# Patient Record
Sex: Male | Born: 1950 | Race: White | Hispanic: No | Marital: Married | Smoking: Never smoker
Health system: Southern US, Community
[De-identification: ages and names within clinical notes are randomized; demographics above are authoritative.]

## PROBLEM LIST (undated history)

## (undated) ENCOUNTER — Encounter

## (undated) ENCOUNTER — Telehealth

## (undated) ENCOUNTER — Encounter: Attending: Foot and Ankle Surgery | Primary: Foot and Ankle Surgery

## (undated) ENCOUNTER — Ambulatory Visit: Attending: Clinical | Primary: Clinical

## (undated) ENCOUNTER — Ambulatory Visit: Payer: Medicare (Managed Care) | Attending: Orthopaedic Surgery | Primary: Orthopaedic Surgery

## (undated) ENCOUNTER — Ambulatory Visit: Payer: MEDICARE | Attending: Clinical | Primary: Clinical

## (undated) ENCOUNTER — Ambulatory Visit: Payer: MEDICARE

## (undated) ENCOUNTER — Ambulatory Visit

## (undated) ENCOUNTER — Telehealth: Attending: Clinical | Primary: Clinical

## (undated) ENCOUNTER — Telehealth: Attending: Adult Health | Primary: Adult Health

## (undated) ENCOUNTER — Encounter: Attending: Family Medicine | Primary: Family Medicine

## (undated) ENCOUNTER — Other Ambulatory Visit

## (undated) ENCOUNTER — Ambulatory Visit: Payer: Medicare (Managed Care)

## (undated) ENCOUNTER — Ambulatory Visit: Payer: MEDICARE | Attending: Cardiovascular Disease | Primary: Cardiovascular Disease

## (undated) ENCOUNTER — Ambulatory Visit: Attending: Nurse Practitioner | Primary: Nurse Practitioner

## (undated) ENCOUNTER — Encounter: Attending: Orthopaedic Surgery | Primary: Orthopaedic Surgery

## (undated) ENCOUNTER — Inpatient Hospital Stay

## (undated) ENCOUNTER — Encounter: Attending: Internal Medicine | Primary: Internal Medicine

## (undated) ENCOUNTER — Ambulatory Visit
Payer: MEDICARE | Attending: Rehabilitative and Restorative Service Providers" | Primary: Rehabilitative and Restorative Service Providers"

## (undated) ENCOUNTER — Ambulatory Visit: Payer: MEDICARE | Attending: Family Medicine | Primary: Family Medicine

## (undated) ENCOUNTER — Encounter: Attending: Adult Health | Primary: Adult Health

## (undated) ENCOUNTER — Telehealth: Attending: Internal Medicine | Primary: Internal Medicine

## (undated) ENCOUNTER — Encounter
Attending: Student in an Organized Health Care Education/Training Program | Primary: Student in an Organized Health Care Education/Training Program

## (undated) ENCOUNTER — Ambulatory Visit: Payer: Medicare (Managed Care) | Attending: Adult Health | Primary: Adult Health

## (undated) ENCOUNTER — Ambulatory Visit: Payer: MEDICARE | Attending: Foot and Ankle Surgery | Primary: Foot and Ankle Surgery

## (undated) ENCOUNTER — Encounter: Attending: Clinical | Primary: Clinical

## (undated) ENCOUNTER — Ambulatory Visit: Payer: MEDICARE | Attending: Ophthalmology | Primary: Ophthalmology

## (undated) ENCOUNTER — Ambulatory Visit
Attending: Thoracic Surgery (Cardiothoracic Vascular Surgery) | Primary: Thoracic Surgery (Cardiothoracic Vascular Surgery)

## (undated) ENCOUNTER — Ambulatory Visit: Payer: MEDICARE | Attending: Internal Medicine | Primary: Internal Medicine

## (undated) ENCOUNTER — Ambulatory Visit
Payer: Medicare (Managed Care) | Attending: Rehabilitative and Restorative Service Providers" | Primary: Rehabilitative and Restorative Service Providers"

## (undated) ENCOUNTER — Institutional Professional Consult (permissible substitution): Payer: MEDICARE

## (undated) ENCOUNTER — Ambulatory Visit: Payer: MEDICARE | Attending: Medical | Primary: Medical

## (undated) ENCOUNTER — Encounter
Attending: Rehabilitative and Restorative Service Providers" | Primary: Rehabilitative and Restorative Service Providers"

## (undated) ENCOUNTER — Encounter: Attending: Ophthalmology | Primary: Ophthalmology

## (undated) ENCOUNTER — Encounter: Attending: Family | Primary: Family

## (undated) ENCOUNTER — Encounter: Attending: Physician Assistant | Primary: Physician Assistant

## (undated) ENCOUNTER — Encounter: Attending: Physical Medicine & Rehabilitation | Primary: Physical Medicine & Rehabilitation

## (undated) DIAGNOSIS — M5137 Other intervertebral disc degeneration, lumbosacral region: Secondary | ICD-10-CM

## (undated) DIAGNOSIS — E785 Hyperlipidemia, unspecified: Secondary | ICD-10-CM

## (undated) DIAGNOSIS — E876 Hypokalemia: Secondary | ICD-10-CM

## (undated) DIAGNOSIS — M51379 Other intervertebral disc degeneration, lumbosacral region without mention of lumbar back pain or lower extremity pain: Secondary | ICD-10-CM

## (undated) DIAGNOSIS — I2510 Atherosclerotic heart disease of native coronary artery without angina pectoris: Secondary | ICD-10-CM

## (undated) DIAGNOSIS — I10 Essential (primary) hypertension: Secondary | ICD-10-CM

## (undated) DIAGNOSIS — R319 Hematuria, unspecified: Secondary | ICD-10-CM

## (undated) DIAGNOSIS — E119 Type 2 diabetes mellitus without complications: Secondary | ICD-10-CM

## (undated) DIAGNOSIS — G4700 Insomnia, unspecified: Secondary | ICD-10-CM

## (undated) HISTORY — DX: Insomnia, unspecified: G47.00

## (undated) HISTORY — DX: Other intervertebral disc degeneration, lumbosacral region: M51.37

## (undated) HISTORY — DX: Hyperlipidemia, unspecified: E78.5

## (undated) HISTORY — PX: FOOT SURGERY: SHX648

## (undated) HISTORY — PX: OTHER SURGICAL HISTORY: SHX169

## (undated) HISTORY — DX: Atherosclerotic heart disease of native coronary artery without angina pectoris: I25.10

## (undated) HISTORY — DX: Hypokalemia: E87.6

## (undated) HISTORY — DX: Essential (primary) hypertension: I10

## (undated) HISTORY — PX: SPINAL FUSION: SHX223

## (undated) HISTORY — DX: Other intervertebral disc degeneration, lumbosacral region without mention of lumbar back pain or lower extremity pain: M51.379

## (undated) HISTORY — PX: SHOULDER SURGERY: SHX246

## (undated) HISTORY — DX: Type 2 diabetes mellitus without complications: E11.9

## (undated) HISTORY — DX: Hematuria, unspecified: R31.9

## (undated) HISTORY — PX: ANGIOPLASTY: SHX39

---
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
## 2004-04-09 ENCOUNTER — Other Ambulatory Visit: Payer: Self-pay

## 2004-04-09 ENCOUNTER — Inpatient Hospital Stay: Payer: Self-pay | Admitting: Internal Medicine

## 2004-04-10 ENCOUNTER — Other Ambulatory Visit: Payer: Self-pay

## 2004-04-11 ENCOUNTER — Other Ambulatory Visit: Payer: Self-pay

## 2006-06-17 ENCOUNTER — Other Ambulatory Visit: Payer: Self-pay

## 2006-06-17 ENCOUNTER — Emergency Department: Payer: Self-pay | Admitting: Internal Medicine

## 2006-07-30 ENCOUNTER — Ambulatory Visit: Payer: Self-pay | Admitting: Pain Medicine

## 2006-08-06 ENCOUNTER — Ambulatory Visit: Payer: Self-pay | Admitting: Pain Medicine

## 2006-09-18 ENCOUNTER — Ambulatory Visit: Payer: Self-pay | Admitting: Pain Medicine

## 2006-09-24 ENCOUNTER — Ambulatory Visit: Payer: Self-pay | Admitting: Pain Medicine

## 2006-10-23 ENCOUNTER — Ambulatory Visit: Payer: Self-pay | Admitting: Pain Medicine

## 2006-10-29 ENCOUNTER — Ambulatory Visit: Payer: Self-pay | Admitting: Pain Medicine

## 2006-11-12 ENCOUNTER — Ambulatory Visit: Payer: Self-pay | Admitting: Pain Medicine

## 2006-11-27 ENCOUNTER — Ambulatory Visit: Payer: Self-pay | Admitting: Pain Medicine

## 2007-01-22 ENCOUNTER — Ambulatory Visit: Payer: Self-pay | Admitting: Pain Medicine

## 2007-02-04 ENCOUNTER — Ambulatory Visit: Payer: Self-pay | Admitting: Pain Medicine

## 2007-02-21 ENCOUNTER — Ambulatory Visit: Payer: Self-pay | Admitting: Pain Medicine

## 2007-03-13 ENCOUNTER — Ambulatory Visit: Payer: Self-pay | Admitting: Pain Medicine

## 2007-04-16 ENCOUNTER — Ambulatory Visit: Payer: Self-pay | Admitting: Pain Medicine

## 2007-05-16 ENCOUNTER — Ambulatory Visit: Payer: Self-pay | Admitting: Pain Medicine

## 2007-06-20 ENCOUNTER — Ambulatory Visit: Payer: Self-pay | Admitting: Pain Medicine

## 2007-07-18 ENCOUNTER — Ambulatory Visit: Payer: Self-pay | Admitting: Pain Medicine

## 2007-09-03 ENCOUNTER — Ambulatory Visit: Payer: Self-pay | Admitting: Pain Medicine

## 2007-10-01 ENCOUNTER — Ambulatory Visit: Payer: Self-pay | Admitting: Pain Medicine

## 2007-10-31 ENCOUNTER — Ambulatory Visit: Payer: Self-pay | Admitting: Pain Medicine

## 2008-03-10 ENCOUNTER — Ambulatory Visit: Payer: Self-pay | Admitting: Specialist

## 2008-03-17 ENCOUNTER — Ambulatory Visit: Payer: Self-pay | Admitting: Specialist

## 2008-09-07 ENCOUNTER — Inpatient Hospital Stay: Payer: Self-pay | Admitting: Internal Medicine

## 2011-02-13 ENCOUNTER — Ambulatory Visit: Payer: Self-pay | Admitting: Orthopedic Surgery

## 2011-03-23 ENCOUNTER — Telehealth: Payer: Self-pay | Admitting: Pulmonary Disease

## 2011-03-23 NOTE — Telephone Encounter (Signed)
Error

## 2011-07-31 ENCOUNTER — Ambulatory Visit: Payer: Self-pay | Admitting: Cardiovascular Disease

## 2011-08-03 ENCOUNTER — Ambulatory Visit: Payer: Self-pay | Admitting: Cardiovascular Disease

## 2011-08-10 ENCOUNTER — Ambulatory Visit: Payer: Self-pay | Admitting: Cardiovascular Disease

## 2012-04-05 ENCOUNTER — Ambulatory Visit: Payer: Self-pay | Admitting: Family Medicine

## 2012-06-27 ENCOUNTER — Ambulatory Visit: Payer: Self-pay | Admitting: Family Medicine

## 2012-08-29 ENCOUNTER — Ambulatory Visit: Payer: Self-pay | Admitting: Family Medicine

## 2012-12-24 ENCOUNTER — Ambulatory Visit: Payer: Self-pay | Admitting: Cardiovascular Disease

## 2012-12-24 LAB — BASIC METABOLIC PANEL
Anion Gap: 7 (ref 7–16)
Calcium, Total: 9.1 mg/dL (ref 8.5–10.1)
Chloride: 105 mmol/L (ref 98–107)
EGFR (Non-African Amer.): >60
Glucose: 123 mg/dL — ABNORMAL HIGH (ref 65–99)
Osmolality: 278 (ref 275–301)
Potassium: 3.8 mmol/L (ref 3.5–5.1)
Sodium: 138 mmol/L (ref 136–145)

## 2012-12-24 LAB — CBC WITH DIFFERENTIAL/PLATELET
Basophil #: 0 10*3/uL (ref 0.0–0.1)
Basophil %: 0.3 %
Eosinophil #: 0.1 10*3/uL (ref 0.0–0.7)
Eosinophil %: 1.3 %
HCT: 41.8 % (ref 40.0–52.0)
HGB: 14.7 g/dL (ref 13.0–18.0)
Lymphocyte %: 25.8 %
MCHC: 35.2 g/dL (ref 32.0–36.0)
MCV: 97 fL (ref 80–100)
Monocyte %: 10.6 %
Neutrophil #: 3.8 10*3/uL (ref 1.4–6.5)
Platelet: 155 10*3/uL (ref 150–440)
RBC: 4.31 10*6/uL — ABNORMAL LOW (ref 4.40–5.90)

## 2013-02-12 ENCOUNTER — Ambulatory Visit: Payer: Self-pay | Admitting: Orthopedic Surgery

## 2013-04-07 ENCOUNTER — Ambulatory Visit: Payer: Self-pay | Admitting: Family Medicine

## 2013-05-12 ENCOUNTER — Emergency Department: Payer: Self-pay | Admitting: Emergency Medicine

## 2013-05-12 LAB — URINALYSIS, COMPLETE
BACTERIA: NONE SEEN
Bilirubin,UR: NEGATIVE
Glucose,UR: NEGATIVE mg/dL (ref 0–75)
Ketone: NEGATIVE
Leukocyte Esterase: NEGATIVE
Nitrite: NEGATIVE
PH: 5 (ref 4.5–8.0)
Protein: NEGATIVE
SPECIFIC GRAVITY: 1.01 (ref 1.003–1.030)
SQUAMOUS EPITHELIAL: NONE SEEN
WBC UR: <1 /HPF (ref 0–5)

## 2013-05-12 LAB — CBC WITH DIFFERENTIAL/PLATELET
BASOS ABS: 0 10*3/uL (ref 0.0–0.1)
Basophil %: 0.3 %
EOS ABS: 0 10*3/uL (ref 0.0–0.7)
Eosinophil %: 0.7 %
HCT: 44.4 % (ref 40.0–52.0)
HGB: 15.1 g/dL (ref 13.0–18.0)
LYMPHS PCT: 25.3 %
Lymphocyte #: 1.8 10*3/uL (ref 1.0–3.6)
MCH: 32.6 pg (ref 26.0–34.0)
MCHC: 34 g/dL (ref 32.0–36.0)
MCV: 96 fL (ref 80–100)
MONOS PCT: 10.8 %
Monocyte #: 0.8 x10 3/mm (ref 0.2–1.0)
NEUTROS PCT: 62.9 %
Neutrophil #: 4.5 10*3/uL (ref 1.4–6.5)
Platelet: 170 10*3/uL (ref 150–440)
RBC: 4.64 10*6/uL (ref 4.40–5.90)
RDW: 13.5 % (ref 11.5–14.5)
WBC: 7.1 10*3/uL (ref 3.8–10.6)

## 2013-05-12 LAB — COMPREHENSIVE METABOLIC PANEL
ALBUMIN: 3.8 g/dL (ref 3.4–5.0)
ALT: 25 U/L (ref 12–78)
Alkaline Phosphatase: 62 U/L
Anion Gap: 6 — ABNORMAL LOW (ref 7–16)
BUN: 13 mg/dL (ref 7–18)
Bilirubin,Total: 0.4 mg/dL (ref 0.2–1.0)
CHLORIDE: 102 mmol/L (ref 98–107)
CO2: 26 mmol/L (ref 21–32)
CREATININE: 1.01 mg/dL (ref 0.60–1.30)
Calcium, Total: 9.7 mg/dL (ref 8.5–10.1)
EGFR (African American): >60
EGFR (Non-African Amer.): >60
Glucose: 125 mg/dL — ABNORMAL HIGH (ref 65–99)
Osmolality: 270 (ref 275–301)
Potassium: 3.6 mmol/L (ref 3.5–5.1)
SGOT(AST): 18 U/L (ref 15–37)
Sodium: 134 mmol/L — ABNORMAL LOW (ref 136–145)
Total Protein: 7.5 g/dL (ref 6.4–8.2)

## 2013-05-12 LAB — LIPASE, BLOOD: Lipase: 160 U/L (ref 73–393)

## 2013-07-08 ENCOUNTER — Ambulatory Visit: Payer: Self-pay | Admitting: Gastroenterology

## 2013-07-10 LAB — PATHOLOGY REPORT

## 2013-10-07 ENCOUNTER — Ambulatory Visit: Payer: Self-pay | Admitting: Otolaryngology

## 2013-11-10 ENCOUNTER — Ambulatory Visit: Payer: Self-pay | Admitting: Specialist

## 2014-01-22 ENCOUNTER — Ambulatory Visit: Payer: Self-pay | Admitting: Internal Medicine

## 2014-10-16 ENCOUNTER — Other Ambulatory Visit: Payer: Self-pay

## 2014-10-16 NOTE — Telephone Encounter (Signed)
Pharmacy faxed request for Rx. Thanks

## 2014-11-25 ENCOUNTER — Other Ambulatory Visit: Payer: Self-pay | Admitting: Family Medicine

## 2015-01-21 ENCOUNTER — Other Ambulatory Visit: Payer: Self-pay | Admitting: Family Medicine

## 2016-01-10 IMAGING — MR MRI OF THE RIGHT SHOULDER WITHOUT CONTRAST
6 series · 40 of 40 positions shown · non-contrast
Comparison: Right shoulder radiographs 04/07/2013.

CLINICAL DATA: Right shoulder pain. History of prior rotator cuff
repair.

EXAM:
MRI OF THE RIGHT SHOULDER WITHOUT CONTRAST
TECHNIQUE: Multiplanar, multisequence MR imaging of the shoulder was performed.
No intravenous contrast was administered.

[Series 3: T2 fat-sat · axial · 4.0mm · 0.62mm/px · z∈[-24,+64]mm · 6 of 21 slices shown (1 of 4)]
[im 1/21]
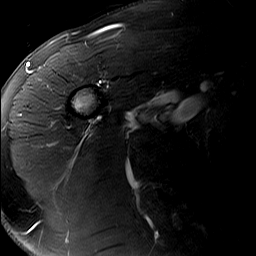
[im 5/21]
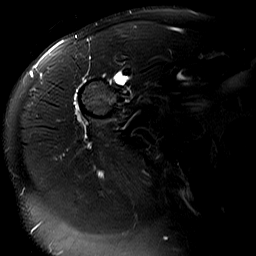
[im 9/21]
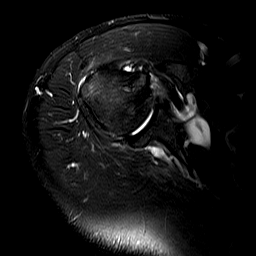
[im 13/21]
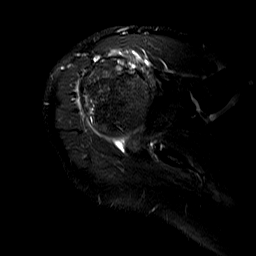
[im 17/21]
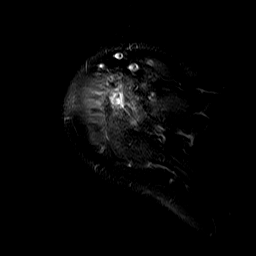
[im 21/21]
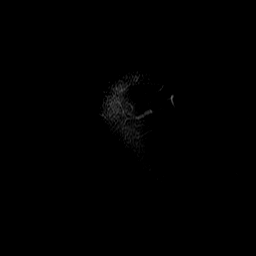

[Series 4: T2 fat-sat · coronal · 4.0mm · 0.62mm/px · 6 of 19 slices shown (2 of 4)]
[im 1/19]
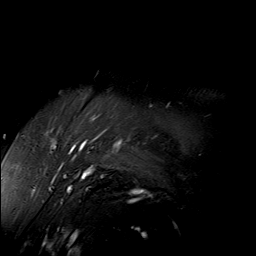
[im 4/19]
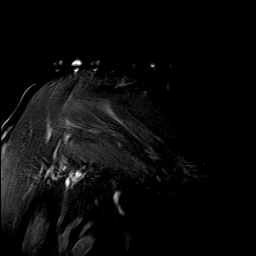
[im 8/19]
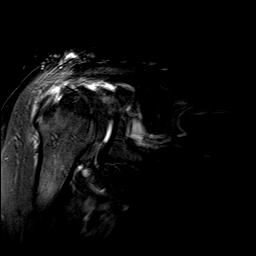
[im 11/19]
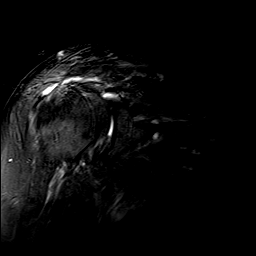
[im 15/19]
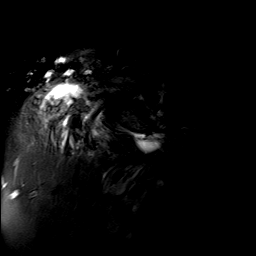
[im 19/19]
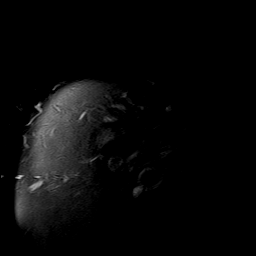

[Series 5: PD · coronal · 4.0mm · 0.62mm/px · 7 of 19 slices shown]
[im 1/19]
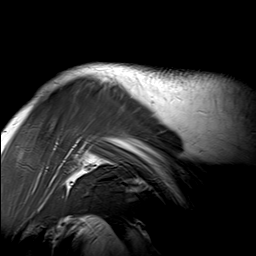
[im 4/19]
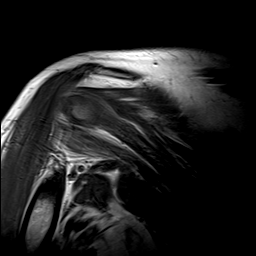
[im 7/19]
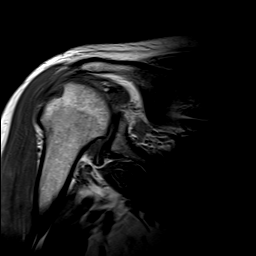
[im 10/19]
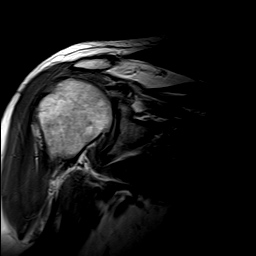
[im 13/19]
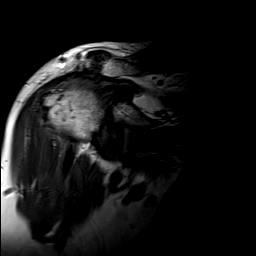
[im 16/19]
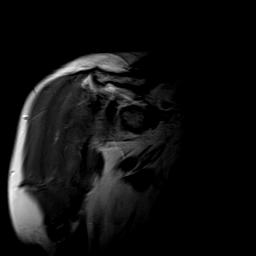
[im 19/19]
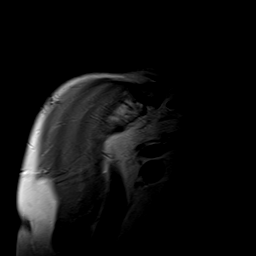

[Series 6: T2 fat-sat · coronal · 4.0mm · 0.62mm/px · 7 of 19 slices shown (3 of 4)]
[im 1/19]
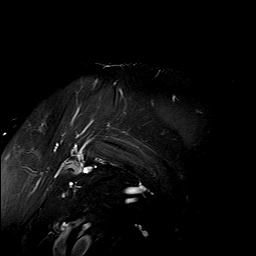
[im 4/19]
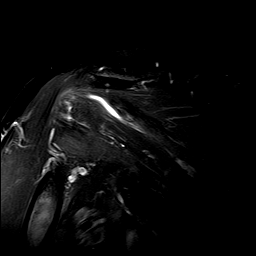
[im 7/19]
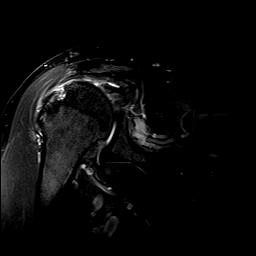
[im 10/19]
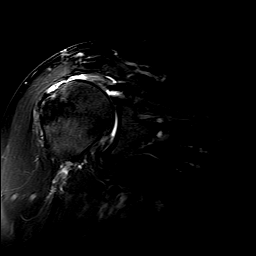
[im 13/19]
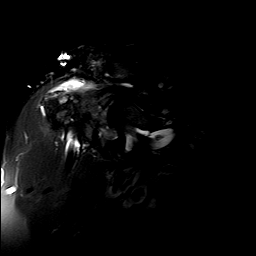
[im 16/19]
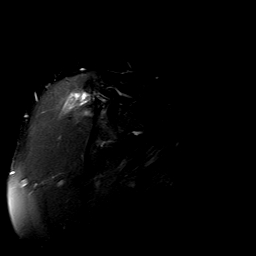
[im 19/19]
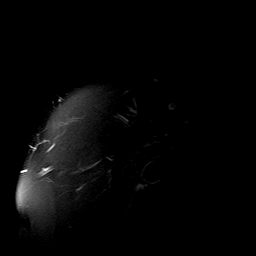

[Series 7: T1 · oblique · 4.0mm · 0.62mm/px · 7 of 19 slices shown]
[im 1/19]
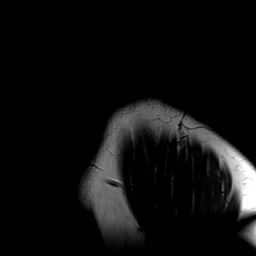
[im 4/19]
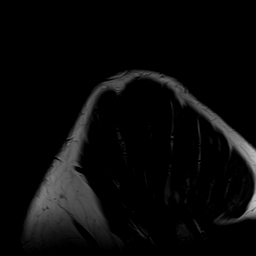
[im 7/19]
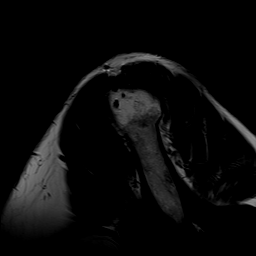
[im 10/19]
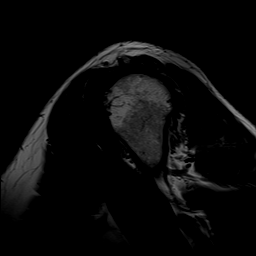
[im 13/19]
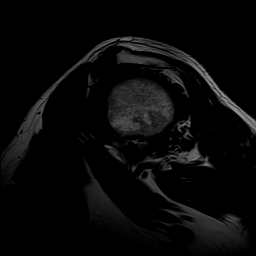
[im 16/19]
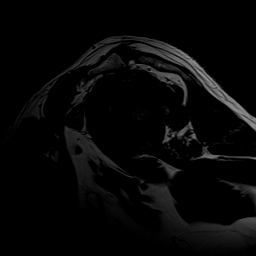
[im 19/19]
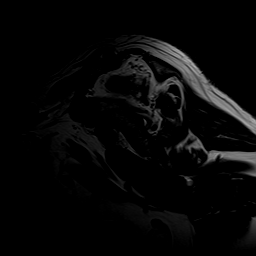

[Series 8: T2 fat-sat · oblique · 4.0mm · 0.62mm/px · 7 of 19 slices shown (4 of 4)]
[im 1/19]
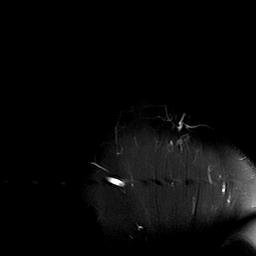
[im 4/19]
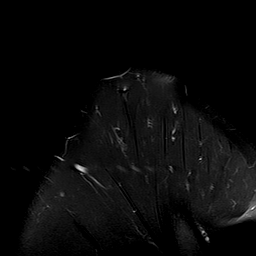
[im 7/19]
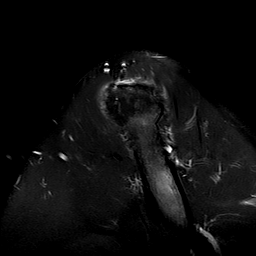
[im 10/19]
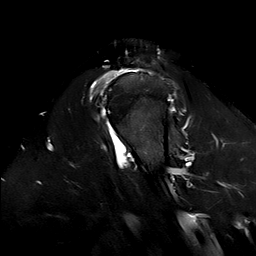
[im 13/19]
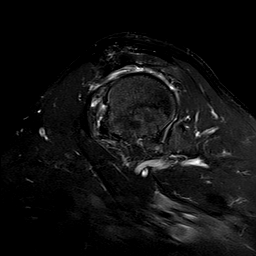
[im 16/19]
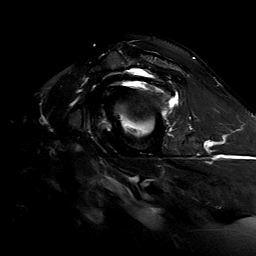
[im 19/19]
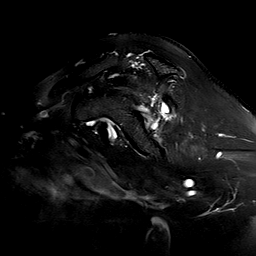

[40 of 40 positions shown; findings below may reference images not displayed]

FINDINGS: Rotator cuff: Large full-thickness retracted rotator cuff tear. This
involves the supraspinatus and infraspinatus tendons. The
subscapularis tendon is partially torn superiorly.

Muscles:  The rotator cuff muscles demonstrate fatty atrophy.

Biceps long head: I do not identify the intra-articular portion of
the long head biceps tendon for certain and I suspect it is torn and
slightly retracted.

Acromioclavicular Joint: Mild degenerative changes. Probable prior
bony decompression without findings for bony impingement.

Glenohumeral Joint: Mild to moderate degenerative changes. Small
joint effusion and moderate synovitis.

Labrum:  Degenerated but no obvious tear.

Bones:  No acute bony findings.
IMPRESSION: 1. Large recurrent retracted rotator cuff tear with approximately
3.5 cm of retraction. There is associated fatty atrophy of the
rotator cuff muscles.
2. Torn and mildly retracted long head biceps tendon.
3. Surgical changes from bony decompression. No findings for bony
impingement.

## 2016-10-26 MED ORDER — METOPROLOL TARTRATE 50 MG TABLET
ORAL_TABLET | 2 refills | 0 days | Status: CP
Start: 2016-10-26 — End: 2016-11-28

## 2016-10-30 ENCOUNTER — Ambulatory Visit
Admission: RE | Admit: 2016-10-30 | Discharge: 2016-10-30 | Disposition: A | Discharge status: Home / Self Care | Payer: MEDICARE

## 2016-10-30 DIAGNOSIS — E118 Type 2 diabetes mellitus with unspecified complications: Secondary | ICD-10-CM

## 2016-10-30 DIAGNOSIS — M549 Dorsalgia, unspecified: Principal | ICD-10-CM

## 2016-10-30 DIAGNOSIS — R0789 Other chest pain: Secondary | ICD-10-CM

## 2016-10-30 DIAGNOSIS — G8929 Other chronic pain: Secondary | ICD-10-CM

## 2016-10-30 DIAGNOSIS — N39498 Other specified urinary incontinence: Secondary | ICD-10-CM

## 2016-10-30 DIAGNOSIS — K1370 Unspecified lesions of oral mucosa: Secondary | ICD-10-CM

## 2016-10-30 DIAGNOSIS — I10 Essential (primary) hypertension: Secondary | ICD-10-CM

## 2016-10-30 MED ORDER — HYDROCODONE 7.5 MG-ACETAMINOPHEN 325 MG TABLET: 1 | tablet | Freq: Four times a day (QID) | 0 refills | 0 days | Status: AC

## 2016-10-30 MED ORDER — DICLOFENAC 1 % TOPICAL GEL
Freq: Four times a day (QID) | TOPICAL | 2 refills | 0 days | Status: CP
Start: 2016-10-30 — End: 2017-03-02

## 2016-10-30 MED ORDER — MUPIROCIN 2 % TOPICAL OINTMENT
Freq: Three times a day (TID) | TOPICAL | 0 refills | 0 days | Status: CP
Start: 2016-10-30 — End: 2016-11-13

## 2016-10-30 MED ORDER — HYDROCODONE 7.5 MG-ACETAMINOPHEN 325 MG TABLET
ORAL_TABLET | Freq: Four times a day (QID) | ORAL | 0 refills | 0.00000 days | Status: CP | PRN
Start: 2016-10-30 — End: 2017-01-18

## 2016-10-30 MED ORDER — ACYCLOVIR 400 MG TABLET
ORAL_TABLET | Freq: Three times a day (TID) | ORAL | 2 refills | 0.00000 days | Status: CP
Start: 2016-10-30 — End: 2016-11-04

## 2016-11-27 MED ORDER — CLOPIDOGREL 75 MG TABLET
ORAL_TABLET | 2 refills | 0 days | Status: CP
Start: 2016-11-27 — End: 2017-03-02

## 2016-11-28 ENCOUNTER — Ambulatory Visit
Admission: RE | Admit: 2016-11-28 | Discharge: 2016-11-28 | Disposition: A | Discharge status: Home / Self Care | Payer: MEDICARE

## 2016-11-28 DIAGNOSIS — Z79899 Other long term (current) drug therapy: Secondary | ICD-10-CM

## 2016-11-28 DIAGNOSIS — E118 Type 2 diabetes mellitus with unspecified complications: Principal | ICD-10-CM

## 2016-11-28 DIAGNOSIS — I10 Essential (primary) hypertension: Secondary | ICD-10-CM

## 2016-11-28 DIAGNOSIS — M549 Dorsalgia, unspecified: Secondary | ICD-10-CM

## 2016-11-28 DIAGNOSIS — G8929 Other chronic pain: Secondary | ICD-10-CM

## 2016-11-28 DIAGNOSIS — B002 Herpesviral gingivostomatitis and pharyngotonsillitis: Secondary | ICD-10-CM

## 2016-11-28 MED ORDER — METOPROLOL TARTRATE 50 MG TABLET
ORAL_TABLET | Freq: Two times a day (BID) | ORAL | 3 refills | 0 days | Status: CP
Start: 2016-11-28 — End: 2017-07-18

## 2016-11-30 MED ORDER — SERTRALINE 100 MG TABLET
ORAL_TABLET | 3 refills | 0 days | Status: CP
Start: 2016-11-30 — End: 2017-12-30

## 2016-12-14 ENCOUNTER — Ambulatory Visit
Admission: RE | Admit: 2016-12-14 | Discharge: 2016-12-14 | Payer: MEDICARE | Attending: Cardiovascular Disease | Admitting: Cardiovascular Disease

## 2016-12-14 DIAGNOSIS — I25119 Atherosclerotic heart disease of native coronary artery with unspecified angina pectoris: Principal | ICD-10-CM

## 2016-12-14 DIAGNOSIS — I10 Essential (primary) hypertension: Secondary | ICD-10-CM

## 2016-12-14 DIAGNOSIS — I5022 Chronic systolic (congestive) heart failure: Secondary | ICD-10-CM

## 2016-12-15 ENCOUNTER — Ambulatory Visit: Admission: RE | Admit: 2016-12-15 | Discharge: 2016-12-15 | Payer: MEDICARE | Attending: Urology

## 2016-12-15 DIAGNOSIS — R3915 Urgency of urination: Secondary | ICD-10-CM

## 2016-12-15 DIAGNOSIS — Z125 Encounter for screening for malignant neoplasm of prostate: Secondary | ICD-10-CM

## 2016-12-15 DIAGNOSIS — R972 Elevated prostate specific antigen [PSA]: Principal | ICD-10-CM

## 2016-12-15 DIAGNOSIS — R32 Unspecified urinary incontinence: Secondary | ICD-10-CM

## 2016-12-15 MED ORDER — MIRABEGRON ER 25 MG TABLET,EXTENDED RELEASE 24 HR
ORAL_TABLET | Freq: Every day | ORAL | 11 refills | 0 days | Status: CP
Start: 2016-12-15 — End: 2017-01-14

## 2017-01-18 MED ORDER — HYDROCODONE 7.5 MG-ACETAMINOPHEN 325 MG TABLET
ORAL_TABLET | Freq: Four times a day (QID) | ORAL | 0 refills | 0 days | Status: CP | PRN
Start: 2017-01-18 — End: 2017-01-25

## 2017-01-29 ENCOUNTER — Ambulatory Visit
Admission: RE | Admit: 2017-01-29 | Discharge: 2017-01-29 | Disposition: A | Discharge status: Home / Self Care | Payer: MEDICARE

## 2017-01-29 DIAGNOSIS — M549 Dorsalgia, unspecified: Principal | ICD-10-CM

## 2017-01-29 DIAGNOSIS — M542 Cervicalgia: Principal | ICD-10-CM

## 2017-01-29 DIAGNOSIS — R32 Unspecified urinary incontinence: Secondary | ICD-10-CM

## 2017-01-29 DIAGNOSIS — R590 Localized enlarged lymph nodes: Secondary | ICD-10-CM

## 2017-01-29 DIAGNOSIS — G8929 Other chronic pain: Secondary | ICD-10-CM

## 2017-01-29 MED ORDER — HYDROCODONE 7.5 MG-ACETAMINOPHEN 325 MG TABLET: 1 | tablet | Freq: Four times a day (QID) | 0 refills | 0 days | Status: AC

## 2017-01-29 MED ORDER — TOLTERODINE ER 2 MG CAPSULE,EXTENDED RELEASE 24 HR
ORAL_CAPSULE | Freq: Every day | ORAL | 0 refills | 0 days | Status: CP
Start: 2017-01-29 — End: 2017-03-02

## 2017-01-29 MED ORDER — HYDROCODONE 7.5 MG-ACETAMINOPHEN 325 MG TABLET
ORAL_TABLET | Freq: Four times a day (QID) | ORAL | 0 refills | 0.00000 days | Status: CP | PRN
Start: 2017-01-29 — End: 2017-01-29

## 2017-02-23 ENCOUNTER — Ambulatory Visit: Admission: RE | Admit: 2017-02-23 | Discharge: 2017-02-23 | Payer: MEDICARE | Attending: Urology | Admitting: Urology

## 2017-02-23 DIAGNOSIS — N401 Enlarged prostate with lower urinary tract symptoms: Principal | ICD-10-CM

## 2017-03-02 ENCOUNTER — Ambulatory Visit
Admission: RE | Admit: 2017-03-02 | Discharge: 2017-03-02 | Disposition: A | Discharge status: Home / Self Care | Payer: MEDICARE

## 2017-03-02 DIAGNOSIS — G8929 Other chronic pain: Secondary | ICD-10-CM

## 2017-03-02 DIAGNOSIS — R590 Localized enlarged lymph nodes: Principal | ICD-10-CM

## 2017-03-02 DIAGNOSIS — M542 Cervicalgia: Secondary | ICD-10-CM

## 2017-03-02 DIAGNOSIS — N39498 Other specified urinary incontinence: Secondary | ICD-10-CM

## 2017-03-02 DIAGNOSIS — E119 Type 2 diabetes mellitus without complications: Secondary | ICD-10-CM

## 2017-03-02 DIAGNOSIS — M549 Dorsalgia, unspecified: Secondary | ICD-10-CM

## 2017-03-02 DIAGNOSIS — M79671 Pain in right foot: Secondary | ICD-10-CM

## 2017-03-02 DIAGNOSIS — E118 Type 2 diabetes mellitus with unspecified complications: Secondary | ICD-10-CM

## 2017-03-02 DIAGNOSIS — I10 Essential (primary) hypertension: Secondary | ICD-10-CM

## 2017-03-02 MED ORDER — CLOPIDOGREL 75 MG TABLET
ORAL_TABLET | Freq: Every day | ORAL | 3 refills | 0 days | Status: CP
Start: 2017-03-02 — End: 2018-05-17

## 2017-03-02 MED ORDER — DICLOFENAC 1 % TOPICAL GEL
Freq: Four times a day (QID) | TOPICAL | 2 refills | 0.00000 days | Status: CP
Start: 2017-03-02 — End: 2017-07-18

## 2017-03-02 MED ORDER — TOLTERODINE ER 2 MG CAPSULE,EXTENDED RELEASE 24 HR
ORAL_CAPSULE | Freq: Every day | ORAL | 3 refills | 0 days | Status: CP
Start: 2017-03-02 — End: 2017-07-21

## 2017-03-02 MED ORDER — HYDROCODONE 7.5 MG-ACETAMINOPHEN 325 MG TABLET
ORAL_TABLET | Freq: Four times a day (QID) | ORAL | 0 refills | 0 days | Status: CP | PRN
Start: 2017-03-02 — End: 2017-04-24

## 2017-03-13 MED ORDER — LOSARTAN 100 MG-HYDROCHLOROTHIAZIDE 25 MG TABLET
ORAL_TABLET | 1 refills | 0 days | Status: CP
Start: 2017-03-13 — End: 2017-07-18

## 2017-03-13 MED ORDER — OMEPRAZOLE 20 MG CAPSULE,DELAYED RELEASE
ORAL_CAPSULE | 1 refills | 0 days | Status: CP
Start: 2017-03-13 — End: 2017-09-01

## 2017-03-14 ENCOUNTER — Ambulatory Visit: Admission: RE | Admit: 2017-03-14 | Discharge: 2017-03-14

## 2017-03-14 DIAGNOSIS — E118 Type 2 diabetes mellitus with unspecified complications: Principal | ICD-10-CM

## 2017-03-14 DIAGNOSIS — H3589 Other specified retinal disorders: Secondary | ICD-10-CM

## 2017-03-23 MED ORDER — TRAZODONE 150 MG TABLET
ORAL_TABLET | Freq: Every evening | ORAL | 2 refills | 0.00000 days | Status: CP
Start: 2017-03-23 — End: 2018-02-17

## 2017-04-25 ENCOUNTER — Ambulatory Visit: Admit: 2017-04-25 | Discharge: 2017-04-25 | Payer: MEDICARE

## 2017-04-25 DIAGNOSIS — R1032 Left lower quadrant pain: Principal | ICD-10-CM

## 2017-04-25 DIAGNOSIS — I10 Essential (primary) hypertension: Principal | ICD-10-CM

## 2017-04-25 DIAGNOSIS — M549 Dorsalgia, unspecified: Secondary | ICD-10-CM

## 2017-04-25 DIAGNOSIS — E118 Type 2 diabetes mellitus with unspecified complications: Secondary | ICD-10-CM

## 2017-04-25 DIAGNOSIS — G8929 Other chronic pain: Secondary | ICD-10-CM

## 2017-04-25 DIAGNOSIS — M79671 Pain in right foot: Secondary | ICD-10-CM

## 2017-04-25 DIAGNOSIS — R0789 Other chest pain: Secondary | ICD-10-CM

## 2017-04-25 DIAGNOSIS — R0602 Shortness of breath: Secondary | ICD-10-CM

## 2017-04-25 MED ORDER — HYDROCODONE 7.5 MG-ACETAMINOPHEN 325 MG TABLET: 1 | tablet | Freq: Four times a day (QID) | 0 refills | 0 days | Status: AC

## 2017-04-25 MED ORDER — HYDROCODONE 7.5 MG-ACETAMINOPHEN 325 MG TABLET
ORAL_TABLET | Freq: Four times a day (QID) | ORAL | 0 refills | 0.00000 days | Status: CP | PRN
Start: 2017-04-25 — End: 2017-05-21

## 2017-05-21 ENCOUNTER — Ambulatory Visit
Admit: 2017-05-21 | Discharge: 2017-05-22 | Payer: MEDICARE | Attending: Foot and Ankle Surgery | Primary: Foot and Ankle Surgery

## 2017-05-21 DIAGNOSIS — M7751 Other enthesopathy of right foot: Principal | ICD-10-CM

## 2017-05-23 ENCOUNTER — Ambulatory Visit: Admit: 2017-05-23 | Discharge: 2017-05-24 | Payer: MEDICARE

## 2017-05-23 DIAGNOSIS — R1032 Left lower quadrant pain: Secondary | ICD-10-CM

## 2017-05-23 DIAGNOSIS — R0789 Other chest pain: Secondary | ICD-10-CM

## 2017-05-23 DIAGNOSIS — M549 Dorsalgia, unspecified: Secondary | ICD-10-CM

## 2017-05-23 DIAGNOSIS — G8929 Other chronic pain: Secondary | ICD-10-CM

## 2017-05-23 DIAGNOSIS — E118 Type 2 diabetes mellitus with unspecified complications: Secondary | ICD-10-CM

## 2017-05-23 DIAGNOSIS — I10 Essential (primary) hypertension: Principal | ICD-10-CM

## 2017-05-23 DIAGNOSIS — R0602 Shortness of breath: Secondary | ICD-10-CM

## 2017-06-01 ENCOUNTER — Ambulatory Visit: Admit: 2017-06-01 | Discharge: 2017-06-02 | Payer: MEDICARE

## 2017-06-01 DIAGNOSIS — M25572 Pain in left ankle and joints of left foot: Secondary | ICD-10-CM

## 2017-06-01 DIAGNOSIS — M25571 Pain in right ankle and joints of right foot: Principal | ICD-10-CM

## 2017-06-01 DIAGNOSIS — S86002A Unspecified injury of left Achilles tendon, initial encounter: Secondary | ICD-10-CM

## 2017-06-05 MED ORDER — BLOOD SUGAR DIAGNOSTIC STRIPS
Freq: Every day | 0 refills | 0 days | Status: CP
Start: 2017-06-05 — End: 2017-06-06

## 2017-06-05 MED ORDER — LANCETS 31 GAUGE
Freq: Every day | 0 refills | 0.00000 days | Status: CP
Start: 2017-06-05 — End: 2017-06-06

## 2017-06-05 MED ORDER — BLOOD-GLUCOSE METER
Freq: Every day | 0 refills | 0 days | Status: CP
Start: 2017-06-05 — End: 2017-06-06

## 2017-06-06 MED ORDER — LANCETS 31 GAUGE
Freq: Every day | INTRADERMAL | 0 refills | 0 days | Status: CP
Start: 2017-06-06 — End: 2017-08-31

## 2017-06-06 MED ORDER — BLOOD-GLUCOSE METER
Freq: Every day | INTRADERMAL | 0 refills | 0.00000 days | Status: CP
Start: 2017-06-06 — End: 2018-03-18

## 2017-06-06 MED ORDER — BLOOD SUGAR DIAGNOSTIC STRIPS
Freq: Every day | 0 refills | 0 days | Status: CP
Start: 2017-06-06 — End: 2017-11-26

## 2017-06-12 ENCOUNTER — Ambulatory Visit: Admit: 2017-06-12 | Discharge: 2017-06-13 | Payer: MEDICARE

## 2017-06-12 DIAGNOSIS — S86002D Unspecified injury of left Achilles tendon, subsequent encounter: Secondary | ICD-10-CM

## 2017-06-12 DIAGNOSIS — S86002A Unspecified injury of left Achilles tendon, initial encounter: Principal | ICD-10-CM

## 2017-06-19 ENCOUNTER — Ambulatory Visit
Admit: 2017-06-19 | Discharge: 2017-06-20 | Payer: MEDICARE | Attending: Cardiovascular Disease | Primary: Cardiovascular Disease

## 2017-06-19 DIAGNOSIS — I10 Essential (primary) hypertension: Secondary | ICD-10-CM

## 2017-06-19 DIAGNOSIS — Z9861 Coronary angioplasty status: Secondary | ICD-10-CM

## 2017-06-19 DIAGNOSIS — I2510 Atherosclerotic heart disease of native coronary artery without angina pectoris: Secondary | ICD-10-CM

## 2017-06-19 DIAGNOSIS — I25119 Atherosclerotic heart disease of native coronary artery with unspecified angina pectoris: Principal | ICD-10-CM

## 2017-06-26 ENCOUNTER — Ambulatory Visit
Admit: 2017-06-26 | Discharge: 2017-06-27 | Payer: MEDICARE | Attending: Foot and Ankle Surgery | Primary: Foot and Ankle Surgery

## 2017-06-26 DIAGNOSIS — M216X2 Other acquired deformities of left foot: Principal | ICD-10-CM

## 2017-06-27 ENCOUNTER — Ambulatory Visit: Admit: 2017-06-27 | Discharge: 2017-06-27 | Payer: MEDICARE

## 2017-06-27 DIAGNOSIS — I2510 Atherosclerotic heart disease of native coronary artery without angina pectoris: Principal | ICD-10-CM

## 2017-06-27 DIAGNOSIS — I509 Heart failure, unspecified: Secondary | ICD-10-CM

## 2017-07-09 MED ORDER — FLUTICASONE PROPIONATE 50 MCG/ACTUATION NASAL SPRAY,SUSPENSION
Freq: Every day | NASAL | 7 refills | 0.00000 days | Status: CP | PRN
Start: 2017-07-09 — End: 2017-11-07

## 2017-07-18 ENCOUNTER — Ambulatory Visit: Admit: 2017-07-18 | Discharge: 2017-07-19 | Payer: MEDICARE

## 2017-07-18 DIAGNOSIS — M549 Dorsalgia, unspecified: Principal | ICD-10-CM

## 2017-07-18 DIAGNOSIS — E118 Type 2 diabetes mellitus with unspecified complications: Secondary | ICD-10-CM

## 2017-07-18 DIAGNOSIS — G8929 Other chronic pain: Secondary | ICD-10-CM

## 2017-07-18 DIAGNOSIS — L989 Disorder of the skin and subcutaneous tissue, unspecified: Secondary | ICD-10-CM

## 2017-07-18 DIAGNOSIS — I10 Essential (primary) hypertension: Secondary | ICD-10-CM

## 2017-07-18 MED ORDER — DICLOFENAC 1 % TOPICAL GEL
Freq: Four times a day (QID) | TOPICAL | 2 refills | 0 days | Status: CP
Start: 2017-07-18 — End: 2018-07-24

## 2017-07-18 MED ORDER — HYDROCODONE 7.5 MG-ACETAMINOPHEN 325 MG TABLET: 1 | tablet | Freq: Four times a day (QID) | 0 refills | 0 days | Status: AC

## 2017-07-18 MED ORDER — HYDROCHLOROTHIAZIDE 25 MG TABLET
ORAL_TABLET | Freq: Every day | ORAL | 3 refills | 0 days | Status: CP
Start: 2017-07-18 — End: 2017-08-29

## 2017-07-18 MED ORDER — HYDROCODONE 7.5 MG-ACETAMINOPHEN 325 MG TABLET
ORAL_TABLET | Freq: Four times a day (QID) | ORAL | 0 refills | 0.00000 days | Status: CP | PRN
Start: 2017-07-18 — End: 2017-11-07

## 2017-07-18 MED ORDER — METOPROLOL TARTRATE 50 MG TABLET
ORAL_TABLET | Freq: Two times a day (BID) | ORAL | 3 refills | 0 days | Status: CP
Start: 2017-07-18 — End: 2017-11-09

## 2017-07-19 DIAGNOSIS — M216X2 Other acquired deformities of left foot: Principal | ICD-10-CM

## 2017-07-20 ENCOUNTER — Ambulatory Visit: Admit: 2017-07-20 | Discharge: 2017-07-21 | Payer: MEDICARE

## 2017-07-20 ENCOUNTER — Encounter: Admit: 2017-07-20 | Discharge: 2017-07-21 | Payer: MEDICARE | Attending: Anesthesiology | Primary: Anesthesiology

## 2017-07-20 DIAGNOSIS — M216X2 Other acquired deformities of left foot: Principal | ICD-10-CM

## 2017-07-20 MED ORDER — ACETAMINOPHEN 500 MG TABLET: 1000 mg | tablet | Freq: Three times a day (TID) | 0 refills | 0 days | Status: AC

## 2017-07-20 MED ORDER — DOCUSATE SODIUM 100 MG CAPSULE
ORAL_CAPSULE | Freq: Two times a day (BID) | ORAL | 0 refills | 0.00000 days | Status: CP
Start: 2017-07-20 — End: 2017-07-20

## 2017-07-20 MED ORDER — ERGOCALCIFEROL (VITAMIN D2) 1,250 MCG (50,000 UNIT) CAPSULE: capsule | 0 refills | 0 days

## 2017-07-20 MED ORDER — PROMETHAZINE 12.5 MG TABLET
ORAL_TABLET | Freq: Four times a day (QID) | ORAL | 0 refills | 0.00000 days | Status: CP | PRN
Start: 2017-07-20 — End: 2017-07-20

## 2017-07-20 MED ORDER — OXYCODONE 5 MG TABLET
ORAL_TABLET | ORAL | 0 refills | 0.00000 days | Status: CP | PRN
Start: 2017-07-20 — End: 2017-08-08

## 2017-07-20 MED ORDER — GABAPENTIN 100 MG CAPSULE
ORAL_CAPSULE | Freq: Three times a day (TID) | ORAL | 0 refills | 0.00000 days | Status: CP
Start: 2017-07-20 — End: 2017-07-20

## 2017-07-20 MED ORDER — DOCUSATE SODIUM 100 MG CAPSULE: 100 mg | capsule | Freq: Two times a day (BID) | 0 refills | 0 days | Status: AC

## 2017-07-20 MED ORDER — ERGOCALCIFEROL (VITAMIN D2) 1,250 MCG (50,000 UNIT) CAPSULE
ORAL_CAPSULE | ORAL | 0 refills | 0.00000 days | Status: CP
Start: 2017-07-20 — End: 2017-07-20

## 2017-07-20 MED ORDER — GABAPENTIN 100 MG CAPSULE: 100 mg | capsule | Freq: Three times a day (TID) | 0 refills | 0 days | Status: AC

## 2017-07-20 MED ORDER — OXYCODONE 5 MG TABLET: 5 mg | tablet | 0 refills | 0 days | Status: AC

## 2017-07-20 MED ORDER — ASCORBIC ACID (VITAMIN C) 500 MG CHEWABLE TABLET: 500 mg | each | 0 refills | 0 days | Status: AC

## 2017-07-20 MED ORDER — ASCORBIC ACID (VITAMIN C) 500 MG CHEWABLE TABLET
ORAL | 0 refills | 0.00000 days | Status: CP
Start: 2017-07-20 — End: 2017-08-19

## 2017-07-20 MED ORDER — ASCORBIC ACID (VITAMIN C) 500 MG TABLET
ORAL | 0 refills | 0 days
Start: 2017-07-20 — End: 2018-01-12

## 2017-07-20 MED ORDER — ACETAMINOPHEN 500 MG TABLET: each | 0 refills | 0 days

## 2017-07-20 MED ORDER — PROMETHAZINE 12.5 MG TABLET: tablet | Freq: Four times a day (QID) | 0 refills | 0 days | Status: AC

## 2017-07-20 MED ORDER — ACETAMINOPHEN 500 MG TABLET
ORAL_TABLET | Freq: Three times a day (TID) | ORAL | 0 refills | 0.00000 days | Status: CP
Start: 2017-07-20 — End: 2017-07-20

## 2017-07-20 MED ORDER — ERGOCALCIFEROL (VITAMIN D2) 1,250 MCG (50,000 UNIT) CAPSULE: 50000 [IU] | capsule | 0 refills | 0 days | Status: AC

## 2017-07-20 MED ORDER — DOCUSATE SODIUM 100 MG CAPSULE: each | 0 refills | 0 days

## 2017-07-31 ENCOUNTER — Ambulatory Visit
Admit: 2017-07-31 | Discharge: 2017-08-01 | Payer: MEDICARE | Attending: Foot and Ankle Surgery | Primary: Foot and Ankle Surgery

## 2017-07-31 DIAGNOSIS — Z9889 Other specified postprocedural states: Principal | ICD-10-CM

## 2017-08-08 ENCOUNTER — Ambulatory Visit: Admit: 2017-08-08 | Discharge: 2017-08-09 | Attending: Pharmacist | Primary: Pharmacist

## 2017-08-08 DIAGNOSIS — E559 Vitamin D deficiency, unspecified: Secondary | ICD-10-CM

## 2017-08-08 DIAGNOSIS — N39498 Other specified urinary incontinence: Secondary | ICD-10-CM

## 2017-08-08 DIAGNOSIS — E782 Mixed hyperlipidemia: Secondary | ICD-10-CM

## 2017-08-08 DIAGNOSIS — K219 Gastro-esophageal reflux disease without esophagitis: Secondary | ICD-10-CM

## 2017-08-08 DIAGNOSIS — I5022 Chronic systolic (congestive) heart failure: Secondary | ICD-10-CM

## 2017-08-08 DIAGNOSIS — G8929 Other chronic pain: Secondary | ICD-10-CM

## 2017-08-08 DIAGNOSIS — F329 Major depressive disorder, single episode, unspecified: Secondary | ICD-10-CM

## 2017-08-08 DIAGNOSIS — M549 Dorsalgia, unspecified: Secondary | ICD-10-CM

## 2017-08-08 DIAGNOSIS — E118 Type 2 diabetes mellitus with unspecified complications: Principal | ICD-10-CM

## 2017-08-08 DIAGNOSIS — F5101 Primary insomnia: Secondary | ICD-10-CM

## 2017-08-08 DIAGNOSIS — I10 Essential (primary) hypertension: Secondary | ICD-10-CM

## 2017-08-08 DIAGNOSIS — I25119 Atherosclerotic heart disease of native coronary artery with unspecified angina pectoris: Secondary | ICD-10-CM

## 2017-08-11 MED ORDER — FLUCONAZOLE 150 MG TABLET
ORAL_TABLET | 0 refills | 0 days | Status: CP
Start: 2017-08-11 — End: 2017-11-07

## 2017-08-21 ENCOUNTER — Ambulatory Visit: Admit: 2017-08-21 | Discharge: 2017-08-22 | Payer: MEDICARE

## 2017-08-21 DIAGNOSIS — M216X2 Other acquired deformities of left foot: Principal | ICD-10-CM

## 2017-08-22 MED ORDER — SILVER SULFADIAZINE 1 % TOPICAL CREAM
Freq: Every day | TOPICAL | 0 refills | 0 days | Status: CP
Start: 2017-08-22 — End: 2017-11-07

## 2017-08-29 ENCOUNTER — Ambulatory Visit: Admit: 2017-08-29 | Discharge: 2017-08-30 | Payer: MEDICARE

## 2017-08-29 DIAGNOSIS — E559 Vitamin D deficiency, unspecified: Secondary | ICD-10-CM

## 2017-08-29 DIAGNOSIS — E782 Mixed hyperlipidemia: Secondary | ICD-10-CM

## 2017-08-29 DIAGNOSIS — E118 Type 2 diabetes mellitus with unspecified complications: Secondary | ICD-10-CM

## 2017-08-29 DIAGNOSIS — I10 Essential (primary) hypertension: Principal | ICD-10-CM

## 2017-08-29 DIAGNOSIS — G8929 Other chronic pain: Secondary | ICD-10-CM

## 2017-08-29 DIAGNOSIS — M549 Dorsalgia, unspecified: Secondary | ICD-10-CM

## 2017-08-29 MED ORDER — LISINOPRIL 10 MG TABLET
ORAL_TABLET | Freq: Every day | ORAL | 3 refills | 0.00000 days | Status: CP
Start: 2017-08-29 — End: 2017-09-25

## 2017-08-31 MED ORDER — ONETOUCH DELICA LANCETS 30 GAUGE
1 refills | 0 days | Status: CP
Start: 2017-08-31 — End: ?

## 2017-09-03 MED ORDER — OMEPRAZOLE 20 MG CAPSULE,DELAYED RELEASE
ORAL_CAPSULE | 0 refills | 0 days | Status: CP
Start: 2017-09-03 — End: 2017-12-01

## 2017-09-06 MED ORDER — GABAPENTIN 100 MG CAPSULE
ORAL_CAPSULE | Freq: Three times a day (TID) | ORAL | 0 refills | 0 days | Status: CP
Start: 2017-09-06 — End: 2017-11-07

## 2017-09-07 ENCOUNTER — Ambulatory Visit: Admit: 2017-09-07 | Discharge: 2017-09-08 | Payer: MEDICARE

## 2017-09-07 DIAGNOSIS — L989 Disorder of the skin and subcutaneous tissue, unspecified: Secondary | ICD-10-CM

## 2017-09-07 DIAGNOSIS — L570 Actinic keratosis: Principal | ICD-10-CM

## 2017-09-07 DIAGNOSIS — L578 Other skin changes due to chronic exposure to nonionizing radiation: Secondary | ICD-10-CM

## 2017-09-07 DIAGNOSIS — L821 Other seborrheic keratosis: Secondary | ICD-10-CM

## 2017-09-13 ENCOUNTER — Ambulatory Visit: Admit: 2017-09-13 | Discharge: 2017-09-14 | Payer: MEDICARE | Attending: Family Medicine | Primary: Family Medicine

## 2017-09-13 DIAGNOSIS — I10 Essential (primary) hypertension: Principal | ICD-10-CM

## 2017-09-13 MED ORDER — HYDROCHLOROTHIAZIDE 25 MG TABLET
ORAL_TABLET | Freq: Every day | ORAL | 3 refills | 0.00000 days | Status: CP
Start: 2017-09-13 — End: 2017-10-08

## 2017-09-25 ENCOUNTER — Ambulatory Visit: Admit: 2017-09-25 | Discharge: 2017-09-26 | Payer: MEDICARE

## 2017-09-25 DIAGNOSIS — R0789 Other chest pain: Secondary | ICD-10-CM

## 2017-09-25 DIAGNOSIS — R05 Cough: Secondary | ICD-10-CM

## 2017-09-25 DIAGNOSIS — I10 Essential (primary) hypertension: Principal | ICD-10-CM

## 2017-09-25 MED ORDER — IRBESARTAN 150 MG TABLET
ORAL_TABLET | Freq: Every day | ORAL | 0 refills | 0 days | Status: CP
Start: 2017-09-25 — End: 2017-10-15

## 2017-09-25 MED ORDER — OLMESARTAN 20 MG TABLET
ORAL_TABLET | Freq: Every day | ORAL | 1 refills | 0.00000 days | Status: CP
Start: 2017-09-25 — End: 2017-11-07

## 2017-09-28 MED ORDER — METFORMIN 500 MG TABLET
ORAL_TABLET | 1 refills | 0 days | Status: CP
Start: 2017-09-28 — End: 2017-12-11

## 2017-10-01 ENCOUNTER — Ambulatory Visit: Admit: 2017-10-01 | Discharge: 2017-10-02 | Payer: MEDICARE

## 2017-10-01 DIAGNOSIS — S86002A Unspecified injury of left Achilles tendon, initial encounter: Principal | ICD-10-CM

## 2017-10-01 DIAGNOSIS — M216X2 Other acquired deformities of left foot: Principal | ICD-10-CM

## 2017-10-08 MED ORDER — HYDROCHLOROTHIAZIDE 50 MG TABLET
ORAL_TABLET | Freq: Every day | ORAL | 3 refills | 0.00000 days | Status: CP
Start: 2017-10-08 — End: 2018-01-12

## 2017-10-15 MED ORDER — IRBESARTAN 150 MG TABLET
ORAL_TABLET | Freq: Every day | ORAL | 0 refills | 0.00000 days | Status: CP
Start: 2017-10-15 — End: 2017-10-18

## 2017-10-18 MED ORDER — IRBESARTAN 150 MG TABLET
ORAL_TABLET | Freq: Every day | ORAL | 3 refills | 0 days | Status: CP
Start: 2017-10-18 — End: 2017-11-19

## 2017-11-07 ENCOUNTER — Ambulatory Visit: Admit: 2017-11-07 | Discharge: 2017-11-08 | Attending: Pharmacist | Primary: Pharmacist

## 2017-11-07 DIAGNOSIS — E118 Type 2 diabetes mellitus with unspecified complications: Secondary | ICD-10-CM

## 2017-11-07 DIAGNOSIS — I5022 Chronic systolic (congestive) heart failure: Principal | ICD-10-CM

## 2017-11-07 DIAGNOSIS — G8929 Other chronic pain: Secondary | ICD-10-CM

## 2017-11-07 DIAGNOSIS — E559 Vitamin D deficiency, unspecified: Secondary | ICD-10-CM

## 2017-11-07 DIAGNOSIS — I25119 Atherosclerotic heart disease of native coronary artery with unspecified angina pectoris: Secondary | ICD-10-CM

## 2017-11-07 DIAGNOSIS — F5101 Primary insomnia: Secondary | ICD-10-CM

## 2017-11-07 DIAGNOSIS — K219 Gastro-esophageal reflux disease without esophagitis: Secondary | ICD-10-CM

## 2017-11-07 DIAGNOSIS — F329 Major depressive disorder, single episode, unspecified: Secondary | ICD-10-CM

## 2017-11-07 DIAGNOSIS — M549 Dorsalgia, unspecified: Secondary | ICD-10-CM

## 2017-11-07 DIAGNOSIS — N39498 Other specified urinary incontinence: Secondary | ICD-10-CM

## 2017-11-07 DIAGNOSIS — E782 Mixed hyperlipidemia: Secondary | ICD-10-CM

## 2017-11-07 DIAGNOSIS — I10 Essential (primary) hypertension: Secondary | ICD-10-CM

## 2017-11-07 DIAGNOSIS — L989 Disorder of the skin and subcutaneous tissue, unspecified: Secondary | ICD-10-CM

## 2017-11-09 ENCOUNTER — Ambulatory Visit: Admit: 2017-11-09 | Discharge: 2017-11-10 | Payer: MEDICARE

## 2017-11-09 DIAGNOSIS — M549 Dorsalgia, unspecified: Principal | ICD-10-CM

## 2017-11-09 DIAGNOSIS — G8929 Other chronic pain: Secondary | ICD-10-CM

## 2017-11-09 DIAGNOSIS — I10 Essential (primary) hypertension: Secondary | ICD-10-CM

## 2017-11-09 DIAGNOSIS — R05 Cough: Secondary | ICD-10-CM

## 2017-11-09 DIAGNOSIS — E118 Type 2 diabetes mellitus with unspecified complications: Secondary | ICD-10-CM

## 2017-11-09 DIAGNOSIS — N39498 Other specified urinary incontinence: Secondary | ICD-10-CM

## 2017-11-09 DIAGNOSIS — R0789 Other chest pain: Secondary | ICD-10-CM

## 2017-11-09 MED ORDER — HYDROCODONE 7.5 MG-ACETAMINOPHEN 325 MG TABLET
ORAL_TABLET | Freq: Four times a day (QID) | ORAL | 0 refills | 0.00000 days | Status: CP | PRN
Start: 2017-11-09 — End: 2018-01-08

## 2017-11-09 MED ORDER — OXYBUTYNIN CHLORIDE ER 5 MG TABLET,EXTENDED RELEASE 24 HR
ORAL_TABLET | Freq: Every day | ORAL | 2 refills | 0 days | Status: CP
Start: 2017-11-09 — End: 2018-01-12

## 2017-11-09 MED ORDER — HYDROCODONE 7.5 MG-ACETAMINOPHEN 325 MG TABLET: 1 | tablet | Freq: Four times a day (QID) | 0 refills | 0 days | Status: AC

## 2017-11-09 MED ORDER — METOPROLOL SUCCINATE ER 100 MG TABLET,EXTENDED RELEASE 24 HR
ORAL_TABLET | Freq: Every day | ORAL | 3 refills | 0.00000 days | Status: CP
Start: 2017-11-09 — End: 2018-10-15

## 2017-11-19 MED ORDER — IRBESARTAN 150 MG TABLET: 150 mg | tablet | 3 refills | 0 days | Status: AC

## 2017-11-19 MED ORDER — IRBESARTAN 150 MG TABLET
ORAL_TABLET | Freq: Every day | ORAL | 3 refills | 0.00000 days | Status: CP
Start: 2017-11-19 — End: 2018-10-07

## 2017-11-26 MED ORDER — ONETOUCH ULTRA BLUE TEST STRIP
ORAL_STRIP | 5 refills | 0 days | Status: CP
Start: 2017-11-26 — End: 2018-02-08

## 2017-12-03 MED ORDER — OMEPRAZOLE 20 MG CAPSULE,DELAYED RELEASE
ORAL_CAPSULE | 1 refills | 0 days | Status: CP
Start: 2017-12-03 — End: 2018-03-18

## 2017-12-11 ENCOUNTER — Ambulatory Visit: Admit: 2017-12-11 | Discharge: 2017-12-11 | Payer: MEDICARE

## 2017-12-11 DIAGNOSIS — R05 Cough: Secondary | ICD-10-CM

## 2017-12-11 DIAGNOSIS — I10 Essential (primary) hypertension: Secondary | ICD-10-CM

## 2017-12-11 DIAGNOSIS — E118 Type 2 diabetes mellitus with unspecified complications: Principal | ICD-10-CM

## 2017-12-11 DIAGNOSIS — F322 Major depressive disorder, single episode, severe without psychotic features: Secondary | ICD-10-CM

## 2017-12-11 DIAGNOSIS — R109 Unspecified abdominal pain: Secondary | ICD-10-CM

## 2017-12-11 MED ORDER — METFORMIN 1,000 MG TABLET
ORAL_TABLET | Freq: Two times a day (BID) | ORAL | 3 refills | 0 days | Status: CP
Start: 2017-12-11 — End: 2018-11-21

## 2017-12-26 ENCOUNTER — Ambulatory Visit
Admit: 2017-12-26 | Discharge: 2017-12-27 | Payer: MEDICARE | Attending: Cardiovascular Disease | Primary: Cardiovascular Disease

## 2017-12-26 DIAGNOSIS — I5022 Chronic systolic (congestive) heart failure: Secondary | ICD-10-CM

## 2017-12-26 DIAGNOSIS — I25119 Atherosclerotic heart disease of native coronary artery with unspecified angina pectoris: Principal | ICD-10-CM

## 2017-12-26 DIAGNOSIS — I10 Essential (primary) hypertension: Secondary | ICD-10-CM

## 2017-12-31 MED ORDER — SERTRALINE 100 MG TABLET
ORAL_TABLET | 3 refills | 0 days | Status: CP
Start: 2017-12-31 — End: 2018-11-18

## 2018-01-01 ENCOUNTER — Ambulatory Visit
Admit: 2018-01-01 | Discharge: 2018-01-02 | Payer: MEDICARE | Attending: Foot and Ankle Surgery | Primary: Foot and Ankle Surgery

## 2018-01-01 ENCOUNTER — Ambulatory Visit: Admit: 2018-01-01 | Discharge: 2018-01-02 | Payer: MEDICARE

## 2018-01-01 DIAGNOSIS — M216X2 Other acquired deformities of left foot: Principal | ICD-10-CM

## 2018-01-11 ENCOUNTER — Ambulatory Visit: Admit: 2018-01-11 | Discharge: 2018-01-12 | Payer: MEDICARE

## 2018-01-11 DIAGNOSIS — E118 Type 2 diabetes mellitus with unspecified complications: Secondary | ICD-10-CM

## 2018-01-11 DIAGNOSIS — R109 Unspecified abdominal pain: Principal | ICD-10-CM

## 2018-01-11 DIAGNOSIS — M549 Dorsalgia, unspecified: Principal | ICD-10-CM

## 2018-01-11 DIAGNOSIS — G8929 Other chronic pain: Secondary | ICD-10-CM

## 2018-01-11 DIAGNOSIS — R05 Cough: Secondary | ICD-10-CM

## 2018-01-11 DIAGNOSIS — I10 Essential (primary) hypertension: Principal | ICD-10-CM

## 2018-01-11 MED ORDER — GLIPIZIDE 5 MG TABLET
ORAL_TABLET | Freq: Two times a day (BID) | ORAL | 2 refills | 0 days | Status: CP
Start: 2018-01-11 — End: 2018-03-05

## 2018-01-11 MED ORDER — HYDROCODONE 7.5 MG-ACETAMINOPHEN 325 MG TABLET
ORAL_TABLET | Freq: Four times a day (QID) | ORAL | 0 refills | 0.00000 days | Status: CP | PRN
Start: 2018-01-11 — End: 2018-03-11

## 2018-01-11 MED ORDER — HYDROCODONE 7.5 MG-ACETAMINOPHEN 325 MG TABLET: 1 | tablet | Freq: Four times a day (QID) | 0 refills | 0 days | Status: AC

## 2018-01-12 DIAGNOSIS — R109 Unspecified abdominal pain: Principal | ICD-10-CM

## 2018-01-12 DIAGNOSIS — M549 Dorsalgia, unspecified: Secondary | ICD-10-CM

## 2018-01-12 DIAGNOSIS — E118 Type 2 diabetes mellitus with unspecified complications: Secondary | ICD-10-CM

## 2018-01-12 MED ORDER — TAMSULOSIN 0.4 MG CAPSULE
ORAL_CAPSULE | Freq: Every evening | ORAL | 0 refills | 0 days | Status: CP
Start: 2018-01-12 — End: 2018-02-05

## 2018-01-17 ENCOUNTER — Ambulatory Visit: Admit: 2018-01-17 | Discharge: 2018-01-17 | Payer: MEDICARE

## 2018-01-17 ENCOUNTER — Ambulatory Visit: Admit: 2018-01-17 | Discharge: 2018-01-17 | Payer: MEDICARE | Attending: Family Medicine | Primary: Family Medicine

## 2018-01-17 DIAGNOSIS — I10 Essential (primary) hypertension: Secondary | ICD-10-CM

## 2018-01-17 DIAGNOSIS — R3915 Urgency of urination: Secondary | ICD-10-CM

## 2018-01-17 DIAGNOSIS — N179 Acute kidney failure, unspecified: Principal | ICD-10-CM

## 2018-01-17 DIAGNOSIS — I25119 Atherosclerotic heart disease of native coronary artery with unspecified angina pectoris: Secondary | ICD-10-CM

## 2018-01-17 DIAGNOSIS — R109 Unspecified abdominal pain: Secondary | ICD-10-CM

## 2018-01-17 MED ORDER — NITROGLYCERIN 0.4 MG SUBLINGUAL TABLET
ORAL_TABLET | SUBLINGUAL | 3 refills | 0 days | Status: CP | PRN
Start: 2018-01-17 — End: ?

## 2018-01-29 ENCOUNTER — Ambulatory Visit: Admit: 2018-01-29 | Discharge: 2018-01-30 | Payer: MEDICARE

## 2018-01-29 DIAGNOSIS — R21 Rash and other nonspecific skin eruption: Principal | ICD-10-CM

## 2018-01-29 DIAGNOSIS — L570 Actinic keratosis: Secondary | ICD-10-CM

## 2018-01-29 MED ORDER — TRIAMCINOLONE ACETONIDE 0.1 % TOPICAL OINTMENT
3 refills | 0 days | Status: CP
Start: 2018-01-29 — End: 2018-03-18

## 2018-02-05 MED ORDER — TAMSULOSIN 0.4 MG CAPSULE
ORAL_CAPSULE | Freq: Every evening | ORAL | 2 refills | 0.00000 days | Status: CP
Start: 2018-02-05 — End: 2018-03-18

## 2018-02-08 ENCOUNTER — Ambulatory Visit: Admit: 2018-02-08 | Discharge: 2018-02-09 | Payer: MEDICARE

## 2018-02-08 DIAGNOSIS — N179 Acute kidney failure, unspecified: Secondary | ICD-10-CM

## 2018-02-08 DIAGNOSIS — R109 Unspecified abdominal pain: Secondary | ICD-10-CM

## 2018-02-08 DIAGNOSIS — I10 Essential (primary) hypertension: Principal | ICD-10-CM

## 2018-02-08 DIAGNOSIS — E119 Type 2 diabetes mellitus without complications: Secondary | ICD-10-CM

## 2018-02-08 DIAGNOSIS — R3915 Urgency of urination: Secondary | ICD-10-CM

## 2018-02-08 MED ORDER — BLOOD SUGAR DIAGNOSTIC STRIPS
ORAL_STRIP | 5 refills | 0 days | Status: CP
Start: 2018-02-08 — End: 2018-03-18

## 2018-02-19 MED ORDER — TRAZODONE 150 MG TABLET
ORAL_TABLET | 0 refills | 0 days | Status: CP
Start: 2018-02-19 — End: 2018-03-18

## 2018-03-05 MED ORDER — GLIPIZIDE 5 MG TABLET
ORAL_TABLET | Freq: Two times a day (BID) | ORAL | 2 refills | 0 days | Status: CP
Start: 2018-03-05 — End: 2018-03-18

## 2018-03-12 ENCOUNTER — Ambulatory Visit: Admit: 2018-03-12 | Discharge: 2018-03-13 | Payer: MEDICARE

## 2018-03-12 DIAGNOSIS — N182 Chronic kidney disease, stage 2 (mild): Secondary | ICD-10-CM

## 2018-03-12 DIAGNOSIS — F1120 Opioid dependence, uncomplicated: Secondary | ICD-10-CM

## 2018-03-12 DIAGNOSIS — G8929 Other chronic pain: Secondary | ICD-10-CM

## 2018-03-12 DIAGNOSIS — E1122 Type 2 diabetes mellitus with diabetic chronic kidney disease: Secondary | ICD-10-CM

## 2018-03-12 DIAGNOSIS — R3915 Urgency of urination: Principal | ICD-10-CM

## 2018-03-12 DIAGNOSIS — R109 Unspecified abdominal pain: Secondary | ICD-10-CM

## 2018-03-12 DIAGNOSIS — I10 Essential (primary) hypertension: Secondary | ICD-10-CM

## 2018-03-12 DIAGNOSIS — M549 Dorsalgia, unspecified: Secondary | ICD-10-CM

## 2018-03-12 DIAGNOSIS — E1159 Type 2 diabetes mellitus with other circulatory complications: Secondary | ICD-10-CM

## 2018-03-12 MED ORDER — HYDROCODONE 7.5 MG-ACETAMINOPHEN 325 MG TABLET
ORAL_TABLET | Freq: Four times a day (QID) | ORAL | 0 refills | 0.00000 days | Status: CP | PRN
Start: 2018-03-12 — End: 2018-05-28

## 2018-03-12 MED ORDER — HYDROCODONE 7.5 MG-ACETAMINOPHEN 325 MG TABLET: 1 | tablet | Freq: Four times a day (QID) | 0 refills | 0 days | Status: AC

## 2018-03-18 MED ORDER — TAMSULOSIN 0.4 MG CAPSULE
ORAL_CAPSULE | Freq: Every evening | ORAL | 5 refills | 0.00000 days | Status: CP
Start: 2018-03-18 — End: 2018-10-05

## 2018-03-18 MED ORDER — TRAZODONE 150 MG TABLET
ORAL_TABLET | 5 refills | 0 days | Status: CP
Start: 2018-03-18 — End: 2018-04-11

## 2018-03-18 MED ORDER — GLIPIZIDE 5 MG TABLET
ORAL_TABLET | Freq: Two times a day (BID) | ORAL | 5 refills | 0.00000 days | Status: CP
Start: 2018-03-18 — End: 2018-11-20

## 2018-03-18 MED ORDER — OMEPRAZOLE 20 MG CAPSULE,DELAYED RELEASE
ORAL_CAPSULE | Freq: Every day | ORAL | 1 refills | 0 days | Status: CP
Start: 2018-03-18 — End: 2018-10-15

## 2018-03-18 MED ORDER — TRIAMCINOLONE ACETONIDE 0.1 % TOPICAL OINTMENT
3 refills | 0 days | Status: CP
Start: 2018-03-18 — End: 2018-04-02

## 2018-03-18 MED ORDER — BLOOD-GLUCOSE METER
Freq: Every day | INTRADERMAL | 0 refills | 0.00000 days | Status: CP
Start: 2018-03-18 — End: ?

## 2018-03-18 MED ORDER — BLOOD SUGAR DIAGNOSTIC STRIPS
ORAL_STRIP | 5 refills | 0 days | Status: CP
Start: 2018-03-18 — End: ?

## 2018-04-02 ENCOUNTER — Ambulatory Visit: Admit: 2018-04-02 | Discharge: 2018-04-03 | Payer: MEDICARE

## 2018-04-02 DIAGNOSIS — R21 Rash and other nonspecific skin eruption: Principal | ICD-10-CM

## 2018-04-02 DIAGNOSIS — L304 Erythema intertrigo: Secondary | ICD-10-CM

## 2018-04-02 DIAGNOSIS — L570 Actinic keratosis: Secondary | ICD-10-CM

## 2018-04-02 MED ORDER — TRIAMCINOLONE ACETONIDE 0.1 % TOPICAL OINTMENT
3 refills | 0 days | Status: CP
Start: 2018-04-02 — End: 2018-12-17

## 2018-04-03 ENCOUNTER — Ambulatory Visit: Admit: 2018-04-03 | Discharge: 2018-04-03 | Payer: MEDICARE | Attending: Family | Primary: Family

## 2018-04-03 ENCOUNTER — Ambulatory Visit: Admit: 2018-04-03 | Discharge: 2018-04-03 | Payer: MEDICARE | Attending: Ophthalmology | Primary: Ophthalmology

## 2018-04-03 DIAGNOSIS — H5712 Ocular pain, left eye: Principal | ICD-10-CM

## 2018-04-03 MED ORDER — PREDNISOLONE ACETATE 1 % EYE DROPS,SUSPENSION
Freq: Four times a day (QID) | OPHTHALMIC | 0 refills | 0 days | Status: SS
Start: 2018-04-03 — End: 2018-11-12

## 2018-04-10 ENCOUNTER — Ambulatory Visit: Admit: 2018-04-10 | Discharge: 2018-04-11 | Payer: MEDICARE

## 2018-04-10 DIAGNOSIS — H5712 Ocular pain, left eye: Principal | ICD-10-CM

## 2018-04-11 MED ORDER — TRAZODONE 150 MG TABLET
ORAL_TABLET | 1 refills | 0 days | Status: CP
Start: 2018-04-11 — End: 2018-10-07

## 2018-05-15 ENCOUNTER — Ambulatory Visit: Admit: 2018-05-15 | Discharge: 2018-05-16 | Payer: MEDICARE | Attending: Ophthalmology | Primary: Ophthalmology

## 2018-05-15 DIAGNOSIS — H5712 Ocular pain, left eye: Principal | ICD-10-CM

## 2018-05-17 MED ORDER — CLOPIDOGREL 75 MG TABLET
ORAL_TABLET | 2 refills | 0 days | Status: CP
Start: 2018-05-17 — End: 2018-12-18

## 2018-05-28 ENCOUNTER — Ambulatory Visit: Admit: 2018-05-28 | Discharge: 2018-05-29 | Payer: MEDICARE

## 2018-05-28 ENCOUNTER — Ambulatory Visit
Admit: 2018-05-28 | Discharge: 2018-05-29 | Payer: MEDICARE | Attending: Foot and Ankle Surgery | Primary: Foot and Ankle Surgery

## 2018-05-28 DIAGNOSIS — M79672 Pain in left foot: Principal | ICD-10-CM

## 2018-05-28 DIAGNOSIS — M7742 Metatarsalgia, left foot: Secondary | ICD-10-CM

## 2018-05-30 ENCOUNTER — Ambulatory Visit: Admit: 2018-05-30 | Discharge: 2018-05-31 | Payer: MEDICARE

## 2018-05-30 DIAGNOSIS — K219 Gastro-esophageal reflux disease without esophagitis: Secondary | ICD-10-CM

## 2018-05-30 DIAGNOSIS — I2510 Atherosclerotic heart disease of native coronary artery without angina pectoris: Secondary | ICD-10-CM

## 2018-05-30 DIAGNOSIS — E1169 Type 2 diabetes mellitus with other specified complication: Secondary | ICD-10-CM

## 2018-05-30 DIAGNOSIS — G8929 Other chronic pain: Secondary | ICD-10-CM

## 2018-05-30 DIAGNOSIS — F419 Anxiety disorder, unspecified: Secondary | ICD-10-CM

## 2018-05-30 DIAGNOSIS — I10 Essential (primary) hypertension: Secondary | ICD-10-CM

## 2018-05-30 DIAGNOSIS — Z01818 Encounter for other preprocedural examination: Principal | ICD-10-CM

## 2018-05-30 DIAGNOSIS — E785 Hyperlipidemia, unspecified: Secondary | ICD-10-CM

## 2018-06-05 ENCOUNTER — Ambulatory Visit: Admit: 2018-06-05 | Discharge: 2018-06-06 | Payer: MEDICARE

## 2018-06-05 DIAGNOSIS — E1159 Type 2 diabetes mellitus with other circulatory complications: Secondary | ICD-10-CM

## 2018-06-05 DIAGNOSIS — N182 Chronic kidney disease, stage 2 (mild): Secondary | ICD-10-CM

## 2018-06-05 DIAGNOSIS — G8929 Other chronic pain: Secondary | ICD-10-CM

## 2018-06-05 DIAGNOSIS — M79672 Pain in left foot: Principal | ICD-10-CM

## 2018-06-05 DIAGNOSIS — E1122 Type 2 diabetes mellitus with diabetic chronic kidney disease: Secondary | ICD-10-CM

## 2018-06-05 DIAGNOSIS — I10 Essential (primary) hypertension: Secondary | ICD-10-CM

## 2018-06-05 DIAGNOSIS — M549 Dorsalgia, unspecified: Principal | ICD-10-CM

## 2018-06-05 MED ORDER — HYDROCODONE 7.5 MG-ACETAMINOPHEN 325 MG TABLET: 1 | tablet | Freq: Four times a day (QID) | 0 refills | 0 days | Status: AC

## 2018-06-05 MED ORDER — HYDROCODONE 7.5 MG-ACETAMINOPHEN 325 MG TABLET
ORAL_TABLET | Freq: Four times a day (QID) | ORAL | 0 refills | 0.00000 days | Status: CP | PRN
Start: 2018-06-05 — End: 2018-09-05

## 2018-06-06 ENCOUNTER — Encounter
Admit: 2018-06-06 | Discharge: 2018-06-06 | Payer: MEDICARE | Attending: Student in an Organized Health Care Education/Training Program | Primary: Student in an Organized Health Care Education/Training Program

## 2018-06-06 ENCOUNTER — Ambulatory Visit: Admit: 2018-06-06 | Discharge: 2018-06-06 | Payer: MEDICARE

## 2018-06-06 DIAGNOSIS — M79672 Pain in left foot: Principal | ICD-10-CM

## 2018-06-06 MED ORDER — OXYCODONE 5 MG TABLET
ORAL_TABLET | ORAL | 0 refills | 0.00000 days | Status: CP | PRN
Start: 2018-06-06 — End: 2018-06-11
  Filled 2018-06-06: qty 15, 3d supply, fill #0

## 2018-06-06 MED ORDER — PROMETHAZINE 12.5 MG TABLET
ORAL_TABLET | Freq: Four times a day (QID) | ORAL | 0 refills | 0.00000 days | Status: CP | PRN
Start: 2018-06-06 — End: 2018-11-13
  Filled 2018-06-06: qty 20, 2d supply, fill #0

## 2018-06-06 MED ORDER — ACETAMINOPHEN 500 MG TABLET
ORAL_TABLET | Freq: Three times a day (TID) | ORAL | 0 refills | 0.00000 days | Status: CP
Start: 2018-06-06 — End: 2018-11-13
  Filled 2018-06-06: qty 84, 14d supply, fill #0

## 2018-06-06 MED ORDER — DOCUSATE SODIUM 100 MG CAPSULE
ORAL_CAPSULE | Freq: Two times a day (BID) | ORAL | 0 refills | 0 days | Status: CP
Start: 2018-06-06 — End: 2018-07-06
  Filled 2018-06-06: qty 60, 30d supply, fill #0

## 2018-06-06 MED FILL — OXYCODONE 5 MG TABLET: 3 days supply | Qty: 15 | Fill #0 | Status: AC

## 2018-06-06 MED FILL — PROMETHAZINE 12.5 MG TABLET: 2 days supply | Qty: 20 | Fill #0 | Status: AC

## 2018-06-06 MED FILL — ACETAMINOPHEN 500 MG TABLET: 14 days supply | Qty: 84 | Fill #0 | Status: AC

## 2018-06-06 MED FILL — DOK 100 MG CAPSULE: 30 days supply | Qty: 60 | Fill #0 | Status: AC

## 2018-06-10 MED ORDER — FLUTICASONE PROPIONATE 50 MCG/ACTUATION NASAL SPRAY,SUSPENSION
2 refills | 0 days | Status: CP
Start: 2018-06-10 — End: ?

## 2018-06-11 ENCOUNTER — Ambulatory Visit
Admit: 2018-06-11 | Discharge: 2018-06-12 | Payer: MEDICARE | Attending: Foot and Ankle Surgery | Primary: Foot and Ankle Surgery

## 2018-06-11 DIAGNOSIS — M79672 Pain in left foot: Principal | ICD-10-CM

## 2018-06-12 ENCOUNTER — Ambulatory Visit: Admit: 2018-06-12 | Discharge: 2018-06-13 | Payer: MEDICARE

## 2018-06-12 DIAGNOSIS — E1159 Type 2 diabetes mellitus with other circulatory complications: Principal | ICD-10-CM

## 2018-06-12 DIAGNOSIS — I10 Essential (primary) hypertension: Secondary | ICD-10-CM

## 2018-06-12 DIAGNOSIS — M79672 Pain in left foot: Secondary | ICD-10-CM

## 2018-06-18 ENCOUNTER — Ambulatory Visit
Admit: 2018-06-18 | Discharge: 2018-06-19 | Payer: MEDICARE | Attending: Foot and Ankle Surgery | Primary: Foot and Ankle Surgery

## 2018-06-18 DIAGNOSIS — M79672 Pain in left foot: Principal | ICD-10-CM

## 2018-06-26 ENCOUNTER — Ambulatory Visit
Admit: 2018-06-26 | Discharge: 2018-06-27 | Payer: MEDICARE | Attending: Cardiovascular Disease | Primary: Cardiovascular Disease

## 2018-06-26 DIAGNOSIS — E669 Obesity, unspecified: Principal | ICD-10-CM

## 2018-06-26 DIAGNOSIS — I219 Acute myocardial infarction, unspecified: Principal | ICD-10-CM

## 2018-06-26 DIAGNOSIS — H269 Unspecified cataract: Principal | ICD-10-CM

## 2018-06-26 DIAGNOSIS — I10 Essential (primary) hypertension: Principal | ICD-10-CM

## 2018-06-26 DIAGNOSIS — M1990 Unspecified osteoarthritis, unspecified site: Principal | ICD-10-CM

## 2018-06-26 DIAGNOSIS — F419 Anxiety disorder, unspecified: Principal | ICD-10-CM

## 2018-06-26 DIAGNOSIS — K219 Gastro-esophageal reflux disease without esophagitis: Principal | ICD-10-CM

## 2018-06-26 DIAGNOSIS — F329 Major depressive disorder, single episode, unspecified: Principal | ICD-10-CM

## 2018-06-26 DIAGNOSIS — I509 Heart failure, unspecified: Principal | ICD-10-CM

## 2018-06-26 DIAGNOSIS — C4491 Basal cell carcinoma of skin, unspecified: Principal | ICD-10-CM

## 2018-06-26 DIAGNOSIS — E782 Mixed hyperlipidemia: Principal | ICD-10-CM

## 2018-06-26 DIAGNOSIS — S0500XA Injury of conjunctiva and corneal abrasion without foreign body, unspecified eye, initial encounter: Principal | ICD-10-CM

## 2018-06-26 DIAGNOSIS — R079 Chest pain, unspecified: Principal | ICD-10-CM

## 2018-06-26 DIAGNOSIS — E119 Type 2 diabetes mellitus without complications: Principal | ICD-10-CM

## 2018-06-26 DIAGNOSIS — N179 Acute kidney failure, unspecified: Principal | ICD-10-CM

## 2018-07-03 DIAGNOSIS — N39498 Other specified urinary incontinence: Principal | ICD-10-CM

## 2018-07-19 DIAGNOSIS — N39498 Other specified urinary incontinence: Principal | ICD-10-CM

## 2018-07-24 MED ORDER — DICLOFENAC 1 % TOPICAL GEL
Freq: Four times a day (QID) | TOPICAL | 2 refills | 0.00000 days | Status: CP
Start: 2018-07-24 — End: 2018-12-18

## 2018-08-02 ENCOUNTER — Ambulatory Visit: Admit: 2018-08-02 | Discharge: 2018-08-03 | Payer: MEDICARE

## 2018-08-02 ENCOUNTER — Institutional Professional Consult (permissible substitution)
Admit: 2018-08-02 | Discharge: 2018-08-03 | Payer: MEDICARE | Attending: Student in an Organized Health Care Education/Training Program | Primary: Student in an Organized Health Care Education/Training Program

## 2018-08-02 DIAGNOSIS — N390 Urinary tract infection, site not specified: Principal | ICD-10-CM

## 2018-08-02 DIAGNOSIS — B001 Herpesviral vesicular dermatitis: Secondary | ICD-10-CM

## 2018-08-02 MED ORDER — VALACYCLOVIR 1 GRAM TABLET
ORAL_TABLET | Freq: Two times a day (BID) | ORAL | 0 refills | 0.00000 days | Status: CP
Start: 2018-08-02 — End: 2018-08-03

## 2018-08-02 MED ORDER — LEVOFLOXACIN 750 MG TABLET
ORAL_TABLET | Freq: Every day | ORAL | 0 refills | 0.00000 days | Status: CP
Start: 2018-08-02 — End: 2018-08-09

## 2018-08-05 ENCOUNTER — Ambulatory Visit: Admit: 2018-08-05 | Discharge: 2018-08-06 | Payer: MEDICARE

## 2018-08-05 DIAGNOSIS — R3915 Urgency of urination: Principal | ICD-10-CM

## 2018-08-08 ENCOUNTER — Institutional Professional Consult (permissible substitution): Admit: 2018-08-08 | Discharge: 2018-08-09 | Payer: MEDICARE | Attending: Family Medicine | Primary: Family Medicine

## 2018-08-08 DIAGNOSIS — G5602 Carpal tunnel syndrome, left upper limb: Principal | ICD-10-CM

## 2018-08-21 MED ORDER — FLUCONAZOLE 150 MG TABLET
ORAL_TABLET | Freq: Once | ORAL | 1 refills | 0.00000 days | Status: CP
Start: 2018-08-21 — End: 2018-08-21

## 2018-09-02 ENCOUNTER — Institutional Professional Consult (permissible substitution): Admit: 2018-09-02 | Discharge: 2018-09-03 | Payer: MEDICARE | Attending: Family Medicine | Primary: Family Medicine

## 2018-09-02 DIAGNOSIS — L255 Unspecified contact dermatitis due to plants, except food: Principal | ICD-10-CM

## 2018-09-02 MED ORDER — PREDNISONE 10 MG TABLET
ORAL_TABLET | 0 refills | 0 days | Status: SS
Start: 2018-09-02 — End: 2018-11-12

## 2018-09-06 ENCOUNTER — Telehealth: Admit: 2018-09-06 | Discharge: 2018-09-07 | Payer: MEDICARE

## 2018-09-06 DIAGNOSIS — G8929 Other chronic pain: Secondary | ICD-10-CM

## 2018-09-06 DIAGNOSIS — N182 Chronic kidney disease, stage 2 (mild): Secondary | ICD-10-CM

## 2018-09-06 DIAGNOSIS — I10 Essential (primary) hypertension: Secondary | ICD-10-CM

## 2018-09-06 DIAGNOSIS — M549 Dorsalgia, unspecified: Secondary | ICD-10-CM

## 2018-09-06 DIAGNOSIS — E1122 Type 2 diabetes mellitus with diabetic chronic kidney disease: Secondary | ICD-10-CM

## 2018-09-06 DIAGNOSIS — E1159 Type 2 diabetes mellitus with other circulatory complications: Secondary | ICD-10-CM

## 2018-09-06 MED ORDER — HYDROCODONE 7.5 MG-ACETAMINOPHEN 325 MG TABLET: 1 | tablet | Freq: Four times a day (QID) | 0 refills | 0 days | Status: AC

## 2018-09-06 MED ORDER — HYDROCODONE 7.5 MG-ACETAMINOPHEN 325 MG TABLET
ORAL_TABLET | Freq: Four times a day (QID) | ORAL | 0 refills | 0.00000 days | Status: CP | PRN
Start: 2018-09-06 — End: 2018-11-13

## 2018-09-09 ENCOUNTER — Ambulatory Visit: Admit: 2018-09-09 | Discharge: 2018-09-10 | Payer: MEDICARE

## 2018-09-09 DIAGNOSIS — E1122 Type 2 diabetes mellitus with diabetic chronic kidney disease: Secondary | ICD-10-CM

## 2018-09-09 DIAGNOSIS — N182 Chronic kidney disease, stage 2 (mild): Secondary | ICD-10-CM

## 2018-09-11 ENCOUNTER — Ambulatory Visit: Admit: 2018-09-11 | Discharge: 2018-09-12 | Payer: MEDICARE

## 2018-09-11 DIAGNOSIS — R21 Rash and other nonspecific skin eruption: Principal | ICD-10-CM

## 2018-09-11 MED ORDER — HYDROCORTISONE 1 %-IODOQUINOL 1 % TOPICAL CREAM
TOPICAL | 5 refills | 0.00000 days | Status: CP
Start: 2018-09-11 — End: ?

## 2018-09-11 MED ORDER — GABAPENTIN 300 MG CAPSULE
ORAL_CAPSULE | Freq: Three times a day (TID) | ORAL | 3 refills | 0.00000 days | Status: CP
Start: 2018-09-11 — End: 2019-09-11

## 2018-09-11 MED ORDER — HYDROCORTISONE 2.5 % TOPICAL CREAM
Freq: Two times a day (BID) | TOPICAL | 0 refills | 0.00000 days | Status: CP
Start: 2018-09-11 — End: 2019-09-11

## 2018-09-27 ENCOUNTER — Ambulatory Visit: Admit: 2018-09-27 | Discharge: 2018-09-28 | Payer: MEDICARE

## 2018-09-27 DIAGNOSIS — M25552 Pain in left hip: Principal | ICD-10-CM

## 2018-09-27 DIAGNOSIS — M12812 Other specific arthropathies, not elsewhere classified, left shoulder: Secondary | ICD-10-CM

## 2018-09-27 DIAGNOSIS — M25512 Pain in left shoulder: Secondary | ICD-10-CM

## 2018-09-27 DIAGNOSIS — M75102 Unspecified rotator cuff tear or rupture of left shoulder, not specified as traumatic: Secondary | ICD-10-CM

## 2018-10-04 ENCOUNTER — Ambulatory Visit: Admit: 2018-10-04 | Discharge: 2018-10-05 | Payer: MEDICARE

## 2018-10-04 DIAGNOSIS — M75122 Complete rotator cuff tear or rupture of left shoulder, not specified as traumatic: Principal | ICD-10-CM

## 2018-10-05 MED ORDER — TAMSULOSIN 0.4 MG CAPSULE
ORAL_CAPSULE | 1 refills | 0 days | Status: CP
Start: 2018-10-05 — End: ?

## 2018-10-07 MED ORDER — HYDROCHLOROTHIAZIDE 25 MG TABLET
ORAL_TABLET | 3 refills | 0 days | Status: CP
Start: 2018-10-07 — End: 2018-12-06

## 2018-10-07 MED ORDER — TRAZODONE 150 MG TABLET
ORAL_TABLET | 1 refills | 0 days | Status: CP
Start: 2018-10-07 — End: ?

## 2018-10-07 MED ORDER — IRBESARTAN 150 MG TABLET
ORAL_TABLET | 1 refills | 0 days | Status: CP
Start: 2018-10-07 — End: ?

## 2018-10-10 ENCOUNTER — Ambulatory Visit: Admit: 2018-10-10 | Discharge: 2018-10-11 | Payer: MEDICARE

## 2018-10-10 DIAGNOSIS — M5416 Radiculopathy, lumbar region: Secondary | ICD-10-CM

## 2018-10-10 DIAGNOSIS — Z981 Arthrodesis status: Principal | ICD-10-CM

## 2018-10-10 DIAGNOSIS — M5136 Other intervertebral disc degeneration, lumbar region: Secondary | ICD-10-CM

## 2018-10-12 ENCOUNTER — Ambulatory Visit: Admit: 2018-10-12 | Discharge: 2018-10-12 | Payer: MEDICARE

## 2018-10-12 DIAGNOSIS — M75122 Complete rotator cuff tear or rupture of left shoulder, not specified as traumatic: Principal | ICD-10-CM

## 2018-10-15 ENCOUNTER — Ambulatory Visit
Admit: 2018-10-15 | Discharge: 2018-10-16 | Payer: MEDICARE | Attending: Foot and Ankle Surgery | Primary: Foot and Ankle Surgery

## 2018-10-15 DIAGNOSIS — M7742 Metatarsalgia, left foot: Principal | ICD-10-CM

## 2018-10-15 MED ORDER — METOPROLOL SUCCINATE ER 100 MG TABLET,EXTENDED RELEASE 24 HR
ORAL_TABLET | 3 refills | 0 days | Status: CP
Start: 2018-10-15 — End: ?

## 2018-10-15 MED ORDER — OMEPRAZOLE 20 MG CAPSULE,DELAYED RELEASE
ORAL_CAPSULE | 1 refills | 0 days | Status: CP
Start: 2018-10-15 — End: ?

## 2018-10-16 MED ORDER — METFORMIN 500 MG TABLET
ORAL_TABLET | 1 refills | 0 days | Status: CP
Start: 2018-10-16 — End: 2018-11-01

## 2018-10-19 ENCOUNTER — Ambulatory Visit: Admit: 2018-10-19 | Discharge: 2018-10-20 | Payer: MEDICARE

## 2018-10-19 DIAGNOSIS — M5136 Other intervertebral disc degeneration, lumbar region: Secondary | ICD-10-CM

## 2018-10-19 DIAGNOSIS — M5416 Radiculopathy, lumbar region: Secondary | ICD-10-CM

## 2018-10-19 DIAGNOSIS — Z981 Arthrodesis status: Principal | ICD-10-CM

## 2018-10-23 ENCOUNTER — Ambulatory Visit: Admit: 2018-10-23 | Discharge: 2018-10-24 | Payer: MEDICARE

## 2018-10-23 DIAGNOSIS — L291 Pruritus scroti: Principal | ICD-10-CM

## 2018-10-23 MED ORDER — TACROLIMUS 0.1 % TOPICAL OINTMENT
6 refills | 0 days | Status: CP
Start: 2018-10-23 — End: ?

## 2018-10-23 MED ORDER — PIMECROLIMUS 1 % TOPICAL CREAM
7 refills | 0 days | Status: CP
Start: 2018-10-23 — End: ?

## 2018-10-29 ENCOUNTER — Ambulatory Visit: Admit: 2018-10-29 | Discharge: 2018-10-30 | Payer: MEDICARE

## 2018-10-29 DIAGNOSIS — M5136 Other intervertebral disc degeneration, lumbar region: Principal | ICD-10-CM

## 2018-10-31 ENCOUNTER — Ambulatory Visit: Admit: 2018-10-31 | Discharge: 2018-11-01 | Payer: MEDICARE | Attending: Internal Medicine | Primary: Internal Medicine

## 2018-10-31 DIAGNOSIS — M25552 Pain in left hip: Principal | ICD-10-CM

## 2018-10-31 DIAGNOSIS — M1612 Unilateral primary osteoarthritis, left hip: Secondary | ICD-10-CM

## 2018-11-01 ENCOUNTER — Ambulatory Visit: Admit: 2018-11-01 | Discharge: 2018-11-01 | Payer: MEDICARE

## 2018-11-01 DIAGNOSIS — F1120 Opioid dependence, uncomplicated: Secondary | ICD-10-CM

## 2018-11-01 DIAGNOSIS — I25119 Atherosclerotic heart disease of native coronary artery with unspecified angina pectoris: Principal | ICD-10-CM

## 2018-11-01 DIAGNOSIS — E1159 Type 2 diabetes mellitus with other circulatory complications: Principal | ICD-10-CM

## 2018-11-01 DIAGNOSIS — N182 Chronic kidney disease, stage 2 (mild): Secondary | ICD-10-CM

## 2018-11-01 DIAGNOSIS — I10 Essential (primary) hypertension: Secondary | ICD-10-CM

## 2018-11-01 DIAGNOSIS — K219 Gastro-esophageal reflux disease without esophagitis: Secondary | ICD-10-CM

## 2018-11-01 DIAGNOSIS — E1122 Type 2 diabetes mellitus with diabetic chronic kidney disease: Secondary | ICD-10-CM

## 2018-11-01 DIAGNOSIS — M75102 Unspecified rotator cuff tear or rupture of left shoulder, not specified as traumatic: Secondary | ICD-10-CM

## 2018-11-01 DIAGNOSIS — M12812 Other specific arthropathies, not elsewhere classified, left shoulder: Secondary | ICD-10-CM

## 2018-11-01 DIAGNOSIS — F331 Major depressive disorder, recurrent, moderate: Secondary | ICD-10-CM

## 2018-11-09 DIAGNOSIS — Z1159 Encounter for screening for other viral diseases: Principal | ICD-10-CM

## 2018-11-11 DIAGNOSIS — M75102 Unspecified rotator cuff tear or rupture of left shoulder, not specified as traumatic: Principal | ICD-10-CM

## 2018-11-12 ENCOUNTER — Ambulatory Visit: Admit: 2018-11-12 | Discharge: 2018-11-13 | Disposition: A | Discharge status: Home / Self Care | Payer: MEDICARE

## 2018-11-12 ENCOUNTER — Encounter
Admit: 2018-11-12 | Discharge: 2018-11-13 | Disposition: A | Discharge status: Home / Self Care | Payer: MEDICARE | Attending: Anesthesiology

## 2018-11-12 DIAGNOSIS — M75102 Unspecified rotator cuff tear or rupture of left shoulder, not specified as traumatic: Principal | ICD-10-CM

## 2018-11-12 MED ORDER — ASPIRIN 81 MG TABLET,DELAYED RELEASE
ORAL_TABLET | Freq: Every day | ORAL | 0 refills | 30.00000 days | Status: CP
Start: 2018-11-12 — End: 2018-11-13

## 2018-11-12 MED ORDER — OXYCODONE 5 MG TABLET
ORAL_TABLET | Freq: Four times a day (QID) | ORAL | 0 refills | 4.00000 days | Status: CP | PRN
Start: 2018-11-12 — End: 2018-11-13

## 2018-11-12 MED ORDER — DOCUSATE SODIUM 100 MG CAPSULE
ORAL_CAPSULE | Freq: Two times a day (BID) | ORAL | 0 refills | 14.00000 days | Status: CP
Start: 2018-11-12 — End: 2018-11-26

## 2018-11-12 MED ORDER — ACETAMINOPHEN 500 MG TABLET
ORAL_TABLET | Freq: Three times a day (TID) | ORAL | 1 refills | 7 days | Status: CP
Start: 2018-11-12 — End: 2018-11-13

## 2018-11-13 MED ORDER — HYDROCODONE 5 MG-ACETAMINOPHEN 325 MG TABLET
ORAL_TABLET | ORAL | 0 refills | 5.00000 days | Status: CP | PRN
Start: 2018-11-13 — End: 2018-11-18
  Filled 2018-11-13: qty 30, 5d supply, fill #0

## 2018-11-13 MED FILL — HYDROCODONE 5 MG-ACETAMINOPHEN 325 MG TABLET: 5 days supply | Qty: 30 | Fill #0 | Status: AC

## 2018-11-18 ENCOUNTER — Telehealth
Admit: 2018-11-18 | Discharge: 2018-11-19 | Payer: MEDICARE | Attending: Student in an Organized Health Care Education/Training Program | Primary: Student in an Organized Health Care Education/Training Program

## 2018-11-18 ENCOUNTER — Ambulatory Visit: Admit: 2018-11-18 | Discharge: 2018-11-19 | Payer: MEDICARE

## 2018-11-18 DIAGNOSIS — E1122 Type 2 diabetes mellitus with diabetic chronic kidney disease: Secondary | ICD-10-CM

## 2018-11-18 DIAGNOSIS — I10 Essential (primary) hypertension: Secondary | ICD-10-CM

## 2018-11-18 DIAGNOSIS — E1159 Type 2 diabetes mellitus with other circulatory complications: Principal | ICD-10-CM

## 2018-11-18 DIAGNOSIS — N182 Chronic kidney disease, stage 2 (mild): Secondary | ICD-10-CM

## 2018-11-18 DIAGNOSIS — Z96612 Presence of left artificial shoulder joint: Secondary | ICD-10-CM

## 2018-11-18 MED ORDER — SERTRALINE 100 MG TABLET
ORAL_TABLET | Freq: Every day | ORAL | 3 refills | 90 days | Status: CP
Start: 2018-11-18 — End: 2018-12-16

## 2018-11-20 MED ORDER — GLIPIZIDE 5 MG TABLET
ORAL_TABLET | Freq: Two times a day (BID) | ORAL | 5 refills | 30 days | Status: CP
Start: 2018-11-20 — End: ?

## 2018-11-21 MED ORDER — METFORMIN 1,000 MG TABLET
ORAL_TABLET | Freq: Two times a day (BID) | ORAL | 3 refills | 90.00000 days | Status: CP
Start: 2018-11-21 — End: ?

## 2018-11-25 ENCOUNTER — Institutional Professional Consult (permissible substitution): Admit: 2018-11-25 | Discharge: 2018-11-26 | Payer: MEDICARE | Attending: Clinical | Primary: Clinical

## 2018-11-25 DIAGNOSIS — F331 Major depressive disorder, recurrent, moderate: Principal | ICD-10-CM

## 2018-11-29 ENCOUNTER — Ambulatory Visit: Admit: 2018-11-29 | Discharge: 2018-11-30 | Payer: MEDICARE

## 2018-11-29 DIAGNOSIS — M12812 Other specific arthropathies, not elsewhere classified, left shoulder: Secondary | ICD-10-CM

## 2018-11-29 DIAGNOSIS — M75102 Unspecified rotator cuff tear or rupture of left shoulder, not specified as traumatic: Principal | ICD-10-CM

## 2018-12-02 DIAGNOSIS — F329 Major depressive disorder, single episode, unspecified: Principal | ICD-10-CM

## 2018-12-03 ENCOUNTER — Ambulatory Visit
Admit: 2018-12-03 | Discharge: 2019-01-01 | Payer: MEDICARE | Attending: Rehabilitative and Restorative Service Providers" | Primary: Rehabilitative and Restorative Service Providers"

## 2018-12-03 ENCOUNTER — Institutional Professional Consult (permissible substitution): Admit: 2018-12-03 | Discharge: 2018-12-03 | Payer: MEDICARE | Attending: Clinical | Primary: Clinical

## 2018-12-03 DIAGNOSIS — M6281 Muscle weakness (generalized): Secondary | ICD-10-CM

## 2018-12-03 DIAGNOSIS — Z96612 Presence of left artificial shoulder joint: Secondary | ICD-10-CM

## 2018-12-03 DIAGNOSIS — M12812 Other specific arthropathies, not elsewhere classified, left shoulder: Secondary | ICD-10-CM

## 2018-12-03 DIAGNOSIS — G8918 Other acute postprocedural pain: Secondary | ICD-10-CM

## 2018-12-03 DIAGNOSIS — M25512 Pain in left shoulder: Secondary | ICD-10-CM

## 2018-12-03 DIAGNOSIS — M75102 Unspecified rotator cuff tear or rupture of left shoulder, not specified as traumatic: Principal | ICD-10-CM

## 2018-12-05 DIAGNOSIS — M25512 Pain in left shoulder: Secondary | ICD-10-CM

## 2018-12-05 DIAGNOSIS — Z96612 Presence of left artificial shoulder joint: Secondary | ICD-10-CM

## 2018-12-05 DIAGNOSIS — G8918 Other acute postprocedural pain: Secondary | ICD-10-CM

## 2018-12-05 DIAGNOSIS — M75102 Unspecified rotator cuff tear or rupture of left shoulder, not specified as traumatic: Principal | ICD-10-CM

## 2018-12-05 DIAGNOSIS — M12812 Other specific arthropathies, not elsewhere classified, left shoulder: Secondary | ICD-10-CM

## 2018-12-05 DIAGNOSIS — M6281 Muscle weakness (generalized): Secondary | ICD-10-CM

## 2018-12-06 ENCOUNTER — Ambulatory Visit: Admit: 2018-12-06 | Discharge: 2018-12-07 | Payer: MEDICARE

## 2018-12-06 DIAGNOSIS — E1122 Type 2 diabetes mellitus with diabetic chronic kidney disease: Secondary | ICD-10-CM

## 2018-12-06 DIAGNOSIS — G8929 Other chronic pain: Secondary | ICD-10-CM

## 2018-12-06 DIAGNOSIS — N182 Chronic kidney disease, stage 2 (mild): Secondary | ICD-10-CM

## 2018-12-06 DIAGNOSIS — I10 Essential (primary) hypertension: Secondary | ICD-10-CM

## 2018-12-06 DIAGNOSIS — M549 Dorsalgia, unspecified: Principal | ICD-10-CM

## 2018-12-06 DIAGNOSIS — N39498 Other specified urinary incontinence: Secondary | ICD-10-CM

## 2018-12-06 DIAGNOSIS — E1159 Type 2 diabetes mellitus with other circulatory complications: Secondary | ICD-10-CM

## 2018-12-06 MED ORDER — HYDROCHLOROTHIAZIDE 25 MG TABLET
ORAL_TABLET | Freq: Every day | ORAL | 3 refills | 180.00000 days | Status: CP
Start: 2018-12-06 — End: ?

## 2018-12-11 DIAGNOSIS — M75102 Unspecified rotator cuff tear or rupture of left shoulder, not specified as traumatic: Principal | ICD-10-CM

## 2018-12-11 DIAGNOSIS — M6281 Muscle weakness (generalized): Secondary | ICD-10-CM

## 2018-12-11 DIAGNOSIS — M25512 Pain in left shoulder: Secondary | ICD-10-CM

## 2018-12-11 DIAGNOSIS — Z96612 Presence of left artificial shoulder joint: Secondary | ICD-10-CM

## 2018-12-11 DIAGNOSIS — G8918 Other acute postprocedural pain: Secondary | ICD-10-CM

## 2018-12-11 DIAGNOSIS — M12812 Other specific arthropathies, not elsewhere classified, left shoulder: Secondary | ICD-10-CM

## 2018-12-13 ENCOUNTER — Institutional Professional Consult (permissible substitution): Admit: 2018-12-13 | Discharge: 2018-12-14 | Payer: MEDICARE | Attending: Clinical | Primary: Clinical

## 2018-12-13 DIAGNOSIS — M25512 Pain in left shoulder: Secondary | ICD-10-CM

## 2018-12-13 DIAGNOSIS — M75102 Unspecified rotator cuff tear or rupture of left shoulder, not specified as traumatic: Principal | ICD-10-CM

## 2018-12-13 DIAGNOSIS — M6281 Muscle weakness (generalized): Secondary | ICD-10-CM

## 2018-12-13 DIAGNOSIS — G8918 Other acute postprocedural pain: Secondary | ICD-10-CM

## 2018-12-13 DIAGNOSIS — F329 Major depressive disorder, single episode, unspecified: Principal | ICD-10-CM

## 2018-12-13 DIAGNOSIS — M12812 Other specific arthropathies, not elsewhere classified, left shoulder: Secondary | ICD-10-CM

## 2018-12-13 DIAGNOSIS — Z96612 Presence of left artificial shoulder joint: Secondary | ICD-10-CM

## 2018-12-16 DIAGNOSIS — M25512 Pain in left shoulder: Secondary | ICD-10-CM

## 2018-12-16 DIAGNOSIS — Z96612 Presence of left artificial shoulder joint: Principal | ICD-10-CM

## 2018-12-16 DIAGNOSIS — G8918 Other acute postprocedural pain: Secondary | ICD-10-CM

## 2018-12-16 DIAGNOSIS — M6281 Muscle weakness (generalized): Secondary | ICD-10-CM

## 2018-12-16 MED ORDER — SERTRALINE 100 MG TABLET
ORAL_TABLET | Freq: Every day | ORAL | 3 refills | 90 days | Status: CP
Start: 2018-12-16 — End: 2019-12-16

## 2018-12-17 MED ORDER — TRIAMCINOLONE ACETONIDE 0.1 % TOPICAL OINTMENT
0 refills | 0 days | Status: CP
Start: 2018-12-17 — End: ?

## 2018-12-18 DIAGNOSIS — G8918 Other acute postprocedural pain: Secondary | ICD-10-CM

## 2018-12-18 DIAGNOSIS — M25512 Pain in left shoulder: Secondary | ICD-10-CM

## 2018-12-18 DIAGNOSIS — Z96612 Presence of left artificial shoulder joint: Principal | ICD-10-CM

## 2018-12-18 DIAGNOSIS — M6281 Muscle weakness (generalized): Secondary | ICD-10-CM

## 2018-12-18 DIAGNOSIS — M12812 Other specific arthropathies, not elsewhere classified, left shoulder: Secondary | ICD-10-CM

## 2018-12-18 DIAGNOSIS — M75102 Unspecified rotator cuff tear or rupture of left shoulder, not specified as traumatic: Secondary | ICD-10-CM

## 2018-12-18 MED ORDER — CLOPIDOGREL 75 MG TABLET
ORAL_TABLET | 2 refills | 0 days | Status: CP
Start: 2018-12-18 — End: ?

## 2018-12-18 MED ORDER — DICLOFENAC 1 % TOPICAL GEL
Freq: Four times a day (QID) | TOPICAL | 2 refills | 13 days | Status: CP
Start: 2018-12-18 — End: ?

## 2018-12-21 MED ORDER — HYDROCODONE 7.5 MG-ACETAMINOPHEN 325 MG TABLET
ORAL_TABLET | Freq: Four times a day (QID) | ORAL | 0 refills | 28 days | Status: CP | PRN
Start: 2018-12-21 — End: 2019-01-20

## 2018-12-24 DIAGNOSIS — M75102 Unspecified rotator cuff tear or rupture of left shoulder, not specified as traumatic: Secondary | ICD-10-CM

## 2018-12-24 DIAGNOSIS — M12812 Other specific arthropathies, not elsewhere classified, left shoulder: Secondary | ICD-10-CM

## 2018-12-24 DIAGNOSIS — Z96612 Presence of left artificial shoulder joint: Secondary | ICD-10-CM

## 2018-12-24 DIAGNOSIS — G8918 Other acute postprocedural pain: Secondary | ICD-10-CM

## 2018-12-24 DIAGNOSIS — M19012 Primary osteoarthritis, left shoulder: Secondary | ICD-10-CM

## 2018-12-24 DIAGNOSIS — M6281 Muscle weakness (generalized): Secondary | ICD-10-CM

## 2018-12-24 DIAGNOSIS — M25512 Pain in left shoulder: Secondary | ICD-10-CM

## 2018-12-26 DIAGNOSIS — M25512 Pain in left shoulder: Secondary | ICD-10-CM

## 2018-12-26 DIAGNOSIS — M6281 Muscle weakness (generalized): Secondary | ICD-10-CM

## 2018-12-26 DIAGNOSIS — G8918 Other acute postprocedural pain: Secondary | ICD-10-CM

## 2018-12-26 DIAGNOSIS — M19012 Primary osteoarthritis, left shoulder: Secondary | ICD-10-CM

## 2018-12-26 DIAGNOSIS — M12812 Other specific arthropathies, not elsewhere classified, left shoulder: Secondary | ICD-10-CM

## 2018-12-26 DIAGNOSIS — Z96612 Presence of left artificial shoulder joint: Secondary | ICD-10-CM

## 2018-12-26 DIAGNOSIS — M75102 Unspecified rotator cuff tear or rupture of left shoulder, not specified as traumatic: Secondary | ICD-10-CM

## 2018-12-27 ENCOUNTER — Ambulatory Visit: Admit: 2018-12-27 | Discharge: 2018-12-27 | Payer: MEDICARE

## 2018-12-27 ENCOUNTER — Telehealth: Admit: 2018-12-27 | Discharge: 2018-12-27 | Payer: MEDICARE | Attending: Internal Medicine | Primary: Internal Medicine

## 2018-12-27 ENCOUNTER — Institutional Professional Consult (permissible substitution): Admit: 2018-12-27 | Discharge: 2018-12-27 | Payer: MEDICARE | Attending: Clinical | Primary: Clinical

## 2018-12-27 DIAGNOSIS — G5622 Lesion of ulnar nerve, left upper limb: Secondary | ICD-10-CM

## 2018-12-27 DIAGNOSIS — F329 Major depressive disorder, single episode, unspecified: Secondary | ICD-10-CM

## 2018-12-27 DIAGNOSIS — Z955 Presence of coronary angioplasty implant and graft: Secondary | ICD-10-CM

## 2018-12-27 DIAGNOSIS — M1612 Unilateral primary osteoarthritis, left hip: Secondary | ICD-10-CM

## 2018-12-27 DIAGNOSIS — Z96612 Presence of left artificial shoulder joint: Secondary | ICD-10-CM

## 2018-12-27 DIAGNOSIS — M25552 Pain in left hip: Secondary | ICD-10-CM

## 2018-12-27 DIAGNOSIS — M12812 Other specific arthropathies, not elsewhere classified, left shoulder: Secondary | ICD-10-CM

## 2018-12-27 DIAGNOSIS — M75102 Unspecified rotator cuff tear or rupture of left shoulder, not specified as traumatic: Principal | ICD-10-CM

## 2018-12-31 DIAGNOSIS — G5622 Lesion of ulnar nerve, left upper limb: Secondary | ICD-10-CM

## 2019-01-01 DIAGNOSIS — M12812 Other specific arthropathies, not elsewhere classified, left shoulder: Secondary | ICD-10-CM

## 2019-01-01 DIAGNOSIS — M25512 Pain in left shoulder: Secondary | ICD-10-CM

## 2019-01-01 DIAGNOSIS — G8918 Other acute postprocedural pain: Secondary | ICD-10-CM

## 2019-01-01 DIAGNOSIS — M75102 Unspecified rotator cuff tear or rupture of left shoulder, not specified as traumatic: Secondary | ICD-10-CM

## 2019-01-01 DIAGNOSIS — M6281 Muscle weakness (generalized): Secondary | ICD-10-CM

## 2019-01-01 DIAGNOSIS — M19012 Primary osteoarthritis, left shoulder: Secondary | ICD-10-CM

## 2019-01-01 DIAGNOSIS — Z96612 Presence of left artificial shoulder joint: Secondary | ICD-10-CM

## 2019-01-07 ENCOUNTER — Ambulatory Visit: Admit: 2019-01-07 | Discharge: 2019-01-08 | Payer: MEDICARE

## 2019-01-07 ENCOUNTER — Ambulatory Visit: Admit: 2019-01-07 | Discharge: 2019-01-08 | Payer: MEDICARE | Attending: Dermatology | Primary: Dermatology

## 2019-01-07 DIAGNOSIS — M79642 Pain in left hand: Secondary | ICD-10-CM

## 2019-01-07 DIAGNOSIS — M19042 Primary osteoarthritis, left hand: Secondary | ICD-10-CM

## 2019-01-07 DIAGNOSIS — M19041 Primary osteoarthritis, right hand: Secondary | ICD-10-CM

## 2019-01-07 MED ORDER — PREDNISONE 20 MG TABLET
ORAL_TABLET | Freq: Every day | ORAL | 0 refills | 5.00000 days | Status: CP
Start: 2019-01-07 — End: 2019-01-12

## 2019-01-09 ENCOUNTER — Ambulatory Visit: Admit: 2019-01-09 | Discharge: 2019-01-10 | Payer: MEDICARE

## 2019-01-09 DIAGNOSIS — N39498 Other specified urinary incontinence: Secondary | ICD-10-CM

## 2019-01-09 DIAGNOSIS — Z125 Encounter for screening for malignant neoplasm of prostate: Secondary | ICD-10-CM

## 2019-01-09 DIAGNOSIS — N400 Enlarged prostate without lower urinary tract symptoms: Secondary | ICD-10-CM

## 2019-01-09 MED ORDER — TROSPIUM 20 MG TABLET
ORAL_TABLET | Freq: Every evening | ORAL | 11 refills | 30 days | Status: CP
Start: 2019-01-09 — End: 2020-01-09

## 2019-01-10 DIAGNOSIS — F331 Major depressive disorder, recurrent, moderate: Secondary | ICD-10-CM

## 2019-01-13 ENCOUNTER — Ambulatory Visit: Admit: 2019-01-13 | Discharge: 2019-01-14 | Payer: MEDICARE

## 2019-01-13 DIAGNOSIS — M549 Dorsalgia, unspecified: Principal | ICD-10-CM

## 2019-01-13 DIAGNOSIS — F329 Major depressive disorder, single episode, unspecified: Secondary | ICD-10-CM

## 2019-01-13 DIAGNOSIS — G5622 Lesion of ulnar nerve, left upper limb: Secondary | ICD-10-CM

## 2019-01-13 DIAGNOSIS — E1122 Type 2 diabetes mellitus with diabetic chronic kidney disease: Secondary | ICD-10-CM

## 2019-01-13 DIAGNOSIS — I10 Essential (primary) hypertension: Secondary | ICD-10-CM

## 2019-01-13 DIAGNOSIS — N182 Chronic kidney disease, stage 2 (mild): Secondary | ICD-10-CM

## 2019-01-13 DIAGNOSIS — G8929 Other chronic pain: Secondary | ICD-10-CM

## 2019-01-13 DIAGNOSIS — E1159 Type 2 diabetes mellitus with other circulatory complications: Secondary | ICD-10-CM

## 2019-01-14 ENCOUNTER — Ambulatory Visit
Admit: 2019-01-14 | Discharge: 2019-01-15 | Payer: MEDICARE | Attending: Orthopaedic Surgery | Primary: Orthopaedic Surgery

## 2019-01-14 ENCOUNTER — Ambulatory Visit: Admit: 2019-01-14 | Discharge: 2019-01-15 | Payer: MEDICARE | Attending: Clinical | Primary: Clinical

## 2019-01-14 ENCOUNTER — Institutional Professional Consult (permissible substitution): Admit: 2019-01-14 | Discharge: 2019-01-15 | Payer: MEDICARE | Attending: Clinical | Primary: Clinical

## 2019-01-14 DIAGNOSIS — M79642 Pain in left hand: Secondary | ICD-10-CM

## 2019-01-14 DIAGNOSIS — M19042 Primary osteoarthritis, left hand: Secondary | ICD-10-CM

## 2019-01-14 DIAGNOSIS — F329 Major depressive disorder, single episode, unspecified: Secondary | ICD-10-CM

## 2019-01-14 DIAGNOSIS — G5623 Lesion of ulnar nerve, bilateral upper limbs: Secondary | ICD-10-CM

## 2019-01-14 DIAGNOSIS — M19041 Primary osteoarthritis, right hand: Secondary | ICD-10-CM

## 2019-01-17 DIAGNOSIS — F331 Major depressive disorder, recurrent, moderate: Secondary | ICD-10-CM

## 2019-01-21 MED ORDER — HYDROCODONE 7.5 MG-ACETAMINOPHEN 325 MG TABLET
ORAL_TABLET | Freq: Four times a day (QID) | ORAL | 0 refills | 28.00000 days | Status: CP | PRN
Start: 2019-01-21 — End: 2019-02-20

## 2019-01-22 DIAGNOSIS — F329 Major depressive disorder, single episode, unspecified: Secondary | ICD-10-CM

## 2019-01-29 ENCOUNTER — Ambulatory Visit
Admit: 2019-01-29 | Discharge: 2019-02-27 | Payer: MEDICARE | Attending: Rehabilitative and Restorative Service Providers" | Primary: Rehabilitative and Restorative Service Providers"

## 2019-01-29 DIAGNOSIS — G5623 Lesion of ulnar nerve, bilateral upper limbs: Secondary | ICD-10-CM

## 2019-02-04 ENCOUNTER — Ambulatory Visit: Admit: 2019-02-04 | Discharge: 2019-02-05 | Payer: MEDICARE

## 2019-02-07 ENCOUNTER — Ambulatory Visit
Admit: 2019-02-07 | Discharge: 2019-02-08 | Payer: MEDICARE | Attending: Orthopaedic Surgery | Primary: Orthopaedic Surgery

## 2019-02-11 DIAGNOSIS — F329 Major depressive disorder, single episode, unspecified: Principal | ICD-10-CM

## 2019-02-15 ENCOUNTER — Ambulatory Visit: Admit: 2019-02-15 | Discharge: 2019-02-16 | Payer: MEDICARE | Attending: Family | Primary: Family

## 2019-02-17 ENCOUNTER — Encounter
Admit: 2019-02-17 | Discharge: 2019-02-17 | Payer: MEDICARE | Attending: Student in an Organized Health Care Education/Training Program | Primary: Student in an Organized Health Care Education/Training Program

## 2019-02-17 ENCOUNTER — Ambulatory Visit: Admit: 2019-02-17 | Discharge: 2019-02-17 | Payer: MEDICARE

## 2019-02-17 MED ORDER — OXYCODONE 5 MG TABLET
ORAL_TABLET | ORAL | 0 refills | 3.00000 days | Status: CP | PRN
Start: 2019-02-17 — End: 2019-02-24
  Filled 2019-02-17: qty 15, 3d supply, fill #0

## 2019-02-17 MED FILL — OXYCODONE 5 MG TABLET: 3 days supply | Qty: 15 | Fill #0 | Status: AC

## 2019-02-20 MED ORDER — HYDROCODONE 7.5 MG-ACETAMINOPHEN 325 MG TABLET
ORAL_TABLET | Freq: Four times a day (QID) | ORAL | 0 refills | 28 days | Status: CP | PRN
Start: 2019-02-20 — End: 2019-03-22

## 2019-02-21 DIAGNOSIS — F331 Major depressive disorder, recurrent, moderate: Principal | ICD-10-CM

## 2019-02-23 DIAGNOSIS — G8929 Other chronic pain: Principal | ICD-10-CM

## 2019-02-23 DIAGNOSIS — M549 Dorsalgia, unspecified: Principal | ICD-10-CM

## 2019-02-24 ENCOUNTER — Institutional Professional Consult (permissible substitution): Admit: 2019-02-24 | Discharge: 2019-02-25 | Payer: MEDICARE | Attending: Clinical | Primary: Clinical

## 2019-02-24 MED ORDER — DICLOFENAC 1 % TOPICAL GEL
Freq: Four times a day (QID) | TOPICAL | 2 refills | 13.00000 days | Status: CP
Start: 2019-02-24 — End: ?

## 2019-02-24 MED ORDER — TROSPIUM 20 MG TABLET
ORAL_TABLET | Freq: Every evening | ORAL | 3 refills | 90 days | Status: CP
Start: 2019-02-24 — End: 2020-02-24

## 2019-02-28 ENCOUNTER — Ambulatory Visit
Admit: 2019-02-28 | Discharge: 2019-03-01 | Payer: MEDICARE | Attending: Orthopaedic Surgery | Primary: Orthopaedic Surgery

## 2019-02-28 ENCOUNTER — Ambulatory Visit: Admit: 2019-02-28 | Discharge: 2019-03-01 | Payer: MEDICARE

## 2019-02-28 DIAGNOSIS — F331 Major depressive disorder, recurrent, moderate: Principal | ICD-10-CM

## 2019-03-04 ENCOUNTER — Telehealth: Admit: 2019-03-04 | Discharge: 2019-03-05 | Payer: MEDICARE

## 2019-03-06 ENCOUNTER — Ambulatory Visit: Admit: 2019-03-06 | Discharge: 2019-03-07 | Payer: MEDICARE

## 2019-03-06 MED ORDER — HYDROCODONE 7.5 MG-ACETAMINOPHEN 325 MG TABLET: 1 | tablet | Freq: Four times a day (QID) | 0 refills | 28 days | Status: AC

## 2019-03-06 MED ORDER — HYDROCHLOROTHIAZIDE 25 MG TABLET
ORAL_TABLET | Freq: Every day | ORAL | 3 refills | 90 days
Start: 2019-03-06 — End: ?

## 2019-03-06 MED ORDER — GABAPENTIN 300 MG CAPSULE
ORAL_CAPSULE | Freq: Every evening | ORAL | 0 refills | 90.00000 days | Status: CP
Start: 2019-03-06 — End: 2020-03-05

## 2019-03-06 MED ORDER — HYDROCODONE 7.5 MG-ACETAMINOPHEN 325 MG TABLET
ORAL_TABLET | Freq: Four times a day (QID) | ORAL | 0 refills | 28.00000 days | Status: CP | PRN
Start: 2019-03-06 — End: ?

## 2019-03-07 ENCOUNTER — Telehealth: Admit: 2019-03-07 | Discharge: 2019-03-08 | Payer: MEDICARE

## 2019-03-10 ENCOUNTER — Ambulatory Visit: Admit: 2019-03-10 | Discharge: 2019-03-11 | Payer: MEDICARE

## 2019-03-11 ENCOUNTER — Ambulatory Visit: Admit: 2019-03-11 | Discharge: 2019-03-12 | Payer: MEDICARE

## 2019-03-14 DIAGNOSIS — G5623 Lesion of ulnar nerve, bilateral upper limbs: Principal | ICD-10-CM

## 2019-03-14 MED ORDER — CEPHALEXIN 500 MG CAPSULE
ORAL_CAPSULE | Freq: Four times a day (QID) | ORAL | 0 refills | 7 days | Status: CP
Start: 2019-03-14 — End: ?

## 2019-03-24 ENCOUNTER — Institutional Professional Consult (permissible substitution): Admit: 2019-03-24 | Discharge: 2019-03-25 | Payer: MEDICARE | Attending: Clinical | Primary: Clinical

## 2019-03-26 ENCOUNTER — Ambulatory Visit
Admit: 2019-03-26 | Discharge: 2019-04-01 | Payer: MEDICARE | Attending: Rehabilitative and Restorative Service Providers" | Primary: Rehabilitative and Restorative Service Providers"

## 2019-03-28 DIAGNOSIS — F329 Major depressive disorder, single episode, unspecified: Principal | ICD-10-CM

## 2019-04-02 ENCOUNTER — Ambulatory Visit: Admit: 2019-04-02 | Discharge: 2019-04-03 | Payer: MEDICARE

## 2019-04-02 DIAGNOSIS — H1131 Conjunctival hemorrhage, right eye: Principal | ICD-10-CM

## 2019-04-02 DIAGNOSIS — H04121 Dry eye syndrome of right lacrimal gland: Principal | ICD-10-CM

## 2019-04-03 DIAGNOSIS — E118 Type 2 diabetes mellitus with unspecified complications: Principal | ICD-10-CM

## 2019-04-04 MED ORDER — GLIPIZIDE 5 MG TABLET
ORAL_TABLET | 5 refills | 0 days | Status: CP
Start: 2019-04-04 — End: ?

## 2019-04-04 MED ORDER — GABAPENTIN 300 MG CAPSULE
ORAL_CAPSULE | 0 refills | 0 days | Status: CP
Start: 2019-04-04 — End: ?

## 2019-04-11 MED ORDER — TRAZODONE 150 MG TABLET
ORAL_TABLET | 1 refills | 0 days | Status: CP
Start: 2019-04-11 — End: ?

## 2019-04-12 DIAGNOSIS — K219 Gastro-esophageal reflux disease without esophagitis: Principal | ICD-10-CM

## 2019-04-14 MED ORDER — IRBESARTAN 150 MG TABLET
ORAL_TABLET | 1 refills | 0 days | Status: CP
Start: 2019-04-14 — End: ?

## 2019-04-14 MED ORDER — OMEPRAZOLE 20 MG CAPSULE,DELAYED RELEASE
ORAL_CAPSULE | 1 refills | 0 days | Status: CP
Start: 2019-04-14 — End: ?

## 2019-04-14 MED ORDER — TAMSULOSIN 0.4 MG CAPSULE
ORAL_CAPSULE | 1 refills | 0 days | Status: CP
Start: 2019-04-14 — End: ?

## 2019-04-17 ENCOUNTER — Ambulatory Visit: Admit: 2019-04-17 | Discharge: 2019-05-01 | Payer: MEDICARE

## 2019-04-21 ENCOUNTER — Institutional Professional Consult (permissible substitution): Admit: 2019-04-21 | Discharge: 2019-04-22 | Payer: MEDICARE | Attending: Clinical | Primary: Clinical

## 2019-04-24 DIAGNOSIS — F329 Major depressive disorder, single episode, unspecified: Principal | ICD-10-CM

## 2019-04-27 DIAGNOSIS — E119 Type 2 diabetes mellitus without complications: Principal | ICD-10-CM

## 2019-04-28 MED ORDER — ONETOUCH ULTRA BLUE TEST STRIP
ORAL_STRIP | 5 refills | 0 days | Status: CP
Start: 2019-04-28 — End: ?

## 2019-04-29 ENCOUNTER — Ambulatory Visit
Admit: 2019-04-29 | Discharge: 2019-04-30 | Payer: MEDICARE | Attending: Orthopaedic Surgery | Primary: Orthopaedic Surgery

## 2019-04-29 DIAGNOSIS — G5603 Carpal tunnel syndrome, bilateral upper limbs: Principal | ICD-10-CM

## 2019-04-29 DIAGNOSIS — M6530 Trigger finger, unspecified finger: Principal | ICD-10-CM

## 2019-04-29 DIAGNOSIS — G5623 Lesion of ulnar nerve, bilateral upper limbs: Principal | ICD-10-CM

## 2019-05-02 ENCOUNTER — Ambulatory Visit: Admit: 2019-05-02 | Discharge: 2019-05-03 | Payer: MEDICARE

## 2019-05-02 MED ORDER — HYDROCODONE 7.5 MG-ACETAMINOPHEN 325 MG TABLET
ORAL_TABLET | Freq: Four times a day (QID) | ORAL | 0 refills | 28.00000 days | Status: CP | PRN
Start: 2019-05-02 — End: ?

## 2019-05-02 MED ORDER — HYDROCODONE 7.5 MG-ACETAMINOPHEN 325 MG TABLET: 1 | tablet | Freq: Four times a day (QID) | 0 refills | 28 days | Status: AC

## 2019-05-02 MED ORDER — ONETOUCH ULTRA BLUE TEST STRIP
ORAL_STRIP | 5 refills | 0 days | Status: CP
Start: 2019-05-02 — End: ?

## 2019-05-12 MED ORDER — GABAPENTIN 300 MG CAPSULE
ORAL_CAPSULE | Freq: Every evening | ORAL | 2 refills | 90.00000 days | Status: CP
Start: 2019-05-12 — End: ?

## 2019-05-15 DIAGNOSIS — N182 Chronic kidney disease, stage 2 (mild): Principal | ICD-10-CM

## 2019-05-15 DIAGNOSIS — E1122 Type 2 diabetes mellitus with diabetic chronic kidney disease: Principal | ICD-10-CM

## 2019-05-15 MED ORDER — ONETOUCH ULTRA BLUE TEST STRIP
ORAL_STRIP | 5 refills | 0 days | Status: CP
Start: 2019-05-15 — End: ?

## 2019-05-18 DIAGNOSIS — I10 Essential (primary) hypertension: Principal | ICD-10-CM

## 2019-05-18 MED ORDER — METOPROLOL SUCCINATE ER 100 MG TABLET,EXTENDED RELEASE 24 HR
ORAL_TABLET | Freq: Every day | ORAL | 1 refills | 90 days | Status: CP
Start: 2019-05-18 — End: ?

## 2019-05-19 ENCOUNTER — Institutional Professional Consult (permissible substitution): Admit: 2019-05-19 | Discharge: 2019-05-20 | Payer: MEDICARE | Attending: Clinical | Primary: Clinical

## 2019-05-23 DIAGNOSIS — F329 Major depressive disorder, single episode, unspecified: Principal | ICD-10-CM

## 2019-05-29 DIAGNOSIS — F329 Major depressive disorder, single episode, unspecified: Principal | ICD-10-CM

## 2019-05-29 DIAGNOSIS — N182 Chronic kidney disease, stage 2 (mild): Principal | ICD-10-CM

## 2019-05-29 DIAGNOSIS — E1122 Type 2 diabetes mellitus with diabetic chronic kidney disease: Principal | ICD-10-CM

## 2019-06-11 ENCOUNTER — Ambulatory Visit: Admit: 2019-06-11 | Discharge: 2019-06-12 | Payer: MEDICARE

## 2019-06-11 DIAGNOSIS — E1122 Type 2 diabetes mellitus with diabetic chronic kidney disease: Principal | ICD-10-CM

## 2019-06-11 DIAGNOSIS — E1159 Type 2 diabetes mellitus with other circulatory complications: Principal | ICD-10-CM

## 2019-06-11 DIAGNOSIS — R21 Rash and other nonspecific skin eruption: Principal | ICD-10-CM

## 2019-06-11 DIAGNOSIS — E119 Type 2 diabetes mellitus without complications: Principal | ICD-10-CM

## 2019-06-11 DIAGNOSIS — I10 Essential (primary) hypertension: Principal | ICD-10-CM

## 2019-06-11 DIAGNOSIS — N182 Chronic kidney disease, stage 2 (mild): Principal | ICD-10-CM

## 2019-06-20 ENCOUNTER — Ambulatory Visit: Admit: 2019-06-20 | Discharge: 2019-06-21 | Payer: MEDICARE | Attending: Family | Primary: Family

## 2019-07-04 DIAGNOSIS — N182 Chronic kidney disease, stage 2 (mild): Principal | ICD-10-CM

## 2019-07-04 DIAGNOSIS — E119 Type 2 diabetes mellitus without complications: Principal | ICD-10-CM

## 2019-07-04 DIAGNOSIS — E1159 Type 2 diabetes mellitus with other circulatory complications: Principal | ICD-10-CM

## 2019-07-04 DIAGNOSIS — E1122 Type 2 diabetes mellitus with diabetic chronic kidney disease: Principal | ICD-10-CM

## 2019-07-04 DIAGNOSIS — I10 Essential (primary) hypertension: Principal | ICD-10-CM

## 2019-07-08 ENCOUNTER — Ambulatory Visit
Admit: 2019-07-08 | Discharge: 2019-07-09 | Payer: MEDICARE | Attending: Foot and Ankle Surgery | Primary: Foot and Ankle Surgery

## 2019-07-08 ENCOUNTER — Ambulatory Visit: Admit: 2019-07-08 | Discharge: 2019-07-09 | Payer: MEDICARE

## 2019-07-11 ENCOUNTER — Ambulatory Visit: Admit: 2019-07-11 | Discharge: 2019-07-12 | Payer: MEDICARE

## 2019-07-18 ENCOUNTER — Ambulatory Visit
Admit: 2019-07-18 | Discharge: 2019-08-16 | Payer: MEDICARE | Attending: Rehabilitative and Restorative Service Providers" | Primary: Rehabilitative and Restorative Service Providers"

## 2019-07-29 ENCOUNTER — Ambulatory Visit
Admit: 2019-07-29 | Discharge: 2019-07-30 | Payer: MEDICARE | Attending: Cardiovascular Disease | Primary: Cardiovascular Disease

## 2019-07-29 DIAGNOSIS — R002 Palpitations: Principal | ICD-10-CM

## 2019-08-05 ENCOUNTER — Ambulatory Visit
Admit: 2019-08-05 | Discharge: 2019-08-06 | Payer: MEDICARE | Attending: Foot and Ankle Surgery | Primary: Foot and Ankle Surgery

## 2019-08-06 ENCOUNTER — Ambulatory Visit: Admit: 2019-08-06 | Discharge: 2019-08-07 | Payer: MEDICARE

## 2019-08-27 ENCOUNTER — Emergency Department: Admit: 2019-08-27 | Discharge: 2019-08-27 | Disposition: A | Discharge status: Home / Self Care | Payer: MEDICARE

## 2019-08-27 ENCOUNTER — Ambulatory Visit: Admit: 2019-08-27 | Discharge: 2019-08-27 | Disposition: A | Discharge status: Home / Self Care | Payer: MEDICARE

## 2019-08-27 DIAGNOSIS — J069 Acute upper respiratory infection, unspecified: Principal | ICD-10-CM

## 2019-08-27 DIAGNOSIS — S29011A Strain of muscle and tendon of front wall of thorax, initial encounter: Principal | ICD-10-CM

## 2019-08-27 MED ORDER — BENZONATATE 100 MG CAPSULE
ORAL_CAPSULE | Freq: Three times a day (TID) | ORAL | 0 refills | 5 days | Status: CP | PRN
Start: 2019-08-27 — End: ?

## 2019-09-09 ENCOUNTER — Ambulatory Visit: Admit: 2019-09-09 | Discharge: 2019-09-10 | Payer: MEDICARE

## 2019-09-09 MED ORDER — GABAPENTIN 300 MG CAPSULE
ORAL_CAPSULE | Freq: Every evening | ORAL | 3 refills | 90.00000 days | Status: CP
Start: 2019-09-09 — End: ?

## 2019-09-09 MED ORDER — NITROGLYCERIN 0.4 MG SUBLINGUAL TABLET
ORAL_TABLET | SUBLINGUAL | 3 refills | 1.00000 days | Status: CP | PRN
Start: 2019-09-09 — End: ?

## 2019-09-09 MED ORDER — METFORMIN 1,000 MG TABLET
ORAL_TABLET | Freq: Two times a day (BID) | ORAL | 3 refills | 90.00000 days | Status: CP
Start: 2019-09-09 — End: ?

## 2019-09-09 MED ORDER — TAMSULOSIN 0.4 MG CAPSULE
ORAL_CAPSULE | Freq: Every day | ORAL | 3 refills | 90 days | Status: CP
Start: 2019-09-09 — End: ?

## 2019-09-09 MED ORDER — HYDROCODONE 7.5 MG-ACETAMINOPHEN 325 MG TABLET: 1 | tablet | Freq: Four times a day (QID) | 0 refills | 28 days | Status: AC

## 2019-09-09 MED ORDER — OMEPRAZOLE 20 MG CAPSULE,DELAYED RELEASE
ORAL_CAPSULE | Freq: Every day | ORAL | 3 refills | 90.00000 days | Status: CP
Start: 2019-09-09 — End: ?

## 2019-09-09 MED ORDER — TRAZODONE 150 MG TABLET
ORAL_TABLET | Freq: Every evening | ORAL | 3 refills | 90 days | Status: CP
Start: 2019-09-09 — End: ?

## 2019-09-09 MED ORDER — HYDROCHLOROTHIAZIDE 25 MG TABLET
ORAL_TABLET | Freq: Every day | ORAL | 3 refills | 90 days | Status: CP
Start: 2019-09-09 — End: ?

## 2019-09-09 MED ORDER — TROSPIUM 20 MG TABLET
ORAL_TABLET | Freq: Every evening | ORAL | 3 refills | 90 days | Status: CP
Start: 2019-09-09 — End: 2020-09-08

## 2019-09-09 MED ORDER — CLOPIDOGREL 75 MG TABLET
ORAL_TABLET | Freq: Every day | ORAL | 3 refills | 90 days | Status: CP
Start: 2019-09-09 — End: ?

## 2019-09-09 MED ORDER — GLIPIZIDE 5 MG TABLET
ORAL_TABLET | Freq: Every day | ORAL | 3 refills | 90.00000 days | Status: CP
Start: 2019-09-09 — End: ?

## 2019-09-09 MED ORDER — IRBESARTAN 150 MG TABLET
ORAL_TABLET | Freq: Every day | ORAL | 3 refills | 90.00000 days | Status: CP
Start: 2019-09-09 — End: ?

## 2019-09-09 MED ORDER — SERTRALINE 100 MG TABLET
ORAL_TABLET | Freq: Every day | ORAL | 3 refills | 90 days | Status: CP
Start: 2019-09-09 — End: 2020-09-08

## 2019-09-09 MED ORDER — METOPROLOL SUCCINATE ER 100 MG TABLET,EXTENDED RELEASE 24 HR
ORAL_TABLET | Freq: Every day | ORAL | 3 refills | 90.00000 days | Status: CP
Start: 2019-09-09 — End: ?

## 2019-09-09 MED ORDER — HYDROCODONE 7.5 MG-ACETAMINOPHEN 325 MG TABLET
ORAL_TABLET | Freq: Four times a day (QID) | ORAL | 0 refills | 28.00000 days | Status: CP | PRN
Start: 2019-09-09 — End: ?

## 2019-09-09 MED ORDER — DICLOFENAC 1 % TOPICAL GEL
Freq: Four times a day (QID) | TOPICAL | 2 refills | 13 days | Status: CP
Start: 2019-09-09 — End: ?

## 2019-09-23 MED ORDER — CLOPIDOGREL 75 MG TABLET
ORAL_TABLET | 2 refills | 0 days | Status: CP
Start: 2019-09-23 — End: ?

## 2019-10-03 DIAGNOSIS — K219 Gastro-esophageal reflux disease without esophagitis: Principal | ICD-10-CM

## 2019-10-03 MED ORDER — TAMSULOSIN 0.4 MG CAPSULE
ORAL_CAPSULE | 1 refills | 0 days | Status: CP
Start: 2019-10-03 — End: ?

## 2019-10-03 MED ORDER — IRBESARTAN 150 MG TABLET
ORAL_TABLET | 1 refills | 0 days | Status: CP
Start: 2019-10-03 — End: ?

## 2019-10-03 MED ORDER — OMEPRAZOLE 20 MG CAPSULE,DELAYED RELEASE
ORAL_CAPSULE | 1 refills | 0 days | Status: CP
Start: 2019-10-03 — End: ?

## 2019-10-03 MED ORDER — TRAZODONE 150 MG TABLET
ORAL_TABLET | 1 refills | 0 days | Status: CP
Start: 2019-10-03 — End: ?

## 2019-10-14 ENCOUNTER — Ambulatory Visit: Admit: 2019-10-14 | Discharge: 2019-10-15 | Payer: MEDICARE

## 2019-10-14 DIAGNOSIS — L299 Pruritus, unspecified: Principal | ICD-10-CM

## 2019-10-14 DIAGNOSIS — L570 Actinic keratosis: Principal | ICD-10-CM

## 2019-10-14 MED ORDER — GABAPENTIN 300 MG CAPSULE
ORAL_CAPSULE | 3 refills | 0 days | Status: CP
Start: 2019-10-14 — End: ?

## 2019-11-19 DIAGNOSIS — I10 Essential (primary) hypertension: Principal | ICD-10-CM

## 2019-11-19 DIAGNOSIS — E1122 Type 2 diabetes mellitus with diabetic chronic kidney disease: Principal | ICD-10-CM

## 2019-11-19 DIAGNOSIS — N182 Chronic kidney disease, stage 2 (mild): Principal | ICD-10-CM

## 2019-11-19 DIAGNOSIS — E1159 Type 2 diabetes mellitus with other circulatory complications: Principal | ICD-10-CM

## 2019-12-05 ENCOUNTER — Ambulatory Visit: Admit: 2019-12-05 | Discharge: 2019-12-06 | Payer: MEDICARE

## 2019-12-05 MED ORDER — HYDROCODONE 7.5 MG-ACETAMINOPHEN 325 MG TABLET
ORAL_TABLET | Freq: Four times a day (QID) | ORAL | 0 refills | 28.00000 days | Status: CP | PRN
Start: 2019-12-05 — End: ?

## 2019-12-05 MED ORDER — HYDROCODONE 7.5 MG-ACETAMINOPHEN 325 MG TABLET: 1 | tablet | Freq: Four times a day (QID) | 0 refills | 28 days | Status: AC

## 2020-01-29 ENCOUNTER — Ambulatory Visit: Admit: 2020-01-29 | Discharge: 2020-01-30 | Payer: MEDICARE

## 2020-02-02 ENCOUNTER — Ambulatory Visit: Admit: 2020-02-02 | Discharge: 2020-02-03 | Payer: MEDICARE

## 2020-02-02 DIAGNOSIS — E1159 Type 2 diabetes mellitus with other circulatory complications: Principal | ICD-10-CM

## 2020-02-02 DIAGNOSIS — F331 Major depressive disorder, recurrent, moderate: Principal | ICD-10-CM

## 2020-02-02 DIAGNOSIS — M549 Dorsalgia, unspecified: Principal | ICD-10-CM

## 2020-02-02 DIAGNOSIS — G2581 Restless legs syndrome: Principal | ICD-10-CM

## 2020-02-02 DIAGNOSIS — G8929 Other chronic pain: Principal | ICD-10-CM

## 2020-02-02 DIAGNOSIS — I152 Hypertension secondary to endocrine disorders: Principal | ICD-10-CM

## 2020-02-02 MED ORDER — HYDROCODONE 7.5 MG-ACETAMINOPHEN 325 MG TABLET: 1 | tablet | Freq: Four times a day (QID) | 0 refills | 28 days | Status: AC

## 2020-02-02 MED ORDER — HYDROCODONE 7.5 MG-ACETAMINOPHEN 325 MG TABLET
ORAL_TABLET | Freq: Four times a day (QID) | ORAL | 0 refills | 28.00000 days | Status: CP | PRN
Start: 2020-02-02 — End: ?

## 2020-02-11 ENCOUNTER — Ambulatory Visit: Admit: 2020-02-11 | Discharge: 2020-02-12 | Payer: MEDICARE

## 2020-02-12 ENCOUNTER — Telehealth: Admit: 2020-02-12 | Discharge: 2020-02-13 | Payer: MEDICARE | Attending: Clinical | Primary: Clinical

## 2020-02-19 ENCOUNTER — Telehealth: Admit: 2020-02-19 | Discharge: 2020-02-20 | Payer: MEDICARE | Attending: Clinical | Primary: Clinical

## 2020-03-01 DIAGNOSIS — I152 Hypertension secondary to endocrine disorders: Principal | ICD-10-CM

## 2020-03-01 DIAGNOSIS — E1122 Type 2 diabetes mellitus with diabetic chronic kidney disease: Principal | ICD-10-CM

## 2020-03-01 DIAGNOSIS — E1159 Type 2 diabetes mellitus with other circulatory complications: Principal | ICD-10-CM

## 2020-03-01 DIAGNOSIS — N182 Chronic kidney disease, stage 2 (mild): Principal | ICD-10-CM

## 2020-03-02 ENCOUNTER — Ambulatory Visit: Admit: 2020-03-02 | Discharge: 2020-03-03 | Payer: MEDICARE

## 2020-03-02 DIAGNOSIS — M549 Dorsalgia, unspecified: Principal | ICD-10-CM

## 2020-03-02 DIAGNOSIS — I152 Hypertension secondary to endocrine disorders: Principal | ICD-10-CM

## 2020-03-02 DIAGNOSIS — E1159 Type 2 diabetes mellitus with other circulatory complications: Principal | ICD-10-CM

## 2020-03-02 DIAGNOSIS — G8929 Other chronic pain: Principal | ICD-10-CM

## 2020-03-09 ENCOUNTER — Ambulatory Visit: Admit: 2020-03-09 | Discharge: 2020-03-10 | Payer: MEDICARE

## 2020-03-09 DIAGNOSIS — M47816 Spondylosis without myelopathy or radiculopathy, lumbar region: Principal | ICD-10-CM

## 2020-03-09 DIAGNOSIS — M5416 Radiculopathy, lumbar region: Principal | ICD-10-CM

## 2020-03-09 DIAGNOSIS — E882 Lipomatosis, not elsewhere classified: Principal | ICD-10-CM

## 2020-03-09 DIAGNOSIS — G8929 Other chronic pain: Principal | ICD-10-CM

## 2020-03-09 DIAGNOSIS — M5442 Lumbago with sciatica, left side: Principal | ICD-10-CM

## 2020-03-09 DIAGNOSIS — R609 Edema, unspecified: Principal | ICD-10-CM

## 2020-03-11 ENCOUNTER — Telehealth: Admit: 2020-03-11 | Discharge: 2020-03-12 | Payer: MEDICARE | Attending: Clinical | Primary: Clinical

## 2020-03-11 ENCOUNTER — Ambulatory Visit: Admit: 2020-03-11 | Discharge: 2020-03-12 | Payer: MEDICARE

## 2020-03-11 DIAGNOSIS — M5442 Lumbago with sciatica, left side: Principal | ICD-10-CM

## 2020-03-11 DIAGNOSIS — G8929 Other chronic pain: Principal | ICD-10-CM

## 2020-03-11 DIAGNOSIS — M47816 Spondylosis without myelopathy or radiculopathy, lumbar region: Principal | ICD-10-CM

## 2020-03-30 ENCOUNTER — Ambulatory Visit: Admit: 2020-03-30 | Discharge: 2020-03-31 | Payer: MEDICARE

## 2020-03-30 DIAGNOSIS — I152 Hypertension secondary to endocrine disorders: Principal | ICD-10-CM

## 2020-03-30 DIAGNOSIS — I5022 Chronic systolic (congestive) heart failure: Principal | ICD-10-CM

## 2020-03-30 DIAGNOSIS — I25119 Atherosclerotic heart disease of native coronary artery with unspecified angina pectoris: Principal | ICD-10-CM

## 2020-03-30 DIAGNOSIS — E1159 Type 2 diabetes mellitus with other circulatory complications: Principal | ICD-10-CM

## 2020-03-30 DIAGNOSIS — R7989 Other specified abnormal findings of blood chemistry: Principal | ICD-10-CM

## 2020-03-30 DIAGNOSIS — E1122 Type 2 diabetes mellitus with diabetic chronic kidney disease: Principal | ICD-10-CM

## 2020-03-30 DIAGNOSIS — N182 Chronic kidney disease, stage 2 (mild): Principal | ICD-10-CM

## 2020-04-05 ENCOUNTER — Ambulatory Visit: Admit: 2020-04-05 | Discharge: 2020-04-05 | Payer: MEDICARE

## 2020-04-05 DIAGNOSIS — Z7984 Long term (current) use of oral hypoglycemic drugs: Principal | ICD-10-CM

## 2020-04-05 DIAGNOSIS — R3915 Urgency of urination: Principal | ICD-10-CM

## 2020-04-05 DIAGNOSIS — M1990 Unspecified osteoarthritis, unspecified site: Principal | ICD-10-CM

## 2020-04-05 DIAGNOSIS — R350 Frequency of micturition: Principal | ICD-10-CM

## 2020-04-05 DIAGNOSIS — I509 Heart failure, unspecified: Principal | ICD-10-CM

## 2020-04-05 DIAGNOSIS — Z79899 Other long term (current) drug therapy: Principal | ICD-10-CM

## 2020-04-05 DIAGNOSIS — R39198 Other difficulties with micturition: Principal | ICD-10-CM

## 2020-04-05 DIAGNOSIS — N39498 Other specified urinary incontinence: Principal | ICD-10-CM

## 2020-04-05 DIAGNOSIS — E119 Type 2 diabetes mellitus without complications: Principal | ICD-10-CM

## 2020-04-05 DIAGNOSIS — R338 Other retention of urine: Principal | ICD-10-CM

## 2020-04-05 DIAGNOSIS — F419 Anxiety disorder, unspecified: Principal | ICD-10-CM

## 2020-04-05 DIAGNOSIS — I110 Hypertensive heart disease with heart failure: Principal | ICD-10-CM

## 2020-04-05 DIAGNOSIS — E782 Mixed hyperlipidemia: Principal | ICD-10-CM

## 2020-04-05 DIAGNOSIS — R972 Elevated prostate specific antigen [PSA]: Principal | ICD-10-CM

## 2020-04-05 DIAGNOSIS — N400 Enlarged prostate without lower urinary tract symptoms: Principal | ICD-10-CM

## 2020-04-05 DIAGNOSIS — Z7982 Long term (current) use of aspirin: Principal | ICD-10-CM

## 2020-04-05 DIAGNOSIS — Z7902 Long term (current) use of antithrombotics/antiplatelets: Principal | ICD-10-CM

## 2020-04-05 DIAGNOSIS — Z955 Presence of coronary angioplasty implant and graft: Principal | ICD-10-CM

## 2020-04-05 DIAGNOSIS — K219 Gastro-esophageal reflux disease without esophagitis: Principal | ICD-10-CM

## 2020-04-05 DIAGNOSIS — Z7952 Long term (current) use of systemic steroids: Principal | ICD-10-CM

## 2020-04-05 DIAGNOSIS — N401 Enlarged prostate with lower urinary tract symptoms: Principal | ICD-10-CM

## 2020-04-05 DIAGNOSIS — Z791 Long term (current) use of non-steroidal anti-inflammatories (NSAID): Principal | ICD-10-CM

## 2020-04-05 DIAGNOSIS — I252 Old myocardial infarction: Principal | ICD-10-CM

## 2020-04-05 MED ORDER — TAMSULOSIN 0.4 MG CAPSULE
ORAL_CAPSULE | Freq: Every day | ORAL | 3 refills | 90 days | Status: CP
Start: 2020-04-05 — End: ?

## 2020-04-05 MED ORDER — TROSPIUM 20 MG TABLET
ORAL_TABLET | Freq: Every evening | ORAL | 3 refills | 90 days | Status: CP
Start: 2020-04-05 — End: 2021-04-05

## 2020-05-04 ENCOUNTER — Ambulatory Visit
Admit: 2020-05-04 | Discharge: 2020-05-05 | Payer: MEDICARE | Attending: Physical Medicine & Rehabilitation | Primary: Physical Medicine & Rehabilitation

## 2020-05-04 DIAGNOSIS — M47816 Spondylosis without myelopathy or radiculopathy, lumbar region: Principal | ICD-10-CM

## 2020-05-04 DIAGNOSIS — M5136 Other intervertebral disc degeneration, lumbar region: Principal | ICD-10-CM

## 2020-05-06 DIAGNOSIS — I25119 Atherosclerotic heart disease of native coronary artery with unspecified angina pectoris: Principal | ICD-10-CM

## 2020-05-06 DIAGNOSIS — I152 Hypertension secondary to endocrine disorders: Principal | ICD-10-CM

## 2020-05-06 DIAGNOSIS — N182 Chronic kidney disease, stage 2 (mild): Principal | ICD-10-CM

## 2020-05-06 DIAGNOSIS — E1159 Type 2 diabetes mellitus with other circulatory complications: Principal | ICD-10-CM

## 2020-05-06 DIAGNOSIS — E1122 Type 2 diabetes mellitus with diabetic chronic kidney disease: Principal | ICD-10-CM

## 2020-05-13 ENCOUNTER — Ambulatory Visit
Admit: 2020-05-13 | Discharge: 2020-05-14 | Payer: MEDICARE | Attending: Foot and Ankle Surgery | Primary: Foot and Ankle Surgery

## 2020-05-13 DIAGNOSIS — M722 Plantar fascial fibromatosis: Principal | ICD-10-CM

## 2020-05-18 ENCOUNTER — Ambulatory Visit: Admit: 2020-05-18 | Discharge: 2020-05-19 | Payer: MEDICARE

## 2020-05-18 ENCOUNTER — Ambulatory Visit
Admit: 2020-05-18 | Discharge: 2020-05-19 | Payer: MEDICARE | Attending: Foot and Ankle Surgery | Primary: Foot and Ankle Surgery

## 2020-05-18 DIAGNOSIS — M549 Dorsalgia, unspecified: Principal | ICD-10-CM

## 2020-05-18 DIAGNOSIS — R972 Elevated prostate specific antigen [PSA]: Principal | ICD-10-CM

## 2020-05-18 DIAGNOSIS — N182 Chronic kidney disease, stage 2 (mild): Principal | ICD-10-CM

## 2020-05-18 DIAGNOSIS — E1159 Type 2 diabetes mellitus with other circulatory complications: Principal | ICD-10-CM

## 2020-05-18 DIAGNOSIS — G8929 Other chronic pain: Principal | ICD-10-CM

## 2020-05-18 DIAGNOSIS — E1122 Type 2 diabetes mellitus with diabetic chronic kidney disease: Principal | ICD-10-CM

## 2020-05-18 DIAGNOSIS — I152 Hypertension secondary to endocrine disorders: Principal | ICD-10-CM

## 2020-05-18 MED ORDER — IRBESARTAN 300 MG TABLET
ORAL_TABLET | Freq: Every day | ORAL | 3 refills | 90 days | Status: CP
Start: 2020-05-18 — End: 2021-05-18

## 2020-05-18 MED ORDER — HYDROCODONE 7.5 MG-ACETAMINOPHEN 325 MG TABLET
ORAL_TABLET | Freq: Four times a day (QID) | ORAL | 0 refills | 28.00000 days | Status: CP | PRN
Start: 2020-05-18 — End: 2020-06-10

## 2020-05-19 ENCOUNTER — Ambulatory Visit: Admit: 2020-05-19 | Discharge: 2020-05-20 | Payer: MEDICARE

## 2020-05-19 ENCOUNTER — Ambulatory Visit
Admit: 2020-05-19 | Discharge: 2020-05-20 | Payer: MEDICARE | Attending: Physical Medicine & Rehabilitation | Primary: Physical Medicine & Rehabilitation

## 2020-05-19 DIAGNOSIS — M5136 Other intervertebral disc degeneration, lumbar region: Principal | ICD-10-CM

## 2020-05-19 DIAGNOSIS — Z888 Allergy status to other drugs, medicaments and biological substances status: Principal | ICD-10-CM

## 2020-05-19 DIAGNOSIS — M129 Arthropathy, unspecified: Principal | ICD-10-CM

## 2020-05-19 DIAGNOSIS — M47816 Spondylosis without myelopathy or radiculopathy, lumbar region: Principal | ICD-10-CM

## 2020-05-19 DIAGNOSIS — M47817 Spondylosis without myelopathy or radiculopathy, lumbosacral region: Principal | ICD-10-CM

## 2020-06-03 MED ORDER — AMLODIPINE 5 MG TABLET
ORAL_TABLET | Freq: Every day | ORAL | 0 refills | 30.00000 days | Status: CP
Start: 2020-06-03 — End: 2021-06-03

## 2020-06-10 ENCOUNTER — Ambulatory Visit: Admit: 2020-06-10 | Discharge: 2020-06-10 | Disposition: A | Discharge status: Home / Self Care | Payer: MEDICARE

## 2020-06-10 ENCOUNTER — Ambulatory Visit: Admit: 2020-06-10 | Discharge: 2020-06-11 | Disposition: A | Discharge status: Home / Self Care | Payer: MEDICARE

## 2020-06-10 ENCOUNTER — Emergency Department: Admit: 2020-06-10 | Discharge: 2020-06-10 | Disposition: A | Discharge status: Home / Self Care | Payer: MEDICARE

## 2020-06-10 DIAGNOSIS — G459 Transient cerebral ischemic attack, unspecified: Principal | ICD-10-CM

## 2020-06-10 MED ORDER — ASPIRIN 81 MG TABLET,DELAYED RELEASE
Freq: Every day | ORAL | 0 refills | 0 days
Start: 2020-06-10 — End: ?

## 2020-06-16 DIAGNOSIS — N182 Chronic kidney disease, stage 2 (mild): Principal | ICD-10-CM

## 2020-06-16 DIAGNOSIS — E1122 Type 2 diabetes mellitus with diabetic chronic kidney disease: Principal | ICD-10-CM

## 2020-06-16 MED ORDER — ONETOUCH ULTRA TEST STRIPS
ORAL_STRIP | 5 refills | 0 days | Status: CP
Start: 2020-06-16 — End: ?

## 2020-06-21 ENCOUNTER — Ambulatory Visit: Admit: 2020-06-21 | Discharge: 2020-06-21 | Payer: MEDICARE

## 2020-06-21 DIAGNOSIS — N182 Chronic kidney disease, stage 2 (mild): Principal | ICD-10-CM

## 2020-06-21 DIAGNOSIS — E1122 Type 2 diabetes mellitus with diabetic chronic kidney disease: Principal | ICD-10-CM

## 2020-06-21 DIAGNOSIS — F32A Depression, unspecified depression type: Principal | ICD-10-CM

## 2020-06-21 DIAGNOSIS — G459 Transient cerebral ischemic attack, unspecified: Principal | ICD-10-CM

## 2020-06-21 DIAGNOSIS — E1159 Type 2 diabetes mellitus with other circulatory complications: Principal | ICD-10-CM

## 2020-06-21 DIAGNOSIS — I152 Hypertension secondary to endocrine disorders: Principal | ICD-10-CM

## 2020-06-21 DIAGNOSIS — E876 Hypokalemia: Principal | ICD-10-CM

## 2020-06-21 MED ORDER — AMLODIPINE 10 MG TABLET
ORAL_TABLET | Freq: Every day | ORAL | 3 refills | 90.00000 days | Status: CP
Start: 2020-06-21 — End: 2021-06-21

## 2020-06-21 MED ORDER — SERTRALINE 100 MG TABLET
ORAL_TABLET | Freq: Every day | ORAL | 3 refills | 90 days | Status: CP
Start: 2020-06-21 — End: 2021-06-21

## 2020-06-24 ENCOUNTER — Ambulatory Visit: Admit: 2020-06-24 | Discharge: 2020-06-25 | Payer: MEDICARE | Attending: Internal Medicine | Primary: Internal Medicine

## 2020-06-24 DIAGNOSIS — Z8673 Personal history of transient ischemic attack (TIA), and cerebral infarction without residual deficits: Principal | ICD-10-CM

## 2020-06-24 DIAGNOSIS — E1122 Type 2 diabetes mellitus with diabetic chronic kidney disease: Principal | ICD-10-CM

## 2020-06-24 DIAGNOSIS — I2510 Atherosclerotic heart disease of native coronary artery without angina pectoris: Principal | ICD-10-CM

## 2020-06-24 DIAGNOSIS — N182 Chronic kidney disease, stage 2 (mild): Principal | ICD-10-CM

## 2020-06-24 DIAGNOSIS — I10 Essential (primary) hypertension: Principal | ICD-10-CM

## 2020-06-24 MED ORDER — SPIRONOLACTONE 25 MG TABLET
ORAL_TABLET | Freq: Every day | ORAL | 11 refills | 30 days | Status: CP
Start: 2020-06-24 — End: 2021-06-24

## 2020-07-13 ENCOUNTER — Ambulatory Visit: Admit: 2020-07-13 | Discharge: 2020-07-14 | Payer: MEDICARE

## 2020-07-13 DIAGNOSIS — L989 Disorder of the skin and subcutaneous tissue, unspecified: Principal | ICD-10-CM

## 2020-07-13 MED ORDER — HYDROCODONE 7.5 MG-ACETAMINOPHEN 325 MG TABLET
ORAL_TABLET | Freq: Four times a day (QID) | ORAL | 0 refills | 28 days | Status: CP | PRN
Start: 2020-07-13 — End: ?

## 2020-07-15 ENCOUNTER — Ambulatory Visit: Admit: 2020-07-15 | Discharge: 2020-07-16 | Payer: MEDICARE | Attending: Internal Medicine | Primary: Internal Medicine

## 2020-07-15 DIAGNOSIS — N529 Male erectile dysfunction, unspecified: Principal | ICD-10-CM

## 2020-07-15 DIAGNOSIS — Z8673 Personal history of transient ischemic attack (TIA), and cerebral infarction without residual deficits: Principal | ICD-10-CM

## 2020-07-15 DIAGNOSIS — N182 Chronic kidney disease, stage 2 (mild): Principal | ICD-10-CM

## 2020-07-15 DIAGNOSIS — E1122 Type 2 diabetes mellitus with diabetic chronic kidney disease: Principal | ICD-10-CM

## 2020-07-15 DIAGNOSIS — I2510 Atherosclerotic heart disease of native coronary artery without angina pectoris: Principal | ICD-10-CM

## 2020-07-15 DIAGNOSIS — I10 Essential (primary) hypertension: Principal | ICD-10-CM

## 2020-07-15 MED ORDER — SILDENAFIL 100 MG TABLET
ORAL_TABLET | Freq: Every day | ORAL | 1 refills | 30 days | Status: CP | PRN
Start: 2020-07-15 — End: 2020-09-13

## 2020-08-06 ENCOUNTER — Ambulatory Visit
Admit: 2020-08-06 | Discharge: 2020-08-07 | Payer: MEDICARE | Attending: Orthopaedic Surgery | Primary: Orthopaedic Surgery

## 2020-08-25 DIAGNOSIS — G8929 Other chronic pain: Principal | ICD-10-CM

## 2020-08-25 DIAGNOSIS — M549 Dorsalgia, unspecified: Principal | ICD-10-CM

## 2020-08-25 MED ORDER — DICLOFENAC 1 % TOPICAL GEL
Freq: Four times a day (QID) | TOPICAL | 11 refills | 13 days | Status: CP
Start: 2020-08-25 — End: ?

## 2020-08-27 DIAGNOSIS — G8929 Other chronic pain: Principal | ICD-10-CM

## 2020-08-27 DIAGNOSIS — M549 Dorsalgia, unspecified: Principal | ICD-10-CM

## 2020-09-06 DIAGNOSIS — E1159 Type 2 diabetes mellitus with other circulatory complications: Principal | ICD-10-CM

## 2020-09-06 DIAGNOSIS — I152 Hypertension secondary to endocrine disorders: Principal | ICD-10-CM

## 2020-09-06 DIAGNOSIS — E1122 Type 2 diabetes mellitus with diabetic chronic kidney disease: Principal | ICD-10-CM

## 2020-09-06 DIAGNOSIS — N182 Chronic kidney disease, stage 2 (mild): Principal | ICD-10-CM

## 2020-09-14 DIAGNOSIS — I10 Essential (primary) hypertension: Principal | ICD-10-CM

## 2020-09-14 MED ORDER — METOPROLOL SUCCINATE ER 100 MG TABLET,EXTENDED RELEASE 24 HR
ORAL_TABLET | 0 refills | 0 days | Status: CP
Start: 2020-09-14 — End: ?

## 2020-09-15 ENCOUNTER — Ambulatory Visit: Admit: 2020-09-15 | Payer: MEDICARE | Attending: Clinical | Primary: Clinical

## 2020-10-06 ENCOUNTER — Telehealth: Admit: 2020-10-06 | Discharge: 2020-10-07 | Payer: MEDICARE | Attending: Clinical | Primary: Clinical

## 2020-10-06 DIAGNOSIS — G8929 Other chronic pain: Principal | ICD-10-CM

## 2020-10-15 ENCOUNTER — Ambulatory Visit: Admit: 2020-10-15 | Discharge: 2020-10-16 | Payer: MEDICARE

## 2020-10-15 DIAGNOSIS — I25119 Atherosclerotic heart disease of native coronary artery with unspecified angina pectoris: Principal | ICD-10-CM

## 2020-10-15 DIAGNOSIS — E1122 Type 2 diabetes mellitus with diabetic chronic kidney disease: Principal | ICD-10-CM

## 2020-10-15 DIAGNOSIS — I152 Hypertension secondary to endocrine disorders: Principal | ICD-10-CM

## 2020-10-15 DIAGNOSIS — G8929 Other chronic pain: Principal | ICD-10-CM

## 2020-10-15 DIAGNOSIS — N182 Chronic kidney disease, stage 2 (mild): Principal | ICD-10-CM

## 2020-10-15 DIAGNOSIS — E1159 Type 2 diabetes mellitus with other circulatory complications: Principal | ICD-10-CM

## 2020-10-15 DIAGNOSIS — M549 Dorsalgia, unspecified: Principal | ICD-10-CM

## 2020-10-15 MED ORDER — NITROGLYCERIN 0.4 MG SUBLINGUAL TABLET
ORAL_TABLET | SUBLINGUAL | 3 refills | 1 days | Status: CP | PRN
Start: 2020-10-15 — End: ?

## 2020-10-15 MED ORDER — GABAPENTIN 300 MG CAPSULE
ORAL_CAPSULE | 3 refills | 0 days | Status: CP
Start: 2020-10-15 — End: ?

## 2020-10-15 MED ORDER — HYDROCODONE 7.5 MG-ACETAMINOPHEN 325 MG TABLET
ORAL_TABLET | Freq: Four times a day (QID) | ORAL | 0 refills | 28.00000 days | Status: CP | PRN
Start: 2020-10-15 — End: ?

## 2020-10-15 MED ORDER — TRAZODONE 150 MG TABLET
ORAL_TABLET | Freq: Every evening | ORAL | 3 refills | 90 days | Status: CP
Start: 2020-10-15 — End: ?

## 2020-10-15 MED ORDER — METFORMIN 1,000 MG TABLET
ORAL_TABLET | Freq: Two times a day (BID) | ORAL | 3 refills | 90 days | Status: CP
Start: 2020-10-15 — End: ?

## 2020-10-15 MED ORDER — TAMSULOSIN 0.4 MG CAPSULE
ORAL_CAPSULE | Freq: Every day | ORAL | 3 refills | 90 days | Status: CP
Start: 2020-10-15 — End: ?

## 2020-10-26 ENCOUNTER — Ambulatory Visit: Admit: 2020-10-26 | Discharge: 2020-10-27 | Payer: MEDICARE

## 2020-10-26 DIAGNOSIS — L989 Disorder of the skin and subcutaneous tissue, unspecified: Principal | ICD-10-CM

## 2020-10-26 DIAGNOSIS — L570 Actinic keratosis: Principal | ICD-10-CM

## 2020-10-26 DIAGNOSIS — L291 Pruritus scroti: Principal | ICD-10-CM

## 2020-11-06 MED ORDER — OMEPRAZOLE 20 MG CAPSULE,DELAYED RELEASE
ORAL_CAPSULE | 3 refills | 0 days | Status: CP
Start: 2020-11-06 — End: ?

## 2020-11-15 ENCOUNTER — Ambulatory Visit: Admit: 2020-11-15 | Discharge: 2020-11-16 | Payer: MEDICARE

## 2020-11-26 DIAGNOSIS — I10 Essential (primary) hypertension: Principal | ICD-10-CM

## 2020-11-29 MED ORDER — METOPROLOL SUCCINATE ER 100 MG TABLET,EXTENDED RELEASE 24 HR
ORAL_TABLET | 0 refills | 0 days | Status: CP
Start: 2020-11-29 — End: ?

## 2020-12-28 ENCOUNTER — Ambulatory Visit: Admit: 2020-12-28 | Discharge: 2020-12-29 | Payer: MEDICARE

## 2020-12-28 MED ORDER — HYDROCODONE 7.5 MG-ACETAMINOPHEN 325 MG TABLET
ORAL_TABLET | Freq: Four times a day (QID) | ORAL | 0 refills | 28.00000 days | Status: CP | PRN
Start: 2020-12-28 — End: ?

## 2020-12-31 ENCOUNTER — Ambulatory Visit: Admit: 2020-12-31 | Discharge: 2021-01-01 | Payer: MEDICARE

## 2021-03-01 MED ORDER — OMEPRAZOLE 20 MG CAPSULE,DELAYED RELEASE
ORAL_CAPSULE | Freq: Every day | ORAL | 3 refills | 90 days | Status: CP
Start: 2021-03-01 — End: ?

## 2021-03-08 DIAGNOSIS — G8929 Other chronic pain: Principal | ICD-10-CM

## 2021-03-08 DIAGNOSIS — M549 Dorsalgia, unspecified: Principal | ICD-10-CM

## 2021-03-08 MED ORDER — HYDROCODONE 7.5 MG-ACETAMINOPHEN 325 MG TABLET
ORAL_TABLET | Freq: Four times a day (QID) | ORAL | 0 refills | 28.00000 days | Status: CP | PRN
Start: 2021-03-08 — End: ?

## 2021-04-05 ENCOUNTER — Ambulatory Visit: Admit: 2021-04-05 | Discharge: 2021-04-06 | Payer: MEDICARE

## 2021-04-11 DIAGNOSIS — I25119 Atherosclerotic heart disease of native coronary artery with unspecified angina pectoris: Principal | ICD-10-CM

## 2021-04-11 DIAGNOSIS — F32A Depression, unspecified depression type: Principal | ICD-10-CM

## 2021-04-11 DIAGNOSIS — I10 Essential (primary) hypertension: Principal | ICD-10-CM

## 2021-04-11 DIAGNOSIS — M549 Dorsalgia, unspecified: Principal | ICD-10-CM

## 2021-04-11 DIAGNOSIS — E1122 Type 2 diabetes mellitus with diabetic chronic kidney disease: Principal | ICD-10-CM

## 2021-04-11 DIAGNOSIS — N182 Chronic kidney disease, stage 2 (mild): Principal | ICD-10-CM

## 2021-04-11 DIAGNOSIS — G8929 Other chronic pain: Principal | ICD-10-CM

## 2021-04-11 MED ORDER — CLOPIDOGREL 75 MG TABLET
ORAL_TABLET | Freq: Every day | ORAL | 3 refills | 90 days | Status: CP
Start: 2021-04-11 — End: ?

## 2021-04-11 MED ORDER — METOPROLOL SUCCINATE ER 100 MG TABLET,EXTENDED RELEASE 24 HR
ORAL_TABLET | Freq: Every day | ORAL | 3 refills | 90 days | Status: CP
Start: 2021-04-11 — End: ?

## 2021-04-11 MED ORDER — IRBESARTAN 300 MG TABLET
ORAL_TABLET | Freq: Every day | ORAL | 3 refills | 90 days | Status: CP
Start: 2021-04-11 — End: 2022-04-11

## 2021-04-11 MED ORDER — SERTRALINE 100 MG TABLET
ORAL_TABLET | Freq: Every day | ORAL | 3 refills | 90 days | Status: CP
Start: 2021-04-11 — End: 2022-04-11

## 2021-04-11 MED ORDER — NITROGLYCERIN 0.4 MG SUBLINGUAL TABLET
ORAL_TABLET | SUBLINGUAL | 3 refills | 1 days | Status: CP | PRN
Start: 2021-04-11 — End: ?

## 2021-04-11 MED ORDER — GABAPENTIN 300 MG CAPSULE
ORAL_CAPSULE | 3 refills | 0 days | Status: CP
Start: 2021-04-11 — End: ?

## 2021-04-11 MED ORDER — DICLOFENAC 1 % TOPICAL GEL
Freq: Four times a day (QID) | TOPICAL | 11 refills | 13.00000 days | Status: CP
Start: 2021-04-11 — End: ?

## 2021-04-11 MED ORDER — METFORMIN 1,000 MG TABLET
ORAL_TABLET | Freq: Two times a day (BID) | ORAL | 3 refills | 90 days | Status: CP
Start: 2021-04-11 — End: ?

## 2021-04-11 MED ORDER — AMLODIPINE 10 MG TABLET
ORAL_TABLET | Freq: Every day | ORAL | 3 refills | 90 days | Status: CP
Start: 2021-04-11 — End: 2022-04-11

## 2021-04-11 MED ORDER — TRAZODONE 150 MG TABLET
ORAL_TABLET | Freq: Every evening | ORAL | 3 refills | 90 days | Status: CP
Start: 2021-04-11 — End: ?

## 2021-04-11 MED ORDER — TAMSULOSIN 0.4 MG CAPSULE
ORAL_CAPSULE | Freq: Every day | ORAL | 3 refills | 90 days | Status: CP
Start: 2021-04-11 — End: ?

## 2021-04-19 ENCOUNTER — Ambulatory Visit: Admit: 2021-04-19 | Discharge: 2021-04-20 | Payer: MEDICARE

## 2021-04-19 MED ORDER — AMOXICILLIN 875 MG-POTASSIUM CLAVULANATE 125 MG TABLET
ORAL_TABLET | Freq: Two times a day (BID) | ORAL | 0 refills | 7 days | Status: CP
Start: 2021-04-19 — End: 2021-04-26

## 2021-04-27 DIAGNOSIS — I10 Essential (primary) hypertension: Principal | ICD-10-CM

## 2021-04-27 DIAGNOSIS — N182 Chronic kidney disease, stage 2 (mild): Principal | ICD-10-CM

## 2021-04-27 DIAGNOSIS — E1122 Type 2 diabetes mellitus with diabetic chronic kidney disease: Principal | ICD-10-CM

## 2021-05-10 MED ORDER — IRBESARTAN 300 MG TABLET
ORAL_TABLET | Freq: Every day | ORAL | 3 refills | 90 days | Status: CP
Start: 2021-05-10 — End: 2022-05-10

## 2021-05-10 MED ORDER — AMLODIPINE 10 MG TABLET
ORAL_TABLET | Freq: Every day | ORAL | 3 refills | 90 days | Status: CP
Start: 2021-05-10 — End: 2022-05-10

## 2021-05-10 MED ORDER — METOPROLOL SUCCINATE ER 100 MG TABLET,EXTENDED RELEASE 24 HR
ORAL_TABLET | Freq: Every day | ORAL | 3 refills | 90.00000 days | Status: CP
Start: 2021-05-10 — End: ?

## 2021-05-10 MED ORDER — SPIRONOLACTONE 25 MG TABLET
ORAL_TABLET | Freq: Every day | ORAL | 3 refills | 90 days | Status: CP
Start: 2021-05-10 — End: 2022-05-10

## 2021-05-10 MED ORDER — CLOPIDOGREL 75 MG TABLET
ORAL_TABLET | Freq: Every day | ORAL | 3 refills | 90 days | Status: CP
Start: 2021-05-10 — End: ?

## 2021-05-11 ENCOUNTER — Emergency Department
Admit: 2021-05-11 | Discharge: 2021-05-11 | Disposition: A | Discharge status: Home / Self Care | Payer: MEDICARE | Attending: Medical

## 2021-05-11 ENCOUNTER — Ambulatory Visit
Admit: 2021-05-11 | Discharge: 2021-05-11 | Disposition: A | Discharge status: Home / Self Care | Payer: MEDICARE | Attending: Medical

## 2021-05-11 ENCOUNTER — Ambulatory Visit: Admit: 2021-05-11 | Discharge: 2021-05-12 | Payer: MEDICARE | Attending: Internal Medicine | Primary: Internal Medicine

## 2021-05-11 DIAGNOSIS — N182 Chronic kidney disease, stage 2 (mild): Principal | ICD-10-CM

## 2021-05-11 DIAGNOSIS — E1159 Type 2 diabetes mellitus with other circulatory complications: Principal | ICD-10-CM

## 2021-05-11 DIAGNOSIS — Z8673 Personal history of transient ischemic attack (TIA), and cerebral infarction without residual deficits: Principal | ICD-10-CM

## 2021-05-11 DIAGNOSIS — R079 Chest pain, unspecified: Principal | ICD-10-CM

## 2021-05-11 DIAGNOSIS — E1169 Type 2 diabetes mellitus with other specified complication: Principal | ICD-10-CM

## 2021-05-11 DIAGNOSIS — I2510 Atherosclerotic heart disease of native coronary artery without angina pectoris: Principal | ICD-10-CM

## 2021-05-11 DIAGNOSIS — I152 Hypertension secondary to endocrine disorders: Principal | ICD-10-CM

## 2021-05-11 DIAGNOSIS — E785 Hyperlipidemia, unspecified: Principal | ICD-10-CM

## 2021-05-11 DIAGNOSIS — E1122 Type 2 diabetes mellitus with diabetic chronic kidney disease: Principal | ICD-10-CM

## 2021-05-12 DIAGNOSIS — I10 Essential (primary) hypertension: Principal | ICD-10-CM

## 2021-05-12 DIAGNOSIS — I25119 Atherosclerotic heart disease of native coronary artery with unspecified angina pectoris: Principal | ICD-10-CM

## 2021-05-24 ENCOUNTER — Ambulatory Visit: Admit: 2021-05-24 | Discharge: 2021-05-24 | Payer: MEDICARE | Attending: Internal Medicine | Primary: Internal Medicine

## 2021-05-24 DIAGNOSIS — E785 Hyperlipidemia, unspecified: Principal | ICD-10-CM

## 2021-05-24 DIAGNOSIS — I2510 Atherosclerotic heart disease of native coronary artery without angina pectoris: Principal | ICD-10-CM

## 2021-05-24 DIAGNOSIS — N182 Chronic kidney disease, stage 2 (mild): Principal | ICD-10-CM

## 2021-05-24 DIAGNOSIS — E1159 Type 2 diabetes mellitus with other circulatory complications: Principal | ICD-10-CM

## 2021-05-24 DIAGNOSIS — E1169 Type 2 diabetes mellitus with other specified complication: Principal | ICD-10-CM

## 2021-05-24 DIAGNOSIS — R0602 Shortness of breath: Principal | ICD-10-CM

## 2021-05-24 DIAGNOSIS — E1122 Type 2 diabetes mellitus with diabetic chronic kidney disease: Principal | ICD-10-CM

## 2021-05-24 DIAGNOSIS — I152 Hypertension secondary to endocrine disorders: Principal | ICD-10-CM

## 2021-05-28 ENCOUNTER — Emergency Department
Admit: 2021-05-28 | Discharge: 2021-05-28 | Disposition: A | Discharge status: Home / Self Care | Payer: MEDICARE | Attending: Family Medicine

## 2021-05-28 ENCOUNTER — Ambulatory Visit
Admit: 2021-05-28 | Discharge: 2021-05-28 | Disposition: A | Discharge status: Home / Self Care | Payer: MEDICARE | Attending: Family Medicine

## 2021-05-28 DIAGNOSIS — M542 Cervicalgia: Principal | ICD-10-CM

## 2021-05-28 MED ORDER — DICLOFENAC 1 % TOPICAL GEL
Freq: Four times a day (QID) | TOPICAL | 0 refills | 13 days | Status: CP
Start: 2021-05-28 — End: 2022-05-28

## 2021-05-30 DIAGNOSIS — N182 Chronic kidney disease, stage 2 (mild): Principal | ICD-10-CM

## 2021-05-30 DIAGNOSIS — I10 Essential (primary) hypertension: Principal | ICD-10-CM

## 2021-05-30 DIAGNOSIS — E1122 Type 2 diabetes mellitus with diabetic chronic kidney disease: Principal | ICD-10-CM

## 2021-06-14 ENCOUNTER — Ambulatory Visit: Admit: 2021-06-14 | Discharge: 2021-06-15 | Payer: MEDICARE

## 2021-06-17 ENCOUNTER — Ambulatory Visit
Admit: 2021-06-17 | Discharge: 2021-06-18 | Payer: MEDICARE | Attending: Orthopaedic Surgery | Primary: Orthopaedic Surgery

## 2021-06-27 MED ORDER — TROSPIUM 20 MG TABLET
ORAL_TABLET | Freq: Every evening | ORAL | 0 refills | 90 days | Status: CP
Start: 2021-06-27 — End: 2022-06-27

## 2021-07-01 DIAGNOSIS — E1122 Type 2 diabetes mellitus with diabetic chronic kidney disease: Principal | ICD-10-CM

## 2021-07-01 DIAGNOSIS — I25119 Atherosclerotic heart disease of native coronary artery with unspecified angina pectoris: Principal | ICD-10-CM

## 2021-07-01 DIAGNOSIS — I10 Essential (primary) hypertension: Principal | ICD-10-CM

## 2021-07-01 DIAGNOSIS — N182 Chronic kidney disease, stage 2 (mild): Principal | ICD-10-CM

## 2021-08-09 ENCOUNTER — Ambulatory Visit: Admit: 2021-08-09 | Discharge: 2021-08-10 | Payer: MEDICARE

## 2021-08-09 MED ORDER — HYDROCODONE 7.5 MG-ACETAMINOPHEN 325 MG TABLET
ORAL_TABLET | Freq: Four times a day (QID) | ORAL | 0 refills | 28.00000 days | Status: CP | PRN
Start: 2021-08-09 — End: ?

## 2021-08-09 MED ORDER — FLUTICASONE PROPIONATE 50 MCG/ACTUATION NASAL SPRAY,SUSPENSION
Freq: Every day | NASAL | 3 refills | 182 days | Status: CP
Start: 2021-08-09 — End: ?

## 2021-08-16 ENCOUNTER — Ambulatory Visit: Admit: 2021-08-16 | Discharge: 2021-08-17 | Payer: MEDICARE

## 2021-08-16 DIAGNOSIS — R972 Elevated prostate specific antigen [PSA]: Principal | ICD-10-CM

## 2021-08-16 DIAGNOSIS — Z125 Encounter for screening for malignant neoplasm of prostate: Principal | ICD-10-CM

## 2021-08-16 DIAGNOSIS — N39498 Other specified urinary incontinence: Principal | ICD-10-CM

## 2021-08-16 DIAGNOSIS — N400 Enlarged prostate without lower urinary tract symptoms: Principal | ICD-10-CM

## 2021-08-16 MED ORDER — TAMSULOSIN 0.4 MG CAPSULE
ORAL_CAPSULE | Freq: Every day | ORAL | 3 refills | 90 days | Status: CP
Start: 2021-08-16 — End: ?

## 2021-08-16 MED ORDER — TROSPIUM 20 MG TABLET
ORAL_TABLET | Freq: Every evening | ORAL | 3 refills | 90 days | Status: CP
Start: 2021-08-16 — End: 2022-08-16

## 2021-08-22 DIAGNOSIS — M549 Dorsalgia, unspecified: Principal | ICD-10-CM

## 2021-08-22 DIAGNOSIS — G8929 Other chronic pain: Principal | ICD-10-CM

## 2021-08-22 MED ORDER — HYDROCODONE 7.5 MG-ACETAMINOPHEN 325 MG TABLET
ORAL_TABLET | Freq: Four times a day (QID) | ORAL | 0 refills | 28.00000 days | Status: CP | PRN
Start: 2021-08-22 — End: ?

## 2021-08-23 DIAGNOSIS — N182 Chronic kidney disease, stage 2 (mild): Principal | ICD-10-CM

## 2021-08-23 DIAGNOSIS — E1122 Type 2 diabetes mellitus with diabetic chronic kidney disease: Principal | ICD-10-CM

## 2021-08-23 DIAGNOSIS — E1159 Type 2 diabetes mellitus with other circulatory complications: Principal | ICD-10-CM

## 2021-08-23 DIAGNOSIS — I152 Hypertension secondary to endocrine disorders: Principal | ICD-10-CM

## 2021-09-09 DIAGNOSIS — M549 Dorsalgia, unspecified: Principal | ICD-10-CM

## 2021-09-09 DIAGNOSIS — G8929 Other chronic pain: Principal | ICD-10-CM

## 2021-09-09 MED ORDER — DICLOFENAC 1 % TOPICAL GEL
0 refills | 0 days | Status: CP
Start: 2021-09-09 — End: ?

## 2021-09-30 DIAGNOSIS — E1159 Type 2 diabetes mellitus with other circulatory complications: Principal | ICD-10-CM

## 2021-09-30 DIAGNOSIS — I152 Hypertension secondary to endocrine disorders: Principal | ICD-10-CM

## 2021-09-30 DIAGNOSIS — E1122 Type 2 diabetes mellitus with diabetic chronic kidney disease: Principal | ICD-10-CM

## 2021-09-30 DIAGNOSIS — N182 Chronic kidney disease, stage 2 (mild): Principal | ICD-10-CM

## 2021-10-12 ENCOUNTER — Ambulatory Visit
Admit: 2021-10-12 | Discharge: 2021-10-13 | Payer: MEDICARE | Attending: Rehabilitative and Restorative Service Providers" | Primary: Rehabilitative and Restorative Service Providers"

## 2021-10-12 ENCOUNTER — Ambulatory Visit: Admit: 2021-10-12 | Discharge: 2021-10-13 | Payer: MEDICARE

## 2021-10-18 ENCOUNTER — Ambulatory Visit
Admit: 2021-10-18 | Discharge: 2021-10-19 | Payer: MEDICARE | Attending: Rehabilitative and Restorative Service Providers" | Primary: Rehabilitative and Restorative Service Providers"

## 2021-10-18 MED ORDER — PREDNISONE 10 MG TABLET
ORAL_TABLET | 0 refills | 0 days | Status: CP
Start: 2021-10-18 — End: ?

## 2021-10-30 DIAGNOSIS — G8929 Other chronic pain: Principal | ICD-10-CM

## 2021-10-30 DIAGNOSIS — M549 Dorsalgia, unspecified: Principal | ICD-10-CM

## 2021-10-31 MED ORDER — DICLOFENAC 1 % TOPICAL GEL
0 refills | 0 days | Status: CP
Start: 2021-10-31 — End: ?

## 2021-11-16 ENCOUNTER — Ambulatory Visit: Admit: 2021-11-16 | Discharge: 2021-11-17 | Payer: MEDICARE | Attending: Internal Medicine | Primary: Internal Medicine

## 2021-11-16 DIAGNOSIS — Z789 Other specified health status: Principal | ICD-10-CM

## 2021-11-16 DIAGNOSIS — E1169 Type 2 diabetes mellitus with other specified complication: Principal | ICD-10-CM

## 2021-11-16 DIAGNOSIS — E1159 Type 2 diabetes mellitus with other circulatory complications: Principal | ICD-10-CM

## 2021-11-16 DIAGNOSIS — I2510 Atherosclerotic heart disease of native coronary artery without angina pectoris: Principal | ICD-10-CM

## 2021-11-16 DIAGNOSIS — E1122 Type 2 diabetes mellitus with diabetic chronic kidney disease: Principal | ICD-10-CM

## 2021-11-16 DIAGNOSIS — E785 Hyperlipidemia, unspecified: Principal | ICD-10-CM

## 2021-11-16 DIAGNOSIS — I152 Hypertension secondary to endocrine disorders: Principal | ICD-10-CM

## 2021-11-16 DIAGNOSIS — N182 Chronic kidney disease, stage 2 (mild): Principal | ICD-10-CM

## 2021-11-18 ENCOUNTER — Other Ambulatory Visit: Admit: 2021-11-18 | Discharge: 2021-11-19 | Payer: MEDICARE

## 2021-11-30 DIAGNOSIS — G8929 Other chronic pain: Principal | ICD-10-CM

## 2021-11-30 DIAGNOSIS — M549 Dorsalgia, unspecified: Principal | ICD-10-CM

## 2021-12-01 MED ORDER — GABAPENTIN 300 MG CAPSULE
ORAL_CAPSULE | 1 refills | 0 days | Status: CP
Start: 2021-12-01 — End: ?

## 2021-12-01 MED ORDER — DICLOFENAC 1 % TOPICAL GEL
0 refills | 0 days | Status: CP
Start: 2021-12-01 — End: ?

## 2021-12-13 DIAGNOSIS — E1122 Type 2 diabetes mellitus with diabetic chronic kidney disease: Principal | ICD-10-CM

## 2021-12-13 DIAGNOSIS — E1159 Type 2 diabetes mellitus with other circulatory complications: Principal | ICD-10-CM

## 2021-12-13 DIAGNOSIS — N182 Chronic kidney disease, stage 2 (mild): Principal | ICD-10-CM

## 2021-12-13 DIAGNOSIS — I152 Hypertension secondary to endocrine disorders: Principal | ICD-10-CM

## 2021-12-22 ENCOUNTER — Ambulatory Visit: Admit: 2021-12-22 | Discharge: 2021-12-23 | Payer: MEDICARE

## 2021-12-22 MED ORDER — HYDROCODONE 7.5 MG-ACETAMINOPHEN 325 MG TABLET
ORAL_TABLET | Freq: Four times a day (QID) | ORAL | 0 refills | 28 days | Status: CP | PRN
Start: 2021-12-22 — End: ?

## 2022-01-03 ENCOUNTER — Ambulatory Visit: Admit: 2022-01-03 | Discharge: 2022-01-04 | Payer: MEDICARE

## 2022-01-03 DIAGNOSIS — H524 Presbyopia: Principal | ICD-10-CM

## 2022-01-03 DIAGNOSIS — H52203 Unspecified astigmatism, bilateral: Principal | ICD-10-CM

## 2022-01-03 DIAGNOSIS — H02831 Dermatochalasis of right upper eyelid: Principal | ICD-10-CM

## 2022-01-03 DIAGNOSIS — E119 Type 2 diabetes mellitus without complications: Principal | ICD-10-CM

## 2022-01-03 DIAGNOSIS — H2513 Age-related nuclear cataract, bilateral: Principal | ICD-10-CM

## 2022-01-03 DIAGNOSIS — H02834 Dermatochalasis of left upper eyelid: Principal | ICD-10-CM

## 2022-01-17 DIAGNOSIS — N182 Chronic kidney disease, stage 2 (mild): Principal | ICD-10-CM

## 2022-01-17 DIAGNOSIS — I152 Hypertension secondary to endocrine disorders: Principal | ICD-10-CM

## 2022-01-17 DIAGNOSIS — E1159 Type 2 diabetes mellitus with other circulatory complications: Principal | ICD-10-CM

## 2022-01-17 DIAGNOSIS — E1122 Type 2 diabetes mellitus with diabetic chronic kidney disease: Principal | ICD-10-CM

## 2022-01-24 DIAGNOSIS — G8929 Other chronic pain: Principal | ICD-10-CM

## 2022-01-24 DIAGNOSIS — N182 Chronic kidney disease, stage 2 (mild): Principal | ICD-10-CM

## 2022-01-24 DIAGNOSIS — E1122 Type 2 diabetes mellitus with diabetic chronic kidney disease: Principal | ICD-10-CM

## 2022-01-24 DIAGNOSIS — M549 Dorsalgia, unspecified: Principal | ICD-10-CM

## 2022-01-31 ENCOUNTER — Ambulatory Visit: Admit: 2022-01-31 | Discharge: 2022-02-01 | Payer: MEDICARE | Attending: Internal Medicine | Primary: Internal Medicine

## 2022-01-31 DIAGNOSIS — N182 Chronic kidney disease, stage 2 (mild): Principal | ICD-10-CM

## 2022-01-31 DIAGNOSIS — I200 Unstable angina: Principal | ICD-10-CM

## 2022-01-31 DIAGNOSIS — Z789 Other specified health status: Principal | ICD-10-CM

## 2022-01-31 DIAGNOSIS — E1159 Type 2 diabetes mellitus with other circulatory complications: Principal | ICD-10-CM

## 2022-01-31 DIAGNOSIS — E785 Hyperlipidemia, unspecified: Principal | ICD-10-CM

## 2022-01-31 DIAGNOSIS — E1169 Type 2 diabetes mellitus with other specified complication: Principal | ICD-10-CM

## 2022-01-31 DIAGNOSIS — I152 Hypertension secondary to endocrine disorders: Principal | ICD-10-CM

## 2022-01-31 DIAGNOSIS — Z8673 Personal history of transient ischemic attack (TIA), and cerebral infarction without residual deficits: Principal | ICD-10-CM

## 2022-01-31 DIAGNOSIS — I2510 Atherosclerotic heart disease of native coronary artery without angina pectoris: Principal | ICD-10-CM

## 2022-01-31 DIAGNOSIS — E1122 Type 2 diabetes mellitus with diabetic chronic kidney disease: Principal | ICD-10-CM

## 2022-01-31 MED ORDER — PRAVASTATIN 10 MG TABLET
ORAL_TABLET | Freq: Every day | ORAL | 11 refills | 30 days | Status: CP
Start: 2022-01-31 — End: 2023-01-31

## 2022-02-01 ENCOUNTER — Ambulatory Visit: Admit: 2022-02-01 | Discharge: 2022-02-01 | Payer: MEDICARE

## 2022-02-01 DIAGNOSIS — I200 Unstable angina: Principal | ICD-10-CM

## 2022-02-01 DIAGNOSIS — Z01818 Encounter for other preprocedural examination: Principal | ICD-10-CM

## 2022-02-01 DIAGNOSIS — R0602 Shortness of breath: Principal | ICD-10-CM

## 2022-02-02 ENCOUNTER — Ambulatory Visit: Admit: 2022-02-02 | Discharge: 2022-02-03 | Payer: MEDICARE

## 2022-02-03 DIAGNOSIS — I350 Nonrheumatic aortic (valve) stenosis: Principal | ICD-10-CM

## 2022-02-14 DIAGNOSIS — E1159 Type 2 diabetes mellitus with other circulatory complications: Principal | ICD-10-CM

## 2022-02-14 DIAGNOSIS — I152 Hypertension secondary to endocrine disorders: Principal | ICD-10-CM

## 2022-02-14 DIAGNOSIS — E1122 Type 2 diabetes mellitus with diabetic chronic kidney disease: Principal | ICD-10-CM

## 2022-02-14 DIAGNOSIS — N182 Chronic kidney disease, stage 2 (mild): Principal | ICD-10-CM

## 2022-02-21 ENCOUNTER — Ambulatory Visit
Admit: 2022-02-21 | Discharge: 2022-02-21 | Disposition: A | Discharge status: Home / Self Care | Payer: MEDICARE | Attending: Student in an Organized Health Care Education/Training Program

## 2022-02-21 ENCOUNTER — Emergency Department
Admit: 2022-02-21 | Discharge: 2022-02-21 | Disposition: A | Discharge status: Home / Self Care | Payer: MEDICARE | Attending: Student in an Organized Health Care Education/Training Program

## 2022-02-21 DIAGNOSIS — R079 Chest pain, unspecified: Principal | ICD-10-CM

## 2022-02-21 MED ORDER — ISOSORBIDE MONONITRATE ER 30 MG TABLET,EXTENDED RELEASE 24 HR
ORAL_TABLET | Freq: Every day | ORAL | 0 refills | 30 days | Status: CP
Start: 2022-02-21 — End: 2022-03-23

## 2022-02-26 DIAGNOSIS — G8929 Other chronic pain: Principal | ICD-10-CM

## 2022-02-26 DIAGNOSIS — M549 Dorsalgia, unspecified: Principal | ICD-10-CM

## 2022-02-27 MED ORDER — DICLOFENAC 1 % TOPICAL GEL
0 refills | 0 days | Status: CP
Start: 2022-02-27 — End: ?

## 2022-03-01 ENCOUNTER — Ambulatory Visit: Admit: 2022-03-01 | Discharge: 2022-03-02 | Payer: MEDICARE

## 2022-03-01 ENCOUNTER — Encounter: Admit: 2022-03-01 | Discharge: 2022-03-02 | Payer: MEDICARE

## 2022-03-01 ENCOUNTER — Ambulatory Visit
Admit: 2022-03-01 | Discharge: 2022-03-02 | Payer: MEDICARE | Attending: Thoracic Surgery (Cardiothoracic Vascular Surgery) | Primary: Thoracic Surgery (Cardiothoracic Vascular Surgery)

## 2022-03-01 DIAGNOSIS — I350 Nonrheumatic aortic (valve) stenosis: Principal | ICD-10-CM

## 2022-03-01 DIAGNOSIS — R791 Abnormal coagulation profile: Principal | ICD-10-CM

## 2022-03-01 DIAGNOSIS — I25119 Atherosclerotic heart disease of native coronary artery with unspecified angina pectoris: Principal | ICD-10-CM

## 2022-03-01 MED ORDER — MUPIROCIN 2 % TOPICAL OINTMENT
0 refills | 0 days | Status: CP
Start: 2022-03-01 — End: ?

## 2022-03-01 MED ORDER — CHLORHEXIDINE GLUCONATE 0.12 % MOUTHWASH
Freq: Two times a day (BID) | OROMUCOSAL | 0 refills | 5 days | Status: CP
Start: 2022-03-01 — End: 2022-03-06

## 2022-03-01 NOTE — Unmapped (Signed)
Cardiac Surgery Consult Note    Requesting Attending Physician :  Linna Caprice ACNP-BC  Consulting Physician: Dr. [name]  Reason for Consult:  Coronary Artery Disease    History of Present Illness:     Christopher [name] is a 71 y.o. male with PMHX of Accelerating angina, native coronary artery disease (LAD and OM), HLD, T2DM (HgbA1C 7.9), HTN, Statin intolerance, TIA who presents for evaluation for Coronary Artery Bypass Grafting. Christopher [name] reports that he has CP/Angina every day, even at rest. However, this is worsened by exertion. He denies other associated symptoms.    Allergies:  Morphine, Atorvastatin, Baclofen, Ibuprofen, Pneumovax, Zetia    Scheduled Meds:  Current Outpatient Medications   Medication Sig Dispense Refill    amLODIPine (NORVASC) 10 MG tablet Take 1 tablet (10 mg total) by mouth daily. For high blood pressure 90 tablet 3    aspirin (ECOTRIN) 81 MG tablet Take 1 tablet (81 mg total) by mouth daily. 30 tablet 11    blood sugar diagnostic (ONETOUCH ULTRA TEST) Strp USE ONCE DAILY TO CHECK FASTING BLOOD GLUCOSE 100 strip 5    blood-glucose meter Misc Inject 1 Device into the skin daily. DX: E11.8 1 each 0    clopidogreL (PLAVIX) 75 mg tablet Take 1 tablet (75 mg total) by mouth daily. 90 tablet 3    diclofenac sodium (VOLTAREN) 1 % gel Apply 2 g topically four (4) times a day. 100 g 0    diclofenac sodium (VOLTAREN) 1 % gel APPLY 2 GRAMS TO AFFECTED  AREA(S) TOPICALLY 4 TIMES DAILY 400 g 0    fluticasone propionate (FLONASE) 50 mcg/actuation nasal spray 2 sprays into each nostril daily. 48 mL 3    gabapentin (NEURONTIN) 300 MG capsule TAKE 1 CAPSULE BY MOUTH TWICE  DAILY 200 capsule 1    isosorbide mononitrate (IMDUR) 30 MG 24 hr tablet Take 1 tablet (30 mg total) by mouth daily. 30 tablet 0    metFORMIN (GLUCOPHAGE) 1000 MG tablet Take 1 tablet (1,000 mg total) by mouth in the morning and 1 tablet (1,000 mg total) in the evening. Take with meals. 180 tablet 3 metoprolol succinate (TOPROL-XL) 100 MG 24 hr tablet Take 1 tablet (100 mg total) by mouth daily. 90 tablet 3    nitroglycerin (NITROSTAT) 0.4 MG SL tablet Place 1 tablet (0.4 mg total) under the tongue every five (5) minutes as needed for chest pain. 20 tablet 3    omeprazole (PRILOSEC) 20 MG capsule Take 1 capsule (20 mg total) by mouth daily. 90 capsule 3    pravastatin (PRAVACHOL) 10 MG tablet Take 1 tablet (10 mg total) by mouth daily. 30 tablet 11    sertraline (ZOLOFT) 100 MG tablet Take 1 tablet (100 mg total) by mouth daily. 90 tablet 3    tamsulosin (FLOMAX) 0.4 mg capsule Take 1 capsule (0.4 mg total) by mouth daily. 90 capsule 3    traZODone (DESYREL) 150 MG tablet Take 1 tablet (150 mg total) by mouth nightly. 90 tablet 3    trospium (SANCTURA) 20 mg tablet Take 1 tablet (20 mg total) by mouth nightly. 90 tablet 3    HYDROcodone-acetaminophen (NORCO) 7.5-325 mg per tablet Take 1 tablet by mouth every six (6) hours as needed for pain. Fill date: 08/22/2021 (Patient not taking: Reported on 03/01/2022) 112 tablet 0    HYDROcodone-acetaminophen (NORCO) 7.5-325 mg per tablet Take 1 tablet by mouth every six (6) hours as needed for pain. Fill date: 09/19/2021 112 tablet  0    HYDROcodone-acetaminophen (NORCO) 7.5-325 mg per tablet Take 1 tablet by mouth every six (6) hours as needed for pain. Fill date: 12/22/2021 112 tablet 0     No current facility-administered medications for this visit.     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 01/16/2019    Depression 2016    Diabetes mellitus (CMS-HCC)     GERD (gastroesophageal reflux disease) 1982    Hypertension Meningitis 12/25/2015    Mixed hyperlipidemia     Myocardial infarction (CMS-HCC) 1986    Obesity 1986    first back surgery    Peptic ulceration      Surgical History:  Past Surgical History:   Procedure Laterality Date    BACK SURGERY      CARDIAC CATHETERIZATION      STENTS PLACEMENT     CARDIAC SURGERY  2005    stents    CORONARY STENT PLACEMENT      FOREIGN BODY REMOVAL Right about 20 years ago    HERNIA REPAIR      RIGHT INGUNIAL    JOINT REPLACEMENT Left     TOTAL SHOULDER    PR ARM/ELBOW TENDON LENGTHEN,SINGLE,EA Left 02/17/2019    Procedure: TENDON LENGTHENING UPPER ARM/ELBOW SNGL EA;  Surgeon: Daisy Lazar, MD;  [facility];  Service: Orthopedics    PR CATH PLACE/CORON ANGIO, IMG SUPER/INTERP,W LEFT HEART VENTRICULOGRAPHY N/A 02/01/2022    Procedure: Left Heart Catheterization;  Surgeon: Jacquelyne Balint, MD;  Location: [facility] CATH;  Service: Cardiology    PR OSTEOTOMY 1ST METATARSAL,BASE/SHAFT Left 07/20/2017    Procedure: OSTEOTOMY, W/WO LENGTHENING, SHORTENING OR ANGULAR CORRECTION, METATARSAL; FIRST METATARSAL;  Surgeon: Valda Favia, MD;  Location: [facility] OR [facility];  Service: Ortho Foot & Ankle    PR OSTEOTOMY HEEL BONE Left 07/20/2017    Procedure: OSTEOTOMYLoistine Chance W/WO INT FIXA;  Surgeon: Valda Favia, MD;  Location: [facility] OR [facility];  Service: Ortho Foot & Ankle    PR OSTEOTOMY METATARSAL (NOT 1ST) Left 06/06/2018    Procedure: OSTEOTOMY, W/WO LENGTHENING, SHORTENING OR ANGULAR CORRECTION, METATARSAL; OTHER THAN FIRST METATARSAL, EA;  Surgeon: Valda Favia, MD;  Location: [facility] OR [facility];  Service: Orthopedics    PR PART REMV PHALANX OF TOE Left 06/06/2018    Procedure: PART EXC BONE PHALANX TOE;  Surgeon: Valda Favia, MD;  Location: [facility];  Service: Orthopedics    PR RECONSTR TOTAL SHOULDER IMPLANT Left 11/12/2018    Procedure: R28   ARTHROPLASTY, GLENOHUMERAL JOINT; TOTAL SHOULDER(GLENOID & PROXIMAL HUMERAL REPLACEMENT(EG, TOTAL SHOULDER);  Surgeon: Nickola Major, MD;  [facility] OR [facility];  Service: Orthopedics    PR REMOVAL DEEP IMPLANT Left 11/12/2018    Procedure: REMOVE IMPLANT; DEEP SHOULDER;  Surgeon: Nickola Major, MD;  [facility];  Service: Orthopedics    PR REPAIR BICEPS LONG TENDON Left 11/12/2018    Procedure: TENODESIS LONG TENDON BICEPS;  Surgeon: Nickola Major,  MD;  [facility];  Service: Orthopedics    PR REPAIR PERONEAL TENDONS,FIB OSTEOTMY Left 07/20/2017    Procedure: REPR DISLOC PERONEAL TENDONS; W/FIB OSTEOTOMY;  Surgeon: Valda Favia, MD;  Location: [facility];  Service: Ortho Foot & Ankle    PR REVISE MEDIAN N/CARPAL TUNNEL SURG Left 02/17/2019    Procedure: R22 NEUROPLASTY AND/OR TRANSPOSITION; MEDIAN NERVE AT CARPAL TUNNEL;  Surgeon: Theodora Blow Jacqlyn Krauss, MD;  Location: [facility];  Service: Orthopedics    PR REVISE ULNAR NERVE AT ELBOW Left 02/17/2019    Procedure: NEUROPLASTY AND/OR TRANSPOSITION; ULNAR NERVE AT ELBOW;  Surgeon: Daisy Lazar, MD;  [facility]: [facility];  Service: Orthopedics    SHOULDER SURGERY Bilateral     L TSA, R ROTATOR CUFF      SKIN BIOPSY      SPINE SURGERY  1986/1995/2000/2002    LUMBAR FUSION    stent       Social History:  Social History     Tobacco Use   Smoking Status Never   Smokeless Tobacco Never   Tobacco Comments    never will     Social History     Substance and Sexual Activity   Alcohol Use No    Alcohol/week: 0.0 standard drinks of alcohol     Social History     Substance and Sexual Activity   Drug Use Not Currently     Living situation: the patient lives with their spouse.    Family History:  The patient's family history includes Alcohol abuse in his father; COPD in his brother, brother, and sister; Cancer in his father and maternal grandmother; Cataracts in his father, mother, and paternal grandfather; Diabetes in his brother, father, mother, sister, and sister; Early death in his brother; Hypertension in his father, maternal grandfather, maternal grandmother, mother, paternal grandfather, and paternal grandmother; Kidney disease in his maternal aunt; Melanoma in his father; No Known Problems in his maternal uncle, paternal aunt, paternal uncle, and another family member; Stroke in his mother..    Immediate family history of CAD < 83 years of age: no    Review of Systems: A 12 system review of systems was negative except as noted in HPI.    Physical Exam:  Vitals:    03/01/22 1110   BP: 161/87   Pulse: 60   Temp: 37.1 ??C (98.7 ??F)   SpO2: 98%     General: alert and oriented, resting comfortably in NAD  HEENT: normocephalic, atraumatic. sclera anicteric, MMM, Dentition is Good    Neck: Supple.  Pulmonary: non-labored breathing, lungs CTAB, No wheezes, rales or rhonchi.   CV: regular rate and rhythm. Normal S1, S2. 2+ DP and radial pulses bilaterally.   Abdomen/GI: soft, non-tender, non-distended. positive bowel sounds throughout abdomen. no rebound or guarding present.  Neurologic: A&O x 3, answering questions appropriately. strength equal in upper & lower extremities bilaterally. sensation intact throughout. no facial droop noted.  Musculoskeletal: extremities warm and well perfused.   Skin: warm and dry. No rashes.    Diagnostic Studies:  Carotid duplex 03/01/2022:  Final Interpretation  Right Carotid: There was no evidence of thrombus, dissection, atherosclerotic                 plaque or stenosis in the cervical carotid system.  Left Carotid: There was no evidence of thrombus, dissection, atherosclerotic                plaque  or stenosis in the cervical carotid system.  Vertebrals:  Both vertebral arteries were patent with antegrade flow.  Subclavians: Normal flow hemodynamics were seen in bilateral subclavian               arteries.    TTE 02/02/2022:    1. Limited study to assess ventricular function.    2. The left ventricle is normal in size with normal wall thickness. 3. The left ventricular systolic function is borderline, LVEF is visually  estimated at 50-55%.    4. The right ventricle is normal in size, with normal systolic function.    LHC 02/01/2022:  DIAGNOSTIC FINDINGS:  Coronary Angiography:  - Dominance: RCA    Coronary Findings:  Left Main:  - Ostial Left Main: No significant stenosis (moderate to large caliber)  - Left Main: No significant stenosis    Left Anterior Descending:  - LAD: Moderate to large vessel with 90% ostial stenosis, patent proximal LAD stent, and moderate diffuse disease distal to stent through to distal LAD  - First diagonal: (very small caliber)  - Second diagonal: mild irregularity (small to moderate caliber)    Left Circumflex:  - LCX: (small to moderate caliber)  - Ostial LCx: No significant stenosis  - Proximal LCx: moderate irregularity  - Mid LCx: . Moderate irregularity up to 50% just distal to bifurcation of OM1  - Distal LCx: No significant stenosis  - OM1: . Small to moderate sized vessel with mild diffuse disease throughout. More prominent proximal haziness with up to 50% stenosis  - OM2: mild irregularity (branching)    Right coronary artery:  - RCA: . Small to moderate vessel with mild diffuse disease throughout    Assessment/Plan:   Christopher [name] is a very pleasant 71 y.o. male with past medical history of Accelerating angina, native coronary artery disease (LAD and OM), HLD, T2DM (HgbA1C 7.9), HTN, Statin intolerance, TIA who presents for evaluation for Coronary Artery Bypass Grafting. that is being seen in evaluation for. Based on current symptoms and diagnostic findings, surgical revascularization is recommended.  Potential risks and benefits of surgery were discussed with Christopher [name] and surgical consent obtained by Dr. Sindy Guadeloupe.  The surgery is being schedule for CABG x 2 v on 03/20/2022.  Prescriptions for Mupirocin (Bactroban) nasal ointment and Chlorhexadine mouth rinse were provided for the patient.  The patient was asked to discontinue aspirin  1  prior to surgery and hold Plavix and Diclofenic cream 5 days before.  The patient will present to the [facility] clinic for routine pre-operative testing.  The patient will call with any questions or concerns.  Christopher [name] will represent to the hospital the morning of surgery.  The patient was on Beta Blocker routinely.      Dr. Sindy Guadeloupe was present for the clinic visit and endorses the plan of care.    LOS index:  Accelerating angina requiring intervention.    Be[place] Score: Age >/= 71 (2), HTN (1), Myocardial Infarction (1). Total: 7       Discharge Clinical Pathway Screening Questionnaire     1) What is the address you will go to when you are discharged from the hospital? [address] [place], [place]    2) Are there steps to: enter the residence? No (ramp) ; inside the residence? No    3) Will you have access to a first floor bedroom? Yes; bathroom? Yes    4) Do you have a caregiver  than can provide physical assistance to you when you are discharged from the hospital? Yes  If so, How much? Continual; How often? PRN; What is their name and phone number? Alton Revere, [phone]    5) Are you able to move on your own within your household? Yes    6) Are you able to move on your own throughout your community? Yes    7) Do you use a device to move around your home or the community currently? No (i.e. Wheelchair, cane, etc.)  If yes, what? N/A    8) What equipment do you have access to at your home (i.e. Dan Humphreys, cane, wheelchair, hospital bed, power lift recliner, commode, grab bars, etc.) ? N/A    9) Do you need assistance to perform your self-care (i.e. Bathing, feeding, dressing, putting on socks, getting on or off the toilet, etc)? No     10) Have you had a fall in the past 6 months? No

## 2022-03-02 ENCOUNTER — Ambulatory Visit: Admit: 2022-03-02 | Discharge: 2022-03-03 | Payer: MEDICARE | Attending: Internal Medicine | Primary: Internal Medicine

## 2022-03-02 DIAGNOSIS — E1122 Type 2 diabetes mellitus with diabetic chronic kidney disease: Principal | ICD-10-CM

## 2022-03-02 DIAGNOSIS — E1159 Type 2 diabetes mellitus with other circulatory complications: Principal | ICD-10-CM

## 2022-03-02 DIAGNOSIS — I240 Acute coronary thrombosis not resulting in myocardial infarction: Principal | ICD-10-CM

## 2022-03-02 DIAGNOSIS — E1169 Type 2 diabetes mellitus with other specified complication: Principal | ICD-10-CM

## 2022-03-02 DIAGNOSIS — E785 Hyperlipidemia, unspecified: Principal | ICD-10-CM

## 2022-03-02 DIAGNOSIS — I259 Chronic ischemic heart disease, unspecified: Principal | ICD-10-CM

## 2022-03-02 DIAGNOSIS — I152 Hypertension secondary to endocrine disorders: Principal | ICD-10-CM

## 2022-03-02 DIAGNOSIS — Z789 Other specified health status: Principal | ICD-10-CM

## 2022-03-02 DIAGNOSIS — I209 Angina pectoris, unspecified: Principal | ICD-10-CM

## 2022-03-02 DIAGNOSIS — N182 Chronic kidney disease, stage 2 (mild): Principal | ICD-10-CM

## 2022-03-02 MED ORDER — ALIROCUMAB 75 MG/ML SUBCUTANEOUS PEN INJECTOR
SUBCUTANEOUS | 11 refills | 0 days | Status: CP
Start: 2022-03-02 — End: 2023-03-02
  Filled 2022-03-13: qty 2, 28d supply, fill #0

## 2022-03-02 MED ORDER — ISOSORBIDE MONONITRATE ER 30 MG TABLET,EXTENDED RELEASE 24 HR
ORAL_TABLET | Freq: Two times a day (BID) | ORAL | 3 refills | 90 days | Status: CP
Start: 2022-03-02 — End: 2023-03-02

## 2022-03-03 DIAGNOSIS — I25119 Atherosclerotic heart disease of native coronary artery with unspecified angina pectoris: Principal | ICD-10-CM

## 2022-03-03 DIAGNOSIS — E782 Mixed hyperlipidemia: Principal | ICD-10-CM

## 2022-03-08 NOTE — Unmapped (Signed)
[facility] Specialty Medication Onboarding    Specialty Medication: Praluent pen 75mg /mL injection  Prior Authorization: Approved   Financial Assistance: Yes - grant approved as secondary   Final Copay/Day Supply: $0 / 28 days    Insurance Restrictions: None     Notes to Pharmacist: N/A    The triage team has completed the benefits investigation and has determined that the patient is able to fill this medication at [facility]. Please contact the patient to complete the onboarding or follow up with the prescribing physician as needed.

## 2022-03-10 MED ORDER — EMPTY CONTAINER
2 refills | 0 days
Start: 2022-03-10 — End: ?

## 2022-03-10 NOTE — Unmapped (Signed)
[facility]   Specialty Lite Counseling    Mr.Sebatian Grambo is a 71 y.o. male with hyperlipidemia who I am counseling today on initiation of therapy.  I am speaking to the patient.    Was a Nurse, learning disability used for this call? No    Verified patient's date of birth / HIPAA.    Specialty Lite medication(s) to be sent: General Specialty: Praluent      Non-specialty medications/supplies to be sent: Higher education careers adviser      Medications not needed at this time: n/a         An offer to provide counseling to the patient regarding their medication was made. The patient declined counseling       Current Medications (including OTC/herbals), Comorbidities and Allergies     Current Outpatient Medications   Medication Sig Dispense Refill    alirocumab 75 mg/mL PnIj Inject 75 mg under the skin every fourteen (14) days. 2 mL 11    amLODIPine (NORVASC) 10 MG tablet Take 1 tablet (10 mg total) by mouth daily. For high blood pressure 90 tablet 3    aspirin (ECOTRIN) 81 MG tablet Take 1 tablet (81 mg total) by mouth daily. 30 tablet 11    blood sugar diagnostic (ONETOUCH ULTRA TEST) Strp USE ONCE DAILY TO CHECK FASTING BLOOD GLUCOSE 100 strip 5    blood-glucose meter Misc Inject 1 Device into the skin daily. DX: E11.8 1 each 0    clopidogreL (PLAVIX) 75 mg tablet Take 1 tablet (75 mg total) by mouth daily. 90 tablet 3    diclofenac sodium (VOLTAREN) 1 % gel Apply 2 g topically four (4) times a day. 100 g 0    diclofenac sodium (VOLTAREN) 1 % gel APPLY 2 GRAMS TO AFFECTED  AREA(S) TOPICALLY 4 TIMES DAILY 400 g 0    fluticasone propionate (FLONASE) 50 mcg/actuation nasal spray 2 sprays into each nostril daily. 48 mL 3    gabapentin (NEURONTIN) 300 MG capsule TAKE 1 CAPSULE BY MOUTH TWICE  DAILY 200 capsule 1    HYDROcodone-acetaminophen (NORCO) 7.5-325 mg per tablet Take 1 tablet by mouth every six (6) hours as needed for pain. Fill date: 08/22/2021 (Patient not taking: Reported on 03/02/2022) 112 tablet 0 HYDROcodone-acetaminophen (NORCO) 7.5-325 mg per tablet Take 1 tablet by mouth every six (6) hours as needed for pain. Fill date: 09/19/2021 (Patient not taking: Reported on 03/02/2022) 112 tablet 0    HYDROcodone-acetaminophen (NORCO) 7.5-325 mg per tablet Take 1 tablet by mouth every six (6) hours as needed for pain. Fill date: 12/22/2021 (Patient not taking: Reported on 03/02/2022) 112 tablet 0    isosorbide mononitrate (IMDUR) 30 MG 24 hr tablet Take 1 tablet (30 mg total) by mouth two (2) times a day. 180 tablet 3    metFORMIN (GLUCOPHAGE) 1000 MG tablet Take 1 tablet (1,000 mg total) by mouth in the morning and 1 tablet (1,000 mg total) in the evening. Take with meals. 180 tablet 3    metoprolol succinate (TOPROL-XL) 100 MG 24 hr tablet Take 1 tablet (100 mg total) by mouth daily. 90 tablet 3    mupirocin (BACTROBAN) 2 % ointment Apply to both nostrils with a Q-tip the night before and morning of surgery. 1 g 0    nitroglycerin (NITROSTAT) 0.4 MG SL tablet Place 1 tablet (0.4 mg total) under the tongue every five (5) minutes as needed for chest pain. 20 tablet 3    omeprazole (PRILOSEC) 20 MG capsule Take 1  capsule (20 mg total) by mouth daily. 90 capsule 3    pravastatin (PRAVACHOL) 10 MG tablet Take 1 tablet (10 mg total) by mouth daily. 30 tablet 11    sertraline (ZOLOFT) 100 MG tablet Take 1 tablet (100 mg total) by mouth daily. 90 tablet 3    tamsulosin (FLOMAX) 0.4 mg capsule Take 1 capsule (0.4 mg total) by mouth daily. (Patient taking differently: Take 1 capsule (0.4 mg total) by mouth nightly.) 90 capsule 3    traZODone (DESYREL) 150 MG tablet Take 1 tablet (150 mg total) by mouth nightly. 90 tablet 3    trospium (SANCTURA) 20 mg tablet Take 1 tablet (20 mg total) by mouth nightly. (Patient taking differently: Take 1 tablet (20 mg total) by mouth in the morning.) 90 tablet 3     Current Facility-Administered Medications   Medication Dose Route Frequency Provider Last Rate Last Admin    acetaminophen (TYLENOL) tablet 1,000 mg  1,000 mg Oral Once Blandon-Hendrix, Sunnie Nielsen, ACNP        naloxegoL (MOVANTIK) tablet 12.5 mg  12.5 mg Oral Once Blandon-Hendrix, Sunnie Nielsen, ACNP           Allergies   Allergen Reactions    Morphine Hives    Atorvastatin      neuropathy    Atorvastatin Calcium      neuropathy    Baclofen      headache  headache    Ibuprofen      GI bleed    Pneumococcal 23-Valent Polysaccharide Vaccine Other (See Comments)     Redness and pain at injection site. 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)    Hypertension associated with type 2 diabetes mellitus (CMS-HCC)    Mixed hyperlipidemia    Moderate episode of recurrent major depressive disorder (CMS-HCC)    Primary insomnia    Back pain    Type 2 diabetes mellitus with stage 2 chronic kidney disease, without long-term current use of insulin (CMS-HCC)    Gastroesophageal reflux disease    Bacteremia    Absence of bladder continence    Herpes labialis    History of repair of inguinal hernia    History of repair of rotator cuff    History of spinal surgery    Recurrent coronary arteriosclerosis after percutaneous transluminal coronary angioplasty    Controlled substance agreement signed    Os peroneum syndrome of right foot    Cavovarus deformity of foot, acquired, left    Vitamin D deficiency    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    Appropriateness of Therapy     Prescription has been clinically reviewed: Yes    Financial Information     Medication Assistance provided: Prior Authorization and [employer]    Anticipated copay of $0 (28 days) reviewed with patient.     Patient Specific Needs     Does the patient have any physical, cognitive, or cultural barriers? No    Does the patient have adequate living arrangements? (i.e. the ability to store and take their medication appropriately) Yes    Did you identify any home environmental safety or security hazards? No    Patient prefers to have medications discussed with  Patient     Is the patient or caregiver able to read and understand education materials at a high school level or above? Yes    Patient's primary language is  English     Is the patient high risk? No    SOCIAL DETERMINANTS OF HEALTH     At the [facility], we have learned that life circumstances - like trouble affording food, housing, utilities, or transportation can affect the health of many of our patients.   That is why we wanted to ask: are you currently experiencing any life circumstances that are negatively impacting your health and/or quality of life? Patient declined to answer    Social Determinants of Health     Financial Resource Strain: Low Risk  (12/13/2021)    Overall Financial Resource Strain (CARDIA)     Difficulty of Paying Living Expenses: Not hard at all   Internet Connectivity: Not on file   Food Insecurity: No Food Insecurity (12/13/2021)    Hunger Vital Sign     Worried About Running Out of Food in the Last Year: Never true     Ran Out of Food in the Last Year: Never true   Tobacco Use: Low Risk  (03/02/2022)    Patient History     Smoking Tobacco Use: Never     Smokeless Tobacco Use: Never     Passive Exposure: Not on file   Housing/Utilities: Low Risk  (12/13/2021)    Housing/Utilities     Within the past 12 months, have you ever stayed: outside, in a car, in a tent, in an overnight shelter, or temporarily in someone else's home (i.e. couch-surfing)?: No     Are you worried about losing your housing?: No     Within the past 12 months, have you been unable to get utilities (heat, electricity) when it was really needed?: No   Alcohol Use: Not on file   Transportation Needs: No Transportation Needs (12/13/2021)    PRAPARE - Therapist, art (Medical): No     Lack of Transportation (Non-Medical): No   Substance Use: Not on file   Health Literacy: Not on file   Physical Activity: Not on file   Interpersonal Safety: Not on file   Stress: Not on file   Intimate Partner Violence: Not on file   Depression: Not on file   Social Connections: Not on file       Would you be willing to receive help with any of the needs that you have identified today? Not applicable    Delivery Information     Verified delivery address.    Scheduled delivery date: 03/14/22    Expected start date: 03/14/22 (Patient will go to provider office for injection teaching and first dose)  Medication will be delivered via UPS to the prescription address in [facility].  This shipment will not require a signature.      Explained the services we provide at [facility] and that each month we will send text messages and/or mychart messages to set up refills. Informed patient that refills should be scheduled 7-10 days prior to when they will run out of medication. Informed patient that a welcome packet, containing information about our pharmacy and other support services, a Notice of Privacy Practices, and a drug information handout will be sent.      The patient or caregiver noted above participated in the development of this care plan and knows that they can request review of or adjustments to the care plan at any time. Patient or caregiver verbalized understanding of the above information as well as how to contact the pharmacy at [phone] option 4 with any questions/concerns.  The pharmacy is open Monday through Friday 8:30am-4:30pm.  A pharmacist is available 24/7 via pager to answer any clinical questions they may have.      Camillo Flaming, PharmD  [facility] Specialty Pharmacist

## 2022-03-13 MED FILL — EMPTY CONTAINER: 120 days supply | Qty: 1 | Fill #0

## 2022-03-20 ENCOUNTER — Encounter
Admit: 2022-03-20 | Discharge: 2022-03-27 | Disposition: A | Discharge status: Home / Self Care | Payer: MEDICARE | Attending: Surgical | Admitting: Thoracic Surgery (Cardiothoracic Vascular Surgery)

## 2022-03-20 ENCOUNTER — Ambulatory Visit: Admit: 2022-03-20 | Payer: MEDICARE

## 2022-03-20 ENCOUNTER — Ambulatory Visit
Admit: 2022-03-20 | Discharge: 2022-03-27 | Disposition: A | Discharge status: Home / Self Care | Payer: MEDICARE | Admitting: Thoracic Surgery (Cardiothoracic Vascular Surgery)

## 2022-03-20 ENCOUNTER — Encounter: Admit: 2022-03-20 | Payer: MEDICARE | Attending: Student in an Organized Health Care Education/Training Program

## 2022-03-20 ENCOUNTER — Ambulatory Visit: Admit: 2022-03-20 | Discharge: 2022-03-27 | Payer: MEDICARE

## 2022-03-20 LAB — BASIC METABOLIC PANEL
ANION GAP: 13 mmol/L (ref 5–14)
ANION GAP: 13 mmol/L (ref 5–14)
ANION GAP: 6 mmol/L (ref 5–14)
BLOOD UREA NITROGEN: 14 mg/dL (ref 9–23)
BLOOD UREA NITROGEN: 13 mg/dL (ref 9–23)
BLOOD UREA NITROGEN: 14 mg/dL (ref 9–23)
BUN / CREAT RATIO: 17
BUN / CREAT RATIO: 17
BUN / CREAT RATIO: 15
CALCIUM: 8.4 mg/dL — ABNORMAL LOW (ref 8.7–10.4)
CALCIUM: 8 mg/dL — ABNORMAL LOW (ref 8.7–10.4)
CALCIUM: 8.9 mg/dL (ref 8.7–10.4)
CHLORIDE: 107 mmol/L (ref 98–107)
CHLORIDE: 110 mmol/L — ABNORMAL HIGH (ref 98–107)
CHLORIDE: 109 mmol/L — ABNORMAL HIGH (ref 98–107)
CO2: 20 mmol/L (ref 20.0–31.0)
CO2: 20 mmol/L (ref 20.0–31.0)
CO2: 24 mmol/L (ref 20.0–31.0)
CREATININE: 0.83 mg/dL
CREATININE: 0.75 mg/dL
CREATININE: 0.91 mg/dL
EGFR CKD-EPI (2021) MALE: >90 mL/min/{1.73_m2} (ref >=60)
EGFR CKD-EPI (2021) MALE: >90 mL/min/{1.73_m2} (ref >=60)
EGFR CKD-EPI (2021) MALE: 89 mL/min/{1.73_m2} (ref >=60)
GLUCOSE RANDOM: 172 mg/dL — ABNORMAL HIGH (ref 70–99)
GLUCOSE RANDOM: 141 mg/dL (ref 70–179)
GLUCOSE RANDOM: 197 mg/dL — ABNORMAL HIGH (ref 70–99)
POTASSIUM: 3.1 mmol/L — ABNORMAL LOW (ref 3.4–4.8)
POTASSIUM: 3.1 mmol/L — ABNORMAL LOW (ref 3.4–4.8)
POTASSIUM: 7.3 mmol/L (ref 3.4–4.8)
SODIUM: 140 mmol/L (ref 135–145)
SODIUM: 143 mmol/L (ref 135–145)
SODIUM: 139 mmol/L (ref 135–145)

## 2022-03-20 LAB — BLOOD GAS CRITICAL CARE PANEL, ARTERIAL
BASE EXCESS ARTERIAL: -5.5 — ABNORMAL LOW (ref -2.0–2.0)
BASE EXCESS ARTERIAL: -7.4 — ABNORMAL LOW (ref -2.0–2.0)
BASE EXCESS ARTERIAL: -5.7 — ABNORMAL LOW (ref -2.0–2.0)
BASE EXCESS ARTERIAL: -2.7 — ABNORMAL LOW (ref -2.0–2.0)
BASE EXCESS ARTERIAL: -2.5 — ABNORMAL LOW (ref -2.0–2.0)
BASE EXCESS ARTERIAL: -3.3 — ABNORMAL LOW (ref -2.0–2.0)
CALCIUM IONIZED ARTERIAL (MG/DL): 4.32 mg/dL — ABNORMAL LOW (ref 4.40–5.40)
CALCIUM IONIZED ARTERIAL (MG/DL): 4.48 mg/dL (ref 4.40–5.40)
CALCIUM IONIZED ARTERIAL (MG/DL): 5.85 mg/dL — ABNORMAL HIGH (ref 4.40–5.40)
CALCIUM IONIZED ARTERIAL (MG/DL): 5.24 mg/dL (ref 4.40–5.40)
CALCIUM IONIZED ARTERIAL (MG/DL): 4.96 mg/dL (ref 4.40–5.40)
CALCIUM IONIZED ARTERIAL (MG/DL): 5.27 mg/dL (ref 4.40–5.40)
GLUCOSE WHOLE BLOOD: 175 mg/dL (ref 70–179)
GLUCOSE WHOLE BLOOD: 128 mg/dL (ref 70–179)
GLUCOSE WHOLE BLOOD: 118 mg/dL (ref 70–179)
GLUCOSE WHOLE BLOOD: 143 mg/dL (ref 70–179)
GLUCOSE WHOLE BLOOD: 196 mg/dL — ABNORMAL HIGH (ref 70–179)
GLUCOSE WHOLE BLOOD: 205 mg/dL — ABNORMAL HIGH (ref 70–179)
HCO3 ARTERIAL: 20 mmol/L — ABNORMAL LOW (ref 22–27)
HCO3 ARTERIAL: 18 mmol/L — ABNORMAL LOW (ref 22–27)
HCO3 ARTERIAL: 22 mmol/L (ref 22–27)
HCO3 ARTERIAL: 22 mmol/L (ref 22–27)
HCO3 ARTERIAL: 23 mmol/L (ref 22–27)
HCO3 ARTERIAL: 24 mmol/L (ref 22–27)
HEMOGLOBIN BLOOD GAS: 12.4 g/dL — ABNORMAL LOW
HEMOGLOBIN BLOOD GAS: 10.3 g/dL — ABNORMAL LOW
HEMOGLOBIN BLOOD GAS: 11.2 g/dL — ABNORMAL LOW
HEMOGLOBIN BLOOD GAS: 9.9 g/dL — ABNORMAL LOW
HEMOGLOBIN BLOOD GAS: 10.6 g/dL — ABNORMAL LOW
HEMOGLOBIN BLOOD GAS: 10.9 g/dL — ABNORMAL LOW
LACTATE BLOOD ARTERIAL: 5.5 mmol/L (ref <1.3)
LACTATE BLOOD ARTERIAL: 5.7 mmol/L (ref <1.3)
LACTATE BLOOD ARTERIAL: 4.7 mmol/L (ref <1.3)
LACTATE BLOOD ARTERIAL: 1.9 mmol/L — ABNORMAL HIGH (ref <1.3)
LACTATE BLOOD ARTERIAL: 1.5 mmol/L — ABNORMAL HIGH (ref <1.3)
LACTATE BLOOD ARTERIAL: 1.5 mmol/L — ABNORMAL HIGH (ref <1.3)
O2 SATURATION ARTERIAL: 94.1 % (ref 94.0–100.0)
O2 SATURATION ARTERIAL: 96.2 % (ref 94.0–100.0)
O2 SATURATION ARTERIAL: 96.7 % (ref 94.0–100.0)
O2 SATURATION ARTERIAL: 98.2 % (ref 94.0–100.0)
O2 SATURATION ARTERIAL: 95.9 % (ref 94.0–100.0)
O2 SATURATION ARTERIAL: 96.1 % (ref 94.0–100.0)
PCO2 ARTERIAL: 42.7 mmHg (ref 35.0–45.0)
PCO2 ARTERIAL: 37.6 mmHg (ref 35.0–45.0)
PCO2 ARTERIAL: 62.3 mmHg — ABNORMAL HIGH (ref 35.0–45.0)
PCO2 ARTERIAL: 44.7 mmHg (ref 35.0–45.0)
PCO2 ARTERIAL: 53.8 mmHg — ABNORMAL HIGH (ref 35.0–45.0)
PCO2 ARTERIAL: 54.7 mmHg — ABNORMAL HIGH (ref 35.0–45.0)
PH ARTERIAL: 7.29 — ABNORMAL LOW (ref 7.35–7.45)
PH ARTERIAL: 7.3 — ABNORMAL LOW (ref 7.35–7.45)
PH ARTERIAL: 7.17 — CL (ref 7.35–7.45)
PH ARTERIAL: 7.32 — ABNORMAL LOW (ref 7.35–7.45)
PH ARTERIAL: 7.25 — ABNORMAL LOW (ref 7.35–7.45)
PH ARTERIAL: 7.26 — ABNORMAL LOW (ref 7.35–7.45)
PO2 ARTERIAL: 82.7 mmHg (ref 80.0–110.0)
PO2 ARTERIAL: 99.7 mmHg (ref 80.0–110.0)
PO2 ARTERIAL: 107 mmHg (ref 80.0–110.0)
PO2 ARTERIAL: 137 mmHg — ABNORMAL HIGH (ref 80.0–110.0)
PO2 ARTERIAL: 101 mmHg (ref 80.0–110.0)
PO2 ARTERIAL: 96.6 mmHg (ref 80.0–110.0)
POTASSIUM WHOLE BLOOD: 2.9 mmol/L — ABNORMAL LOW (ref 3.4–4.6)
POTASSIUM WHOLE BLOOD: 3.1 mmol/L — ABNORMAL LOW (ref 3.4–4.6)
POTASSIUM WHOLE BLOOD: 4.5 mmol/L (ref 3.4–4.6)
POTASSIUM WHOLE BLOOD: 5.6 mmol/L — ABNORMAL HIGH (ref 3.4–4.6)
POTASSIUM WHOLE BLOOD: 6.5 mmol/L (ref 3.4–4.6)
POTASSIUM WHOLE BLOOD: 7.8 mmol/L (ref 3.4–4.6)
SODIUM WHOLE BLOOD: 136 mmol/L (ref 135–145)
SODIUM WHOLE BLOOD: 138 mmol/L (ref 135–145)
SODIUM WHOLE BLOOD: 138 mmol/L (ref 135–145)
SODIUM WHOLE BLOOD: 138 mmol/L (ref 135–145)
SODIUM WHOLE BLOOD: 132 mmol/L — ABNORMAL LOW (ref 135–145)
SODIUM WHOLE BLOOD: 135 mmol/L (ref 135–145)

## 2022-03-20 LAB — FIBRINOGEN: FIBRINOGEN LEVEL: 149 mg/dL — ABNORMAL LOW (ref 175–500)

## 2022-03-20 LAB — CBC
HEMATOCRIT: 34.4 % — ABNORMAL LOW (ref 39.0–48.0)
HEMOGLOBIN: 11.5 g/dL — ABNORMAL LOW (ref 12.9–16.5)
MEAN CORPUSCULAR HEMOGLOBIN CONC: 33.6 g/dL (ref 32.0–36.0)
MEAN CORPUSCULAR HEMOGLOBIN: 30.9 pg (ref 25.9–32.4)
MEAN CORPUSCULAR VOLUME: 92 fL (ref 77.6–95.7)
MEAN PLATELET VOLUME: 7.7 fL (ref 6.8–10.7)
PLATELET COUNT: 146 10*9/L — ABNORMAL LOW (ref 150–450)
RED BLOOD CELL COUNT: 3.74 10*12/L — ABNORMAL LOW (ref 4.26–5.60)
RED CELL DISTRIBUTION WIDTH: 13.5 % (ref 12.2–15.2)
WBC ADJUSTED: 15.5 10*9/L — ABNORMAL HIGH (ref 3.6–11.2)

## 2022-03-20 LAB — HEPATIC FUNCTION PANEL
ALBUMIN: 3.5 g/dL (ref 3.4–5.0)
ALKALINE PHOSPHATASE: 41 U/L — ABNORMAL LOW (ref 46–116)
ALT (SGPT): 12 U/L (ref 10–49)
AST (SGOT): 31 U/L (ref <=34)
BILIRUBIN DIRECT: 0.2 mg/dL (ref 0.00–0.30)
BILIRUBIN TOTAL: 0.6 mg/dL (ref 0.3–1.2)
PROTEIN TOTAL: 5.8 g/dL (ref 5.7–8.2)

## 2022-03-20 LAB — O2 SATURATION VENOUS: O2 SATURATION VENOUS: 78 % (ref 40.0–85.0)

## 2022-03-20 LAB — PROTIME-INR
INR: 1.18
PROTIME: 13.2 s — ABNORMAL HIGH (ref 9.9–12.6)

## 2022-03-20 LAB — MAGNESIUM
MAGNESIUM: 1.8 mg/dL (ref 1.6–2.6)
MAGNESIUM: 1.5 mg/dL — ABNORMAL LOW (ref 1.6–2.6)

## 2022-03-20 LAB — APTT
APTT: 26.8 s (ref 24.8–38.4)
HEPARIN CORRELATION: 0.2

## 2022-03-20 LAB — PHOSPHORUS: PHOSPHORUS: 0.8 mg/dL — CL (ref 2.4–5.1)

## 2022-03-20 LAB — POTASSIUM: POTASSIUM: 3.1 mmol/L — ABNORMAL LOW (ref 3.4–4.8)

## 2022-03-20 MED ADMIN — heparin (porcine) 5,000 unit/mL injection: @ 16:00:00 | Stop: 2022-03-20

## 2022-03-20 MED ADMIN — Propofol (DIPRIVAN) injection: INTRAVENOUS | @ 13:00:00 | Stop: 2022-03-20

## 2022-03-20 MED ADMIN — heparin (porcine) 5,000 unit/mL injection: @ 17:00:00 | Stop: 2022-03-20

## 2022-03-20 MED ADMIN — calcium chloride 100 mg/mL (10 %) syringe 1 g: 1 g | INTRAVENOUS | @ 19:00:00

## 2022-03-20 MED ADMIN — albumin human 5 % 5 % bottle 12.5 g: 12.5 g | INTRAVENOUS | @ 23:00:00 | Stop: 2022-03-20

## 2022-03-20 MED ADMIN — fentaNYL (PF) (SUBLIMAZE) injection: INTRAVENOUS | @ 18:00:00 | Stop: 2022-03-20

## 2022-03-20 MED ADMIN — phenylephrine 0.8 mg/10 mL (80 mcg/mL) injection: INTRAVENOUS | @ 13:00:00 | Stop: 2022-03-20

## 2022-03-20 MED ADMIN — fentaNYL (PF) (SUBLIMAZE) injection 50 mcg: 50 ug | INTRAVENOUS | @ 21:00:00 | Stop: 2022-03-21

## 2022-03-20 MED ADMIN — aminocaproic acid (AMICAR) injection: INTRAVENOUS | @ 17:00:00 | Stop: 2022-03-20

## 2022-03-20 MED ADMIN — heparin (porcine) 1000 unit/mL injection: INTRAVENOUS | @ 15:00:00 | Stop: 2022-03-20

## 2022-03-20 MED ADMIN — insulin regular (HumuLIN,NovoLIN) injection: SUBCUTANEOUS | @ 16:00:00 | Stop: 2022-03-20

## 2022-03-20 MED ADMIN — potassium chloride 20 mEq in 100 mL IVPB Premix: 20 meq | INTRAVENOUS | @ 21:00:00 | Stop: 2022-03-27

## 2022-03-20 MED ADMIN — insulin regular 100 unit/100 mL (1 unit/mL) in sodium chloride 0.9% 100 mL: 0-50 [IU]/h | INTRAVENOUS | @ 19:00:00

## 2022-03-20 MED ADMIN — aminocaproic acid (AMICAR) injection: @ 16:00:00 | Stop: 2022-03-20

## 2022-03-20 MED ADMIN — propofol (DIPRIVAN) infusion 10 mg/mL: 0-50 ug/kg/min | INTRAVENOUS | @ 19:00:00

## 2022-03-20 MED ADMIN — vasopressin (VASOSTRICT) injection: INTRAVENOUS | @ 16:00:00 | Stop: 2022-03-20

## 2022-03-20 MED ADMIN — phenylephrine 0.8 mg/10 mL (80 mcg/mL) injection: INTRAVENOUS | @ 14:00:00 | Stop: 2022-03-20

## 2022-03-20 MED ADMIN — albumin human 5 % 5 % bottle: INTRAVENOUS | @ 23:00:00 | Stop: 2022-03-20

## 2022-03-20 MED ADMIN — melatonin tablet 6 mg: 6 mg | GASTROENTERAL | @ 23:00:00

## 2022-03-20 MED ADMIN — oxyCODONE (ROXICODONE) immediate release tablet 10 mg: 10 mg | GASTROENTERAL | @ 20:00:00 | Stop: 2022-04-03

## 2022-03-20 MED ADMIN — fentaNYL (PF) (SUBLIMAZE) injection: INTRAVENOUS | @ 13:00:00 | Stop: 2022-03-20

## 2022-03-20 MED ADMIN — insulin regular (HumuLIN,NovoLIN) 100 Units in sodium chloride (NS) 0.9 % 100 mL infusion: INTRAVENOUS | @ 16:00:00 | Stop: 2022-03-20

## 2022-03-20 MED ADMIN — lidocaine (XYLOCAINE) 20 mg/mL (2 %) injection: INTRAVENOUS | @ 13:00:00 | Stop: 2022-03-20

## 2022-03-20 MED ADMIN — calcium chloride 100 mg/mL (10 %) injection: INTRAVENOUS | @ 17:00:00 | Stop: 2022-03-20

## 2022-03-20 MED ADMIN — EPINEPHrine 8 mg in dextrose 5% 250 mL (32 mcg/mL) infusion PMB: .02 ug/kg/min | INTRAVENOUS | @ 19:00:00 | Stop: 2022-03-20

## 2022-03-20 MED ADMIN — glycopyrrolate (ROBINUL) injection: INTRAVENOUS | @ 15:00:00 | Stop: 2022-03-20

## 2022-03-20 MED ADMIN — ropivacaine (NAROPIN) 5 mg/mL (0.5 %) injection: PERINEURAL | @ 12:00:00 | Stop: 2022-03-20

## 2022-03-20 MED ADMIN — docusate (COLACE) oral liquid: 100 mg | GASTROENTERAL | @ 20:00:00

## 2022-03-20 MED ADMIN — heparin 30,000 units (CELL SAVER) in 1000 mL NS: @ 15:00:00 | Stop: 2022-03-20

## 2022-03-20 MED ADMIN — lactated ringers bolus 250 mL: 250 mL | INTRAVENOUS | @ 22:00:00 | Stop: 2022-03-20

## 2022-03-20 MED ADMIN — sugammadex (BRIDION) injection: INTRAVENOUS | @ 19:00:00 | Stop: 2022-03-20

## 2022-03-20 MED ADMIN — colchicine (COLCRYS) tablet 0.6 mg: .6 mg | GASTROENTERAL | @ 20:00:00 | Stop: 2022-03-24

## 2022-03-20 MED ADMIN — magnesium oxide (MAG-OX) tablet 400 mg: 400 mg | GASTROENTERAL | @ 20:00:00

## 2022-03-20 MED ADMIN — polyethylene glycol (MIRALAX) packet 17 g: 17 g | GASTROENTERAL | @ 23:00:00

## 2022-03-20 MED ADMIN — Propofol (DIPRIVAN) injection: INTRAVENOUS | @ 14:00:00 | Stop: 2022-03-20

## 2022-03-20 MED ADMIN — NORepinephrine 8 mg in dextrose 5 % 250 mL (32 mcg/mL) infusion PMB: INTRAVENOUS | @ 20:00:00 | Stop: 2022-03-20

## 2022-03-20 MED ADMIN — insulin regular (HumuLIN,NovoLIN) injection: SUBCUTANEOUS | @ 17:00:00 | Stop: 2022-03-20

## 2022-03-20 MED ADMIN — EPINEPHrine 8 mg in dextrose 5% 250 mL (32 mcg/mL) infusion PMB: INTRAVENOUS | @ 17:00:00 | Stop: 2022-03-20

## 2022-03-20 MED ADMIN — lactated ringers bolus 500 mL: 500 mL | INTRAVENOUS | @ 20:00:00 | Stop: 2022-03-20

## 2022-03-20 MED ADMIN — protamine injection: INTRAVENOUS | @ 17:00:00 | Stop: 2022-03-20

## 2022-03-20 MED ADMIN — lactated ringers bolus 500 mL: 500 mL | INTRAVENOUS | @ 21:00:00 | Stop: 2022-03-20

## 2022-03-20 MED ADMIN — albumin human 25 % 25 %: @ 16:00:00 | Stop: 2022-03-20

## 2022-03-20 MED ADMIN — lactated Ringers infusion: 10 mL/h | INTRAVENOUS | @ 12:00:00

## 2022-03-20 MED ADMIN — vasopressin (VASOSTRICT) injection: INTRAVENOUS | @ 15:00:00 | Stop: 2022-03-20

## 2022-03-20 MED ADMIN — potassium chloride 20 mEq in 100 mL IVPB Premix: 20 meq | INTRAVENOUS | Stop: 2022-03-27

## 2022-03-20 MED ADMIN — sodium chloride irrigation (NS) 0.9 % irrigation solution: @ 14:00:00 | Stop: 2022-03-20

## 2022-03-20 MED ADMIN — fentaNYL (PF) (SUBLIMAZE) injection: INTRAVENOUS | @ 14:00:00 | Stop: 2022-03-20

## 2022-03-20 MED ADMIN — heparin 1,000 units/500 mL (2 units/mL) in 0.9% NaCl infusion: @ 13:00:00 | Stop: 2022-03-20

## 2022-03-20 MED ADMIN — electrolyte-A (PLASMA-LYT A) infusion: INTRAVENOUS | @ 17:00:00 | Stop: 2022-03-20

## 2022-03-20 MED ADMIN — potassium chloride (KLOR-CON) packet 20 mEq: 20 meq | GASTROENTERAL | @ 20:00:00

## 2022-03-20 MED ADMIN — EPINEPHrine 100 mcg/10 mL (10 mcg/mL) injection: INTRAVENOUS | @ 16:00:00 | Stop: 2022-03-20

## 2022-03-20 MED ADMIN — succinylcholine chloride (ANECTINE) injection: INTRAVENOUS | @ 13:00:00 | Stop: 2022-03-20

## 2022-03-20 MED ADMIN — sodium chloride (NS) 0.9 % infusion: INTRAVENOUS | @ 14:00:00 | Stop: 2022-03-20

## 2022-03-20 MED ADMIN — famotidine (PF) (PEPCID) injection 20 mg: 20 mg | INTRAVENOUS | @ 21:00:00

## 2022-03-20 MED ADMIN — fentaNYL (PF) (SUBLIMAZE) injection: INTRAVENOUS | @ 12:00:00 | Stop: 2022-03-20

## 2022-03-20 MED ADMIN — vasopressin 20 Units in sodium chloride 0.9 % 100 mL (0.2 Units/mL) infusion: INTRAVENOUS | @ 18:00:00 | Stop: 2022-03-20

## 2022-03-20 MED ADMIN — NORepinephrine 8 mg in dextrose 5 % 250 mL (32 mcg/mL) infusion PMB: INTRAVENOUS | @ 14:00:00 | Stop: 2022-03-20

## 2022-03-20 MED ADMIN — cefuroxime (ZINACEF) 1.5 g in sodium chloride 0.9 % (NS) 100 mL IVPB-MBP: 1.5 g | INTRAVENOUS | @ 14:00:00 | Stop: 2022-03-20

## 2022-03-20 MED ADMIN — calcium chloride 100 mg/mL (10 %) syringe 1 g: 1 g | INTRAVENOUS | @ 21:00:00 | Stop: 2022-03-20

## 2022-03-20 MED ADMIN — mannitol injection: @ 16:00:00 | Stop: 2022-03-20

## 2022-03-20 MED ADMIN — lactated ringers bolus: @ 16:00:00 | Stop: 2022-03-20

## 2022-03-20 MED ADMIN — vancomycin (VANCOCIN) topical powder: TOPICAL | @ 13:00:00 | Stop: 2022-03-20

## 2022-03-20 MED ADMIN — mupirocin (BACTROBAN) 2 % ointment: NASAL | @ 20:00:00 | Stop: 2022-03-25

## 2022-03-20 MED ADMIN — blood cardioplegia Del Nido solution: @ 16:00:00 | Stop: 2022-03-20

## 2022-03-20 MED ADMIN — potassium chloride 20 mEq in 100 mL IVPB Premix: 20 meq | INTRAVENOUS | @ 22:00:00 | Stop: 2022-03-27

## 2022-03-20 MED ADMIN — glycopyrrolate (ROBINUL) injection: INTRAVENOUS | @ 14:00:00 | Stop: 2022-03-20

## 2022-03-20 MED ADMIN — ROCuronium (ZEMURON) injection: INTRAVENOUS | @ 13:00:00 | Stop: 2022-03-20

## 2022-03-20 MED ADMIN — ROCuronium (ZEMURON) injection: INTRAVENOUS | @ 16:00:00 | Stop: 2022-03-20

## 2022-03-20 MED ADMIN — aminocaproic acid (AMICAR) injection: INTRAVENOUS | @ 15:00:00 | Stop: 2022-03-20

## 2022-03-20 MED ADMIN — electrolyte-A (PLASMA-LYT A) infusion: 500 mL | INTRAVENOUS | @ 20:00:00 | Stop: 2022-03-20

## 2022-03-20 MED ADMIN — cefuroxime (ZINACEF) injection: @ 16:00:00 | Stop: 2022-03-20

## 2022-03-20 MED ADMIN — papaverine injection: TOPICAL | @ 13:00:00 | Stop: 2022-03-20

## 2022-03-20 MED ADMIN — aminocaproic acid (AMICAR) injection: @ 17:00:00 | Stop: 2022-03-20

## 2022-03-20 MED ADMIN — electrolyte-A (PLASMA-LYT A) infusion: INTRAVENOUS | @ 14:00:00 | Stop: 2022-03-20

## 2022-03-20 NOTE — Unmapped (Signed)
Brief Operative Note  (CSN: [id number])      Date of Surgery: 03/20/2022    Pre-op Diagnosis: CAD    Post-op Diagnosis: CAD    Procedure(s):  CORONARY ARTERY BYPASS, USING ARTERIAL GRAFT(S); SINGLE ARTERIAL GRAFT: 33533 (CPT??)  ENDOSCOPY, SURGICAL, INCLUDING VIDEO-ASSISTED HARVEST OF VEIN(S) FOR CORONARY ARTERY BYPASS PROCEDURE: 33508 (CPT??)  CORONARY ARTERY BYPASS, USING VENOUS GRAFT(S) AND ARTERIAL GRAFT(S); SINGLE VEIN GRAFT: 33517 (CPT??)  Note: Revisions to procedures should be made in chart - see Procedures activity.    Performing Service: Cardiac Surgery  Surgeon(s) and Role:     * Nysa Sarin, Hanley Hays, MD - Primary     * Alcide Evener Cordie Grice, MD - Resident - Assisting     * Tasoudis, Panagiotis, MD - Resident - Assisting    Assistant: Physician Assistant: Tawanna Cooler, PA; Malachy Chamber, [place]    Findings: Severe Multivessel CAD    Anesthesia: General    Estimated Blood Loss: 200 ml. CPB    Complications: None    Specimens: None collected    Implants:   Implant Name Type Inv. Item Serial No. Manufacturer Lot No. LRB No. Used Action   Graft Markers [employer] - [id number] Graft [employer] [id number]  1 Implanted       Surgeon Notes: I was present and scrubbed for the entire procedure    [name], MD   Date: 03/20/2022  Time: 12:38 PM

## 2022-03-20 NOTE — Unmapped (Cosign Needed)
CVT ICU CRITICAL CARE ADMISSION NOTE     Date of Service: 03/20/2022    Hospital Day: LOS: 0 days        Surgery Date: 11/27  Surgical Attending: Arlester Marker, MD    Critical Care Attending: Gordy Clement, MD    Interval History:   S/p CABG. Arrived on epi 0.05 for vasoactive support. Weaning that expeditiously due to robust CI. Norepi started. Volume responsive thus far. Chest tube output appropriate. Trending lactic acidosis. Weaning sedation to sbt.    History of Present Illness:   Christopher [name] is a 71 y.o. male w/ angina, CAD (LAD and OM), HLD, T2DM (HgbA1C 7.9), HTN, statin intolerance, TIA     Hospital Course:  11/27 CABG x2 (LIMA to LAD, RSVG to OM), no TEE due to esophageal stricture     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 *     ASSESSMENT & PLAN:     Cardiovascular: Postoperative state from CABG  - No TEE done 2/2 esophageal stricture. Normal EF on TTE.  - Intraoperative blood products: 600 mL cell saver returned, 340 autologous blood returned  - Goal MAP 65-64mmHg, CI >2.2, SvO2 60-80   - Epi 0.02, weaning   - NE  - Mediastinal tubes to -20 cm suction, monitor output  - Epicardial pacing wires: A's only    Respiratory: Post-operative mechanical ventilation  - Goal SpO2 >92%, wean oxygen as tolerated  - Aggressive pulmonary hygiene, encourage IS  - Pleural drains to -20cm suction, monitor output    Neurologic: Post-operative pain  - Pain: Fentanyl PRN, oxy PRN  - Sedation: Prop  - Frailty score:     - Frail (3-5)   - Pre-frail (1-2)   - Robust (0)    Renal/Genitourinary:  NAI  - Replete electrolytes PRN  - Strict I&O    Gastrointestinal: Gastrointestinal bleed  - Diet: NPO while intubated, ADAT once extubated  - GI prophylaxis: Pepcid  - Bowel regimen: miralax, colace    Endocrine:  T2DM  - Glycemic Control: Endotool    Hematologic: Acute blood loss anemia secondary to cardiac surgery    - Monitor CBC daily, transfuse products as indicated  - Monitor chest tube output    Immunologic/Infectious Disease:  - Afebrile, leukocytosis likely secondary to post-operative inflammatory state, continue to monitor    Integumentary  - Skin bundle discussed on AM rounds? Yes  - Pressure injury present on admission to CVTICU? No  - Is CWOCN consulted? No  - Are any CWOCN verified pressure injuries present since admission to CVTICU? No    Mobility   Mobility level 1. HOB elevated to 30* for 1 hour (2x per shift)   PT/OT consulted Yes.    Daily Care Checklist:   - Stress Ulcer Prevention: Yes: Mechanically ventilated  - DVT Prophylaxis: Mechanical: Yes.  - HOB >  30 degrees: Yes   - Daily Awakening: Yes  - Spontaneous Breathing Trial: Yes  - Indication for Beta Blockade: No  - Indication for Central/PICC Line: Yes  Infusions requiring central access, Hemodynamic monitoring, and Aggressive fluid resuscitation  - Indication for Urinary Catheter: Yes  Strict intake and output, Critically ill, and Agressive diuresis/hydration  - Diagnostic images/reports of past 24hrs reviewed: Yes    Disposition:   - Continue ICU care    [name] is critically ill due to: Postoperative state from CABG, lactic acidosis, postoperative mechanical ventilation, hypotension, surgical pain  This critical care time includes examining the patient, evaluating the hemodynamic, laboratory, and radiographic data, independently developing a comprehensive management plan, and serially assessing the patient's response to these critical care interventions. This critical care time excludes procedures.    Critical care time: 65 minutes     Vernetta Honey, PA   Cardiovascular and Thoracic Intensive Care Unit  Department of Anesthesiology  [facility]     SUBJECTIVE:      Intubated/sedated     OBJECTIVE:     Physical Exam:  Constitutional: M appearing stated age, NAD  Neurologic: Sedated, PERRL, no focal deficits  Respiratory: ETT in place, equal chest movement bilaterally  Cardiovascular: RRR, minimal edema  Gastrointestinal:  Flat, NTND  Musculoskeletal: Sedated, age appropriate bulk/tone  Skin: Warm, dry  Sternotomy incision: Primary OR dressing c/d/i    Temp:  [36 ??C (96.8 ??F)] 36 ??C (96.8 ??F)  Core Temp:  [35.9 ??C (96.6 ??F)-36 ??C (96.8 ??F)] 35.9 ??C (96.6 ??F)  Heart Rate:  [68-89] 87  SpO2 Pulse:  [85-89] 87  Resp:  [18-24] 23  A BP-1: (92-112)/(50-85) 112/64  MAP:  [67 mmHg-89 mmHg] 81 mmHg  FiO2 (%):  [40 %-70 %] 50 %  SpO2:  [92 %-98 %] 95 %  CVP:  [0 mmHg-14 mmHg] 13 mmHg    Recent Laboratory Results:  Recent Labs   Lab Units 03/20/22  1340   PH ART  7.29*   PCO2 ART mm Hg 42.7   PO2 ART mm Hg 82.7   HCO3 ART mmol/L 20*   BASE EXC ART  -5.5*   O2 SAT ART % 94.1     Recent Labs   Lab Units 03/20/22  1340 03/20/22  1206   SODIUM WHOLE BLOOD mmol/L 136 137   SODIUM mmol/L 140  --    POTASSIUM WHOLE BLOOD mmol/L 2.9* 3.7   POTASSIUM mmol/L 3.1* - 3.1*  --    CHLORIDE mmol/L 107  --    CO2 mmol/L 20.0  --    BUN mg/dL 14  --    CREATININE mg/dL 1.61  --    GLUCOSE mg/dL 096*  --      Lab Results   Component Value Date    BILITOT 0.6 03/20/2022    BILITOT 0.4 03/01/2022    BILIDIR 0.20 03/20/2022    ALT 12 03/20/2022    ALT 14 03/01/2022    AST 31 03/20/2022    AST 21 03/01/2022    ALKPHOS 41 (L) 03/20/2022    ALKPHOS 61 03/01/2022    PROT 5.8 03/20/2022    PROT 7.7 03/01/2022    ALBUMIN 3.5 03/20/2022    ALBUMIN 4.1 03/01/2022     Recent Labs   Lab Units 03/20/22  0624   POC GLUCOSE mg/dL 045     Recent Labs   Lab Units 03/20/22  1340 03/20/22  1206 03/20/22  1136   WBC 10*9/L 15.5*  --   --    RBC 10*12/L 3.74*  --   --    HEMOGLOBIN g/dL 16.1*  --   --    HEMOGLOBIN BG g/dL  --  9.3* 8.9*   HEMATOCRIT % 34.4*  --   --    MCV fL 92.0  --   --    MCH pg 30.9  --   --    MCHC g/dL 09.6  --   --    RDW % 13.5  --   --    PLATELET COUNT (1) 10*9/L 146*  --   --    MPV fL 7.7  --   --      Recent Labs   Lab Units 03/20/22  1340   INR  1.18   APTT sec 26.8      Lines & Tubes:   Patient Lines/Drains/Airways Status       Active Peripheral & Central Intravenous Access       Name Placement date Placement time Site Days    Peripheral IV 03/20/22 Left Hand 03/20/22  0710  Hand  less than 1    CVC Triple Lumen 03/20/22 Right Internal jugular 03/20/22  1300  Internal jugular  less than 1    CVC MAC Introducer 03/20/22 Right Internal jugular 03/20/22  1300  Internal jugular  less than 1                  Urethral Catheter Non-latex;Straight-tip;Temperature probe 16 Fr. (Active)   Output (mL) 150 mL 03/20/22 1335   Number of days: 0     Patient Lines/Drains/Airways Status       Active Wounds       Name Placement date Placement time Site Days    Surgical Site 03/20/22 Chest Mid 03/20/22  1000  -- less than 1    Surgical Site 03/20/22 Leg Right 03/20/22  1233  -- less than 1                     Respiratory/ventilator settings for last 24 hours:   Vent Mode: PRVC  FiO2 (%): 50 %  S RR: 18  S VT: 450 mL  PEEP: 8 cm H20    Intake/Output last 3 shifts:  No intake/output data recorded.    Daily/Recent Weight:  84.1 kg (185 lb 6.5 oz)    BMI:  Body mass index is 29.94 kg/m??.                                  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 01/16/2019    Depression 2016    Diabetes mellitus (CMS-HCC)     GERD (gastroesophageal reflux disease) 1982    Hypertension     Meningitis 12/25/2015    Mixed hyperlipidemia     Myocardial infarction (CMS-HCC) 1986  Obesity 1986    first back surgery    Peptic ulceration      Past Surgical History:   Procedure Laterality Date    BACK SURGERY      CARDIAC CATHETERIZATION      STENTS PLACEMENT     CARDIAC SURGERY  2005    stents    CORONARY STENT PLACEMENT      FOREIGN BODY REMOVAL Right about 20 years ago    HERNIA REPAIR      RIGHT INGUNIAL    JOINT REPLACEMENT Left     TOTAL SHOULDER    PR ARM/ELBOW TENDON LENGTHEN,SINGLE,EA Left 02/17/2019    Procedure: TENDON LENGTHENING UPPER ARM/ELBOW SNGL EA;  Surgeon: Daisy Lazar, MD;  Location: [facility];  Service: Orthopedics    PR CATH PLACE/CORON ANGIO, IMG SUPER/INTERP,W LEFT HEART VENTRICULOGRAPHY N/A 02/01/2022    Procedure: Left Heart Catheterization;  Surgeon: Jacquelyne Balint, MD;  Location: [facility] CATH;  Service: Cardiology    PR OSTEOTOMY 1ST METATARSAL,BASE/SHAFT Left 07/20/2017    Procedure: OSTEOTOMY, W/WO LENGTHENING, SHORTENING OR ANGULAR CORRECTION, METATARSAL; FIRST METATARSAL;  Surgeon: Valda Favia, MD;  Location: [facility] OR [facility];  Service: Ortho Foot & Ankle    PR OSTEOTOMY HEEL BONE Left 07/20/2017    Procedure: OSTEOTOMYLoistine Chance W/WO INT FIXA;  Surgeon: Valda Favia, MD;  Location: [facility] OR [facility];  Service: Ortho Foot & Ankle    PR OSTEOTOMY METATARSAL (NOT 1ST) Left 06/06/2018    Procedure: OSTEOTOMY, W/WO LENGTHENING, SHORTENING OR ANGULAR CORRECTION, METATARSAL; OTHER THAN FIRST METATARSAL, EA;  Surgeon: Valda Favia, MD;  Location: [facility] OR [facility];  Service: Orthopedics    PR PART REMV PHALANX OF TOE Left 06/06/2018    Procedure: PART EXC BONE PHALANX TOE;  Surgeon: Valda Favia, MD;  Location: [facility] OR [facility];  Service: Orthopedics    PR RECONSTR TOTAL SHOULDER IMPLANT Left 11/12/2018    Procedure: R28   ARTHROPLASTY, GLENOHUMERAL JOINT; TOTAL SHOULDER(GLENOID & PROXIMAL HUMERAL REPLACEMENT(EG, TOTAL SHOULDER);  Surgeon: Nickola Major, MD;  Location: [facility] OR [facility];  Service: Orthopedics    PR REMOVAL DEEP IMPLANT Left 11/12/2018    Procedure: REMOVE IMPLANT; DEEP SHOULDER;  Surgeon: Nickola Major, MD;  Location: [facility] OR [facility];  Service: Orthopedics    PR REPAIR BICEPS LONG TENDON Left 11/12/2018    Procedure: TENODESIS LONG TENDON BICEPS;  Surgeon: Nickola Major, MD;  [facility]: [facility] OR [facility];  Service: Orthopedics    PR REPAIR PERONEAL TENDONS,FIB OSTEOTMY Left 07/20/2017    Procedure: REPR DISLOC PERONEAL TENDONS; W/FIB OSTEOTOMY;  Surgeon: Valda Favia, MD;  Location: [facility] OR [facility];  Service: Ortho Foot & Ankle    PR REVISE MEDIAN N/CARPAL TUNNEL SURG Left 02/17/2019    Procedure: R22 NEUROPLASTY AND/OR TRANSPOSITION; MEDIAN NERVE AT CARPAL TUNNEL;  Surgeon: Daisy Lazar, MD;  Location: [facility] OR [facility];  Service: Orthopedics    PR REVISE ULNAR NERVE AT ELBOW Left 02/17/2019    Procedure: NEUROPLASTY AND/OR TRANSPOSITION; ULNAR NERVE AT ELBOW;  Surgeon: Daisy Lazar, MD;  Location: [facility] OR [facility];  Service: Orthopedics    SHOULDER SURGERY Bilateral     L TSA, R ROTATOR CUFF      SKIN BIOPSY      SPINE SURGERY  1986/1995/2000/2002    LUMBAR FUSION    stent       Scheduled Medications:   cefuroxime  1.5 g Intravenous Q12H    colchicine  0.6  mg Enteral tube: gastric BID    docusate  100 mg Enteral tube: gastric BID    electrolyte-A  500 mL Intravenous Once    famotidine (PEPCID) IV  20 mg Intravenous BID    Or    famotidine  20 mg Enteral tube: gastric BID    lactated ringers  500 mL Intravenous Once    magnesium oxide  400 mg Enteral tube: gastric BID    melatonin 6 mg Enteral tube: gastric QPM    mupirocin   Each Nare BID    naloxegoL  25 mg Enteral tube: gastric Daily    polyethylene glycol  17 g Enteral tube: gastric 3xd Meals    potassium chloride  20 mEq Enteral tube: gastric BID     Continuous Infusions:   EPINEPHrine 0.04 mcg/kg/min (03/20/22 1355)    insulin regular 5 Units/hr (03/20/22 1356)    lactated Ringers Stopped (03/20/22 0837)    propofol 10 mg/mL infusion 50 mcg/kg/min (03/20/22 1355)    sodium chloride       PRN Medications:  acetaminophen, calcium chloride **AND** Notify Provider **AND** Obtain lab:   CALCIUM, IONIZED, dextrose in water, fentaNYL (PF), ipratropium-albuteroL, magnesium sulfate in water **AND** Notify Provider **AND** Notify Provider **AND** Obtain lab:   MAGNESIUM, SERUM, ondansetron, oxyCODONE, oxyCODONE, potassium chloride in water **OR** potassium chloride in water

## 2022-03-20 NOTE — Unmapped (Signed)
PATIENT NAME: Christopher [name], Christopher [name]   MRN:  [id number]  DATE OF SERVICE:  03/20/2022    PREOPERATIVE DIAGNOSIS:    1. Coronary Artery Disease  Past Medical History:   Diagnosis Date    Acute kidney injury (CMS-HCC) 01/12/2018    Anemia     IRON DEFICIENT     Anxiety dont know    Arthritis     Basal cell carcinoma     Cataract     CHF (congestive heart failure) (CMS-HCC)     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    Corneal abrasion 1975    steel in right eye with abrasion and removal    Cubital tunnel syndrome on left 01/16/2019    Depression 2016    Diabetes mellitus (CMS-HCC)     GERD (gastroesophageal reflux disease) 1982    Hypertension     Meningitis 12/25/2015    Mixed hyperlipidemia     Myocardial infarction (CMS-HCC) 1986    Obesity 1986    first back surgery    Peptic ulceration          POSTOPERATIVE DIAGNOSIS:    1. Coronary Artery Disease  Past Medical History:   Diagnosis Date    Acute kidney injury (CMS-HCC) 01/12/2018    Anemia     IRON DEFICIENT     Anxiety dont know    Arthritis     Basal cell carcinoma     Cataract     CHF (congestive heart failure) (CMS-HCC)     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    Corneal abrasion 1975    steel in right eye with abrasion and removal    Cubital tunnel syndrome on left 01/16/2019    Depression 2016    Diabetes mellitus (CMS-HCC)     GERD (gastroesophageal reflux disease) 1982    Hypertension     Meningitis 12/25/2015    Mixed hyperlipidemia     Myocardial infarction (CMS-HCC) 1986    Obesity 1986    first back surgery    Peptic ulceration          OPERATION PERFORMED:    1. Coronary Artery Bypass grafting of the LIMA to LAD, RSVG to OM.  CAB x 2    2. Endoscopic vein harvest of the   right  lower extremity.    SURGEON:  [name] MD    RESIDENT SURGEON:  Horris Latino MD; Elray Mcgregor MD    PA ASSISTANT Delfina Redwood) : Tawanna Cooler [facility] (No other qualified resident available; She specifically performed endovein harvest and assisted with opening and closure)    ANESTHESIA:  General endotracheal anesthesia.    ANESTHESIOLOGIST:  Joanne Chars MD    ESTIMATED BLOOD LOSS:  Pump case.    IV FLUIDS:  See Anesthesia record    DRAINS:  three 24-French Blake(s).    SPECIMEN:  None.    COMPLICATIONS:  NOne    CARDIOPULMONARY BYPASS TIME:  53 minutes.    CROSSCLAMP TIME:  35 minutes.    INDICATIONS FOR PROCEDURE:  Significant flow limiting coronary artery disease.      FINDINGS: Prior LAD stent. Good venous conduit. Good IMA conduit.    PROCEDURE:  The patient was brought to the Operating room, placed on the operating table in the supine position, and placed under general endotracheal anesthesia after which he was prepped and draped in sterile fashion.  We performed a time-out verifying patient identity, site specific for surgery, procedure to be performed as well as  appropriate perioperative antibiotic infusion.     Following that we endoscopically extracted the greater saphenous vein from the  right  lower extremity. Side branches were controlled. The vein was extracted and prepared for bypass. The leg was made hemostatic, and then closed in layers with absorbable suture followed by Dermabond.      Simultaneously that we made an incision from the sternomanubrial junction to the xiphoid dissection carried that down through subcutaneous tissue to the sternum which was opened in its midline with a reciprocating saw.  Periosteal bleeding was controlled with electrocautery and Gelfoam.      We opened the left pleural space using a mammary retractor to elevate the left hemi-sternum and we harvested the mammary artery with its concomitant veins from the level of the xiphoid to subclavian vein using a series of clips and electrocautery. After systemic heparinization it was doubly clipped distally, divided, and prepared for bypass, treated topically with papaverine and set aside.     We opened the pericardium in inverted T fashion.  Cannulation sutures for an aortic cannula, two-stage venous cannula as well as antegrade cardioplegia cannulas were all placed.  After heparinization and confirmation of adequate ACT each of the cannulae were placed and connected to bypass circuit being sure not to entrain any air.  We initiated bypass mobilizing the aorta from the pulmonary artery and began cooling towards 34 degrees.     We marked distal targets including LAD and OM2. We placed an aortic crossclamp and instilled antegrade del-Nido cardioplegia to achieve excellent diastolic arrest of the heart and then reinstalled retrograde cardioplegia every 60 minutes if needed to maintain electromechanical arrest of the heart.     We turned our attention first to the OM2, using the piece of saphenous vein and sewing to an arteriotomy creating an anastamosis to the end of the vein, using 7-0 Prolene run circumferentially.     Finally, turning our attention to the left internal mammary artery, we cut a slot in the left-sided pericardium to accommodate that, and we sewed the left internal mammary artery to the left anterior descending using a running 7-0 Prolene and two 5-0 Prolene tacking sutures. Doppler patency was confirmed and a terminal hotshot was administered.     We removed the crossclamp 35 minutes after being placed and we began rewarming to systemic normothermia, during which time we put a partial occlusion clamp on the ascending aorta, made a 4.8-mm punch site(s) and created a proximal vein graft anastomosis using 5-0 Prolene. We de-aired that and removed that partial clamp.    As we continued rewarming, we placed two atrial wires and two 24-French Blake drains. We were able to separate from cardiopulmonary bypass after a total of 53 minutes and withdraw our cannulas administering protamine. All cannulation sites were tied down. After the protamine was administered, hemostasis was excellent throughout the field. We verified excellent hemostasis, including any areas of invasion of the heart and great vessels. We closed the mediastinal soft tissues over the ascending aorta and the anterior surface of the heart. We closed the sternum with interlocking figure of eight #6 wires. We treated the sternum subcutaneous tissue topically with vancomycin powder and verified hemostasis of our wires and twisted those down. We closed the fascia with a heavy absorbable suture, subcutaneous tissue with a lighter absorbable suture, and skin edge with a running subcuticular followed by Dermabond. A sterile dressing was applied. The patient left the Operating Room to go to the ICU in stable condition,  having tolerated the procedure well. There were no complications apparent. Needle, sponge and instrument counts were correct.     I performed the operation with the help of the above listed residents and assistants and I concur with the documentation as outlined. I was present and available throughout the entirety of the procedure.    [name] MD  Division of Cardiac Surgery

## 2022-03-21 LAB — BLOOD GAS CRITICAL CARE PANEL, ARTERIAL
BASE EXCESS ARTERIAL: -3.2 — ABNORMAL LOW (ref -2.0–2.0)
BASE EXCESS ARTERIAL: -3.7 — ABNORMAL LOW (ref -2.0–2.0)
BASE EXCESS ARTERIAL: -4.3 — ABNORMAL LOW (ref -2.0–2.0)
BASE EXCESS ARTERIAL: -5.7 — ABNORMAL LOW (ref -2.0–2.0)
BASE EXCESS ARTERIAL: -5.9 — ABNORMAL LOW (ref -2.0–2.0)
BASE EXCESS ARTERIAL: -7.3 — ABNORMAL LOW (ref -2.0–2.0)
BASE EXCESS ARTERIAL: -8.6 — ABNORMAL LOW (ref -2.0–2.0)
CALCIUM IONIZED ARTERIAL (MG/DL): 4.13 mg/dL — ABNORMAL LOW (ref 4.40–5.40)
CALCIUM IONIZED ARTERIAL (MG/DL): 4.8 mg/dL (ref 4.40–5.40)
CALCIUM IONIZED ARTERIAL (MG/DL): 5 mg/dL (ref 4.40–5.40)
CALCIUM IONIZED ARTERIAL (MG/DL): 5.02 mg/dL (ref 4.40–5.40)
CALCIUM IONIZED ARTERIAL (MG/DL): 5.13 mg/dL (ref 4.40–5.40)
CALCIUM IONIZED ARTERIAL (MG/DL): 5.14 mg/dL (ref 4.40–5.40)
CALCIUM IONIZED ARTERIAL (MG/DL): 5.28 mg/dL (ref 4.40–5.40)
GLUCOSE WHOLE BLOOD: 105 mg/dL (ref 70–179)
GLUCOSE WHOLE BLOOD: 112 mg/dL (ref 70–179)
GLUCOSE WHOLE BLOOD: 113 mg/dL (ref 70–179)
GLUCOSE WHOLE BLOOD: 155 mg/dL (ref 70–179)
GLUCOSE WHOLE BLOOD: 196 mg/dL — ABNORMAL HIGH (ref 70–179)
GLUCOSE WHOLE BLOOD: 222 mg/dL — ABNORMAL HIGH (ref 70–179)
GLUCOSE WHOLE BLOOD: 94 mg/dL (ref 70–179)
HCO3 ARTERIAL: 17 mmol/L — ABNORMAL LOW (ref 22–27)
HCO3 ARTERIAL: 18 mmol/L — ABNORMAL LOW (ref 22–27)
HCO3 ARTERIAL: 19 mmol/L — ABNORMAL LOW (ref 22–27)
HCO3 ARTERIAL: 20 mmol/L — ABNORMAL LOW (ref 22–27)
HCO3 ARTERIAL: 21 mmol/L — ABNORMAL LOW (ref 22–27)
HCO3 ARTERIAL: 22 mmol/L (ref 22–27)
HCO3 ARTERIAL: 22 mmol/L (ref 22–27)
HEMOGLOBIN BLOOD GAS: 10.1 g/dL — ABNORMAL LOW
HEMOGLOBIN BLOOD GAS: 10.4 g/dL — ABNORMAL LOW
HEMOGLOBIN BLOOD GAS: 10.5 g/dL — ABNORMAL LOW
HEMOGLOBIN BLOOD GAS: 10.6 g/dL — ABNORMAL LOW
HEMOGLOBIN BLOOD GAS: 7.9 g/dL — ABNORMAL LOW
HEMOGLOBIN BLOOD GAS: 8.6 g/dL — ABNORMAL LOW
HEMOGLOBIN BLOOD GAS: 8.9 g/dL — ABNORMAL LOW
LACTATE BLOOD ARTERIAL: 0.7 mmol/L (ref <1.3)
LACTATE BLOOD ARTERIAL: 0.8 mmol/L (ref <1.3)
LACTATE BLOOD ARTERIAL: 1.1 mmol/L (ref <1.3)
LACTATE BLOOD ARTERIAL: 1.1 mmol/L (ref <1.3)
LACTATE BLOOD ARTERIAL: 2.2 mmol/L — ABNORMAL HIGH (ref <1.3)
LACTATE BLOOD ARTERIAL: 2.4 mmol/L — ABNORMAL HIGH (ref <1.3)
LACTATE BLOOD ARTERIAL: 3.1 mmol/L — ABNORMAL HIGH (ref <1.3)
O2 SATURATION ARTERIAL: 95.2 % (ref 94.0–100.0)
O2 SATURATION ARTERIAL: 95.7 % (ref 94.0–100.0)
O2 SATURATION ARTERIAL: 97.5 % (ref 94.0–100.0)
O2 SATURATION ARTERIAL: 97.8 % (ref 94.0–100.0)
O2 SATURATION ARTERIAL: 98 % (ref 94.0–100.0)
O2 SATURATION ARTERIAL: 98 % (ref 94.0–100.0)
O2 SATURATION ARTERIAL: 98.2 % (ref 94.0–100.0)
PCO2 ARTERIAL: 39.7 mmHg (ref 35.0–45.0)
PCO2 ARTERIAL: 40.8 mmHg (ref 35.0–45.0)
PCO2 ARTERIAL: 41.2 mmHg (ref 35.0–45.0)
PCO2 ARTERIAL: 47 mmHg — ABNORMAL HIGH (ref 35.0–45.0)
PCO2 ARTERIAL: 47.1 mmHg — ABNORMAL HIGH (ref 35.0–45.0)
PCO2 ARTERIAL: 50.5 mmHg — ABNORMAL HIGH (ref 35.0–45.0)
PCO2 ARTERIAL: 50.8 mmHg — ABNORMAL HIGH (ref 35.0–45.0)
PH ARTERIAL: 7.25 — ABNORMAL LOW (ref 7.35–7.45)
PH ARTERIAL: 7.26 — ABNORMAL LOW (ref 7.35–7.45)
PH ARTERIAL: 7.26 — ABNORMAL LOW (ref 7.35–7.45)
PH ARTERIAL: 7.26 — ABNORMAL LOW (ref 7.35–7.45)
PH ARTERIAL: 7.27 — ABNORMAL LOW (ref 7.35–7.45)
PH ARTERIAL: 7.28 — ABNORMAL LOW (ref 7.35–7.45)
PH ARTERIAL: 7.34 — ABNORMAL LOW (ref 7.35–7.45)
PO2 ARTERIAL: 115 mmHg — ABNORMAL HIGH (ref 80.0–110.0)
PO2 ARTERIAL: 120 mmHg — ABNORMAL HIGH (ref 80.0–110.0)
PO2 ARTERIAL: 124 mmHg — ABNORMAL HIGH (ref 80.0–110.0)
PO2 ARTERIAL: 127 mmHg — ABNORMAL HIGH (ref 80.0–110.0)
PO2 ARTERIAL: 144 mmHg — ABNORMAL HIGH (ref 80.0–110.0)
PO2 ARTERIAL: 87.7 mmHg (ref 80.0–110.0)
PO2 ARTERIAL: 90.1 mmHg (ref 80.0–110.0)
POTASSIUM WHOLE BLOOD: 4.1 mmol/L (ref 3.4–4.6)
POTASSIUM WHOLE BLOOD: 4.3 mmol/L (ref 3.4–4.6)
POTASSIUM WHOLE BLOOD: 4.8 mmol/L — ABNORMAL HIGH (ref 3.4–4.6)
POTASSIUM WHOLE BLOOD: 4.9 mmol/L — ABNORMAL HIGH (ref 3.4–4.6)
POTASSIUM WHOLE BLOOD: 5.1 mmol/L — ABNORMAL HIGH (ref 3.4–4.6)
POTASSIUM WHOLE BLOOD: 5.2 mmol/L — ABNORMAL HIGH (ref 3.4–4.6)
POTASSIUM WHOLE BLOOD: 5.8 mmol/L — ABNORMAL HIGH (ref 3.4–4.6)
SODIUM WHOLE BLOOD: 133 mmol/L — ABNORMAL LOW (ref 135–145)
SODIUM WHOLE BLOOD: 134 mmol/L — ABNORMAL LOW (ref 135–145)
SODIUM WHOLE BLOOD: 136 mmol/L (ref 135–145)
SODIUM WHOLE BLOOD: 138 mmol/L (ref 135–145)
SODIUM WHOLE BLOOD: 139 mmol/L (ref 135–145)
SODIUM WHOLE BLOOD: 140 mmol/L (ref 135–145)
SODIUM WHOLE BLOOD: 140 mmol/L (ref 135–145)

## 2022-03-21 LAB — BLOOD GAS CRITICAL CARE PANEL, VENOUS
BASE EXCESS VENOUS: -0.4 (ref -2.0–2.0)
BASE EXCESS VENOUS: 0.7 (ref -2.0–2.0)
BASE EXCESS VENOUS: 0.2 (ref -2.0–2.0)
CALCIUM IONIZED VENOUS (MG/DL): 5.41 mg/dL — ABNORMAL HIGH (ref 4.40–5.40)
CALCIUM IONIZED VENOUS (MG/DL): 5.18 mg/dL (ref 4.40–5.40)
CALCIUM IONIZED VENOUS (MG/DL): 5.03 mg/dL (ref 4.40–5.40)
GLUCOSE WHOLE BLOOD: 103 mg/dL (ref 70–179)
GLUCOSE WHOLE BLOOD: 90 mg/dL (ref 70–179)
GLUCOSE WHOLE BLOOD: 114 mg/dL (ref 70–179)
HCO3 VENOUS: 26 mmol/L (ref 22–27)
HCO3 VENOUS: 27 mmol/L (ref 22–27)
HCO3 VENOUS: 26 mmol/L (ref 22–27)
HEMOGLOBIN BLOOD GAS: 9.4 g/dL — ABNORMAL LOW
HEMOGLOBIN BLOOD GAS: 10 g/dL — ABNORMAL LOW
HEMOGLOBIN BLOOD GAS: 9.6 g/dL — ABNORMAL LOW
LACTATE BLOOD VENOUS: 0.9 mmol/L (ref 0.5–1.8)
LACTATE BLOOD VENOUS: 0.8 mmol/L (ref 0.5–1.8)
LACTATE BLOOD VENOUS: 0.9 mmol/L (ref 0.5–1.8)
O2 SATURATION VENOUS: 69 % (ref 40.0–85.0)
O2 SATURATION VENOUS: 63.1 % (ref 40.0–85.0)
O2 SATURATION VENOUS: 67.6 % (ref 40.0–85.0)
PCO2 VENOUS: 55 mmHg (ref 40–60)
PCO2 VENOUS: 56 mmHg (ref 40–60)
PCO2 VENOUS: 54 mmHg (ref 40–60)
PH VENOUS: 7.28 — ABNORMAL LOW (ref 7.32–7.43)
PH VENOUS: 7.3 — ABNORMAL LOW (ref 7.32–7.43)
PH VENOUS: 7.3 — ABNORMAL LOW (ref 7.32–7.43)
PO2 VENOUS: 40 mmHg (ref 30–55)
PO2 VENOUS: 36 mmHg (ref 30–55)
PO2 VENOUS: 39 mmHg (ref 30–55)
POTASSIUM WHOLE BLOOD: 4.7 mmol/L — ABNORMAL HIGH (ref 3.4–4.6)
POTASSIUM WHOLE BLOOD: 4.4 mmol/L (ref 3.4–4.6)
POTASSIUM WHOLE BLOOD: 4.3 mmol/L (ref 3.4–4.6)
SODIUM WHOLE BLOOD: 137 mmol/L (ref 135–145)
SODIUM WHOLE BLOOD: 135 mmol/L (ref 135–145)
SODIUM WHOLE BLOOD: 135 mmol/L (ref 135–145)

## 2022-03-21 LAB — ENDOTOOL
ENDOTOOL GLUCOSE: 191 mg/dL — ABNORMAL HIGH (ref 110–140)
ENDOTOOL GLUCOSE: 146 mg/dL — ABNORMAL HIGH (ref 110–140)
ENDOTOOL GLUCOSE: 119 mg/dL (ref 110–140)
ENDOTOOL GLUCOSE: 119 mg/dL (ref 110–140)
ENDOTOOL GLUCOSE: 110 mg/dL (ref 110–140)
ENDOTOOL GLUCOSE: 105 mg/dL — ABNORMAL LOW (ref 110–140)
ENDOTOOL GLUCOSE: 132 mg/dL (ref 110–140)
ENDOTOOL GLUCOSE: 139 mg/dL (ref 110–140)
ENDOTOOL GLUCOSE: 101 mg/dL — ABNORMAL LOW (ref 110–140)
ENDOTOOL GLUCOSE: 104 mg/dL — ABNORMAL LOW (ref 110–140)
ENDOTOOL GLUCOSE: 87 mg/dL — ABNORMAL LOW (ref 110–140)
ENDOTOOL GLUCOSE: 115 mg/dL (ref 110–140)
ENDOTOOL INSULIN RATE: 7 U/h
ENDOTOOL INSULIN RATE: 3.4 U/h
ENDOTOOL INSULIN RATE: 2 U/h
ENDOTOOL INSULIN RATE: 2.4 U/h
ENDOTOOL INSULIN RATE: 1.4 U/h
ENDOTOOL INSULIN RATE: 1.6 U/h
ENDOTOOL INSULIN RATE: 3 U/h
ENDOTOOL INSULIN RATE: 3.6 U/h
ENDOTOOL INSULIN RATE: 0.7 U/h
ENDOTOOL INSULIN RATE: 1.5 U/h
ENDOTOOL INSULIN RATE: 0 U/h
ENDOTOOL INSULIN RATE: 1.4 U/h
RECOVERY CARBS: 9.5 g

## 2022-03-21 LAB — BASIC METABOLIC PANEL
ANION GAP: 7 mmol/L (ref 5–14)
ANION GAP: 4 mmol/L — ABNORMAL LOW (ref 5–14)
BLOOD UREA NITROGEN: 15 mg/dL (ref 9–23)
BLOOD UREA NITROGEN: 17 mg/dL (ref 9–23)
BUN / CREAT RATIO: 15
BUN / CREAT RATIO: 16
CALCIUM: 8.8 mg/dL (ref 8.7–10.4)
CALCIUM: 9.2 mg/dL (ref 8.7–10.4)
CHLORIDE: 109 mmol/L — ABNORMAL HIGH (ref 98–107)
CHLORIDE: 109 mmol/L — ABNORMAL HIGH (ref 98–107)
CO2: 22 mmol/L (ref 20.0–31.0)
CO2: 24 mmol/L (ref 20.0–31.0)
CREATININE: 0.97 mg/dL
CREATININE: 1.05 mg/dL
EGFR CKD-EPI (2021) MALE: 82 mL/min/{1.73_m2} (ref >=60)
EGFR CKD-EPI (2021) MALE: 75 mL/min/{1.73_m2} (ref >=60)
GLUCOSE RANDOM: 152 mg/dL — ABNORMAL HIGH (ref 70–99)
GLUCOSE RANDOM: 164 mg/dL (ref 70–179)
POTASSIUM: 5.2 mmol/L — ABNORMAL HIGH (ref 3.4–4.8)
POTASSIUM: 4.9 mmol/L — ABNORMAL HIGH (ref 3.4–4.8)
SODIUM: 138 mmol/L (ref 135–145)
SODIUM: 137 mmol/L (ref 135–145)

## 2022-03-21 LAB — CBC
HEMATOCRIT: 31.1 % — ABNORMAL LOW (ref 39.0–48.0)
HEMOGLOBIN: 10.4 g/dL — ABNORMAL LOW (ref 12.9–16.5)
MEAN CORPUSCULAR HEMOGLOBIN CONC: 33.5 g/dL (ref 32.0–36.0)
MEAN CORPUSCULAR HEMOGLOBIN: 31.5 pg (ref 25.9–32.4)
MEAN CORPUSCULAR VOLUME: 93.9 fL (ref 77.6–95.7)
MEAN PLATELET VOLUME: 7.9 fL (ref 6.8–10.7)
PLATELET COUNT: 128 10*9/L — ABNORMAL LOW (ref 150–450)
RED BLOOD CELL COUNT: 3.31 10*12/L — ABNORMAL LOW (ref 4.26–5.60)
RED CELL DISTRIBUTION WIDTH: 13.6 % (ref 12.2–15.2)
WBC ADJUSTED: 9.8 10*9/L (ref 3.6–11.2)

## 2022-03-21 LAB — POTASSIUM: POTASSIUM: 5.7 mmol/L — ABNORMAL HIGH (ref 3.4–4.8)

## 2022-03-21 LAB — MAGNESIUM
MAGNESIUM: 2.6 mg/dL (ref 1.6–2.6)
MAGNESIUM: 2.1 mg/dL (ref 1.6–2.6)

## 2022-03-21 LAB — PHOSPHORUS: PHOSPHORUS: 2.8 mg/dL (ref 2.4–5.1)

## 2022-03-21 LAB — O2 SATURATION VENOUS: O2 SATURATION VENOUS: 64.7 % (ref 40.0–85.0)

## 2022-03-21 LAB — PROTIME-INR
INR: 1.09
PROTIME: 12.2 s (ref 9.9–12.6)

## 2022-03-21 MED ADMIN — magnesium sulfate in water 2 gram/50 mL (4 %) IVPB 2 g: 2 g | INTRAVENOUS | @ 03:00:00

## 2022-03-21 MED ADMIN — albumin human 5 % 5 % bottle 12.5 g: 12.5 g | INTRAVENOUS | @ 11:00:00 | Stop: 2022-03-21

## 2022-03-21 MED ADMIN — potassium chloride (KLOR-CON) packet 20 mEq: 20 meq | GASTROENTERAL | @ 01:00:00

## 2022-03-21 MED ADMIN — magnesium sulfate in water 2 gram/50 mL (4 %) IVPB 2 g: 2 g | INTRAVENOUS | @ 05:00:00

## 2022-03-21 MED ADMIN — lactated ringers bolus 500 mL: 500 mL | INTRAVENOUS | @ 04:00:00 | Stop: 2022-03-20

## 2022-03-21 MED ADMIN — insulin regular (HumuLIN,NovoLIN) injection 10 Units: 10 [IU] | INTRAVENOUS | @ 03:00:00 | Stop: 2022-03-20

## 2022-03-21 MED ADMIN — oxyCODONE (ROXICODONE) immediate release tablet 10 mg: 10 mg | GASTROENTERAL | @ 01:00:00 | Stop: 2022-04-03

## 2022-03-21 MED ADMIN — heparin (porcine) 5,000 unit/mL injection 5,000 Units: 5000 [IU] | SUBCUTANEOUS | @ 21:00:00

## 2022-03-21 MED ADMIN — docusate sodium (COLACE) capsule 100 mg: 100 mg | ORAL | @ 13:00:00

## 2022-03-21 MED ADMIN — lidocaine 4 % patch 3 patch: 3 | TRANSDERMAL | @ 13:00:00

## 2022-03-21 MED ADMIN — aspirin tablet 325 mg: 325 mg | ORAL | @ 14:00:00 | Stop: 2022-03-21

## 2022-03-21 MED ADMIN — heparin (porcine) 5,000 unit/mL injection 5,000 Units: 5000 [IU] | SUBCUTANEOUS | @ 14:00:00

## 2022-03-21 MED ADMIN — calcium chloride 100 mg/mL (10 %) syringe 1 g: 1 g | INTRAVENOUS | @ 13:00:00

## 2022-03-21 MED ADMIN — mupirocin (BACTROBAN) 2 % ointment: NASAL | @ 02:00:00 | Stop: 2022-03-25

## 2022-03-21 MED ADMIN — lactated ringers bolus 250 mL: 250 mL | INTRAVENOUS | @ 13:00:00 | Stop: 2022-03-21

## 2022-03-21 MED ADMIN — colchicine (COLCRYS) tablet 0.6 mg: .6 mg | ORAL | @ 13:00:00 | Stop: 2022-03-25

## 2022-03-21 MED ADMIN — famotidine (PF) (PEPCID) injection 20 mg: 20 mg | INTRAVENOUS | @ 01:00:00

## 2022-03-21 MED ADMIN — insulin regular 100 unit/100 mL (1 unit/mL) in sodium chloride 0.9% 100 mL: 0-50 [IU]/h | INTRAVENOUS | @ 07:00:00 | Stop: 2022-03-21

## 2022-03-21 MED ADMIN — NORepinephrine 8 mg in dextrose 5 % 250 mL (32 mcg/mL) infusion PMB: INTRAVENOUS | @ 04:00:00

## 2022-03-21 MED ADMIN — cefuroxime (ZINACEF) 1.5 g in sodium chloride 0.9 % (NS) 100 mL IVPB-MBP: 1.5 g | INTRAVENOUS | @ 04:00:00 | Stop: 2022-03-21

## 2022-03-21 MED ADMIN — oxyCODONE (ROXICODONE) immediate release tablet 10 mg: 10 mg | GASTROENTERAL | @ 08:00:00 | Stop: 2022-03-21

## 2022-03-21 MED ADMIN — fentaNYL (PF) (SUBLIMAZE) injection 50 mcg: 50 ug | INTRAVENOUS | @ 04:00:00 | Stop: 2022-04-03

## 2022-03-21 MED ADMIN — cefuroxime (ZINACEF) 1.5 g in sodium chloride 0.9 % (NS) 100 mL IVPB-MBP: 1.5 g | INTRAVENOUS | @ 13:00:00 | Stop: 2022-03-21

## 2022-03-21 MED ADMIN — polyethylene glycol (MIRALAX) packet 17 g: 17 g | ORAL | @ 16:00:00

## 2022-03-21 MED ADMIN — acetaminophen (TYLENOL) tablet 1,000 mg: 1000 mg | GASTROENTERAL | @ 13:00:00 | Stop: 2022-03-21

## 2022-03-21 MED ADMIN — fentaNYL (PF) (SUBLIMAZE) injection 25 mcg: 25 ug | INTRAVENOUS | @ 16:00:00 | Stop: 2022-04-03

## 2022-03-21 MED ADMIN — sertraline (ZOLOFT) tablet 100 mg: 100 mg | ORAL | @ 13:00:00

## 2022-03-21 MED ADMIN — ondansetron (ZOFRAN) injection 4 mg: 4 mg | INTRAVENOUS | @ 02:00:00

## 2022-03-21 MED ADMIN — magnesium oxide (MAG-OX) tablet 400 mg: 400 mg | ORAL | @ 13:00:00

## 2022-03-21 MED ADMIN — potassium chloride 20 mEq in 100 mL IVPB Premix: 20 meq | INTRAVENOUS | @ 01:00:00 | Stop: 2022-03-27

## 2022-03-21 MED ADMIN — polyethylene glycol (MIRALAX) packet 17 g: 17 g | ORAL | @ 13:00:00

## 2022-03-21 MED ADMIN — fentaNYL (PF) (SUBLIMAZE) injection 50 mcg: 50 ug | INTRAVENOUS | @ 10:00:00 | Stop: 2022-03-21

## 2022-03-21 MED ADMIN — docusate (COLACE) oral liquid: 100 mg | GASTROENTERAL | @ 01:00:00

## 2022-03-21 MED ADMIN — dextrose (D10W) 10% bolus 250 mL: 25 g | INTRAVENOUS | @ 03:00:00 | Stop: 2022-03-20

## 2022-03-21 MED ADMIN — fentaNYL (PF) (SUBLIMAZE) injection 50 mcg: 50 ug | INTRAVENOUS | @ 06:00:00 | Stop: 2022-03-21

## 2022-03-21 MED ADMIN — acetaminophen (TYLENOL) tablet 1,000 mg: 1000 mg | ORAL | @ 22:00:00

## 2022-03-21 MED ADMIN — mupirocin (BACTROBAN) 2 % ointment: NASAL | @ 13:00:00 | Stop: 2022-03-25

## 2022-03-21 MED ADMIN — naloxegoL (MOVANTIK) tablet 25 mg: 25 mg | ORAL | @ 13:00:00 | Stop: 2022-03-26

## 2022-03-21 MED ADMIN — albumin human 5 % 5 % bottle: @ 10:00:00 | Stop: 2022-03-21

## 2022-03-21 MED ADMIN — acetaminophen (TYLENOL) tablet 650 mg: 650 mg | GASTROENTERAL | @ 01:00:00

## 2022-03-21 MED ADMIN — polyethylene glycol (MIRALAX) packet 17 g: 17 g | ORAL | @ 22:00:00

## 2022-03-21 MED ADMIN — melatonin tablet 6 mg: 6 mg | ORAL | @ 22:00:00

## 2022-03-21 MED ADMIN — pravastatin (PRAVACHOL) tablet 40 mg: 40 mg | ORAL | @ 14:00:00

## 2022-03-21 MED ADMIN — gabapentin (NEURONTIN) capsule 300 mg: 300 mg | ORAL | @ 16:00:00

## 2022-03-21 NOTE — Unmapped (Signed)
CVT ICU ADDITIONAL CRITICAL CARE NOTE     Date of Service: 03/20/2022    Hospital Day: LOS: 0 days        Surgery Date: 11/27  Surgical Attending: Arlester Marker, MD    Critical Care Attending: Gordy Clement, MD    Interval History:   Respiratory acidosis on SBT, remains intubated for now.  Weaning pressors as tolerated, given 500 LR.     History of Present Illness:   Christopher [name] is a 71 y.o. male w/ angina, CAD (LAD and OM), HLD, T2DM (HgbA1C 7.9), HTN, statin intolerance, TIA     Hospital Course:  11/27 CABG x2 (LIMA to LAD, RSVG to OM), no TEE due to esophageal stricture     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 *     ASSESSMENT & PLAN:     Cardiovascular: Postoperative state from CABG  - No TEE done 2/2 esophageal stricture. Normal EF on TTE.  - Intraoperative blood products: 600 mL cell saver returned, 340 autologous blood returned  - Goal MAP 65-41mmHg, CI >2.2, SvO2 60-80  - Mediastinal tubes to -20 cm suction, monitor output  - Epicardial pacing wires: A's only    Respiratory: Post-operative mechanical ventilation  - Goal SpO2 >92%, wean oxygen as tolerated  - Aggressive pulmonary hygiene, encourage IS  - Pleural drains to -20cm suction, monitor output    Neurologic: Post-operative pain  - Pain: Fentanyl PRN, oxy PRN  - Sedation: Prop  - Frailty score:     - Frail (3-5)   - Pre-frail (1-2)   - Robust (0)    Renal/Genitourinary:  NAI  - Replete electrolytes PRN  - Strict I&O    Gastrointestinal: Gastrointestinal bleed  - Diet: NPO while intubated, ADAT once extubated  - GI prophylaxis: Pepcid  - Bowel regimen: miralax, colace    Endocrine:  T2DM  - Glycemic Control: Endotool    Hematologic: Acute blood loss anemia secondary to cardiac surgery    - Monitor CBC daily, transfuse products as indicated  - Monitor chest tube output    Immunologic/Infectious Disease:  - Afebrile, leukocytosis likely secondary to post-operative inflammatory state, continue to monitor    Integumentary  - Skin bundle discussed on AM rounds? Yes  - Pressure injury present on admission to CVTICU? No  - Is CWOCN consulted? No  - Are any CWOCN verified pressure injuries present since admission to CVTICU? No    Mobility   Mobility level 1. HOB elevated to 30* for 1 hour (2x per shift)   PT/OT consulted Yes.    Daily Care Checklist:   - Stress Ulcer Prevention: Yes: Mechanically ventilated  - DVT Prophylaxis: Mechanical: Yes.  - HOB >30 degrees: Yes   - Daily Awakening: Yes  - Spontaneous Breathing Trial: Yes  - Indication for Beta Blockade: No  - Indication for  Central/PICC Line: Yes  Infusions requiring central access, Hemodynamic monitoring, and Aggressive fluid resuscitation  - Indication for Urinary Catheter: Yes  Strict intake and output, Critically ill, and Agressive diuresis/hydration  - Diagnostic images/reports of past 24hrs reviewed: Yes    Disposition:   - Continue ICU care    [name] is critically ill due to: Postoperative state from CABG, lactic acidosis, postoperative mechanical ventilation, hypotension, surgical pain  This critical care time includes examining the patient, evaluating the hemodynamic, laboratory, and radiographic data, independently developing a comprehensive management plan, and serially assessing the patient's response to these critical care interventions. This critical care time excludes procedures.    Critical care time: 50 minutes     Clydell Hakim, PA   Cardiovascular and Thoracic Intensive Care Unit  Department of Anesthesiology  [place] [facility] [place] [facility]     SUBJECTIVE:      Lightly sedated, in NAD.      OBJECTIVE:     Physical Exam:  Constitutional: M appearing stated age, NAD  Neurologic: lightly sedated, tracking following commands, no focal deficits  Respiratory: ETT in place, equal chest movement bilaterally  Cardiovascular: RRR, minimal edema  Gastrointestinal:  Flat, NTND  Musculoskeletal: Sedated, age appropriate bulk/tone  Skin: Warm, dry  Sternotomy incision: Primary OR dressing c/d/i    Temp:  [36 ??C (96.8 ??F)] 36 ??C (96.8 ??F)  Core Temp:  [35.9 ??C (96.6 ??F)-38.1 ??C (100.6 ??F)] 38.1 ??C (100.6 ??F)  Heart Rate:  [68-89] 79  SpO2 Pulse:  [78-89] 79  Resp:  [0-24] 6  A BP-1: (92-138)/(50-85) 138/70  MAP:  [63 mmHg-95 mmHg] 95 mmHg  FiO2 (%):  [40 %-70 %] 50 %  SpO2:  [92 %-98 %] 97 %  CVP:  [0 mmHg-19 mmHg] 12 mmHg    Recent Laboratory Results:  Recent Labs   Lab Units 03/20/22  1847   PH ART  7.32*   PCO2 ART mm Hg 44.7   PO2 ART mm Hg 137.0*   HCO3 ART mmol/L 22   BASE EXC ART  -2.7*   O2 SAT ART % 98.2     Recent Labs   Lab Units 03/20/22  1847 03/20/22  1733 03/20/22  1643 03/20/22  1454 03/20/22  1436 03/20/22  1340   SODIUM WHOLE BLOOD mmol/L 138 139 138   < >  --  136   SODIUM mmol/L  --   --   --   --  143 140   POTASSIUM WHOLE BLOOD mmol/L 5.6* 4.8* 4.5   < >  --  2.9*   POTASSIUM mmol/L  --   --   --   --  3.1* 3.1* - 3.1*   CHLORIDE mmol/L  --   --   --   --  110* 107   CO2 mmol/L  --   --   --   --  20.0 20.0   BUN mg/dL  --   --   --   --  13 14   CREATININE mg/dL  --   --   --   --  1.61 0.83   GLUCOSE mg/dL  --   --   --   --  096 172*    < > = values in this interval not displayed.     Lab Results   Component Value Date    BILITOT 0.6 03/20/2022    BILITOT 0.4 03/01/2022    BILIDIR 0.20 03/20/2022  ALT 12 03/20/2022 ALT 14 03/01/2022    AST 31 03/20/2022    AST 21 03/01/2022    ALKPHOS 41 (L) 03/20/2022    ALKPHOS 61 03/01/2022    PROT 5.8 03/20/2022    PROT 7.7 03/01/2022    ALBUMIN 3.5 03/20/2022    ALBUMIN 4.1 03/01/2022     Recent Labs   Lab Units 03/20/22  0624   POC GLUCOSE mg/dL 045     Recent Labs   Lab Units 03/20/22  1733 03/20/22  1340 03/20/22  1206   WBC 10*9/L  --  15.5*  --    RBC 10*12/L  --  3.74*  --    HEMOGLOBIN g/dL  --  40.9*  --    HEMOGLOBIN BG g/dL 81.1*  --  9.3*   HEMATOCRIT %  --  34.4*  --    MCV fL  --  92.0  --    MCH pg  --  30.9  --    MCHC g/dL  --  91.4  --    RDW %  --  13.5  --    PLATELET COUNT (1) 10*9/L  --  146*  --    MPV fL  --  7.7  --      Recent Labs   Lab Units 03/20/22  1340   INR  1.18   APTT sec 26.8      Lines & Tubes:   Patient Lines/Drains/Airways Status       Active Peripheral & Central Intravenous Access       Name Placement date Placement time Site Days    Peripheral IV 03/20/22 Left Hand 03/20/22  0710  Hand  less than 1    CVC Triple Lumen 03/20/22 Right Internal jugular 03/20/22  1300  Internal jugular  less than 1    CVC MAC Introducer 03/20/22 Right Internal jugular 03/20/22  1300  Internal jugular  less than 1                  Urethral Catheter Non-latex;Straight-tip;Temperature probe 16 Fr. (Active)   Output (mL) 150 mL 03/20/22 1335   Number of days: 0     Patient Lines/Drains/Airways Status       Active Wounds       Name Placement date Placement time Site Days    Surgical Site 03/20/22 Chest Mid 03/20/22  1000  -- less than 1    Surgical Site 03/20/22 Leg Right 03/20/22  1233  -- less than 1                     Respiratory/ventilator settings for last 24 hours:   Vent Mode: PRVC  FiO2 (%): 50 %  S RR: 18  S VT: 450 mL  PEEP: 8 cm H20    Intake/Output last 3 shifts:  I/O last 3 completed shifts:  In: 4295.1 [I.V.:2911.8; Blood:940; IV Piggyback:443.3]  Out: 1685 [Urine:1160; Blood:200; Chest Tube:325]    Daily/Recent Weight:  84.1 kg (185 lb 6.5 oz)    BMI:  Body mass index is 29.94 kg/m??.                                  Medical History:  Past Medical History:   Diagnosis Date    Acute kidney injury (CMS-HCC) 01/12/2018    Anemia     IRON DEFICIENT     Anxiety dont know  Arthritis     Basal cell carcinoma     Cataract     CHF (congestive heart failure) (CMS-HCC)     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    Corneal abrasion 1975    steel in right eye with abrasion and removal    Cubital tunnel syndrome on left 01/16/2019    Depression 2016    Diabetes mellitus (CMS-HCC)     GERD (gastroesophageal reflux disease) 1982    Hypertension     Meningitis 12/25/2015    Mixed hyperlipidemia     Myocardial infarction (CMS-HCC) 1986    Obesity 1986    first back surgery    Peptic ulceration      Past Surgical History:   Procedure Laterality Date    BACK SURGERY      CARDIAC CATHETERIZATION      STENTS PLACEMENT     CARDIAC SURGERY  2005    stents    CORONARY STENT PLACEMENT      FOREIGN BODY REMOVAL Right about 20 years ago    HERNIA REPAIR      RIGHT INGUNIAL    JOINT REPLACEMENT Left     TOTAL SHOULDER    PR ARM/ELBOW TENDON LENGTHEN,SINGLE,EA Left 02/17/2019    Procedure: TENDON LENGTHENING UPPER ARM/ELBOW SNGL EA;  Surgeon: Daisy Lazar, MD;  [facility]: [facility];  Service: Orthopedics    PR CATH PLACE/CORON ANGIO, IMG SUPER/INTERP,W LEFT HEART VENTRICULOGRAPHY N/A 02/01/2022    Procedure: Left Heart Catheterization;  Surgeon: Jacquelyne Balint, MD;  Location: [facility] CATH;  Service: Cardiology    PR OSTEOTOMY 1ST METATARSAL,BASE/SHAFT Left 07/20/2017    Procedure: OSTEOTOMY, W/WO LENGTHENING, SHORTENING OR ANGULAR CORRECTION, METATARSAL; FIRST METATARSAL;  Surgeon: Valda Favia, MD;  Location: [facility] OR [facility];  Service: Ortho Foot & Ankle    PR OSTEOTOMY HEEL BONE Left 07/20/2017    Procedure: OSTEOTOMYLoistine Chance W/WO INT FIXA;  Surgeon: Valda Favia, MD;  Location: [facility] OR [facility];  Service: Ortho Foot & Ankle    PR OSTEOTOMY METATARSAL (NOT 1ST) Left 06/06/2018    Procedure: OSTEOTOMY, W/WO LENGTHENING, SHORTENING OR ANGULAR CORRECTION, METATARSAL; OTHER THAN FIRST METATARSAL, EA;  Surgeon: Valda Favia, MD;  Location: [facility] OR [facility];  Service: Orthopedics    PR PART REMV PHALANX OF TOE Left 06/06/2018    Procedure: PART EXC BONE PHALANX TOE;  Surgeon: Valda Favia, MD;  Location: [facility] OR [facility];  Service: Orthopedics    PR RECONSTR TOTAL SHOULDER IMPLANT Left 11/12/2018    Procedure: R28   ARTHROPLASTY, GLENOHUMERAL JOINT; TOTAL SHOULDER(GLENOID & PROXIMAL HUMERAL REPLACEMENT(EG, TOTAL SHOULDER);  Surgeon: Nickola Major, MD;  Location: [facility] OR [facility];  Service: Orthopedics    PR REMOVAL DEEP IMPLANT Left 11/12/2018    Procedure: REMOVE IMPLANT; DEEP SHOULDER;  Surgeon: Nickola Major, MD;  Location: [facility] OR [facility];  Service: Orthopedics    PR REPAIR BICEPS LONG TENDON Left 11/12/2018    Procedure: TENODESIS LONG TENDON BICEPS;  Surgeon: Nickola Major, MD;  Location: [facility] OR [facility];  Service: Orthopedics    PR REPAIR PERONEAL TENDONS,FIB OSTEOTMY Left 07/20/2017    Procedure: REPR DISLOC PERONEAL TENDONS; W/FIB OSTEOTOMY;  Surgeon: Valda Favia, MD;  [facility]: [facility] OR [facility];  Service: Ortho Foot & Ankle    PR REVISE MEDIAN N/CARPAL TUNNEL SURG Left 02/17/2019    Procedure: R22 NEUROPLASTY AND/OR TRANSPOSITION; MEDIAN NERVE AT CARPAL TUNNEL;  Surgeon: Daisy Lazar, MD;  Location: ASC  OR ;  Service: Orthopedics    PR REVISE ULNAR NERVE AT ELBOW Left 02/17/2019    Procedure: NEUROPLASTY AND/OR TRANSPOSITION; ULNAR NERVE AT ELBOW;  Surgeon: Theodora Blow Jacqlyn Krauss, MD;  Location: [facility] OR [facility];  Service: Orthopedics    SHOULDER SURGERY Bilateral     L TSA, R ROTATOR CUFF      SKIN BIOPSY      SPINE SURGERY  1986/1995/2000/2002    LUMBAR FUSION    stent       Scheduled Medications:   cefuroxime  1.5 g Intravenous Q12H    colchicine  0.6 mg Enteral tube: gastric BID docusate  100 mg Enteral tube: gastric BID    famotidine (PEPCID) IV  20 mg Intravenous BID    Or    famotidine  20 mg Enteral tube: gastric BID    magnesium oxide  400 mg Enteral tube: gastric BID    melatonin  6 mg Enteral tube: gastric QPM    mupirocin   Each Nare BID    naloxegoL  25 mg Enteral tube: gastric Daily    polyethylene glycol  17 g Enteral tube: gastric 3xd Meals    potassium chloride  20 mEq Enteral tube: gastric BID     Continuous Infusions:   insulin regular Stopped (03/20/22 1653)    lactated Ringers Stopped (03/20/22 0837)    NORepinephrine bitartrate-NS Stopped (03/20/22 1806)    propofol 10 mg/mL infusion Stopped (03/20/22 1515)    sodium chloride       PRN Medications:  acetaminophen, calcium chloride **AND** Notify Provider **AND** Obtain lab:   CALCIUM, IONIZED, dextrose in water, fentaNYL (PF), ipratropium-albuteroL, magnesium sulfate in water **AND** Notify Provider **AND** Notify Provider **AND** Obtain lab:   MAGNESIUM, SERUM, ondansetron, oxyCODONE, oxyCODONE, potassium chloride in water **OR** potassium chloride in water

## 2022-03-21 NOTE — Unmapped (Signed)
Endocrine Team Diabetes New Consult Note     Consult information:  Requesting Attending Physician : Arlester Marker, MD  Service Requesting Consult : Surg Cardiac [facility])  Primary Care Provider: Amada Kingfisher, MD  Impression:  Christopher [name] is a 71 y.o. male admitted for 2v CABG (LIMA to LAD, RSVG to OM), now s/p 11/27. We have been consulted at the request of Arlester Marker, MD to evaluate Christopher [name] for hyperglycemia.     Medical Decision Making:  Diagnoses:  1.Type 2 Diabetes. Uncontrolled With severe hyperglycemia last 24 hours.  2. Nutrition: Complicating glycemic control. Increasing risk for both hypoglycemia and hyperglycemia.  3. Coronary Artery Disease. Complicating glycemic control and increasing risk for hyperglycemia and hypoglycemia.  4. Obesity. Complicating glycemic control and increasing risk for hyperglycemia.  5. Chronic Kidney Disease. Complicating glycemic control and increasing risk for hypoglycemia.  6. Catecholamine infusion. Complicating glycemic control and increasing risk for hyperglycemia.      Studies reviewed 03/21/22:  Labs: CBC, BMP, POCT-BG, and HbA1C  Interpretation: No leukocytosis, anemia present. Hyperkalemia improving, otherwise electrolytes normal. 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 Overall intermittent hyperglycemia with severe, ranging 110-332 mg/dL  in the past 24 hours. A1C 7.9  (12/22/21) indicating fair outpatient glycemic management.  Notes reviewed: Primary team, nursing notes, and cardiac surgery      Overall impression based on above reviews and history:  Pt with T2DM s/p 2vCABG 11/27. Given recent CABG, variable and high insulin requirements via IV insulin infusion, and catecholamine infusion would recommend continuing endotool at this time.    Ensure endotool instructions are followed precisely. IV insulin has a half life of 5 minutes meaning that infusion rate needs to be adjusted frequently based on a current BG (usually every hour).  Leaving insulin infusion at a specified rate for more than one hour without reevaluating BG places patient at high risk for severe hypoglycemia or hyperglycemia.       Recommendations:  - IV insulin infusion via endotool   - BG target range: 110-140  - Hypoglycemia protocol.  - POCT-BG per endotool.  - Ensure patient is on glucose precautions if patient taking nutrition by mouth.     Discharge planning:  In process. Will complete closer to discharge.    Thank you for this consult. Discussed plan with primary team. We will continue to follow and make recommendations and place orders as appropriate.    Please page with questions or concerns: Karna Christmas, NP: [phone]  Endocrinology Diabetes Care Team on call from 6AM - 3PM on weekdays then endocrine fellow on call: [phone] from 3PM - 6AM on weekdays and on weekends and holidays.   If APP cannot be reached, please page the endocrine fellow on call.      Subjective:  Initial HPI:  Christopher [name] is a 71 y.o. male with past medical history of accelerating angina, native coronary artery disease (LAD and OM), HLD, T2DM (HgbA1C 7.9), HTN, statin intolerance, TIA who presents for evaluation for Coronary Artery Bypass Grafting. Mr. Harvel Quale reports that he has CP/Angina every day, even at rest. However, this is worsened by exertion. He denies other associated symptoms.    He is now s/p CABGx2 (LIMA to LAD, RSVG to OM) 11/27       Diabetes History:  Patient has a history of Type 2 diabetes diagnosed about 10 years ago.  Diabetes is managed by: PCP.  Current home diabetes regimen: metformin  1000 mg BID AC.  Current home blood glucose monitoring: daily.  Hypoglycemia awareness: Aware.  Complications related to diabetes: retinopathy and chronic kidney disease      Current Nutrition:  Active Orders   There are no active orders of the following types: Diet.       ROS: As per HPI.     acetaminophen  1,000 mg Enteral tube: gastric QID cefuroxime  1.5 g Intravenous Q12H    colchicine  0.6 mg Oral BID    docusate sodium  100 mg Oral BID    lactated ringers  250 mL Intravenous Once    magnesium oxide  400 mg Oral BID    melatonin  6 mg Oral QPM    mupirocin   Each Nare BID    naloxegoL  25 mg Oral Daily    polyethylene glycol  17 g Oral 3xd Meals       Current Outpatient Medications   Medication Instructions    alirocumab 75 mg/mL PnIj Inject the contents of 1 pen (75 mg) under the skin every fourteen (14) days.    amlodipine (NORVASC) 10 mg, Oral, Daily (standard), For high blood pressure    aspirin (ECOTRIN) 81 mg, Oral, Daily (standard)    blood sugar diagnostic (ONETOUCH ULTRA TEST) Strp USE ONCE DAILY TO CHECK FASTING BLOOD GLUCOSE    blood-glucose meter Misc 1 Device, Intradermal, Daily (standard), DX: E11.8    clopidogreL (PLAVIX) 75 mg, Oral, Daily (standard)    diclofenac sodium (VOLTAREN) 1 % gel APPLY 2 GRAMS TO AFFECTED  AREA(S) TOPICALLY 4 TIMES DAILY    diclofenac sodium (VOLTAREN) 2 g, Topical, 4 times a day    empty container (SHARPS-A-GATOR DISPOSAL SYSTEM) Misc Use as directed for sharps disposal    fluticasone propionate (FLONASE) 50 mcg/actuation nasal spray 2 sprays, Each Nare, Daily (standard)    gabapentin (NEURONTIN) 300 MG capsule TAKE 1 CAPSULE BY MOUTH TWICE  DAILY    HYDROcodone-acetaminophen (NORCO) 7.5-325 mg per tablet 1 tablet, Oral, Every 6 hours PRN, Fill date: 08/22/2021    HYDROcodone-acetaminophen (NORCO) 7.5-325 mg per tablet 1 tablet, Oral, Every 6 hours PRN, Fill date: 09/19/2021    HYDROcodone-acetaminophen (NORCO) 7.5-325 mg per tablet 1 tablet, Oral, Every 6 hours PRN, Fill date: 12/22/2021    isosorbide mononitrate (IMDUR) 30 mg, Oral, 2 times a day    metFORMIN (GLUCOPHAGE) 1,000 mg, Oral, 2 times a day with meals    metoPROLOL succinate (TOPROL-XL) 100 mg, Oral, Daily (standard)    mupirocin (BACTROBAN) 2 % ointment Apply to both nostrils with a Q-tip the night before and morning of surgery.    nitroglycerin (NITROSTAT) 0.4 mg, Sublingual, Every 5 min PRN    omeprazole (PRILOSEC) 20 mg, Oral, Daily (standard)    pravastatin (PRAVACHOL) 10 mg, Oral, Daily (standard)    sertraline (ZOLOFT) 100 mg, Oral, Daily (standard)    tamsulosin (FLOMAX) 0.4 mg, Oral, Daily (standard)    traZODone (DESYREL) 150 mg, Oral, Nightly    trospium (SANCTURA) 20 mg, Oral, Nightly           Past Medical History:   Diagnosis Date    Acute kidney injury (CMS-HCC) 01/12/2018    Anemia     IRON DEFICIENT     Anxiety dont know    Arthritis     Basal cell carcinoma     Cataract     CHF (congestive heart failure) (CMS-HCC)     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    Corneal  abrasion 1975    steel in right eye with abrasion and removal    Cubital tunnel syndrome on left 01/16/2019    Depression 2016    Diabetes mellitus (CMS-HCC)     GERD (gastroesophageal reflux disease) 1982    Hypertension     Meningitis 12/25/2015    Mixed hyperlipidemia     Myocardial infarction (CMS-HCC) 1986    Obesity 1986    first back surgery    Peptic ulceration        Past Surgical History:   Procedure Laterality Date    BACK SURGERY      CARDIAC CATHETERIZATION      STENTS PLACEMENT     CARDIAC SURGERY  2005    stents    CORONARY STENT PLACEMENT      FOREIGN BODY REMOVAL Right about 20 years ago    HERNIA REPAIR      RIGHT INGUNIAL    JOINT REPLACEMENT Left     TOTAL SHOULDER    PR ARM/ELBOW TENDON LENGTHEN,SINGLE,EA Left 02/17/2019    Procedure: TENDON LENGTHENING UPPER ARM/ELBOW SNGL EA;  Surgeon: Daisy Lazar, MD;  [facility]: [facility];  Service: Orthopedics    PR CATH PLACE/CORON ANGIO, IMG SUPER/INTERP,W LEFT HEART VENTRICULOGRAPHY N/A 02/01/2022    Procedure: Left Heart Catheterization;  Surgeon: Jacquelyne Balint, MD;  Location: [facility] CATH;  Service: Cardiology    PR OSTEOTOMY 1ST METATARSAL,BASE/SHAFT Left 07/20/2017    Procedure: OSTEOTOMY, W/WO LENGTHENING, SHORTENING OR ANGULAR CORRECTION, METATARSAL; FIRST METATARSAL;  Surgeon: Valda Favia, MD;  Location: [facility] OR [facility];  Service: Ortho Foot & Ankle    PR OSTEOTOMY HEEL BONE Left 07/20/2017    Procedure: OSTEOTOMYLoistine Chance W/WO INT FIXA;  Surgeon: Valda Favia, MD;  Location: [facility];  Service: Ortho Foot & Ankle    PR OSTEOTOMY METATARSAL (NOT 1ST) Left 06/06/2018    Procedure: OSTEOTOMY, W/WO LENGTHENING, SHORTENING OR ANGULAR CORRECTION, METATARSAL; OTHER THAN FIRST METATARSAL, EA;  Surgeon: Valda Favia, MD;  Location: [facility];  Service: Orthopedics    PR PART REMV PHALANX OF TOE Left 06/06/2018    Procedure: PART EXC BONE PHALANX TOE;  Surgeon: Valda Favia, MD;  [facility]: [facility];  Service: Orthopedics    PR RECONSTR TOTAL SHOULDER IMPLANT Left 11/12/2018    Procedure: R28   ARTHROPLASTY, GLENOHUMERAL JOINT; TOTAL SHOULDER(GLENOID & PROXIMAL HUMERAL REPLACEMENT(EG, TOTAL SHOULDER);  Surgeon: Nickola Major, MD;  Location: [facility] OR [facility];  Service: Orthopedics    PR REMOVAL DEEP IMPLANT Left 11/12/2018    Procedure: REMOVE IMPLANT; DEEP SHOULDER;  Surgeon: Nickola Major, MD;  Location: [facility] OR [facility];  Service: Orthopedics    PR REPAIR BICEPS LONG TENDON Left 11/12/2018    Procedure: TENODESIS LONG TENDON BICEPS;  Surgeon: Nickola Major, MD;  [facility]: [facility] OR [facility];  Service: Orthopedics    PR REPAIR PERONEAL TENDONS,FIB OSTEOTMY Left 07/20/2017    Procedure: REPR DISLOC PERONEAL TENDONS; W/FIB OSTEOTOMY;  Surgeon: Valda Favia, MD;  Location: [facility] OR [facility];  Service: Ortho Foot & Ankle    PR REVISE MEDIAN N/CARPAL TUNNEL SURG Left 02/17/2019    Procedure: R22 NEUROPLASTY AND/OR TRANSPOSITION; MEDIAN NERVE AT CARPAL TUNNEL;  Surgeon: Theodora Blow Jacqlyn Krauss, MD;  Location: [facility] OR [facility];  Service: Orthopedics    PR REVISE ULNAR NERVE AT ELBOW Left 02/17/2019    Procedure: NEUROPLASTY AND/OR TRANSPOSITION; ULNAR NERVE AT ELBOW;  Surgeon: Daisy Lazar, MD;  Location: [facility] OR [place];  Service: Orthopedics    SHOULDER SURGERY Bilateral     L TSA, R ROTATOR CUFF      SKIN BIOPSY      SPINE SURGERY  1986/1995/2000/2002    LUMBAR FUSION    stent         Family History   Problem Relation Age of Onset    Diabetes Mother     Stroke Mother     Cataracts Mother     Hypertension Mother     Alcohol abuse Father     Cancer Father     Diabetes Father     Cataracts Father     Hypertension Father     Melanoma Father     Hypertension Maternal Grandmother     Cancer Maternal Grandmother     Hypertension Maternal Grandfather     Hypertension Paternal Grandmother     Cataracts Paternal Grandfather     Hypertension Paternal Grandfather     COPD Brother         moker and alcholic    Early death Brother     COPD Sister         was a smoker    Diabetes Sister     COPD Brother         moker    Diabetes Brother     Diabetes Sister     Kidney disease Maternal Aunt         died from kidney diease due to diabeties    No Known Problems Maternal Uncle     No Known Problems Paternal Aunt     No Known Problems Paternal Uncle     No Known Problems Other     Blindness Neg Hx     Thyroid disease Neg Hx     Macular degeneration Neg Hx     Glaucoma Neg Hx     Basal cell carcinoma Neg Hx     Squamous cell carcinoma Neg Hx     Anesthesia problems Neg Hx     Broken bones Neg Hx     Clotting disorder Neg Hx     Collagen disease Neg Hx     Dislocations Neg Hx     Fibromyalgia Neg Hx     Gout Neg Hx     Hemophilia Neg Hx     Osteoporosis Neg Hx     Rheumatologic disease Neg Hx     Scoliosis Neg Hx     Severe sprains Neg Hx     Sickle cell anemia Neg Hx     Spinal Compression Fracture Neg Hx        Social History     Tobacco Use    Smoking status: Never    Smokeless tobacco: Never    Tobacco comments:     never will   Vaping Use    Vaping Use: Never used   Substance Use Topics    Alcohol use: No     Alcohol/week: 0.0 standard drinks of alcohol    Drug use: Not Currently       OBJECTIVE:  Pulse 69  - Temp 36 ??C (96.8 ??F) (Temporal) - Resp 17  - Ht 167.6 cm (5' 5.98)  - Wt 90.4 kg (199 lb 4.7 oz)  - SpO2 96%  - BMI 32.18 kg/m??   Wt Readings from Last 12 Encounters:   03/21/22 90.4 kg (199 lb 4.7 oz)   03/02/22 83.6 kg (184 lb 3.2 oz)  03/01/22 83 kg (183 lb)   02/21/22 81.2 kg (179 lb)   02/01/22 81.2 kg (179 lb)   01/31/22 81.6 kg (179 lb 12.8 oz)   12/22/21 81.6 kg (180 lb)   11/16/21 81.9 kg (180 lb 9.6 oz)   08/16/21 87.6 kg (193 lb 3.2 oz)   08/09/21 86.2 kg (190 lb 0.3 oz)   05/24/21 90.3 kg (199 lb)   05/11/21 88.9 kg (196 lb)     Physical Exam  Vitals and nursing note reviewed.   Constitutional:       General: He is not in acute distress.     Appearance: He is not ill-appearing.      Comments: drowsy   HENT:      [name]: Normocephalic and atraumatic.   Cardiovascular:      Rate and Rhythm: Normal rate and regular rhythm.   Pulmonary:      Effort: Pulmonary effort is normal. No respiratory distress.      Comments: Supplemental O2 via [place]  Skin:     General: Skin is warm and dry.      Comments: Surgical incisions, mid-sternal, s/p CABG 11/27   Neurological:      Mental Status: He is oriented to person, place, and time.             BG/insulin reviewed per EMR.   Glucose, POC (mg/dL)   Date Value   96/29/5284 110   03/21/2022 119   03/21/2022 119   03/21/2022 146   03/21/2022 191 (H)   03/21/2022 216 (H)   03/20/2022 207 (H)   03/20/2022 249 (H)   12/28/2015 121   12/28/2015 139   12/28/2015 136   12/27/2015 118   12/27/2015 113   12/27/2015 116   12/27/2015 149   12/26/2015 178        Summary of labs:  Lab Results   Component Value Date    A1C 7.9 (H) 12/22/2021    A1C 7.8 (H) 08/09/2021    A1C 7.7 (H) 04/05/2021     Lab Results   Component Value Date    CREATININE 0.97 03/21/2022     Lab Results   Component Value Date    WBC 9.8 03/21/2022    HGB 10.4 (L) 03/21/2022    HCT 31.1 (L) 03/21/2022    PLT 128 (L) 03/21/2022       Lab Results   Component Value Date    NA 138 03/21/2022    K 5.1 (H) 03/21/2022    CL 109 (H) 03/21/2022    CO2 22.0 03/21/2022    BUN 15 03/21/2022    CREATININE 0.97 03/21/2022    GLU 152 (H) 03/21/2022    CALCIUM 8.8 03/21/2022    MG 2.6 03/21/2022    PHOS 2.8 03/21/2022       Lab Results   Component Value Date    BILITOT 0.6 03/20/2022    BILIDIR 0.20 03/20/2022    PROT 5.8 03/20/2022    ALBUMIN 3.5 03/20/2022    ALT 12 03/20/2022    AST 31 03/20/2022    ALKPHOS 41 (L) 03/20/2022

## 2022-03-21 NOTE — Unmapped (Signed)
Addendum  created 03/21/22 1503 by Leonidas Romberg, MD    Clinical Note Signed, Intraprocedure Event edited

## 2022-03-21 NOTE — Unmapped (Signed)
Events: pt was given 500 of LR and 500 of albumin and responded well to it, however was quick to rebound down to original hemodynamic lability; pt had one episode of emesis and xray showed gas bubble, team following and OG on low intermittent suction; potassium was 7.3, shifted and ended at 5.2, team following but provider did not order an additional shifting of potassium; endotool started; fentanyl x2 and oxy 10x2 for uncontrolled pain, pain improving but not resolved; pt not tolerating SBT, needs more time;    Pt afebrile, and follows commands in all extremities, with weak strength. NSR and remains hemodynamically labile. Pressure support 5/5, clear and diminished, minimal inline secretion. NPO, bm pta and voiding well via foley periodically.      Electrolytes replaced, see flowsheets for more.    Problem: Adult Inpatient Plan of Care  Goal: Plan of Care Review  Outcome: Progressing  Goal: Patient-Specific Goal (Individualized)  Outcome: Progressing  Goal: Absence of Hospital-Acquired Illness or Injury  Outcome: Progressing  Intervention: Identify and Manage Fall Risk  Recent Flowsheet Documentation  Taken 03/20/2022 2000 by Einar Crow, RN  Safety Interventions:   aspiration precautions   bed alarm   bleeding precautions   environmental modification   low bed   room near unit station  Intervention: Prevent Skin Injury  Recent Flowsheet Documentation  Taken 03/21/2022 0600 by Einar Crow, RN  Positioning for Skin: Right  Taken 03/21/2022 0400 by Einar Crow, RN  Positioning for Skin: Left  Taken 03/21/2022 0200 by Einar Crow, RN  Positioning for Skin: Right  Taken 03/21/2022 0000 by Einar Crow, RN  Positioning for Skin: Left  Taken 03/20/2022 2200 by Einar Crow, RN  Positioning for Skin: Right  Taken 03/20/2022 2000 by Einar Crow, RN  Positioning for Skin: Left  Device Skin Pressure Protection:   absorbent pad utilized/changed   adhesive use limited  Skin Protection: adhesive use limited   incontinence pads utilized  Intervention: Prevent and Manage VTE (Venous Thromboembolism) Risk  Recent Flowsheet Documentation  Taken 03/21/2022 0600 by Einar Crow, RN  Anti-Embolism Device Type: SCD, Knee  Anti-Embolism Intervention: Off  Anti-Embolism Device Location: BLE  Taken 03/21/2022 0400 by Einar Crow, RN  Anti-Embolism Device Type: SCD, Knee  Anti-Embolism Intervention: Off  Anti-Embolism Device Location: BLE  Taken 03/21/2022 0200 by Einar Crow, RN  Anti-Embolism Device Type: SCD, Knee  Anti-Embolism Intervention: Off  Anti-Embolism Device Location: BLE  Taken 03/21/2022 0000 by Einar Crow, RN  Anti-Embolism Device Type: SCD, Knee  Anti-Embolism Intervention: Off  Anti-Embolism Device Location: BLE  Taken 03/20/2022 2200 by Einar Crow, RN  Anti-Embolism Device Type: SCD, Knee  Anti-Embolism Intervention: Off  Anti-Embolism Device Location: BLE  Taken 03/20/2022 2000 by Einar Crow, RN  Anti-Embolism Device Type: SCD, Knee  Anti-Embolism Intervention: Off  Anti-Embolism Device Location: BLE  Intervention: Prevent Infection  Recent Flowsheet Documentation  Taken 03/20/2022 2000 by Einar Crow, RN  Infection Prevention:   environmental surveillance performed   equipment surfaces disinfected   rest/sleep promoted   hand hygiene promoted   personal protective equipment utilized  Goal: Optimal Comfort and Wellbeing  Outcome: Progressing  Goal: Readiness for Transition of Care  Outcome: Progressing  Goal: Rounds/Family Conference  Outcome: Progressing     Problem: Wound  Goal: Optimal Coping  Outcome: Progressing  Goal: Optimal Functional Ability  Outcome: Progressing  Intervention: Optimize Functional Ability  Recent Flowsheet Documentation  Taken 03/21/2022 0600 by Einar Crow, RN  Activity Management: bedrest  Taken 03/21/2022 0400 by Einar Crow, RN  Activity Management: bedrest  Taken 03/21/2022 0200 by Einar Crow, RN  Activity Management: bedrest  Taken 03/21/2022 0000 by Einar Crow, RN  Activity Management: bedrest  Taken 03/20/2022 2200 by Einar Crow, RN  Activity Management: bedrest  Taken 03/20/2022 2000 by Einar Crow, RN  Activity Management: bedrest  Goal: Absence of Infection Signs and Symptoms  Outcome: Progressing  Goal: Improved Oral Intake  Outcome: Progressing  Goal: Optimal Pain Control and Function  Outcome: Progressing  Goal: Skin Health and Integrity  Outcome: Progressing  Intervention: Optimize Skin Protection  Recent Flowsheet Documentation  Taken 03/21/2022 0600 by Einar Crow, RN  Activity Management: bedrest  Head of Bed [facility]) Positioning: HOB at 30-45 degrees  Taken 03/21/2022 0400 by Einar Crow, RN  Activity Management: bedrest  Head of Bed [facility]) Positioning: HOB at 30-45 degrees  Taken 03/21/2022 0200 by Einar Crow, RN  Activity Management: bedrest  Head of Bed [facility]) Positioning: HOB at 30-45 degrees  Taken 03/21/2022 0000 by Einar Crow, RN  Activity Management: bedrest  Head of Bed [facility]) Positioning: HOB at 30-45 degrees  Taken 03/20/2022 2200 by Einar Crow, RN  Activity Management: bedrest  Head of Bed [facility]) Positioning: HOB at 30-45 degrees  Taken 03/20/2022 2000 by Einar Crow, RN  Activity Management: bedrest  Pressure Reduction Techniques:   frequent weight shift encouraged   pressure points protected   heels elevated off bed  Head of Bed (HOB) Positioning: HOB at 30-45 degrees  Pressure Reduction Devices: pressure-redistributing mattress utilized  Skin Protection:   adhesive use limited   incontinence pads utilized  Goal: Optimal Wound Healing  Outcome: Progressing     Problem: Skin Injury Risk Increased  Goal: Skin Health and Integrity  Outcome: Progressing  Intervention: Optimize Skin Protection  Recent Flowsheet Documentation  Taken 03/21/2022 0600 by Einar Crow, RN  Activity Management: bedrest  Head of Bed [facility]) Positioning: HOB at 30-45 degrees  Taken 03/21/2022 0400 by Einar Crow, RN  Activity Management: bedrest  Head of Bed [facility]) Positioning: HOB at 30-45 degrees  Taken 03/21/2022 0200 by Einar Crow, RN  Activity Management: bedrest  Head of Bed [facility]) Positioning: HOB at 30-45 degrees  Taken 03/21/2022 0000 by Einar Crow, RN  Activity Management: bedrest  Head of Bed [facility]) Positioning: HOB at 30-45 degrees  Taken 03/20/2022 2200 by Einar Crow, RN  Activity Management: bedrest  Head of Bed [facility]) Positioning: HOB at 30-45 degrees  Taken 03/20/2022 2000 by Einar Crow, RN  Activity Management: bedrest  Pressure Reduction Techniques:   frequent weight shift encouraged   pressure points protected   heels elevated off bed  Head of Bed (HOB) Positioning: HOB at 30-45 degrees  Pressure Reduction Devices: pressure-redistributing mattress utilized  Skin Protection:   adhesive use limited   incontinence pads utilized     Problem: Fall Injury Risk  Goal: Absence of Fall and Fall-Related Injury  Outcome: Progressing  Intervention: Promote Scientist, clinical (histocompatibility and immunogenetics) Documentation  Taken 03/20/2022 2000 by Einar Crow, RN  Safety Interventions:   aspiration precautions   bed alarm   bleeding precautions   environmental modification   low bed   room near unit station     Problem: Self-Care Deficit  Goal: Improved Ability to Complete Activities of Daily Living  Outcome: Progressing     Problem: Mechanical Ventilation Invasive  Goal: Effective Communication  Outcome: Progressing  Goal: Optimal Device Function  Outcome: Progressing  Intervention: Optimize Device Care and Function  Recent Flowsheet Documentation  Taken 03/21/2022 0600 by Einar Crow, RN  Oral Care:   mouth swabbed   oral rinse provided   suction provided  Taken 03/21/2022 0400 by Einar Crow, RN  Oral Care:   mouth swabbed   oral rinse provided   suction provided  Taken 03/21/2022 0200 by Einar Crow, RN  Oral Care:   mouth swabbed oral rinse provided   suction provided  Taken 03/21/2022 0000 by Einar Crow, RN  Oral Care:   mouth swabbed   oral rinse provided   suction provided  Taken 03/20/2022 2200 by Einar Crow, RN  Oral Care:   mouth swabbed   oral rinse provided   suction provided  Taken 03/20/2022 2000 by Einar Crow, RN  Airway/Ventilation Management:   airway patency maintained   calming measures promoted   pulmonary hygiene promoted  Oral Care:   mouth swabbed   oral rinse provided   suction provided  Airway Safety Measures:   in-line pressure manometer   in-line pressure limit valve   manual resuscitator/mask at bedside   oxygen flowmeter at bedside   suction at bedside   high-efficiency antimicrobial filters maintained  Goal: Mechanical Ventilation Liberation  Outcome: Progressing  Goal: Optimal Nutrition Delivery  Outcome: Progressing  Goal: Absence of Device-Related Skin and Tissue Injury  Outcome: Progressing  Intervention: Maintain Skin and Tissue Health  Recent Flowsheet Documentation  Taken 03/20/2022 2000 by Einar Crow, RN  Device Skin Pressure Protection:   absorbent pad utilized/changed   adhesive use limited  Goal: Absence of Ventilator-Induced Lung Injury  Outcome: Progressing  Intervention: Prevent Ventilator-Associated Pneumonia  Recent Flowsheet Documentation  Taken 03/21/2022 0600 by Einar Crow, RN  Head of Bed [facility]) Positioning: HOB at 30-45 degrees  Oral Care:   mouth swabbed   oral rinse provided   suction provided  Taken 03/21/2022 0400 by Einar Crow, RN  Head of Bed [facility]) Positioning: HOB at 30-45 degrees  Oral Care:   mouth swabbed   oral rinse provided   suction provided  Taken 03/21/2022 0200 by Einar Crow, RN  Head of Bed [facility]) Positioning: HOB at 30-45 degrees  Oral Care:   mouth swabbed   oral rinse provided   suction provided  Taken 03/21/2022 0000 by Einar Crow, RN  Head of Bed [facility]) Positioning: HOB at 30-45 degrees  Oral Care:   mouth swabbed   oral rinse provided   suction provided  Taken 03/20/2022 2200 by Einar Crow, RN  Head of Bed [facility]) Positioning: HOB at 30-45 degrees  Oral Care:   mouth swabbed   oral rinse provided   suction provided  Taken 03/20/2022 2000 by Einar Crow, RN  Head of Bed [facility]) Positioning: HOB at 30-45 degrees  Oral Care:   mouth swabbed   oral rinse provided   suction provided

## 2022-03-21 NOTE — Unmapped (Cosign Needed)
CVT ICU CRITICAL CARE NOTE     Date of Service: 03/21/2022    Hospital Day: LOS: 1 day        Surgery Date: 11/27  Surgical Attending: Arlester Marker, MD    Critical Care Attending: Gordy Clement, MD    Interval History:   Extubated. Weaning norepi. Start ASA, statin, SCH.    History of Present Illness:   Christopher [name] is a 71 y.o. male w/ angina, CAD (LAD and OM), HLD, T2DM (HgbA1C 7.9), HTN, statin intolerance, TIA     Hospital Course:  11/27 CABG x2 (LIMA to LAD, RSVG to OM), no TEE due to esophageal stricture     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 *     ASSESSMENT & PLAN:     Cardiovascular: Postoperative state from CABG  - No TEE done 2/2 esophageal stricture. Normal EF on TTE.  - Intraoperative blood products: 600 mL cell saver returned, 340 autologous blood returned  - Goal MAP 65-50mmHg, CI >2.2, SvO2 60-80   - NE, weaning  - Mediastinal tubes to -20 cm suction, monitor output  - Epicardial pacing wires: A's only    Respiratory: Post-operative mechanical ventilation  - Goal SpO2 >92%, wean oxygen as tolerated  - Aggressive pulmonary hygiene, encourage IS  - Pleural drains to -20cm suction, monitor output    Neurologic: Post-operative pain  - Pain: Fentanyl PRN, oxy PRN  - Sedation: Prop  - Frailty score:     - Frail (3-5)   - Pre-frail (1-2)   - Robust (0)    Renal/Genitourinary:  NAI  - Replete electrolytes PRN  - Strict I&O    Gastrointestinal: Gastrointestinal bleed  - Diet: Reg  - Bowel regimen: miralax, colace    Endocrine:  T2DM  - Glycemic Control: Endotool    Hematologic: Acute blood loss anemia secondary to cardiac surgery    - Monitor CBC daily, transfuse products as indicated  - Monitor chest tube output    Immunologic/Infectious Disease:  - Afebrile, leukocytosis likely secondary to post-operative inflammatory state, continue to monitor    Integumentary  - Skin bundle discussed on AM rounds? Yes  - Pressure injury present on admission to CVTICU? No  - Is CWOCN consulted? No  - Are any CWOCN verified pressure injuries present since admission to CVTICU? No    Mobility   Mobility level 1. HOB elevated to 30* for 1 hour (2x per shift)   PT/OT consulted Yes.    Daily Care Checklist:   - Stress Ulcer Prevention: Yes: Mechanically ventilated  - DVT Prophylaxis: Mechanical: Yes.  - HOB >30 degrees: Yes   - Daily Awakening: Yes  - Spontaneous Breathing Trial: Yes  - Indication for Beta Blockade: No  - Indication for Central/PICC Line: Yes  Infusions requiring central access, Hemodynamic monitoring, and Aggressive fluid resuscitation  -  Indication for Urinary Catheter: Yes  Strict intake and output, Critically ill, and Agressive diuresis/hydration  - Diagnostic images/reports of past 24hrs reviewed: Yes    Disposition:   - Continue ICU care    [name] is critically ill due to: Postoperative state from CABG, lactic acidosis, postoperative mechanical ventilation, hypotension, surgical pain  This critical care time includes examining the patient, evaluating the hemodynamic, laboratory, and radiographic data, independently developing a comprehensive management plan, and serially assessing the patient's response to these critical care interventions. This critical care time excludes procedures.    Critical care time: 50 minutes     Vernetta Honey, PA   Cardiovascular and Thoracic Intensive Care Unit  Department of Anesthesiology  [place] [facility] [place] [facility]     SUBJECTIVE:      Pain well managed.       OBJECTIVE:     Physical Exam:  Constitutional: M appearing stated age, NAD  Neurologic: Alert, oriented, PERRL, no focal deficits  Respiratory: Nonlabored on [place]  Cardiovascular: RRR, minimal edema  Gastrointestinal:  Flat, NTND  Musculoskeletal: MAE to command  Skin: Warm, dry  Sternotomy incision: Primary OR dressing c/d/i    Core Temp:  [35.9 ??C (96.6 ??F)-38.2 ??C (100.8 ??F)] 37 ??C (98.6 ??F)  Heart Rate:  [65-89] 68  SpO2 Pulse:  [65-89] 67  Resp:  [0-24] 15  BP: (96-112)/(58-79) 112/79  MAP (mmHg):  [68-89] 89  A BP-1: (77-141)/(47-121) 115/75  MAP:  [59 mmHg-131 mmHg] 90 mmHg  FiO2 (%):  [40 %-70 %] 40 %  SpO2:  [92 %-98 %] 97 %  CVP:  [0 mmHg-19 mmHg] 11 mmHg    Recent Laboratory Results:  Recent Labs   Lab Units 03/21/22  0749   PH ART  7.26*   PCO2 ART mm Hg 39.7   PO2 ART mm Hg 115.0*   HCO3 ART mmol/L 17*   BASE EXC ART  -8.6*   O2 SAT ART % 98.0       Recent Labs   Lab Units 03/21/22  0749 03/21/22  0640 03/21/22  0335 03/20/22  2106 03/20/22  2033 03/20/22  1454 03/20/22  1436   SODIUM WHOLE BLOOD mmol/L 139 138 134*   < > 132*   < >  --    SODIUM mmol/L  --   --  138  --  139  --  143   POTASSIUM WHOLE BLOOD mmol/L 4.8* 5.1* 4.9*   < > 7.8*   < >  --    POTASSIUM mmol/L  --   --  5.2*   < > 7.3*  --  3.1*   CHLORIDE mmol/L  --   --  109*  --  109*  --  110*   CO2 mmol/L  --   --  22.0  --  24.0  --  20.0   BUN mg/dL  --   --  15  --  14  --  13   CREATININE mg/dL  --   --  1.61  --  0.96  --  0.75   GLUCOSE mg/dL  --   --  045*  --  409*  --  141    < > = values in this interval not displayed.       Lab Results   Component Value Date    BILITOT 0.6 03/20/2022    BILITOT 0.4 03/01/2022    BILIDIR 0.20 03/20/2022    ALT 12  03/20/2022    ALT 14 03/01/2022    AST 31 03/20/2022    AST 21 03/01/2022    ALKPHOS 41 (L) 03/20/2022 ALKPHOS 61 03/01/2022    PROT 5.8 03/20/2022    PROT 7.7 03/01/2022    ALBUMIN 3.5 03/20/2022    ALBUMIN 4.1 03/01/2022     Recent Labs   Lab Units 03/21/22  0648 03/21/22  0552 03/21/22  0458 03/21/22  0340   POC GLUCOSE mg/dL [phone]       Recent Labs   Lab Units 03/21/22  0335 03/20/22  1733 03/20/22  1340   WBC 10*9/L 9.8  --  15.5*   RBC 10*12/L 3.31*  --  3.74*   HEMOGLOBIN g/dL 95.2*  --  84.1*   HEMOGLOBIN BG g/dL  --  32.4*  --    HEMATOCRIT % 31.1*  --  34.4*   MCV fL 93.9  --  92.0   MCH pg 31.5  --  30.9   MCHC g/dL 40.1  --  02.7   RDW % 13.6  --  13.5   PLATELET COUNT (1) 10*9/L 128*  --  146*   MPV fL 7.9  --  7.7       Recent Labs   Lab Units 03/21/22  0335 03/20/22  1340   INR  1.09 1.18   APTT sec  --  26.8        Lines & Tubes:   Patient Lines/Drains/Airways Status       Active Peripheral & Central Intravenous Access       Name Placement date Placement time Site Days    Peripheral IV 03/20/22 Left Hand 03/20/22  0710  Hand  1    CVC Triple Lumen 03/20/22 Right Internal jugular 03/20/22  1300  Internal jugular  less than 1    CVC MAC Introducer 03/20/22 Right Internal jugular 03/20/22  1300  Internal jugular  less than 1                  Urethral Catheter Non-latex;Straight-tip;Temperature probe 16 Fr. (Active)   Output (mL) 150 mL 03/20/22 1335   Number of days: 0     Patient Lines/Drains/Airways Status       Active Wounds       Name Placement date Placement time Site Days    Surgical Site 03/20/22 Chest Mid 03/20/22  1000  -- 1    Surgical Site 03/20/22 Leg Right 03/20/22  1233  -- less than 1                     Respiratory/ventilator settings for last 24 hours:   Vent Mode: PSV-CPAP  FiO2 (%): 40 %  S RR: 16  S VT: 440 mL  PEEP: 5 cm H20  PR SUP: 5 cm H20    Intake/Output last 3 shifts:  I/O last 3 completed shifts:  In: 5753.6 [I.V.:2952.8; Blood:940; IV Piggyback:1860.8]  Out: 2940 [Urine:1860; Emesis/NG output:100; Blood:200; Chest Tube:780]    Daily/Recent Weight:  90.4 kg (199 lb 4.7 oz)    BMI:  Body mass index is 32.18 kg/m??.                                  Medical History:  Past Medical History:   Diagnosis Date    Acute kidney injury (CMS-HCC) 01/12/2018    Anemia     IRON  DEFICIENT     Anxiety dont know    Arthritis     Basal cell carcinoma     Cataract     CHF (congestive heart failure) (CMS-HCC)     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    Corneal abrasion 1975    steel in right eye with abrasion and removal    Cubital tunnel syndrome on left 01/16/2019    Depression 2016    Diabetes mellitus (CMS-HCC)     GERD (gastroesophageal reflux disease) 1982    Hypertension     Meningitis 12/25/2015    Mixed hyperlipidemia     Myocardial infarction (CMS-HCC) 1986    Obesity 1986    first back surgery    Peptic ulceration      Past Surgical History:   Procedure Laterality Date    BACK SURGERY      CARDIAC CATHETERIZATION      STENTS PLACEMENT     CARDIAC SURGERY  2005    stents    CORONARY STENT PLACEMENT      FOREIGN BODY REMOVAL Right about 20 years ago    HERNIA REPAIR      RIGHT INGUNIAL    JOINT REPLACEMENT Left     TOTAL SHOULDER    PR ARM/ELBOW TENDON LENGTHEN,SINGLE,EA Left 02/17/2019    Procedure: TENDON LENGTHENING UPPER ARM/ELBOW SNGL EA;  Surgeon: Daisy Lazar, MD;  [facility];  Service: Orthopedics    PR CATH PLACE/CORON ANGIO, IMG SUPER/INTERP,W LEFT HEART VENTRICULOGRAPHY N/A 02/01/2022    Procedure: Left Heart Catheterization;  Surgeon: Jacquelyne Balint, MD;  Location: [facility] CATH;  Service: Cardiology    PR OSTEOTOMY 1ST METATARSAL,BASE/SHAFT Left 07/20/2017    Procedure: OSTEOTOMY, W/WO LENGTHENING, SHORTENING OR ANGULAR CORRECTION, METATARSAL; FIRST METATARSAL;  Surgeon: Valda Favia, MD;  Location: [facility];  Service: Ortho Foot & Ankle    PR OSTEOTOMY HEEL BONE Left 07/20/2017    Procedure: OSTEOTOMYLoistine Chance W/WO INT FIXA;  Surgeon: Valda Favia, MD;  [facility];  Service: Ortho Foot & Ankle    PR OSTEOTOMY METATARSAL (NOT 1ST) Left 06/06/2018    Procedure: OSTEOTOMY, W/WO LENGTHENING, SHORTENING OR ANGULAR CORRECTION, METATARSAL; OTHER THAN FIRST METATARSAL, EA;  Surgeon: Valda Favia, MD;  [facility];  Service: Orthopedics    PR PART REMV PHALANX OF TOE Left 06/06/2018    Procedure: PART EXC BONE PHALANX TOE;  Surgeon: Valda Favia, MD;  [facility]: [facility];  Service: Orthopedics    PR RECONSTR TOTAL SHOULDER IMPLANT Left 11/12/2018    Procedure: R28   ARTHROPLASTY, GLENOHUMERAL JOINT; TOTAL SHOULDER(GLENOID & PROXIMAL HUMERAL REPLACEMENT(EG, TOTAL SHOULDER);  Surgeon: Nickola Major, MD;  [facility];  Service: Orthopedics    PR REMOVAL DEEP IMPLANT Left 11/12/2018    Procedure: REMOVE IMPLANT; DEEP SHOULDER;  Surgeon: Nickola Major, MD;  [facility] OR [facility];  Service: Orthopedics    PR REPAIR BICEPS LONG TENDON Left 11/12/2018    Procedure: TENODESIS LONG TENDON BICEPS;  Surgeon: Nickola Major, MD;  [facility];  Service: Orthopedics    PR REPAIR PERONEAL TENDONS,FIB OSTEOTMY Left 07/20/2017    Procedure: REPR DISLOC PERONEAL TENDONS; W/FIB OSTEOTOMY;  Surgeon: Valda Favia, MD;  Location: [facility];  Service: Ortho Foot & Ankle    PR REVISE MEDIAN N/CARPAL TUNNEL SURG Left 02/17/2019    Procedure: R22 NEUROPLASTY AND/OR TRANSPOSITION; MEDIAN NERVE AT CARPAL  TUNNEL;  Surgeon: Theodora Blow Jacqlyn Krauss, MD;  Location: [facility] OR [facility];  Service: Orthopedics    PR REVISE ULNAR NERVE AT ELBOW Left 02/17/2019    Procedure: NEUROPLASTY AND/OR TRANSPOSITION; ULNAR NERVE AT ELBOW;  Surgeon: Daisy Lazar, MD;  Location: [facility];  Service: Orthopedics    SHOULDER SURGERY Bilateral     L TSA, R ROTATOR CUFF      SKIN BIOPSY      SPINE SURGERY  1986/1995/2000/2002    LUMBAR FUSION    stent       Scheduled Medications:   acetaminophen  1,000 mg Oral QID    [START ON 03/22/2022] aspirin  81 mg Oral Daily    colchicine  0.6 mg Oral BID    docusate sodium  100 mg Oral BID    gabapentin  300 mg Oral BID    heparin (porcine) for subcutaneous use  5,000 Units Subcutaneous Q8H SCH    lidocaine  3 patch Transdermal Daily    magnesium oxide  400 mg Oral BID    melatonin  6 mg Oral QPM    mupirocin   Each Nare BID    naloxegoL  25 mg Oral Daily    polyethylene glycol  17 g Oral 3xd Meals    pravastatin  40 mg Oral Daily    sertraline  100 mg Oral Daily    traZODone  150 mg Oral Nightly     Continuous Infusions:   insulin regular 1.4 Units/hr (03/21/22 1000)    lactated Ringers Stopped (03/20/22 0837)    NORepinephrine bitartrate-NS Stopped (03/21/22 1023)    sodium chloride       PRN Medications:  calcium chloride **AND** Notify Provider **AND** Obtain lab:   CALCIUM, IONIZED, dextrose in water, fentaNYL (PF), ipratropium-albuteroL, magnesium sulfate in water **AND** Notify Provider **AND** Notify Provider **AND** Obtain lab:   MAGNESIUM, SERUM, ondansetron, oxyCODONE, oxyCODONE, potassium chloride in water **OR** potassium chloride in water

## 2022-03-21 NOTE — Unmapped (Signed)
Patient arrived to TICU at 1330 on 11/27 after CABG. Arrived on unit on .04 of epi. Weaned and turned off per provider order.Plasmalyte. 1.75 L of LR, and 250 ml of albumin given to maintain MAPs above 65. Potassium and Calcium replaced. Noroepi titrated to maintain MAPs above 65. Paused at 1806. Sedation weaned and paused at 1518. Unable to extubate prior to shift change. Family visited and updated at bedside.   Problem: Non-Violent Restraints  Goal: Patient will remain free of restraint events  Outcome: Ongoing - Unchanged  Goal: Patient will remain free of physical injury  Outcome: Ongoing - Unchanged     Problem: Adult Inpatient Plan of Care  Goal: Plan of Care Review  Outcome: Ongoing - Unchanged  Goal: Patient-Specific Goal (Individualized)  Outcome: Ongoing - Unchanged  Goal: Absence of Hospital-Acquired Illness or Injury  Outcome: Ongoing - Unchanged  Intervention: Identify and Manage Fall Risk  Recent Flowsheet Documentation  Taken 03/20/2022 1400 by Denice Bors, RN  Safety Interventions:   aspiration precautions   bleeding precautions   fall reduction program maintained   infection management   lighting adjusted for tasks/safety   low bed   nonskid shoes/slippers when out of bed   room near unit station  Intervention: Prevent Skin Injury  Recent Flowsheet Documentation  Taken 03/20/2022 1800 by Denice Bors, RN  Positioning for Skin: Right  Taken 03/20/2022 1600 by Denice Bors, RN  Positioning for Skin: Left  Taken 03/20/2022 1400 by Denice Bors, RN  Positioning for Skin: Supine/Back  Device Skin Pressure Protection:   absorbent pad utilized/changed   adhesive use limited   pressure points protected   skin-to-device areas padded   skin-to-skin areas padded  Skin Protection:   adhesive use limited   incontinence pads utilized   silicone foam dressing in place   skin-to-device areas padded   skin-to-skin areas padded   transparent dressing maintained   tubing/devices free from skin contact  Taken 03/20/2022 1330 by Denice Bors, RN  Positioning for Skin: Supine/Back  Intervention: Prevent and Manage VTE (Venous Thromboembolism) Risk  Recent Flowsheet Documentation  Taken 03/20/2022 1800 by Denice Bors, RN  Anti-Embolism Intervention: Off  Taken 03/20/2022 1600 by Denice Bors, RN  Anti-Embolism Intervention: Off  Taken 03/20/2022 1330 by Denice Bors, RN  VTE Prevention/Management:   anticoagulant therapy   bleeding precautions maintained   bleeding risk factors identified  Anti-Embolism Intervention: Off  Intervention: Prevent Infection  Recent Flowsheet Documentation  Taken 03/20/2022 1400 by Denice Bors, RN  Infection Prevention:   cohorting utilized   environmental surveillance performed   equipment surfaces disinfected   hand hygiene promoted   personal protective equipment utilized   rest/sleep promoted   single patient room provided   visitors restricted/screened  Goal: Optimal Comfort and Wellbeing  Outcome: Ongoing - Unchanged  Goal: Readiness for Transition of Care  Outcome: Ongoing - Unchanged  Goal: Rounds/Family Conference  Outcome: Ongoing - Unchanged     Problem: Wound  Goal: Optimal Coping  Outcome: Ongoing - Unchanged  Goal: Optimal Functional Ability  Outcome: Ongoing - Unchanged  Intervention: Optimize Functional Ability  Recent Flowsheet Documentation  Taken 03/20/2022 1800 by Denice Bors, RN  Activity Management: bedrest  Taken 03/20/2022 1600 by Denice Bors, RN  Activity Management: bedrest  Taken 03/20/2022 1400 by Denice Bors, RN  Activity Management: bedrest  Goal: Absence of Infection Signs and Symptoms  Outcome: Ongoing - Unchanged  Intervention:  Prevent or Manage Infection  Recent Flowsheet Documentation  Taken 03/20/2022 1400 by Denice Bors, RN  Infection Management: aseptic technique maintained  Goal: Improved Oral Intake  Outcome: Ongoing - Unchanged  Goal: Optimal Pain Control and Function  Outcome: Ongoing - Unchanged  Goal: Skin Health and Integrity  Outcome: Ongoing - Unchanged  Intervention: Optimize Skin Protection  Recent Flowsheet Documentation  Taken 03/20/2022 1800 by Denice Bors, RN  Activity Management: bedrest  Head of Bed [facility] Positioning: HOB at 30-45 degrees  Taken 03/20/2022 1600 by Denice Bors, RN  Activity Management: bedrest  Head of Bed [facility]) Positioning: HOB at 30-45 degrees  Taken 03/20/2022 1400 by Denice Bors, RN  Activity Management: bedrest  Pressure Reduction Techniques:   positioned off wounds   weight shift assistance provided  Head of Bed (HOB) Positioning: HOB flat  Pressure Reduction Devices: pressure-redistributing mattress utilized  Skin Protection:   adhesive use limited   incontinence pads utilized   silicone foam dressing in place   skin-to-device areas padded   skin-to-skin areas padded   transparent dressing maintained   tubing/devices free from skin contact  Taken 03/20/2022 1345 by Denice Bors, RN  Head of Bed [facility]) Positioning: HOB flat  Goal: Optimal Wound Healing  Outcome: Ongoing - Unchanged     Problem: Skin Injury Risk Increased  Goal: Skin Health and Integrity  Outcome: Ongoing - Unchanged  Intervention: Optimize Skin Protection  Recent Flowsheet Documentation  Taken 03/20/2022 1800 by Denice Bors, RN  Activity Management: bedrest  Head of Bed [facility]) Positioning: HOB at 30-45 degrees  Taken 03/20/2022 1600 by Denice Bors, RN  Activity Management: bedrest  Head of Bed [facility]) Positioning: HOB at 30-45 degrees  Taken 03/20/2022 1400 by Denice Bors, RN  Activity Management: bedrest  Pressure Reduction Techniques:   positioned off wounds   weight shift assistance provided  Head of Bed (HOB) Positioning: HOB flat  Pressure Reduction Devices: pressure-redistributing mattress utilized  Skin Protection:   adhesive use limited   incontinence pads utilized   silicone foam dressing in place   skin-to-device areas padded   skin-to-skin areas padded   transparent dressing maintained   tubing/devices free from skin contact  Taken 03/20/2022 1345 by Denice Bors, RN  Head of Bed [facility]) Positioning: HOB flat     Problem: Fall Injury Risk  Goal: Absence of Fall and Fall-Related Injury  Outcome: Ongoing - Unchanged  Intervention: Promote Injury-Free Environment  Recent Flowsheet Documentation  Taken 03/20/2022 1400 by Denice Bors, RN  Safety Interventions:   aspiration precautions   bleeding precautions   fall reduction program maintained   infection management   lighting adjusted for tasks/safety   low bed   nonskid shoes/slippers when out of bed   room near unit station     Problem: Self-Care Deficit  Goal: Improved Ability to Complete Activities of Daily Living  Outcome: Ongoing - Unchanged

## 2022-03-21 NOTE — Unmapped (Signed)
Pt remains intubated and on ventilatory support. ETT size 8, 23 @ teeth. Current vent settings: PRVC 450/18/8+/50%.  Emergency supplies at bedside. Skin health noted. Pt in no apparent distress at this time.

## 2022-03-21 NOTE — Unmapped (Signed)
Care Management  Initial Transition Planning Assessment                CM assessed pt bedside. Pt is retired, lives in a house with his wife, and indep at baseline. Pt doesn't use HH or DME. Pt has transportation home and to appts.         General  Care Manager assessed the patient by : In person interview with patient, Medical record review  Orientation Level: Oriented X4  Functional level prior to admission: Independent  Reason for referral: Discharge Planning    Contact/Decision Maker  Extended Emergency Contact Information  Primary Emergency Contact: Weyman,Deborah  Address: [address]           [place], [place] [address] [place]  Home Phone: [phone]  Mobile Phone: [phone]  Relation: Spouse    Type of Residence: Mailing Address:  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  Past Surgical History:   has a past surgical history that includes Hernia repair; stent; Foreign Body Removal (Right, about 20 years ago); Cardiac catheterization; Coronary stent placement; Back surgery; Spine surgery (1986/1995/2000/2002); Cardiac surgery (2005); pr repair peroneal tendons,fib osteotmy (Left, 07/20/2017); pr osteotomy heel bone (Left, 07/20/2017); pr osteotomy 1st metatarsal,base/shaft (Left, 07/20/2017); Skin biopsy; pr osteotomy metatarsal (not 1st) (Left, 06/06/2018); pr part remv phalanx of toe (Left, 06/06/2018); pr reconstr total shoulder implant (Left, 11/12/2018); pr removal deep implant (Left, 11/12/2018); pr repair biceps long tendon (Left, 11/12/2018); pr revise median n/carpal tunnel surg (Left, 02/17/2019); pr arm/elbow tendon lengthen,single,ea (Left, 02/17/2019); pr revise ulnar nerve at elbow (Left, 02/17/2019); Joint replacement (Left); Shoulder surgery (Bilateral); and pr cath place/coron angio, img super/interp,w left heart ventriculography (N/A, 02/01/2022).   Previous admit date: 11/12/2018    Primary Insurance- Payor: Advertising copywriter MEDICARE ADV / Plan: Advertising copywriter MEDICARE ADV / Product Type: *No Product type* /   Secondary Insurance - None  Prescription Coverage -    Preferred Pharmacy - [facility] - [place], [place] - [address]  [facility] - [place] ([place]), [place] - [address]  [facility] - [place], [place] - [address]  [facility]    Transportation home: Scientific laboratory technician Next of Kin / Guardian / POA / Advance Directives     HCDM (patient stated preference): Alton Revere - Spouse - [phone]    Advance Directive (Medical Treatment)  Does patient have an advance directive covering medical treatment?: Patient does not have advance directive covering medical treatment.    Health Care Decision Maker [HCDM] (Medical & Mental Health Treatment)  Healthcare Decision Maker: HCDM documented in the HCDM/Contact Info section.         Readmission Information    Have you been  hospitalized in the last 30 days?: No        Did the following happen with your discharge?             Patient Information  Lives with: Spouse/significant other    Type of Residence: Private residence             Support Systems/Concerns: Spouse    Responsibilities/Dependents at home?: No    Home Care services in place prior to admission?: No           Equipment Currently Used at Home: none       Currently receiving outpatient dialysis?: No       Financial Information       Need for financial assistance?: No       Social Determinants of Health  Social Determinants of Health were addressed in provider documentation.  Please refer to patient history.  Social Determinants of Health     Financial Resource Strain: Low Risk  (03/21/2022)    Overall Financial Resource Strain (CARDIA)     Difficulty of Paying Living Expenses: Not very hard   Internet Connectivity: Not on file   Food Insecurity: No Food Insecurity (03/21/2022)    Hunger Vital Sign     Worried About Running Out of Food in the Last Year: Never true     Ran Out of Food in the Last Year: Never true   Tobacco Use: Low Risk  (03/02/2022)    Patient History     Smoking Tobacco Use: Never     Smokeless Tobacco Use: Never     Passive Exposure: Not on file   Housing/Utilities: Low Risk  (03/21/2022)    Housing/Utilities     Within the past 12 months, have you ever stayed: outside, in a car, in a tent, in an overnight shelter, or temporarily in someone else's home (i.e. couch-surfing)?: No     Are you worried about losing your housing?: No     Within the past 12 months, have you been unable to get utilities (heat, electricity) when it was really needed?: No   Alcohol Use: Not on file   Transportation Needs: No Transportation Needs (03/21/2022)    PRAPARE - Therapist, art (Medical): No     Lack of Transportation (Non-Medical): No   Substance Use: Not on file   Health Literacy: Not on file   Physical Activity: Not on file   Interpersonal Safety: Not on file   Stress: Not on file   Intimate Partner Violence: Not on file   Depression: Not on file   Social Connections: Not on file       Complex Discharge Information    Is patient identified as a difficult/complex discharge?: No          Interventions:       Discharge Needs Assessment  Concerns to be Addressed: discharge planning    Clinical Risk Factors: > 65    Barriers to taking medications: No    Prior overnight hospital stay or ED visit in last 90 days: No          Discharge Facility/Level of Care Needs:      Readmission  Risk of Unplanned Readmission Score: UNPLANNED READMISSION SCORE: 23.6%  Predictive Model Details          24% (High)  Factor Value    Calculated 03/21/2022 08:03 34% Number of active inpatient medication orders 57    [place] Risk of Unplanned Readmission  Model 18% Diagnosis of drug abuse present     8% ECG/EKG order present in last 6 months     6% Restraint order present in last 6 months     6% Imaging order present in last 6 months     5% Latest hemoglobin low (10.4 g/dL)     5% Phosphorous result present     5% Age 1     5% Number of ED visits in last six months 1     4% Active anticoagulant inpatient medication order present     2% Charlson Comorbidity Index 2     2% Future appointment scheduled     1% Current length of stay 1.069 days      Readmitted Within the Last 30 Days? (No if blank)   Patient at risk for readmission?: No    Discharge Plan  Screen findings are: Care Manager reviewed the plan of the patient's care with the Multidisciplinary Team. No discharge planning needs identified at this time. Care Manager will continue to manage plan and monitor patient's progress with the team.    Expected Discharge Date:     Expected Transfer from Critical Care:      Quality data for continuing care services shared with patient and/or representative?: N/A  Patient and/or family were provided with choice of facilities / services that are available and appropriate to meet post hospital care needs?: N/A       Initial Assessment complete?: Yes

## 2022-03-22 LAB — BLOOD GAS CRITICAL CARE PANEL, VENOUS
BASE EXCESS VENOUS: -0.7 (ref -2.0–2.0)
BASE EXCESS VENOUS: 0.9 (ref -2.0–2.0)
CALCIUM IONIZED VENOUS (MG/DL): 5.09 mg/dL (ref 4.40–5.40)
CALCIUM IONIZED VENOUS (MG/DL): 4.83 mg/dL (ref 4.40–5.40)
GLUCOSE WHOLE BLOOD: 136 mg/dL (ref 70–179)
GLUCOSE WHOLE BLOOD: 146 mg/dL (ref 70–179)
HCO3 VENOUS: 23 mmol/L (ref 22–27)
HCO3 VENOUS: 26 mmol/L (ref 22–27)
HEMOGLOBIN BLOOD GAS: 9.7 g/dL — ABNORMAL LOW
HEMOGLOBIN BLOOD GAS: 9.8 g/dL — ABNORMAL LOW
LACTATE BLOOD VENOUS: 0.9 mmol/L (ref 0.5–1.8)
LACTATE BLOOD VENOUS: 1.2 mmol/L (ref 0.5–1.8)
O2 SATURATION VENOUS: 67.1 % (ref 40.0–85.0)
O2 SATURATION VENOUS: 70.2 % (ref 40.0–85.0)
PCO2 VENOUS: 51 mmHg (ref 40–60)
PCO2 VENOUS: 51 mmHg (ref 40–60)
PH VENOUS: 7.31 — ABNORMAL LOW (ref 7.32–7.43)
PH VENOUS: 7.33 (ref 7.32–7.43)
PO2 VENOUS: 41 mmHg (ref 30–55)
PO2 VENOUS: 40 mmHg (ref 30–55)
POTASSIUM WHOLE BLOOD: 4.5 mmol/L (ref 3.4–4.6)
POTASSIUM WHOLE BLOOD: 4.3 mmol/L (ref 3.4–4.6)
SODIUM WHOLE BLOOD: 137 mmol/L (ref 135–145)
SODIUM WHOLE BLOOD: 135 mmol/L (ref 135–145)

## 2022-03-22 LAB — CBC
HEMATOCRIT: 27.9 % — ABNORMAL LOW (ref 39.0–48.0)
HEMOGLOBIN: 9.6 g/dL — ABNORMAL LOW (ref 12.9–16.5)
MEAN CORPUSCULAR HEMOGLOBIN CONC: 34.2 g/dL (ref 32.0–36.0)
MEAN CORPUSCULAR HEMOGLOBIN: 31.7 pg (ref 25.9–32.4)
MEAN CORPUSCULAR VOLUME: 92.7 fL (ref 77.6–95.7)
MEAN PLATELET VOLUME: 8 fL (ref 6.8–10.7)
PLATELET COUNT: 104 10*9/L — ABNORMAL LOW (ref 150–450)
RED BLOOD CELL COUNT: 3.01 10*12/L — ABNORMAL LOW (ref 4.26–5.60)
RED CELL DISTRIBUTION WIDTH: 14 % (ref 12.2–15.2)
WBC ADJUSTED: 8.3 10*9/L (ref 3.6–11.2)

## 2022-03-22 LAB — PHOSPHORUS: PHOSPHORUS: 3 mg/dL (ref 2.4–5.1)

## 2022-03-22 LAB — BASIC METABOLIC PANEL
ANION GAP: 6 mmol/L (ref 5–14)
BLOOD UREA NITROGEN: 15 mg/dL (ref 9–23)
BUN / CREAT RATIO: 16
CALCIUM: 8.9 mg/dL (ref 8.7–10.4)
CHLORIDE: 106 mmol/L (ref 98–107)
CO2: 26 mmol/L (ref 20.0–31.0)
CREATININE: 0.92 mg/dL
EGFR CKD-EPI (2021) MALE: 88 mL/min/{1.73_m2} (ref >=60)
GLUCOSE RANDOM: 141 mg/dL (ref 70–179)
POTASSIUM: 4.5 mmol/L (ref 3.4–4.8)
SODIUM: 138 mmol/L (ref 135–145)

## 2022-03-22 LAB — MAGNESIUM: MAGNESIUM: 1.7 mg/dL (ref 1.6–2.6)

## 2022-03-22 MED ADMIN — heparin (porcine) 5,000 unit/mL injection 5,000 Units: 5000 [IU] | SUBCUTANEOUS | @ 03:00:00

## 2022-03-22 MED ADMIN — docusate sodium (COLACE) capsule 100 mg: 100 mg | ORAL | @ 02:00:00

## 2022-03-22 MED ADMIN — pravastatin (PRAVACHOL) tablet 40 mg: 40 mg | ORAL | @ 16:00:00

## 2022-03-22 MED ADMIN — colchicine (COLCRYS) tablet 0.6 mg: .6 mg | ORAL | @ 16:00:00 | Stop: 2022-03-25

## 2022-03-22 MED ADMIN — oxyCODONE (ROXICODONE) immediate release tablet 2.5 mg: 2.5 mg | ORAL | @ 17:00:00 | Stop: 2022-04-03

## 2022-03-22 MED ADMIN — acetaminophen (TYLENOL) tablet 1,000 mg: 1000 mg | ORAL | @ 12:00:00

## 2022-03-22 MED ADMIN — magnesium oxide (MAG-OX) tablet 400 mg: 400 mg | ORAL | @ 16:00:00

## 2022-03-22 MED ADMIN — docusate sodium (COLACE) capsule 100 mg: 100 mg | ORAL | @ 16:00:00

## 2022-03-22 MED ADMIN — gabapentin (NEURONTIN) capsule 300 mg: 300 mg | ORAL | @ 16:00:00

## 2022-03-22 MED ADMIN — aspirin chewable tablet 81 mg: 81 mg | ORAL | @ 16:00:00

## 2022-03-22 MED ADMIN — mupirocin (BACTROBAN) 2 % ointment: NASAL | @ 02:00:00 | Stop: 2022-03-25

## 2022-03-22 MED ADMIN — sertraline (ZOLOFT) tablet 100 mg: 100 mg | ORAL | @ 16:00:00

## 2022-03-22 MED ADMIN — magnesium oxide (MAG-OX) tablet 400 mg: 400 mg | ORAL | @ 01:00:00

## 2022-03-22 MED ADMIN — heparin (porcine) 5,000 unit/mL injection 5,000 Units: 5000 [IU] | SUBCUTANEOUS | @ 21:00:00

## 2022-03-22 MED ADMIN — insulin lispro (HumaLOG) injection 0-9 Units: 0-9 [IU] | SUBCUTANEOUS | @ 22:00:00

## 2022-03-22 MED ADMIN — colchicine (COLCRYS) tablet 0.6 mg: .6 mg | ORAL | @ 01:00:00 | Stop: 2022-03-25

## 2022-03-22 MED ADMIN — traZODone (DESYREL) tablet 150 mg: 150 mg | ORAL | @ 02:00:00

## 2022-03-22 MED ADMIN — clopidogreL (PLAVIX) tablet 75 mg: 75 mg | ORAL | @ 16:00:00

## 2022-03-22 MED ADMIN — acetaminophen (TYLENOL) tablet 1,000 mg: 1000 mg | ORAL | @ 16:00:00

## 2022-03-22 MED ADMIN — gabapentin (NEURONTIN) capsule 300 mg: 300 mg | ORAL | @ 01:00:00

## 2022-03-22 MED ADMIN — metoPROLOL tartrate (LOPRESSOR) tablet 12.5 mg: 12.5 mg | ORAL | @ 21:00:00

## 2022-03-22 MED ADMIN — magnesium sulfate in water 2 gram/50 mL (4 %) IVPB 2 g: 2 g | INTRAVENOUS | @ 09:00:00

## 2022-03-22 MED ADMIN — acetaminophen (TYLENOL) tablet 1,000 mg: 1000 mg | ORAL | @ 01:00:00

## 2022-03-22 MED ADMIN — insulin lispro (HumaLOG) injection 0-9 Units: 0-9 [IU] | SUBCUTANEOUS | @ 16:00:00 | Stop: 2022-03-22

## 2022-03-22 MED ADMIN — heparin (porcine) 5,000 unit/mL injection 5,000 Units: 5000 [IU] | SUBCUTANEOUS | @ 12:00:00

## 2022-03-22 MED ADMIN — oxyCODONE (ROXICODONE) immediate release tablet 5 mg: 5 mg | ORAL | @ 09:00:00 | Stop: 2022-04-03

## 2022-03-22 MED ADMIN — polyethylene glycol (MIRALAX) packet 17 g: 17 g | ORAL | @ 22:00:00

## 2022-03-22 MED ADMIN — oxyCODONE (ROXICODONE) immediate release tablet 5 mg: 5 mg | ORAL | @ 16:00:00 | Stop: 2022-04-03

## 2022-03-22 MED ADMIN — insulin NPH (HumuLIN,NovoLIN) injection 10 Units: 10 [IU] | SUBCUTANEOUS | @ 16:00:00

## 2022-03-22 MED ADMIN — tamsulosin (FLOMAX) 24 hr capsule 0.4 mg: .4 mg | ORAL | @ 02:00:00

## 2022-03-22 MED ADMIN — clopidogreL (PLAVIX) tablet 75 mg: 75 mg | ORAL | @ 01:00:00

## 2022-03-22 MED ADMIN — insulin NPH (HumuLIN,NovoLIN) injection 10 Units: 10 [IU] | SUBCUTANEOUS | @ 03:00:00

## 2022-03-22 MED ADMIN — mupirocin (BACTROBAN) 2 % ointment: NASAL | @ 16:00:00 | Stop: 2022-03-25

## 2022-03-22 MED ADMIN — naloxegoL (MOVANTIK) tablet 25 mg: 25 mg | ORAL | @ 16:00:00 | Stop: 2022-03-26

## 2022-03-22 MED ADMIN — lidocaine 4 % patch 3 patch: 3 | TRANSDERMAL | @ 16:00:00

## 2022-03-22 MED ADMIN — polyethylene glycol (MIRALAX) packet 17 g: 17 g | ORAL | @ 16:00:00

## 2022-03-22 MED ADMIN — fentaNYL (PF) (SUBLIMAZE) injection 25 mcg: 25 ug | INTRAVENOUS | @ 09:00:00 | Stop: 2022-03-22

## 2022-03-22 MED ADMIN — furosemide (LASIX) injection 20 mg: 20 mg | INTRAVENOUS | @ 16:00:00 | Stop: 2022-03-22

## 2022-03-22 NOTE — Unmapped (Signed)
OCCUPATIONAL THERAPY  Evaluation (03/22/22 0827)    Patient Name:  Christopher [name]       Medical Record Number: [id number]   Date of Birth: 1950/08/25  Sex: Male            OT Treatment Diagnosis:  Decreased independence with ADL/iADL    Assessment  Problem List: Impaired balance, Pain, Decreased endurance, Shortness of breath, Decreased mobility, Fall risk, Impaired ADLs  Personal Factors/Comorbidities (Occupational Profile and History Review): Expanded (Moderate)  Assessment of Occupational Performance : Balance, Cognitive skills, Dexterity, Endurance, Fine or gross motor coordination, Mobility, Strength, Sensation  Clinical Decision Making: Moderate Complexity    Assessment: Pt is a 71 y/o M admitted with PMH of angina, CAD (LAD and OM), HLD, T2DM (HgbA1C 7.9), HTN, statin intolerance, TIA. On 11/27 underwent 11/27 CABG x2 (LIMA to LAD, RSVG to OM). Pt presents to acute OT evaluation with post op pain and impairments in endurance and balance hindering ADL and functional mobility performance and participation. Pt requiring grossly CGA-min A for standing ADL and functional mobility with the use of a RW. Pt largely limited by fatigue with questionable desat to 80s with activity, good recovery with rest break. Pt will benefit from continued OT intervention to maximize safety and independence with his daily routine. Recommending low intensity, post acute rehab at this time, however pt has potential to progress to home pending further acute OT intervention.     Review of pt's occupational profile, client history, assessment of occupational performance, clinical decision making and development of POC required moderate complexity OT evaluation.     Today's Interventions: ADL retraining, Balance activities, Conservation, Education - Patient, Endurance activities, Functional mobility, Positioning, Safety education, Transfer training    Today's Interventions: Pt engaged in standing g/h, safety with assistive device management, adherence to modified sternal precautions, energy conservation/PLB, and functional mobility/household transfer training.     Activity Tolerance During Today's Session  Tolerated treatment well, Limited by fatigue, Limited secondary to medical complications    Plan  Planned Frequency of Treatment:  1-2x per day for: 3-4x week  Planned Treatment Duration: 04/05/22    Planned Interventions:  ADL retraining, Balance activities, Bed mobility, Compensatory tech. training, Conservation, Education - Patient, Education - Family / caregiver, Endurance activities, Functional mobility, Field seismologist education, Actor Occupational Therapy Recommendations:   Skilled OT services indicated, 5x weekly, Low intensity (potential to progress to 3x/home)   OT DME Recommendations: Three in one commode -        GOALS:   Patient and Family Goals: Return to PLOF    IP Long Term Goal #1: Pt will be mod I with all ADL/iADL in 6 months.     Short Term:  SHORT GOAL #1: Pt will perform UB and LB dressing with mod I.   Time Frame : 2 weeks  SHORT GOAL #2: Pt will perform toileting routine including clothing management and hygiene with mod I using LRAD.   Time Frame : 2 weeks  SHORT GOAL #3: Pt will perform standing g/h routine for 5 minutes with mod I   Time Frame : 2 weeks     Prognosis:  Good  Positive Indicators:  High PLOF, motivated  Barriers to Discharge: Endurance deficits, Impaired Balance, Inability to safely perform ADLS, Functional strength deficits, Age, Gait instability    Subjective  Current Status Pt received and returned to recliner chair with all needs met and lines/leads intact. RN aware  Prior Functional Status  Independent ADLs and mobility w/o AD. Works full time in [employer].    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    Corneal abrasion 1975    steel in right eye with abrasion and removal    Cubital tunnel syndrome on left 01/16/2019    Depression 2016    Diabetes mellitus (CMS-HCC)     GERD (gastroesophageal reflux disease) 1982    Hypertension     Meningitis 12/25/2015    Mixed hyperlipidemia     Myocardial infarction (CMS-HCC) 1986    Obesity 1986    first back surgery    Peptic ulceration     Social History     Tobacco Use    Smoking status: Never    Smokeless tobacco: Never    Tobacco comments:     never will   Substance Use Topics    Alcohol use: No     Alcohol/week: 0.0 standard drinks of alcohol      Past Surgical History:   Procedure Laterality Date    BACK SURGERY      CARDIAC CATHETERIZATION      STENTS PLACEMENT     CARDIAC SURGERY  2005    stents    CORONARY STENT PLACEMENT      FOREIGN BODY REMOVAL Right about 20 years ago    HERNIA REPAIR      RIGHT INGUNIAL    JOINT REPLACEMENT Left     TOTAL SHOULDER    PR ARM/ELBOW TENDON LENGTHEN,SINGLE,EA Left 02/17/2019    Procedure: TENDON LENGTHENING UPPER ARM/ELBOW SNGL EA;  Surgeon: Daisy Lazar, MD;  Location: ASC OR [facility];  Service: Orthopedics    PR CABG, ARTERIAL, SINGLE Midline 03/20/2022    Procedure: CORONARY ARTERY BYPASS, USING ARTERIAL GRAFT(S); SINGLE ARTERIAL GRAFT;  Surgeon: Arlester Marker, MD;  Location: MAIN OR [facility];  Service: Cardiac Surgery    PR CABG, ARTERY-VEIN, SINGLE Midline 03/20/2022    Procedure: CORONARY ARTERY BYPASS, USING VENOUS GRAFT(S) AND ARTERIAL GRAFT(S); SINGLE VEIN GRAFT;  Surgeon: Arlester Marker, MD;  Location: MAIN OR [facility];  Service: Cardiac Surgery    PR CATH PLACE/CORON ANGIO, IMG SUPER/INTERP,W LEFT HEART VENTRICULOGRAPHY N/A 02/01/2022    Procedure: Left Heart Catheterization;  Surgeon: Jacquelyne Balint, MD;  Location: [facility] CATH;  Service: Cardiology    PR ENDOSCOPY W/VIDEO-ASST VEIN HARVEST,CABG Right 03/20/2022    Procedure: ENDOSCOPY, SURGICAL, INCLUDING VIDEO-ASSISTED HARVEST OF VEIN(S) FOR CORONARY ARTERY BYPASS PROCEDURE;  Surgeon: Arlester Marker, MD;  Location: MAIN OR [facility];  Service: Cardiac Surgery    PR OSTEOTOMY 1ST METATARSAL,BASE/SHAFT Left 07/20/2017    Procedure: OSTEOTOMY, W/WO LENGTHENING, SHORTENING OR ANGULAR CORRECTION, METATARSAL; FIRST METATARSAL;  Surgeon: Valda Favia, MD;  Location: [facility] OR [facility];  Service: Ortho Foot & Ankle    PR OSTEOTOMY HEEL BONE Left 07/20/2017    Procedure: OSTEOTOMYLoistine Chance W/WO INT FIXA;  Surgeon: Valda Favia, MD;  Location: ASC OR [facility];  Service: Ortho Foot & Ankle    PR OSTEOTOMY METATARSAL (NOT 1ST)  Left 06/06/2018    Procedure: OSTEOTOMY, W/WO LENGTHENING, SHORTENING OR ANGULAR CORRECTION, METATARSAL; OTHER THAN FIRST METATARSAL, EA;  Surgeon: Valda Favia, MD;  Location: ASC OR [facility];  Service: Orthopedics    PR PART REMV PHALANX OF TOE Left 06/06/2018    Procedure: PART EXC BONE PHALANX TOE;  Surgeon: Valda Favia, MD;  Location: ASC OR [facility];  Service: Orthopedics    PR RECONSTR TOTAL SHOULDER IMPLANT Left 11/12/2018    Procedure: R28   ARTHROPLASTY, GLENOHUMERAL JOINT; TOTAL SHOULDER(GLENOID & PROXIMAL HUMERAL REPLACEMENT(EG, TOTAL SHOULDER);  Surgeon: Nickola Major, MD;  [facility]: [facility] OR [facility];  Service: Orthopedics    PR REMOVAL DEEP IMPLANT Left 11/12/2018    Procedure: REMOVE IMPLANT; DEEP SHOULDER;  Surgeon: Nickola Major, MD;  Location: [facility] OR [facility];  Service: Orthopedics    PR REPAIR BICEPS LONG TENDON Left 11/12/2018    Procedure: TENODESIS LONG TENDON BICEPS;  Surgeon: Nickola Major, MD;  [facility] OR [facility];  Service: Orthopedics    PR REPAIR PERONEAL TENDONS,FIB OSTEOTMY Left 07/20/2017    Procedure: REPR DISLOC PERONEAL TENDONS; W/FIB OSTEOTOMY;  Surgeon: Valda Favia, MD;  Location: ASC OR [facility];  Service: Ortho Foot & Ankle    PR REVISE MEDIAN N/CARPAL TUNNEL SURG Left 02/17/2019    Procedure: R22 NEUROPLASTY AND/OR TRANSPOSITION; MEDIAN NERVE AT CARPAL TUNNEL;  Surgeon: Theodora Blow Jacqlyn Krauss, MD;  Location: ASC OR [facility];  Service: Orthopedics    PR REVISE ULNAR NERVE AT ELBOW Left 02/17/2019    Procedure: NEUROPLASTY AND/OR TRANSPOSITION; ULNAR NERVE AT ELBOW;  Surgeon: Daisy Lazar, MD;  Location: ASC OR [facility];  Service: Orthopedics    SHOULDER SURGERY Bilateral     L TSA, R ROTATOR CUFF      SKIN BIOPSY      SPINE SURGERY  1986/1995/2000/2002    LUMBAR FUSION    stent      Family History   Problem Relation Age of Onset    Diabetes Mother     Stroke Mother     Cataracts Mother     Hypertension Mother     Alcohol abuse Father     Cancer Father     Diabetes Father     Cataracts Father     Hypertension Father     Melanoma Father     Hypertension Maternal Grandmother     Cancer Maternal Grandmother     Hypertension Maternal Grandfather     Hypertension Paternal Grandmother     Cataracts Paternal Grandfather     Hypertension Paternal Grandfather     COPD Brother         moker and alcholic    Early death Brother     COPD Sister         was a smoker    Diabetes Sister     COPD Brother         moker    Diabetes Brother     Diabetes Sister     Kidney disease Maternal Aunt         died from kidney diease due to diabeties    No Known Problems Maternal Uncle     No Known Problems Paternal Aunt     No Known Problems Paternal Uncle     No Known Problems Other     Blindness Neg Hx     Thyroid disease Neg Hx     Macular degeneration Neg Hx     Glaucoma  Neg Hx     Basal cell carcinoma Neg Hx     Squamous cell carcinoma Neg Hx     Anesthesia problems Neg Hx     Broken bones Neg Hx     Clotting disorder Neg Hx     Collagen disease Neg Hx     Dislocations Neg Hx     Fibromyalgia Neg Hx     Gout Neg Hx     Hemophilia Neg Hx     Osteoporosis Neg Hx Rheumatologic disease Neg Hx     Scoliosis Neg Hx     Severe sprains Neg Hx     Sickle cell anemia Neg Hx     Spinal Compression Fracture Neg Hx         Morphine, Atorvastatin, Atorvastatin calcium, Baclofen, Ibuprofen, Pneumococcal 23-valent polysaccharide vaccine, and Zetia [ezetimibe]     Objective Findings  Precautions / Restrictions  Falls precautions, Modified sternal precautions       Weight Bearing  Non-applicable    Required Braces or Orthoses  Non-applicable    Communication Preference  Verbal    Pain  Pt reporting 3/10 pain at surgical site, no complaints at end of session    Equipment / Environment  Chest Tube, Foley, Supplemental oxygen, Telemetry, Vascular access (PIV, TLC, Port-a-cath, PICC)    Living Situation  Living Environment: Physiological scientist  Lives With: Spouse (daughter in law)  Home Living: One level home, Walk-in shower, Raised toilet seat without rails, Ramped entrance, Shower chair with back, Standard height toilet  Rail placement (outside): Bilateral rails  Number of Stairs to Psychologist, sport and exercise (outside): 7  Equipment available at home: Paediatric nurse with back, Bedside Commode     Cognition   Orientation Level:  Oriented x 4   Arousal/Alertness:  Appropriate responses to stimuli   Attention Span:  Appears intact   Memory:  Appears intact   Following Commands:  Follows all commands and directions without difficulty   Safety Judgment:  Good awareness of safety precautions   Awareness of Errors:  Good awareness of errors made   Problem Solving:  Able to problem solve independently   Comments:      Vision / Hearing   Vision: No acute deficits identified     Hearing: No deficit identified       Hand Function:  Right Hand Function: Right hand grip strength, ROM and coordination WNL  Left Hand Function: Left hand grip strength, ROM and coordination WNL  Hand Function comments: Functionally intact with grooming tasks  Hand Dominance: Right    Skin Inspection:  Skin Inspection: Intact where visualized, Incision C/D/I, Dressing C/D/I    ROM / Strength:  UE ROM/Strength: Left WFL, Right WFL  LE ROM/Strength: Left WFL, Right WFL    Coordination:  Coordination: WFL    Sensation:  RUE Sensation: RUE intact  LUE Sensation: LUE intact  RLE Sensation: RLE intact  LLE Sensation: LLE intact    Balance:  Static Sitting-Balance Support: Bilateral upper extremity supported, Feet supported  Static Sitting-Level of Assistance: Distant supervision  Dynamic Sitting-Balance Support: Feet supported  Dynamic Sitting-Balance: Forward lean, Reaching across midline  Dynamic Sitting-Level of Assistance: Close supervision    Static Standing-Balance Support: Bilateral upper extremity supported  Static Standing-Level of Assistance: Contact guard  Dynamic Standing-Balance Support: No upper extremity supported  Dynamic Standing-Balance: Reaching across midline, Forward lean  Dynamic Standing - Level of Assistance: Contact guard, Minimum assistance  Standing Balance comments: CGA for static balance with UE support (RW), CGA-min  A for dynamic standing balance w/o UE support while performing standing grooming    Functional Mobility  Transfer Assistance Needed: Yes  Transfers - Needs Assistance: Min assist (sit>stand from recliner chair with min A using RW, stand>sit with CGA)  Bed Mobility Assistance Needed:  (Not assessed)  Ambulation: Pt performing ~20 feet using RW with CGA    ADLs  ADLs: Needs assistance with ADLs  ADLs - Needs Assistance: Grooming, Toileting, UB dressing, Bathing, LB dressing  Grooming - Needs Assistance: Contact Guard assist (in standing)  Bathing - Needs Assistance:  (not assessed, anticipate mod A given current limitations)  Toileting - Needs Assistance:  (not assessed, anticiapte min-mod A given current limitations)  UB Dressing - Needs Assistance:  (anticpiate min A given pain and current limitations)  LB Dressing - Needs Assistance:  (anticipate mod A given pain and current limitations)  IADLs: NT    AM-PAC-Daily Activity  Lower Body Dressing assistance needs: A lot - Maximum/Moderate Assistance  Bathing assistance needs: A lot - Maximum/Moderate Assistance  Toileting assistance needs: A Little - Minimal/Contact Guard Assist/Supervision  Upper Body Dressing assistance needs: A Little - Minimal/Contact Guard Assist/Supervision  Personal Grooming assistance needs: A Little - Minimal/Contact Guard Assist/Supervision  Eating Meals assistance needs: None - Modified Independent/Independent    Daily Activity Score:  Daily Activity Score: 17    Score (in points): % of Functional Impairment, Limitation, Restriction  6: 100% impaired, limited, restricted  7-8: At least 80%, but less than 100% impaired, limited restricted  9-13: At least 60%, but less than 80% impaired, limited restricted  14-19: At least 40%, but less than 60% impaired, limited restricted  20-22: At least 20%, but less than 40% impaired, limited restricted  23: At least 1%, but less than 20% impaired, limited restricted  24: 0% impaired, limited restricted     Vitals / Orthostatics  Vitals/Orthostatics: Pt on 3L via [place], occasional desat to mid 80s with questionable pleth with standing activity. Recovering to 90s with seated/standing rest break.    Medical Staff Made Aware: RN    Occupational Therapy Session Duration  OT Individual [mins]: 38         I attest that I have reviewed the above information.  Signed: Lorin Glass, OT  Filed 03/22/2022    As a part of my new employee training, I have completed the therapy assessment, intervention and documentation for the patient stated above under the direction and guidance of Cindie Laroche, OT.

## 2022-03-22 NOTE — Unmapped (Signed)
Patient is alert and oriented x4. Follows commands and moves all extremities appropriately. Afebrile. NSR. Hemodynamically stable, MAP > 65. Pulses palpable. Tolerating 4L [place], with Sp02 > 93%. Regular diet with diminished appetite.     Family at the bedside and updated on plan of care.       Problem: Non-Violent Restraints  Goal: Patient will remain free of restraint events  Outcome: Ongoing - Unchanged  Goal: Patient will remain free of physical injury  Outcome: Ongoing - Unchanged     Problem: Adult Inpatient Plan of Care  Goal: Plan of Care Review  Outcome: Ongoing - Unchanged  Goal: Patient-Specific Goal (Individualized)  Outcome: Ongoing - Unchanged  Goal: Absence of Hospital-Acquired Illness or Injury  Outcome: Ongoing - Unchanged  Intervention: Identify and Manage Fall Risk  Recent Flowsheet Documentation  Taken 03/21/2022 1500 by Gillian Shields I, RN  Safety Interventions: aspiration precautions  Intervention: Prevent Skin Injury  Recent Flowsheet Documentation  Taken 03/21/2022 1600 by Gillian Shields I, RN  Positioning for Skin: Right  Taken 03/21/2022 1500 by Gillian Shields I, RN  Positioning for Skin: Left  Device Skin Pressure Protection: absorbent pad utilized/changed  Skin Protection: incontinence pads utilized  Intervention: Prevent Infection  Recent Flowsheet Documentation  Taken 03/21/2022 1500 by Gillian Shields I, RN  Infection Prevention: environmental surveillance performed  Goal: Optimal Comfort and Wellbeing  Outcome: Ongoing - Unchanged  Goal: Readiness for Transition of Care  Outcome: Ongoing - Unchanged  Goal: Rounds/Family Conference  Outcome: Ongoing - Unchanged     Problem: Wound  Goal: Optimal Coping  Outcome: Ongoing - Unchanged  Goal: Optimal Functional Ability  Outcome: Ongoing - Unchanged  Intervention: Optimize Functional Ability  Recent Flowsheet Documentation  Taken 03/21/2022 1500 by Gillian Shields I, RN  Activity Management: up in chair  Goal: Absence of Infection Signs and Symptoms  Outcome: Ongoing - Unchanged  Intervention: Prevent or Manage Infection  Recent Flowsheet Documentation  Taken 03/21/2022 1500 by Gillian Shields I, RN  Infection Management: aseptic technique maintained  Goal: Improved Oral Intake  Outcome: Ongoing - Unchanged  Goal: Optimal Pain Control and Function  Outcome: Ongoing - Unchanged  Goal: Skin Health and Integrity  Outcome: Ongoing - Unchanged  Intervention: Optimize Skin Protection  Recent Flowsheet Documentation  Taken 03/21/2022 1600 by Gillian Shields I, RN  Head of Bed [facility]) Positioning: HOB at 30-45 degrees  Taken 03/21/2022 1500 by Gillian Shields I, RN  Activity Management: up in chair  Pressure Reduction Techniques:   frequent weight shift encouraged   heels elevated off bed   positioned off wounds  Head of Bed (HOB) Positioning: HOB at 30-45 degrees  Pressure Reduction Devices:   heel offloading device utilized   positioning supports utilized   pressure-redistributing mattress utilized  Skin Protection: incontinence pads utilized  Goal: Optimal Wound Healing  Outcome: Ongoing - Unchanged     Problem: Skin Injury Risk Increased  Goal: Skin Health and Integrity  Outcome: Ongoing - Unchanged  Intervention: Optimize Skin Protection  Recent Flowsheet Documentation  Taken 03/21/2022 1600 by Gillian Shields I, RN  Head of Bed [facility]) Positioning: HOB at 30-45 degrees  Taken 03/21/2022 1500 by Gillian Shields I, RN  Activity Management: up in chair  Pressure Reduction Techniques:   frequent weight shift encouraged   heels elevated off bed   positioned off wounds  Head of Bed (HOB) Positioning: HOB at 30-45 degrees  Pressure Reduction Devices:   heel offloading device utilized   positioning supports utilized  pressure-redistributing mattress utilized  Skin Protection: incontinence pads utilized     Problem: Fall Injury Risk  Goal: Absence of Fall and Fall-Related Injury  Outcome: Ongoing - Unchanged  Intervention: Promote Scientist, clinical (histocompatibility and immunogenetics) Documentation  Taken 03/21/2022 1500 by Gillian Shields I, RN  Safety Interventions: aspiration precautions     Problem: Self-Care Deficit  Goal: Improved Ability to Complete Activities of Daily Living  Outcome: Ongoing - Unchanged     Problem: Mechanical Ventilation Invasive  Goal: Effective Communication  Outcome: Ongoing - Unchanged  Goal: Optimal Device Function  Outcome: Ongoing - Unchanged  Intervention: Optimize Device Care and Function  Recent Flowsheet Documentation  Taken 03/21/2022 1600 by Gillian Shields I, RN  Oral Care: oral rinse provided  Taken 03/21/2022 1500 by Gillian Shields I, RN  Oral Care: oral rinse provided  Goal: Mechanical Ventilation Liberation  Outcome: Ongoing - Unchanged  Goal: Optimal Nutrition Delivery  Outcome: Ongoing - Unchanged  Goal: Absence of Device-Related Skin and Tissue Injury  Outcome: Ongoing - Unchanged  Intervention: Maintain Skin and Tissue Health  Recent Flowsheet Documentation  Taken 03/21/2022 1500 by Gillian Shields I, RN  Device Skin Pressure Protection: absorbent pad utilized/changed  Goal: Absence of Ventilator-Induced Lung Injury  Outcome: Ongoing - Unchanged  Intervention: Prevent Ventilator-Associated Pneumonia  Recent Flowsheet Documentation  Taken 03/21/2022 1600 by Gillian Shields I, RN  Head of Bed [facility]) Positioning: HOB at 30-45 degrees  Oral Care: oral rinse provided  Taken 03/21/2022 1500 by Gillian Shields I, RN  Head of Bed [facility]) Positioning: HOB at 30-45 degrees  Oral Care: oral rinse provided

## 2022-03-22 NOTE — Unmapped (Signed)
CVT ICU ADDITIONAL CRITICAL CARE NOTE     Date of Service: 03/21/2022    Hospital Day: LOS: 1 day        Surgery Date: 11/27  Surgical Attending: Arlester Marker, MD    Critical Care Attending: Gordy Clement, MD    Interval History:   Off endotool. Blood gases stable.     History of Present Illness:   Christopher [name] is a 71 y.o. male w/ angina, CAD (LAD and OM), HLD, T2DM (HgbA1C 7.9), HTN, statin intolerance, TIA     Hospital Course:  11/27 CABG x2 (LIMA to LAD, RSVG to OM), no TEE due to esophageal stricture     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 *     ASSESSMENT & PLAN:     Cardiovascular: Postoperative state from CABG  - No TEE done 2/2 esophageal stricture. Normal EF on TTE.  - Intraoperative blood products: 600 mL cell saver returned, 340 autologous blood returned  - Goal MAP 65-12mmHg, CI >2.2, SvO2 60-80  - Mediastinal tubes to -20 cm suction, monitor output  - Epicardial pacing wires: A's only  - continue ASA, statin, plavix    Respiratory:     - Goal SpO2 >92%, wean oxygen as tolerated  - Aggressive pulmonary hygiene, encourage IS  - Pleural drains to -20cm suction, monitor output    Neurologic: Post-operative pain  - Pain: Fentanyl PRN, oxy PRN   - holding home gabapentin d/t somnolence  - Frailty score:     - Frail (3-5)   - Pre-frail (1-2)   - Robust (0)    Renal/Genitourinary:  NAI  - Replete electrolytes PRN  - Strict I&O  - continue home flomax    Gastrointestinal: Gastrointestinal bleed  - Diet: Reg  - Bowel regimen: miralax, colace    Endocrine:  T2DM  - Glycemic Control: subcutaneous insulin    Hematologic: Acute blood loss anemia secondary to cardiac surgery    - Monitor CBC daily, transfuse products as indicated  - Monitor chest tube output  - SQH for DVT ppx    Immunologic/Infectious Disease:  - Afebrile, leukocytosis likely secondary to post-operative inflammatory state, continue to monitor    Integumentary  - Skin bundle discussed on AM rounds? Yes  - Pressure injury present on admission to CVTICU? No  - Is CWOCN consulted? No  - Are any CWOCN verified pressure injuries present since admission to CVTICU? No    Mobility   Mobility level 1. HOB elevated to 30* for 1 hour (2x per shift)   PT/OT consulted Yes.    Daily Care Checklist:   - Stress Ulcer Prevention: No  - DVT Prophylaxis: Chemical: SQ Heparin and Mechanical: Yes.  - HOB >30 degrees: Yes   - Daily Awakening: N/A  - Spontaneous Breathing Trial: N/A  - Indication for Beta Blockade: No  -  Indication for Central/PICC Line: Yes  Infusions requiring central access, Hemodynamic monitoring, and Aggressive fluid resuscitation  - Indication for Urinary Catheter: Yes  Strict intake and output, Critically ill, and Agressive diuresis/hydration  - Diagnostic images/reports of past 24hrs reviewed: Yes    Disposition:   - Continue ICU care    Christopher [name] is critically ill due to: Postoperative state from CABG, lactic acidosis, postoperative mechanical ventilation, hypotension, surgical pain  This critical care time includes examining the patient, evaluating the hemodynamic, laboratory, and radiographic data, independently developing a comprehensive management plan, and serially assessing the patient's response to these critical care interventions. This critical care time excludes procedures.    Critical care time: 40 minutes     Clydell Hakim, PA   Cardiovascular and Thoracic Intensive Care Unit  Department of Anesthesiology  [facility]     SUBJECTIVE:      Lying in bed, interactive, in NAD.       OBJECTIVE:     Physical Exam:  Constitutional: M appearing stated age, NAD  Neurologic: Alert, oriented, PERRL, no focal deficits  Respiratory: Nonlabored on [place]  Cardiovascular: RRR, minimal edema  Gastrointestinal:  distended but compressible, nontender  Musculoskeletal: MAE to command  Skin: Warm, dry  Sternotomy incision: Primary OR dressing c/d/i    Temp:  [36.8 ??C (98.2 ??F)] 36.8 ??C (98.2 ??F)  Core Temp:  [37 ??C (98.6 ??F)-38.2 ??C (100.8 ??F)] 37 ??C (98.6 ??F)  Heart Rate:  [65-80] 70  SpO2 Pulse:  [65-81] 70  Resp:  [5-23] 23  BP: (96-128)/(45-79) 128/70  MAP (mmHg):  [58-89] 89  A BP-1: (77-141)/(47-121) 141/71  MAP:  [59 mmHg-131 mmHg] 95 mmHg  FiO2 (%):  [40 %] 40 %  SpO2:  [93 %-99 %] 97 %  CVP:  [4 mmHg-282 mmHg] 19 mmHg    Recent Laboratory Results:  Recent Labs   Lab Units 03/21/22  1500   PH ART  7.28*   PCO2 ART mm[Hg] 45.9*   PO2 ART mm[Hg] 84   HCO3 ART mmol/L 21.8*   BASE EXC ART mmol/L -4.9*   O2 SAT ART % 96.2     Recent Labs   Lab Units 03/21/22  1500 03/21/22  1457 03/21/22  1125 03/21/22  0640 03/21/22  0335 03/20/22  2106 03/20/22  2033   SODIUM WHOLE BLOOD mmol/L 137  --  140   < > 134*   < > 132*   SODIUM mmol/L  --  137  --   --  138  --  139   POTASSIUM WHOLE BLOOD mmol/L 4.6  --  4.3   < > 4.9*   < > 7.8*   POTASSIUM mmol/L  --  4.9*  --   --  5.2*   < > 7.3*   CHLORIDE mmol/L  --  109*  --   --  109*  --  109*   CO2 mmol/L  --  24.0  --   --  22.0  --  24.0   BUN mg/dL  --  17  --   --  15  --  14   CREATININE mg/dL  --  1.61  --   --  0.96  --  0.91   GLUCOSE mg/dL  --  045  --   --  409*  --  197*    < > = values in this interval not displayed.     Lab Results  Component Value Date    BILITOT 0.6 03/20/2022    BILITOT 0.4 03/01/2022    BILIDIR 0.20 03/20/2022    ALT 12 03/20/2022    ALT 14 03/01/2022    AST 31 03/20/2022    AST 21 03/01/2022    ALKPHOS 41 (L) 03/20/2022    ALKPHOS 61 03/01/2022    PROT 5.8 03/20/2022    PROT 7.7 03/01/2022    ALBUMIN 3.5 03/20/2022    ALBUMIN 4.1 03/01/2022     Recent Labs   Lab Units 03/21/22  1856 03/21/22  1658 03/21/22  1544 03/21/22  1420   POC GLUCOSE mg/dL 161 096 045 409     Recent Labs   Lab Units 03/21/22  1500 03/21/22  0335 03/20/22  1733 03/20/22  1340   WBC 10*9/L  --  9.8  --  15.5*   RBC 10*12/L  --  3.31*  --  3.74*   HEMOGLOBIN g/dL  --  81.1*  --  91.4*   HEMOGLOBIN BG g/dL 9.4*  --  78.2*  --    HEMATOCRIT %  --  31.1*  --  34.4*   MCV fL  --  93.9  --  92.0   MCH pg  --  31.5  --  30.9   MCHC g/dL  --  95.6  --  21.3   RDW %  --  13.6  --  13.5   PLATELET COUNT (1) 10*9/L  --  128*  --  146*   MPV fL  --  7.9  --  7.7     Recent Labs   Lab Units 03/21/22  0335   INR  1.09      Lines & Tubes:   Patient Lines/Drains/Airways Status       Active Peripheral & Central Intravenous Access       Name Placement date Placement time Site Days    Peripheral IV 03/20/22 Left Hand 03/20/22  0710  Hand  1    CVC Triple Lumen 03/20/22 Right Internal jugular 03/20/22  1300  Internal jugular  1    CVC MAC Introducer 03/20/22 Right Internal jugular 03/20/22  1300  Internal jugular  1                  Urethral Catheter Non-latex;Straight-tip;Temperature probe 16 Fr. (Active)   Output (mL) 150 mL 03/20/22 1335   Number of days: 0     Patient Lines/Drains/Airways Status       Active Wounds       Name Placement date Placement time Site Days    Surgical Site 03/20/22 Chest Mid 03/20/22  1000  -- 1    Surgical Site 03/20/22 Leg Right 03/20/22  1233  -- 1                     Respiratory/ventilator settings for last 24 hours:   Vent Mode: PSV-CPAP  FiO2 (%): 40 %  S RR: 16  S VT: 440 mL  PEEP: 5 cm H20  PR SUP: 5 cm H20    Intake/Output last 3 shifts:  I/O last 3 completed shifts:  In: 5784.3 [I.V.:2983.4; Blood:940; IV Piggyback:1860.8]  Out: 4015 [Urine:2565; Emesis/NG output:100; Blood:200; Chest Tube:1150]    Daily/Recent Weight:  90.4 kg (199 lb 4.7 oz)    BMI:  Body mass index is 32.18 kg/m??.  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 01/16/2019    Depression 2016    Diabetes mellitus (CMS-HCC)     GERD (gastroesophageal reflux disease) 1982    Hypertension     Meningitis 12/25/2015    Mixed hyperlipidemia     Myocardial infarction (CMS-HCC) 1986    Obesity 1986    first back surgery    Peptic ulceration      Past Surgical History:   Procedure Laterality Date    BACK SURGERY      CARDIAC CATHETERIZATION      STENTS PLACEMENT     CARDIAC SURGERY  2005    stents    CORONARY STENT PLACEMENT      FOREIGN BODY REMOVAL Right about 20 years ago    HERNIA REPAIR      RIGHT INGUNIAL    JOINT REPLACEMENT Left     TOTAL SHOULDER    PR ARM/ELBOW TENDON LENGTHEN,SINGLE,EA Left 02/17/2019    Procedure: TENDON LENGTHENING UPPER ARM/ELBOW SNGL EA;  Surgeon: Daisy Lazar, MD;  Location: ASC OR [facility];  Service: Orthopedics    PR CABG, ARTERIAL, SINGLE Midline 03/20/2022    Procedure: CORONARY ARTERY BYPASS, USING ARTERIAL GRAFT(S); SINGLE ARTERIAL GRAFT;  Surgeon: Arlester Marker, MD;  Location: MAIN OR [facility];  Service: Cardiac Surgery    PR CABG, ARTERY-VEIN, SINGLE Midline 03/20/2022    Procedure: CORONARY ARTERY BYPASS, USING VENOUS GRAFT(S) AND ARTERIAL GRAFT(S); SINGLE VEIN GRAFT;  Surgeon: Arlester Marker, MD;  Location: MAIN OR [facility];  Service: Cardiac Surgery    PR CATH PLACE/CORON ANGIO, IMG SUPER/INTERP,W LEFT HEART VENTRICULOGRAPHY N/A 02/01/2022    Procedure: Left Heart Catheterization;  Surgeon: Jacquelyne Balint, MD;  Location: [facility] CATH;  Service: Cardiology    PR ENDOSCOPY W/VIDEO-ASST VEIN HARVEST,CABG Right 03/20/2022    Procedure: ENDOSCOPY, SURGICAL, INCLUDING VIDEO-ASSISTED HARVEST OF VEIN(S) FOR CORONARY ARTERY BYPASS PROCEDURE;  Surgeon: Arlester Marker, MD;  Location: MAIN OR [facility];  Service: Cardiac Surgery    PR OSTEOTOMY 1ST METATARSAL,BASE/SHAFT Left 07/20/2017    Procedure: OSTEOTOMY, W/WO LENGTHENING, SHORTENING OR ANGULAR CORRECTION, METATARSAL; FIRST METATARSAL;  Surgeon: Valda Favia, MD;  Location: ASC OR [facility];  Service: Ortho Foot & Ankle    PR OSTEOTOMY HEEL BONE Left 07/20/2017    Procedure: OSTEOTOMYLoistine Chance W/WO INT FIXA;  Surgeon: Valda Favia, MD;  Location: ASC OR [facility];  Service: Ortho Foot & Ankle    PR OSTEOTOMY METATARSAL (NOT 1ST) Left 06/06/2018    Procedure: OSTEOTOMY, W/WO LENGTHENING, SHORTENING OR ANGULAR CORRECTION, METATARSAL; OTHER THAN FIRST METATARSAL, EA;  Surgeon: Valda Favia, MD;  Location: ASC [facility];  Service: Orthopedics    PR PART REMV PHALANX OF TOE Left 06/06/2018    Procedure: PART EXC BONE PHALANX TOE;  Surgeon: Valda Favia, MD;  Location: ASC OR [facility];  Service: Orthopedics    PR RECONSTR TOTAL SHOULDER IMPLANT Left 11/12/2018    Procedure: R28   ARTHROPLASTY, GLENOHUMERAL JOINT; TOTAL SHOULDER(GLENOID &  PROXIMAL HUMERAL REPLACEMENT(EG, TOTAL SHOULDER);  Surgeon: Nickola Major, MD;  [facility]: [facility] OR [facility];  Service: Orthopedics    PR REMOVAL DEEP IMPLANT Left 11/12/2018    Procedure: REMOVE IMPLANT; DEEP SHOULDER;  Surgeon: Nickola Major, MD;  Location: [facility] OR [facility];  Service: Orthopedics    PR REPAIR BICEPS LONG TENDON Left 11/12/2018    Procedure: TENODESIS LONG TENDON BICEPS;  Surgeon: Nickola Major, MD;  Location: [facility] OR [facility];  Service: Orthopedics    PR REPAIR PERONEAL TENDONS,FIB OSTEOTMY Left 07/20/2017    Procedure: REPR DISLOC PERONEAL TENDONS; W/FIB OSTEOTOMY;  Surgeon: Valda Favia, MD;  Location: ASC OR [facility];  Service: Ortho Foot & Ankle    PR REVISE MEDIAN N/CARPAL TUNNEL SURG Left 02/17/2019    Procedure: R22 NEUROPLASTY AND/OR TRANSPOSITION; MEDIAN NERVE AT CARPAL TUNNEL;  Surgeon: Daisy Lazar, MD;  Location: ASC OR [facility];  Service: Orthopedics    PR REVISE ULNAR NERVE AT ELBOW Left 02/17/2019    Procedure: NEUROPLASTY AND/OR TRANSPOSITION; ULNAR NERVE AT ELBOW;  Surgeon: Daisy Lazar, MD;  Location: ASC OR [facility];  Service: Orthopedics    SHOULDER SURGERY Bilateral     L TSA, R ROTATOR CUFF      SKIN BIOPSY      SPINE SURGERY  1986/1995/2000/2002    LUMBAR FUSION    stent       Scheduled Medications:   acetaminophen  1,000 mg Oral QID    [START ON 03/22/2022] aspirin  81 mg Oral Daily    clopidogreL  75 mg Oral Daily    colchicine  0.6 mg Oral BID    docusate sodium  100 mg Oral BID    gabapentin  300 mg Oral BID    heparin (porcine) for subcutaneous use  5,000 Units Subcutaneous Q8H SCH    insulin lispro  0-9 Units Subcutaneous 5XD insulin    insulin lispro  9 Units Subcutaneous 3xd Meals    insulin NPH  10 Units Subcutaneous Q12H SCH    lidocaine  3 patch Transdermal Daily    magnesium oxide  400 mg Oral BID    melatonin  6 mg Oral QPM    mupirocin   Each Nare BID    naloxegoL  25 mg Oral Daily    polyethylene glycol  17 g Oral 3xd Meals    pravastatin  40 mg Oral Daily    sertraline  100 mg Oral Daily    tamsulosin  0.4 mg Oral Nightly    traZODone  150 mg Oral Nightly     Continuous Infusions:   insulin regular 1.5 Units/hr (03/21/22 1900)    lactated Ringers Stopped (03/20/22 0837)    sodium chloride       PRN Medications:  calcium chloride **AND** Notify Provider **AND** Obtain lab:   CALCIUM, IONIZED, dextrose in water, dextrose in water, fentaNYL (PF), glucagon, glucose, ipratropium-albuteroL, magnesium sulfate in water **AND** Notify Provider **AND** Notify Provider **AND** Obtain lab:   MAGNESIUM, SERUM, ondansetron, oxyCODONE, oxyCODONE, potassium chloride in water **OR** potassium chloride in water

## 2022-03-22 NOTE — Unmapped (Signed)
PHYSICAL THERAPY  Evaluation (03/22/22 1145)          Patient Name:  Christopher [name]       Medical Record Number: [id number]   Date of Birth: December 21, 1950  Sex: Male              Activity Tolerance: Limited by fatigue, Limited by Mental Status     ASSESSMENT  Problem List: Decreased mobility, Fall risk, Impaired ADLs, Decreased strength, Decreased endurance      Assessment : See problem list. Pt. was very willing to participate but no family was present for support. He reports his wife has medical issues and that his DIL works.  Pt. should steadily progress but will likely need 5x low if support is not available by family at dc. Epic review: Hospital course: 11/27 CABG x2 (LIMA to LAD, RSVG to OM), no TEE due to esophageal stricture     PMH: Angina, CAD (LAD and OM), HLD, T2DM (HgbA1C 7.9), HTN, statin intolerance, TIA.   After a review of the personal factors, comorbidities, clinical presentation, and examination of the number of affected body systems, the patient presents as a High complexity case.    Today's Interventions: eval, mob. trg, reviewed splinted cough/IS/POC/role of PT, PT goals, positioning                 Clinical Decision Making: High      PLAN  Planned Frequency of Treatment:  1-2x per day for: 4-5x week       Planned Interventions: Therapeutic activity, Positioning, Transfer training, Postural re-education, Self-care / Home training, Therapeutic exercise, Endurance activities, Education - Family / caregiver, Education - Patient, Investment banker, operational, Functional mobility, Home exercise program     Post-Discharge Physical Therapy Recommendations:  PT Post Acute Discharge Recommendations: 5x weekly, Low intensity      PT DME Recommendations: Defer to post acute            Goals:   Patient and Family Goals: none stated     Long Term Goal #1: return to PLOF in 6 weeks        SHORT GOAL #1: supine to/from sitting w/ HOB flat/no rail - indep.               Time Frame : 3 weeks  SHORT GOAL #2: sit to/from stand w/ device PRN - indep.              Time Frame : 3 weeks  SHORT GOAL #3: will ambulate > 300' w/ device - indep              Time Frame : 3 weeks                                            Prognosis:  Fair  Positive Indicators: willing to participate  Barriers to Discharge: Endurance deficits, Impaired Balance, Inability to safely perform ADLS, Functional strength deficits, Age, Gait instability     SUBJECTIVE  Patient reports: agreeable to PT  Current Functional Status: lying in bed     Prior Functional Status: Independent with home and community activities  Equipment available at home: Shower Chair with back, Bedside Commode      Past Medical History:   Diagnosis Date    Acute kidney injury (CMS-HCC) 01/12/2018    Anemia     IRON DEFICIENT  Anxiety dont know    Arthritis     Basal cell carcinoma     Cataract     CHF (congestive heart failure) (CMS-HCC)     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    Corneal abrasion 1975    steel in right eye with abrasion and removal    Cubital tunnel syndrome on left 01/16/2019    Depression 2016    Diabetes mellitus (CMS-HCC)     GERD (gastroesophageal reflux disease) 1982    Hypertension     Meningitis 12/25/2015    Mixed hyperlipidemia     Myocardial infarction (CMS-HCC) 1986    Obesity 1986    first back surgery    Peptic ulceration             Social History     Tobacco Use    Smoking status: Never    Smokeless tobacco: Never    Tobacco comments:     never will   Substance Use Topics    Alcohol use: No     Alcohol/week: 0.0 standard drinks of alcohol       Past Surgical History:   Procedure Laterality Date    BACK SURGERY      CARDIAC CATHETERIZATION      STENTS PLACEMENT     CARDIAC SURGERY  2005    stents    CORONARY STENT PLACEMENT      FOREIGN BODY REMOVAL Right about 20 years ago    HERNIA REPAIR      RIGHT INGUNIAL    JOINT REPLACEMENT Left     TOTAL SHOULDER    PR ARM/ELBOW TENDON LENGTHEN,SINGLE,EA Left 02/17/2019    Procedure: TENDON LENGTHENING UPPER ARM/ELBOW SNGL EA;  Surgeon: Daisy Lazar, MD;  Location: [facility] OR [facility];  Service: Orthopedics    PR CABG, ARTERIAL, SINGLE Midline 03/20/2022    Procedure: CORONARY ARTERY BYPASS, USING ARTERIAL GRAFT(S); SINGLE ARTERIAL GRAFT;  Surgeon: Arlester Marker, MD;  Location: MAIN OR [facility];  Service: Cardiac Surgery    PR CABG, ARTERY-VEIN, SINGLE Midline 03/20/2022    Procedure: CORONARY ARTERY BYPASS, USING VENOUS GRAFT(S) AND ARTERIAL GRAFT(S); SINGLE VEIN GRAFT;  Surgeon: Arlester Marker, MD;  Location: MAIN OR [facility];  Service: Cardiac Surgery    PR CATH PLACE/CORON ANGIO, IMG SUPER/INTERP,W LEFT HEART VENTRICULOGRAPHY N/A 02/01/2022    Procedure: Left Heart Catheterization;  Surgeon: Jacquelyne Balint, MD;  Location: [facility] CATH;  Service: Cardiology    PR ENDOSCOPY W/VIDEO-ASST VEIN HARVEST,CABG Right 03/20/2022    Procedure: ENDOSCOPY, SURGICAL, INCLUDING VIDEO-ASSISTED HARVEST OF VEIN(S) FOR CORONARY ARTERY BYPASS PROCEDURE;  Surgeon: Arlester Marker, MD;  Location: MAIN OR [facility];  Service: Cardiac Surgery    PR OSTEOTOMY 1ST METATARSAL,BASE/SHAFT Left 07/20/2017    Procedure: OSTEOTOMY, W/WO LENGTHENING, SHORTENING OR ANGULAR CORRECTION, METATARSAL; FIRST METATARSAL;  Surgeon: Valda Favia, MD;  Location: [facility] OR [facility];  Service: Ortho Foot & Ankle    PR OSTEOTOMY HEEL BONE Left 07/20/2017    Procedure: OSTEOTOMYLoistine Chance W/WO INT FIXA;  Surgeon: Valda Favia, MD;  Location: [facility] OR [facility];  Service: Ortho Foot & Ankle    PR OSTEOTOMY METATARSAL (NOT 1ST) Left 06/06/2018    Procedure: OSTEOTOMY, W/WO LENGTHENING, SHORTENING OR ANGULAR CORRECTION, METATARSAL; OTHER THAN FIRST METATARSAL, EA;  Surgeon: Valda Favia, MD;  Location: [facility] OR [facility];  Service: Orthopedics    PR PART REMV PHALANX OF TOE Left 06/06/2018    Procedure: PART EXC BONE PHALANX TOE;  Surgeon: Gennie Alma  Deborah Chalk, MD;  Location: [facility] OR [facility];  Service: Orthopedics    PR RECONSTR TOTAL SHOULDER IMPLANT Left 11/12/2018    Procedure: R28   ARTHROPLASTY, GLENOHUMERAL JOINT; TOTAL SHOULDER(GLENOID & PROXIMAL HUMERAL REPLACEMENT(EG, TOTAL SHOULDER);  Surgeon: Nickola Major, MD;  Location: [facility] OR [facility];  Service: Orthopedics    PR REMOVAL DEEP IMPLANT Left 11/12/2018    Procedure: REMOVE IMPLANT; DEEP SHOULDER;  Surgeon: Nickola Major, MD;  Location: [facility] OR [facility];  Service: Orthopedics    PR REPAIR BICEPS LONG TENDON Left 11/12/2018    Procedure: TENODESIS LONG TENDON BICEPS;  Surgeon: Nickola Major, MD;  Location: [facility] OR [facility];  Service: Orthopedics    PR REPAIR PERONEAL TENDONS,FIB OSTEOTMY Left 07/20/2017    Procedure: REPR DISLOC PERONEAL TENDONS; W/FIB OSTEOTOMY;  Surgeon: Valda Favia, MD;  Location: [facility] OR [facility];  Service: Ortho Foot & Ankle    PR REVISE MEDIAN N/CARPAL TUNNEL SURG Left 02/17/2019    Procedure: R22 NEUROPLASTY AND/OR TRANSPOSITION; MEDIAN NERVE AT CARPAL TUNNEL;  Surgeon: Daisy Lazar, MD;  Location: [facility] OR [facility];  Service: Orthopedics    PR REVISE ULNAR NERVE AT ELBOW Left 02/17/2019    Procedure: NEUROPLASTY AND/OR TRANSPOSITION; ULNAR NERVE AT ELBOW;  Surgeon: Daisy Lazar, MD;  Location: [facility] OR [facility];  Service: Orthopedics    SHOULDER SURGERY Bilateral     L TSA, R ROTATOR CUFF      SKIN BIOPSY      SPINE SURGERY  1986/1995/2000/2002    LUMBAR FUSION    stent               Family History   Problem Relation Age of Onset    Diabetes Mother     Stroke Mother     Cataracts Mother     Hypertension Mother     Alcohol abuse Father     Cancer Father     Diabetes Father     Cataracts Father     Hypertension Father     Melanoma Father     Hypertension Maternal Grandmother     Cancer Maternal Grandmother     Hypertension Maternal Grandfather     Hypertension Paternal Grandmother     Cataracts Paternal Grandfather     Hypertension Paternal Grandfather     COPD Brother         moker and alcholic    Early death Brother     COPD Sister         was a smoker    Diabetes Sister     COPD Brother         moker    Diabetes Brother     Diabetes Sister     Kidney disease Maternal Aunt         died from kidney diease due to diabeties    No Known Problems Maternal Uncle     No Known Problems Paternal Aunt     No Known Problems Paternal Uncle     No Known Problems Other     Blindness Neg Hx     Thyroid disease Neg Hx     Macular degeneration Neg Hx     Glaucoma Neg Hx     Basal cell carcinoma Neg Hx     Squamous cell carcinoma Neg Hx     Anesthesia problems Neg Hx     Broken bones Neg Hx     Clotting disorder Neg Hx  Collagen disease Neg Hx     Dislocations Neg Hx     Fibromyalgia Neg Hx     Gout Neg Hx     Hemophilia Neg Hx     Osteoporosis Neg Hx     Rheumatologic disease Neg Hx     Scoliosis Neg Hx     Severe sprains Neg Hx     Sickle cell anemia Neg Hx     Spinal Compression Fracture Neg Hx         Allergies: Morphine, Atorvastatin, Atorvastatin calcium, Baclofen, Ibuprofen, Pneumococcal 23-valent polysaccharide vaccine, and Zetia [ezetimibe]                  Objective Findings  Precautions / Restrictions  Precautions: Falls precautions, Modified sternal precautions  Weight Bearing Status: Non-applicable  Required Braces or Orthoses: Non-applicable     Communication Preference: Verbal          Pain Comments: 1/10 incisional pain     Equipment / Environment: Chest Tube, Foley, Supplemental oxygen, Telemetry, Vascular access (PIV, TLC, Port-a-cath, PICC)     Vitals/Orthostatics : vss     Living Situation  Living Environment: Physiological scientist  Lives With: Spouse (daughter in law)  Home Living: One level home, Walk-in shower, Raised toilet seat without rails, Ramped entrance, Shower chair with back, Standard height toilet  Rail placement (outside): Bilateral rails  Number of Stairs to Enter (outside): 7      Cognition: WFL (drowsy - recent narcotics)        Skin Inspection: Intact where visualized     Upper Extremities  UE ROM: Right WFL, Left WFL    Lower Extremities  LE ROM: Right WFL, Left WFL  LE Strength: Right Impaired/Limited, Left Impaired/Limited  RLE Strength Impairment: Reduced strength  LLE Strength Impairment: Reduced strength     Sensation: WFL (B UE)  Posture: Impaired (kyphotic, FH)                Bed Mobility: sit to supine w/ mod Ax1 for LE support     Transfer comments: sit to/from stand w/ rollator- min Ax1      Gait: ambulated 45'x 2 bouts w/ rollator - variable CGA to min Ax1-2 (rehab aide on opposite side) depending on fatigue level     Stairs: x            Endurance: poor     Physical Therapy Session Duration  PT Individual [mins]: 50     Medical Staff Made Aware: RN     I attest that I have reviewed the above information.  SignedArdeth Perfect, PT  Filed 03/22/2022

## 2022-03-22 NOTE — Unmapped (Signed)
CVT ICU CRITICAL CARE NOTE     Date of Service: 03/22/2022    Hospital Day: LOS: 2 days        Surgery Date: 11/27  Surgical Attending: Arlester Marker, MD    Critical Care Attending: Gordy Clement, MD    Interval History:   Delining. Lasix 20. Starting metop.     History of Present Illness:   Christopher [name] is a 71 y.o. male w/ angina, CAD (LAD and OM), HLD, T2DM (HgbA1C 7.9), HTN, statin intolerance, TIA     Hospital Course:  11/27 CABG x2 (LIMA to LAD, RSVG to OM), no TEE due to esophageal stricture     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 *     ASSESSMENT & PLAN:     Cardiovascular: Postoperative state from CABG  - No TEE done 2/2 esophageal stricture. Normal EF on TTE.  - Intraoperative blood products: 600 mL cell saver returned, 340 autologous blood returned  - Goal MAP 65-70mmHg  - Metop 12.5 q8h for POAF PPX  - Mediastinal tubes to -20 cm suction, monitor output  - continue ASA, statin, plavix    Respiratory:     - Goal SpO2 >92%, wean oxygen as tolerated  - Aggressive pulmonary hygiene, encourage IS  - Pleural drains to -20cm suction, monitor output    Neurologic: Post-operative pain  - Pain: Fentanyl PRN, oxy PRN   - holding home gabapentin d/t somnolence  - Frailty score: Simple Frail Screening  Are you fatigued? : YES  Can you walk up one flight of stairs? : YES  Can you walk one block? : YES  Have you lost more than 5% of your weight in the last 6 months? : NO   - Frail (3-5)   - Pre-frail (1-2)   - Robust (0)    Renal/Genitourinary:  NAI  - Replete electrolytes PRN  - Strict I&O  - continue home flomax    Gastrointestinal: Gastrointestinal bleed  - Diet: Reg  - Bowel regimen: miralax, colace    Endocrine:  T2DM  - Glycemic Control: subcutaneous insulin    Hematologic: Acute blood loss anemia secondary to cardiac surgery    - Monitor CBC daily, transfuse products as indicated  - Monitor chest tube output  - SQH for DVT ppx    Immunologic/Infectious Disease:  - Afebrile, leukocytosis likely secondary to post-operative inflammatory state, continue to monitor    Integumentary  - Skin bundle discussed on AM rounds? Yes  - Pressure injury present on admission to CVTICU? No  - Is CWOCN consulted? No  - Are any CWOCN verified pressure injuries present since admission to CVTICU? No    Mobility   Mobility level 1. HOB elevated to 30* for 1 hour (2x per shift)   PT/OT consulted Yes.    Daily Care Checklist:   - Stress  Ulcer Prevention: No  - DVT Prophylaxis: Chemical: SQ Heparin and Mechanical: Yes.  - HOB >30 degrees: Yes   - Daily Awakening: N/A  - Spontaneous Breathing Trial: N/A  - Indication for Beta Blockade: No  - Indication for Central/PICC Line: Yes  Infusions requiring central access, Hemodynamic monitoring, and Aggressive fluid resuscitation  - Indication for Urinary Catheter: Yes  Strict intake and output, Critically ill, and Agressive diuresis/hydration  - Diagnostic images/reports of past 24hrs reviewed: Yes    Disposition:   - Continue ICU care    [name] is critically ill due to: Postoperative state from CABG, lactic acidosis, postoperative mechanical ventilation, hypotension, surgical pain  This critical care time includes examining the patient, evaluating the hemodynamic, laboratory, and radiographic data, independently developing a comprehensive management plan, and serially assessing the patient's response to these critical care interventions. This critical care time excludes procedures.    Critical care time: 40 minutes     Vernetta Honey, PA   Cardiovascular and Thoracic Intensive Care Unit  Department of Anesthesiology  [place] [facility] [place] [facility]     SUBJECTIVE:      Endorses 1-2/10 incisional chest pain.      OBJECTIVE:     Physical Exam:  Constitutional: M appearing stated age, NAD, sitting in recliner  Neurologic: Alert, oriented, PERRL, no focal deficits  Respiratory: Nonlabored on Arabi  Cardiovascular: RRR, 1+ edema  Gastrointestinal: Round, nontender  Musculoskeletal: MAE to command  Skin: Warm, dry  Sternotomy incision: Primary OR dressing c/d/i    Temp:  [36.8 ??C (98.2 ??F)-37.5 ??C (99.5 ??F)] 37.5 ??C (99.5 ??F)  Core Temp:  [37 ??C (98.6 ??F)-37.1 ??C (98.8 ??F)] 37 ??C (98.6 ??F)  Heart Rate:  [66-84] 84  SpO2 Pulse:  [67-83] 83  Resp:  [13-23] 16  BP: (104-155)/(45-77) 142/72  MAP (mmHg):  [58-102] 96  A BP-1: (83-144)/(53-142) 144/142  MAP:  [66 mmHg-229 mmHg] 143 mmHg  SpO2:  [92 %-99 %] 99 %  CVP:  [4 mmHg-282 mmHg] 6 mmHg    Recent Laboratory Results:  Recent Labs   Lab Units 03/21/22  1500   PH ART  7.28*   PCO2 ART mm[Hg] 45.9*   PO2 ART mm[Hg] 84   HCO3 ART mmol/L 21.8*   BASE EXC ART mmol/L -4.9*   O2 SAT ART % 96.2       Recent Labs   Lab Units 03/22/22  0315 03/22/22  0035 03/21/22  1500 03/21/22  1457 03/21/22  0640 03/21/22  0335   SODIUM WHOLE BLOOD mmol/L 135 137   < >  --    < > 134*   SODIUM mmol/L 138  --   --  137  --  138   POTASSIUM WHOLE BLOOD mmol/L 4.3 4.5   < >  --    < > 4.9*   POTASSIUM mmol/L 4.5  --   --  4.9*  --  5.2*   CHLORIDE mmol/L 106  --   --  109*  --  109*   CO2 mmol/L 26.0  --   --  24.0  --  22.0   BUN mg/dL 15  --   --  17  --  15   CREATININE mg/dL 1.61  --   --  0.96  --  0.97   GLUCOSE mg/dL 045  --   --  409  --  152*    < > = values in this interval not  displayed.       Lab Results   Component Value Date    BILITOT 0.6 03/20/2022    BILITOT 0.4 03/01/2022    BILIDIR 0.20 03/20/2022    ALT 12 03/20/2022    ALT 14 03/01/2022    AST 31 03/20/2022    AST 21 03/01/2022    ALKPHOS 41 (L) 03/20/2022    ALKPHOS 61 03/01/2022    PROT 5.8 03/20/2022    PROT 7.7 03/01/2022    ALBUMIN 3.5 03/20/2022    ALBUMIN 4.1 03/01/2022     Recent Labs   Lab Units 03/21/22  2203 03/21/22  2041 03/21/22  1856 03/21/22  1658   POC GLUCOSE mg/dL 161 87 096 045       Recent Labs   Lab Units 03/22/22  0315 03/21/22  1500 03/21/22  0335 03/20/22  1733 03/20/22  1340   WBC 10*9/L 8.3  --  9.8  --  15.5*   RBC 10*12/L 3.01*  --  3.31*  --  3.74*   HEMOGLOBIN g/dL 9.6*  --  40.9*  --  81.1*   HEMOGLOBIN BG g/dL  --  9.4*  --    < >  --    HEMATOCRIT % 27.9*  --  31.1*  --  34.4*   MCV fL 92.7  --  93.9  --  92.0   MCH pg 31.7  --  31.5  --  30.9   MCHC g/dL 91.4  --  78.2  --  95.6   RDW % 14.0  --  13.6  --  13.5   PLATELET COUNT (1) 10*9/L 104*  --  128*  --  146*   MPV fL 8.0  --  7.9  --  7.7    < > = values in this interval not displayed.       No results in the last day    Invalid input(s): PTPATIENT     Lines & Tubes:   Patient Lines/Drains/Airways Status       Active Peripheral & Central Intravenous Access       Name Placement date Placement time Site Days    Peripheral IV 03/20/22 Left Hand 03/20/22  0710  Hand  2    CVC Triple Lumen 03/20/22 Right Internal jugular 03/20/22  1300  Internal jugular  1    CVC MAC Introducer 03/20/22 Right Internal jugular 03/20/22  1300  Internal jugular  1                  Urethral Catheter Non-latex;Straight-tip;Temperature probe 16 Fr. (Active)   Output (mL) 150 mL 03/20/22 1335   Number of days: 0     Patient Lines/Drains/Airways Status       Active Wounds       Name Placement date Placement time Site Days    Surgical Site 03/20/22 Chest Mid 03/20/22  1000  -- 2    Surgical Site 03/20/22 Leg Right 03/20/22  1233  -- 1                     Respiratory/ventilator settings for last 24 hours: Intake/Output last 3 shifts:  I/O last 3 completed shifts:  In: 1484 [I.V.:73.1; IV Piggyback:1410.8]  Out: 3530 [Urine:2390; Emesis/NG output:100; Chest Tube:1040]    Daily/Recent Weight:  90.4 kg (199 lb 4.7 oz)    BMI:  Body mass index is 32.18 kg/m??.  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 01/16/2019    Depression 2016    Diabetes mellitus (CMS-HCC)     GERD (gastroesophageal reflux disease) 1982    Hypertension     Meningitis 12/25/2015    Mixed hyperlipidemia     Myocardial infarction (CMS-HCC) 1986    Obesity 1986    first back surgery    Peptic ulceration      Past Surgical History:   Procedure Laterality Date    BACK SURGERY      CARDIAC CATHETERIZATION      STENTS PLACEMENT     CARDIAC SURGERY  2005    stents    CORONARY STENT PLACEMENT      FOREIGN BODY REMOVAL Right about 20 years ago    HERNIA REPAIR      RIGHT INGUNIAL    JOINT REPLACEMENT Left     TOTAL SHOULDER    PR ARM/ELBOW TENDON LENGTHEN,SINGLE,EA Left 02/17/2019    Procedure: TENDON LENGTHENING UPPER ARM/ELBOW SNGL EA;  Surgeon: Daisy Lazar, MD;  Location: ASC OR [facility];  Service: Orthopedics    PR CABG, ARTERIAL, SINGLE Midline 03/20/2022    Procedure: CORONARY ARTERY BYPASS, USING ARTERIAL GRAFT(S); SINGLE ARTERIAL GRAFT;  Surgeon: Arlester Marker, MD;  Location: MAIN OR [facility];  Service: Cardiac Surgery    PR CABG, ARTERY-VEIN, SINGLE Midline 03/20/2022    Procedure: CORONARY ARTERY BYPASS, USING VENOUS GRAFT(S) AND ARTERIAL GRAFT(S); SINGLE VEIN GRAFT;  Surgeon: Arlester Marker, MD;  Location: MAIN OR [facility];  Service: Cardiac Surgery    PR CATH PLACE/CORON ANGIO, IMG SUPER/INTERP,W LEFT HEART VENTRICULOGRAPHY N/A 02/01/2022    Procedure: Left Heart Catheterization;  Surgeon: Jacquelyne Balint, MD;  Location: [facility] CATH;  Service: Cardiology    PR ENDOSCOPY W/VIDEO-ASST VEIN HARVEST,CABG Right 03/20/2022    Procedure: ENDOSCOPY, SURGICAL, INCLUDING VIDEO-ASSISTED HARVEST OF VEIN(S) FOR CORONARY ARTERY BYPASS PROCEDURE;  Surgeon: Arlester Marker, MD;  Location: MAIN OR [facility];  Service: Cardiac Surgery    PR OSTEOTOMY 1ST METATARSAL,BASE/SHAFT Left 07/20/2017    Procedure: OSTEOTOMY, W/WO LENGTHENING, SHORTENING OR ANGULAR CORRECTION, METATARSAL; FIRST METATARSAL;  Surgeon: Valda Favia, MD;  Location: ASC OR [facility];  Service: Ortho Foot & Ankle    PR OSTEOTOMY HEEL BONE Left 07/20/2017    Procedure: OSTEOTOMYLoistine Chance W/WO INT FIXA;  Surgeon: Valda Favia, MD;  Location: ASC OR [facility];  Service: Ortho Foot & Ankle    PR OSTEOTOMY METATARSAL (NOT 1ST) Left 06/06/2018    Procedure: OSTEOTOMY, W/WO LENGTHENING, SHORTENING OR ANGULAR CORRECTION, METATARSAL; OTHER THAN FIRST METATARSAL, EA;  Surgeon: Valda Favia, MD;  Location: ASC OR [facility];  Service: Orthopedics    PR PART REMV PHALANX OF TOE Left 06/06/2018    Procedure: PART EXC BONE PHALANX TOE;  Surgeon: Valda Favia, MD;  Location: ASC OR [facility];  Service: Orthopedics    PR RECONSTR TOTAL SHOULDER IMPLANT Left 11/12/2018    Procedure: R28   ARTHROPLASTY, GLENOHUMERAL JOINT; TOTAL SHOULDER(GLENOID &  PROXIMAL HUMERAL REPLACEMENT(EG, TOTAL SHOULDER);  Surgeon: Nickola Major, MD;  Location: [facility] OR [facility];  Service: Orthopedics    PR REMOVAL DEEP IMPLANT Left 11/12/2018    Procedure: REMOVE IMPLANT; DEEP SHOULDER;  Surgeon: Nickola Major, MD;  Location: [facility] OR [facility];  Service: Orthopedics    PR REPAIR BICEPS LONG TENDON Left 11/12/2018    Procedure: TENODESIS LONG TENDON BICEPS;  Surgeon: Nickola Major, MD; Location: [facility] OR [facility];  Service: Orthopedics    PR REPAIR PERONEAL TENDONS,FIB OSTEOTMY Left 07/20/2017    Procedure: REPR DISLOC PERONEAL TENDONS; W/FIB OSTEOTOMY;  Surgeon: Valda Favia, MD;  Location: ASC OR [facility];  Service: Ortho Foot & Ankle    PR REVISE MEDIAN N/CARPAL TUNNEL SURG Left 02/17/2019    Procedure: R22 NEUROPLASTY AND/OR TRANSPOSITION; MEDIAN NERVE AT CARPAL TUNNEL;  Surgeon: Daisy Lazar, MD;  Location: ASC OR [facility];  Service: Orthopedics    PR REVISE ULNAR NERVE AT ELBOW Left 02/17/2019    Procedure: NEUROPLASTY AND/OR TRANSPOSITION; ULNAR NERVE AT ELBOW;  Surgeon: Daisy Lazar, MD;  Location: ASC OR [facility];  Service: Orthopedics    SHOULDER SURGERY Bilateral     L TSA, R ROTATOR CUFF      SKIN BIOPSY      SPINE SURGERY  1986/1995/2000/2002    LUMBAR FUSION    stent       Scheduled Medications:   acetaminophen  1,000 mg Oral QID    aspirin  81 mg Oral Daily    clopidogreL  75 mg Oral Daily    colchicine  0.6 mg Oral BID    docusate sodium  100 mg Oral BID    furosemide  20 mg Intravenous Once    gabapentin  300 mg Oral BID    heparin (porcine) for subcutaneous use  5,000 Units Subcutaneous Q8H SCH    insulin lispro  0-9 Units Subcutaneous ACHS    insulin lispro  9 Units Subcutaneous 3xd Meals    insulin NPH  10 Units Subcutaneous Q12H SCH    lidocaine  3 patch Transdermal Daily    magnesium oxide  400 mg Oral BID    melatonin  6 mg Oral QPM    metoPROLOL tartrate  12.5 mg Oral Q8H SCH    mupirocin   Each Nare BID    naloxegoL  25 mg Oral Daily    polyethylene glycol  17 g Oral 3xd Meals    [START ON 03/23/2022] potassium chloride  10 mEq Oral Daily    pravastatin  40 mg Oral Daily    sertraline  100 mg Oral Daily    tamsulosin  0.4 mg Oral Nightly    traZODone  150 mg Oral Nightly     Continuous Infusions:      PRN Medications:  dextrose in water, dextrose in water, fentaNYL (PF), glucagon, glucose, ipratropium-albuteroL, magnesium sulfate in water **AND** Notify Provider **AND** Notify Provider **AND** Obtain lab:   MAGNESIUM, SERUM, ondansetron, oxyCODONE, oxyCODONE

## 2022-03-22 NOTE — Unmapped (Addendum)
Endocrine Team Diabetes Follow Up Consult Note     Consult information:  Requesting Attending Physician : Arlester Marker, MD  Service Requesting Consult : Surg Cardiac [facility])  Primary Care Provider: Amada Kingfisher, MD  Impression:  Christopher [name] Tilak Brietzke is a 71 y.o. male admitted for 2v CABG (LIMA to LAD, RSVG to OM), now s/p 11/27. We have been consulted at the request of Arlester Marker, MD to evaluate Christopher [name] for hyperglycemia.     Medical Decision Making:  Diagnoses:  1.Type 2 Diabetes. Uncontrolled With hyperglycemia. Currently within inpatient BG target range.  2. Nutrition: Complicating glycemic control. Increasing risk for both hypoglycemia and hyperglycemia.  3. Coronary Artery Disease. Complicating glycemic control and increasing risk for hyperglycemia and hypoglycemia.  4. Obesity. Complicating glycemic control and increasing risk for hyperglycemia.  5. Chronic Kidney Disease. Complicating glycemic control and increasing risk for hypoglycemia.  6. Catecholamine infusion. Complicating glycemic control and increasing risk for hyperglycemia.      Studies reviewed 03/22/22:  Labs: CBC, BMP, POCT-BG, and HbA1C  Interpretation: No leukocytosis, anemia present. Electrolytes normal. 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 Generally well-controlled BGs, ranging 103-164 mg/dL  in the past 24 hours. A1C 7.9  (12/22/21) indicating fair outpatient glycemic management.  Notes reviewed: Primary team, nursing notes, and cardiac surgery      Overall impression based on above reviews and history:  Pt with T2DM s/p 2vCABG 11/27. He was converted off of the IV insulin infusion during the evening yesterday. Using his requirements off of the IV insulin infusion a projected TDD of insulin was calculated, and 80% of that utilized. 40% of that total was reserved for basal coverage and 60% for nutritional coverage. A correctional scale with an ISF of 30 was also implemented. No further changes at this time. Will continue to monitor closely.      Recommendations:  - NPH 10 units Q12H  - Lispro 9 units TIDAC  - Lispro 1:30 > 140 ACHS  - Hypoglycemia protocol.  - POCT-BG achs.  - Ensure patient is on glucose precautions if patient taking nutrition by mouth.     Discharge planning:  In process. Will complete closer to discharge.    Thank you for this consult. Discussed plan with primary team. We will continue to follow and make recommendations and place orders as appropriate.    Please page with questions or concerns: Karna Christmas, NP: [phone]  Endocrinology Diabetes Care Team on call from 6AM - 3PM on weekdays then endocrine fellow on call: [phone] from 3PM - 6AM on weekdays and on weekends and holidays.   If APP cannot be reached, please page the endocrine fellow on call.      Subjective:  Interval Encounter:  Pt a little drowsy but states he is doing okay, denies n/v. Not the best appetite at this time. No acute concerns or questions noted at time of rounds.    Initial HPI:  Christopher [name] is a 71 y.o. male with past medical history of accelerating angina, native coronary artery disease (LAD and OM), HLD, T2DM (HgbA1C 7.9), HTN, statin intolerance, TIA who presents for evaluation for Coronary Artery Bypass Grafting. [name] reports that he has CP/Angina every day, even at rest. However, this is worsened by exertion. He denies other associated symptoms.    He is now s/p CABGx2 (LIMA to LAD, RSVG to OM) 11/27       Diabetes History:  Patient has  a history of Type 2 diabetes diagnosed about 10 years ago.  Diabetes is managed by: PCP.  Current home diabetes regimen: metformin 1000 mg BID AC.  Current home blood glucose monitoring: daily.  Hypoglycemia awareness: Aware.  Complications related to diabetes: retinopathy and chronic kidney disease      Current Nutrition:  Active Orders   Diet    Nutrition Therapy Regular/House       ROS: As per HPI.     acetaminophen  1,000 mg Oral QID aspirin  81 mg Oral Daily    clopidogreL  75 mg Oral Daily    colchicine  0.6 mg Oral BID    docusate sodium  100 mg Oral BID    gabapentin  300 mg Oral BID    heparin (porcine) for subcutaneous use  5,000 Units Subcutaneous Q8H SCH    insulin lispro  0-9 Units Subcutaneous ACHS    insulin lispro  9 Units Subcutaneous 3xd Meals    insulin NPH  10 Units Subcutaneous Q12H SCH    lidocaine  3 patch Transdermal Daily    magnesium oxide  400 mg Oral BID    melatonin  6 mg Oral QPM    mupirocin   Each Nare BID    naloxegoL  25 mg Oral Daily    polyethylene glycol  17 g Oral 3xd Meals    pravastatin  40 mg Oral Daily    sertraline  100 mg Oral Daily    tamsulosin  0.4 mg Oral Nightly    traZODone  150 mg Oral Nightly       Current Outpatient Medications   Medication Instructions    alirocumab 75 mg/mL PnIj Inject the contents of 1 pen (75 mg) under the skin every fourteen (14) days.    amlodipine (NORVASC) 10 mg, Oral, Daily (standard), For high blood pressure    aspirin (ECOTRIN) 81 mg, Oral, Daily (standard)    blood sugar diagnostic (ONETOUCH ULTRA TEST) Strp USE ONCE DAILY TO CHECK FASTING BLOOD GLUCOSE    blood-glucose meter Misc 1 Device, Intradermal, Daily (standard), DX: E11.8    clopidogreL (PLAVIX) 75 mg, Oral, Daily (standard)    diclofenac sodium (VOLTAREN) 1 % gel APPLY 2 GRAMS TO AFFECTED  AREA(S) TOPICALLY 4 TIMES DAILY    diclofenac sodium (VOLTAREN) 2 g, Topical, 4 times a day    empty container (SHARPS-A-GATOR DISPOSAL SYSTEM) Misc Use as directed for sharps disposal    fluticasone propionate (FLONASE) 50 mcg/actuation nasal spray 2 sprays, Each Nare, Daily (standard)    gabapentin (NEURONTIN) 300 MG capsule TAKE 1 CAPSULE BY MOUTH TWICE  DAILY    isosorbide mononitrate (IMDUR) 30 mg, Oral, 2 times a day    metFORMIN (GLUCOPHAGE) 1,000 mg, Oral, 2 times a day with meals    metoPROLOL succinate (TOPROL-XL) 100 mg, Oral, Daily (standard)    nitroglycerin (NITROSTAT) 0.4 mg, Sublingual, Every 5 min PRN omeprazole (PRILOSEC) 20 mg, Oral, Daily (standard)    pravastatin (PRAVACHOL) 10 mg, Oral, Daily (standard)    sertraline (ZOLOFT) 100 mg, Oral, Daily (standard)    tamsulosin (FLOMAX) 0.4 mg, Oral, Daily (standard)    traZODone (DESYREL) 150 mg, Oral, Nightly    trospium (SANCTURA) 20 mg, Oral, Nightly           Past Medical History:   Diagnosis Date    Acute kidney injury (CMS-HCC) 01/12/2018    Anemia     IRON DEFICIENT     Anxiety dont know    Arthritis  Basal cell carcinoma     Cataract     CHF (congestive heart failure) (CMS-HCC)     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    Corneal abrasion 1975    steel in right eye with abrasion and removal    Cubital tunnel syndrome on left 01/16/2019    Depression 2016    Diabetes mellitus (CMS-HCC)     GERD (gastroesophageal reflux disease) 1982    Hypertension     Meningitis 12/25/2015    Mixed hyperlipidemia     Myocardial infarction (CMS-HCC) 1986    Obesity 1986    first back surgery    Peptic ulceration        Past Surgical History:   Procedure Laterality Date    BACK SURGERY      CARDIAC CATHETERIZATION      STENTS PLACEMENT     CARDIAC SURGERY  2005    stents    CORONARY STENT PLACEMENT      FOREIGN BODY REMOVAL Right about 20 years ago    HERNIA REPAIR      RIGHT INGUNIAL    JOINT REPLACEMENT Left     TOTAL SHOULDER    PR ARM/ELBOW TENDON LENGTHEN,SINGLE,EA Left 02/17/2019    Procedure: TENDON LENGTHENING UPPER ARM/ELBOW SNGL EA;  Surgeon: Daisy Lazar, MD;  Location: ASC OR [facility];  Service: Orthopedics    PR CABG, ARTERIAL, SINGLE Midline 03/20/2022    Procedure: CORONARY ARTERY BYPASS, USING ARTERIAL GRAFT(S); SINGLE ARTERIAL GRAFT;  Surgeon: Arlester Marker, MD;  Location: MAIN OR [facility];  Service: Cardiac Surgery    PR CABG, ARTERY-VEIN, SINGLE Midline 03/20/2022    Procedure: CORONARY ARTERY BYPASS, USING VENOUS GRAFT(S) AND ARTERIAL GRAFT(S); SINGLE VEIN GRAFT;  Surgeon: Arlester Marker, MD;  Location: MAIN OR [facility]; Service: Cardiac Surgery    PR CATH PLACE/CORON ANGIO, IMG SUPER/INTERP,W LEFT HEART VENTRICULOGRAPHY N/A 02/01/2022    Procedure: Left Heart Catheterization;  Surgeon: Jacquelyne Balint, MD;  Location: [facility] CATH;  Service: Cardiology    PR ENDOSCOPY W/VIDEO-ASST VEIN HARVEST,CABG Right 03/20/2022    Procedure: ENDOSCOPY, SURGICAL, INCLUDING VIDEO-ASSISTED HARVEST OF VEIN(S) FOR CORONARY ARTERY BYPASS PROCEDURE;  Surgeon: Arlester Marker, MD;  Location: MAIN OR [facility];  Service: Cardiac Surgery    PR OSTEOTOMY 1ST METATARSAL,BASE/SHAFT Left 07/20/2017    Procedure: OSTEOTOMY, W/WO LENGTHENING, SHORTENING OR ANGULAR CORRECTION, METATARSAL; FIRST METATARSAL;  Surgeon: Valda Favia, MD;  Location: ASC OR [facility];  Service: Ortho Foot & Ankle    PR OSTEOTOMY HEEL BONE Left 07/20/2017    Procedure: OSTEOTOMYLoistine Chance W/WO INT FIXA;  Surgeon: Valda Favia, MD;  Location: [facility] OR [facility];  Service: Ortho Foot & Ankle    PR OSTEOTOMY METATARSAL (NOT 1ST) Left 06/06/2018    Procedure: OSTEOTOMY, W/WO LENGTHENING, SHORTENING OR ANGULAR CORRECTION, METATARSAL; OTHER THAN FIRST METATARSAL, EA;  Surgeon: Valda Favia, MD;  Location: [facility] OR [facility];  Service: Orthopedics    PR PART REMV PHALANX OF TOE Left 06/06/2018    Procedure: PART EXC BONE PHALANX TOE;  Surgeon: Valda Favia, MD;  [facility]: [facility];  Service: Orthopedics    PR RECONSTR TOTAL SHOULDER IMPLANT Left 11/12/2018    Procedure: R28   ARTHROPLASTY, GLENOHUMERAL JOINT; TOTAL SHOULDER(GLENOID & PROXIMAL HUMERAL REPLACEMENT(EG, TOTAL SHOULDER);  Surgeon: Nickola Major, MD;  [facility]: [facility] OR [facility];  Service: Orthopedics    PR REMOVAL DEEP IMPLANT Left 11/12/2018    Procedure: REMOVE IMPLANT; DEEP SHOULDER;  Surgeon: Nickola Major,  MD;  [facility] OR [facility];  Service: Orthopedics    PR REPAIR BICEPS LONG TENDON Left 11/12/2018    Procedure: TENODESIS LONG TENDON BICEPS;  Surgeon: Nickola Major, MD;  [facility]: [facility] OR [facility];  Service: Orthopedics    PR REPAIR PERONEAL TENDONS,FIB OSTEOTMY Left 07/20/2017    Procedure: REPR DISLOC PERONEAL TENDONS; W/FIB OSTEOTOMY;  Surgeon: Valda Favia, MD;  Location: [facility] OR [facility];  Service: Ortho Foot & Ankle    PR REVISE MEDIAN N/CARPAL TUNNEL SURG Left 02/17/2019    Procedure: R22 NEUROPLASTY AND/OR TRANSPOSITION; MEDIAN NERVE AT CARPAL TUNNEL;  Surgeon: Daisy Lazar, MD;  Location: [facility] OR [facility];  Service: Orthopedics    PR REVISE ULNAR NERVE AT ELBOW Left 02/17/2019    Procedure: NEUROPLASTY AND/OR TRANSPOSITION; ULNAR NERVE AT ELBOW;  Surgeon: Daisy Lazar, MD;  Location: [facility] OR [facility];  Service: Orthopedics    SHOULDER SURGERY Bilateral     L TSA, R ROTATOR CUFF      SKIN BIOPSY      SPINE SURGERY  1986/1995/2000/2002    LUMBAR FUSION    stent         Family History   Problem Relation Age of Onset    Diabetes Mother     Stroke Mother     Cataracts Mother     Hypertension Mother     Alcohol abuse Father     Cancer Father     Diabetes Father     Cataracts Father     Hypertension Father     Melanoma Father     Hypertension Maternal Grandmother     Cancer Maternal Grandmother     Hypertension Maternal Grandfather     Hypertension Paternal Grandmother     Cataracts Paternal Grandfather     Hypertension Paternal Grandfather     COPD Brother         moker and alcholic    Early death Brother     COPD Sister         was a smoker    Diabetes Sister     COPD Brother         moker    Diabetes Brother     Diabetes Sister     Kidney disease Maternal Aunt         died from kidney diease due to diabeties    No Known Problems Maternal Uncle     No Known Problems Paternal Aunt     No Known Problems Paternal Uncle     No Known Problems Other     Blindness Neg Hx     Thyroid disease Neg Hx     Macular degeneration Neg Hx     Glaucoma Neg Hx     Basal cell carcinoma Neg Hx     Squamous cell carcinoma Neg Hx     Anesthesia problems Neg Hx     Broken bones Neg Hx     Clotting disorder Neg Hx     Collagen disease Neg Hx     Dislocations Neg Hx     Fibromyalgia Neg Hx     Gout Neg Hx     Hemophilia Neg Hx     Osteoporosis Neg Hx     Rheumatologic disease Neg Hx     Scoliosis Neg Hx     Severe sprains Neg Hx     Sickle cell anemia Neg Hx     Spinal Compression Fracture Neg Hx  Social History     Tobacco Use    Smoking status: Never    Smokeless tobacco: Never    Tobacco comments:     never will   Vaping Use    Vaping Use: Never used   Substance Use Topics    Alcohol use: No     Alcohol/week: 0.0 standard drinks of alcohol    Drug use: Not Currently       OBJECTIVE:  BP 125/70  - Pulse 79  - Temp 37.5 ??C (99.5 ??F) (Oral)  - Resp 13  - Ht 167.6 cm (5' 5.98)  - Wt 90.4 kg (199 lb 4.7 oz)  - SpO2 93%  - BMI 32.18 kg/m??   Wt Readings from Last 12 Encounters:   03/21/22 90.4 kg (199 lb 4.7 oz)   03/02/22 83.6 kg (184 lb 3.2 oz)   03/01/22 83 kg (183 lb)   02/21/22 81.2 kg (179 lb)   02/01/22 81.2 kg (179 lb)   01/31/22 81.6 kg (179 lb 12.8 oz)   12/22/21 81.6 kg (180 lb)   11/16/21 81.9 kg (180 lb 9.6 oz)   08/16/21 87.6 kg (193 lb 3.2 oz)   08/09/21 86.2 kg (190 lb 0.3 oz)   05/24/21 90.3 kg (199 lb)   05/11/21 88.9 kg (196 lb)     Physical Exam  Vitals and nursing note reviewed.   Constitutional:       General: He is not in acute distress.     Appearance: He is not ill-appearing.      Comments: drowsy   HENT:      Head: Normocephalic and atraumatic.   Cardiovascular:      Rate and Rhythm: Normal rate and regular rhythm.   Pulmonary:      Effort: Pulmonary effort is normal. No respiratory distress.      Comments: Supplemental O2 via [place]  Skin:     General: Skin is warm and dry.      Comments: Surgical incisions, mid-sternal, s/p CABG 11/27   Neurological:      Mental Status: He is oriented to person, place, and time.             BG/insulin reviewed per EMR.   Glucose, POC (mg/dL)   Date Value   16/01/9603 115   03/21/2022 87   03/21/2022 104 03/21/2022 101   03/21/2022 139   03/21/2022 132   03/21/2022 110   03/21/2022 119   12/28/2015 121   12/28/2015 139   12/28/2015 136   12/27/2015 118   12/27/2015 113   12/27/2015 116   12/27/2015 149   12/26/2015 178        Summary of labs:  Lab Results   Component Value Date    A1C 7.9 (H) 12/22/2021    A1C 7.8 (H) 08/09/2021    A1C 7.7 (H) 04/05/2021     Lab Results   Component Value Date    CREATININE 0.92 03/22/2022     Lab Results   Component Value Date    WBC 8.3 03/22/2022    HGB 9.6 (L) 03/22/2022    HCT 27.9 (L) 03/22/2022    PLT 104 (L) 03/22/2022       Lab Results   Component Value Date    NA 138 03/22/2022    NA 135 03/22/2022    K 4.5 03/22/2022    K 4.3 03/22/2022    CL 106 03/22/2022    CO2 26.0 03/22/2022    BUN 15  03/22/2022    CREATININE 0.92 03/22/2022    GLU 141 03/22/2022    CALCIUM 8.9 03/22/2022    MG 1.7 03/22/2022    PHOS 3.0 03/22/2022       Lab Results   Component Value Date    BILITOT 0.6 03/20/2022    BILIDIR 0.20 03/20/2022    PROT 5.8 03/20/2022    ALBUMIN 3.5 03/20/2022    ALT 12 03/20/2022    AST 31 03/20/2022    ALKPHOS 41 (L) 03/20/2022

## 2022-03-23 LAB — CBC
HEMATOCRIT: 25.7 % — ABNORMAL LOW (ref 39.0–48.0)
HEMOGLOBIN: 8.7 g/dL — ABNORMAL LOW (ref 12.9–16.5)
MEAN CORPUSCULAR HEMOGLOBIN CONC: 34.1 g/dL (ref 32.0–36.0)
MEAN CORPUSCULAR HEMOGLOBIN: 31.7 pg (ref 25.9–32.4)
MEAN CORPUSCULAR VOLUME: 93 fL (ref 77.6–95.7)
MEAN PLATELET VOLUME: 7.7 fL (ref 6.8–10.7)
PLATELET COUNT: 113 10*9/L — ABNORMAL LOW (ref 150–450)
RED BLOOD CELL COUNT: 2.76 10*12/L — ABNORMAL LOW (ref 4.26–5.60)
RED CELL DISTRIBUTION WIDTH: 13.7 % (ref 12.2–15.2)
WBC ADJUSTED: 6.3 10*9/L (ref 3.6–11.2)

## 2022-03-23 LAB — BASIC METABOLIC PANEL
ANION GAP: 4 mmol/L — ABNORMAL LOW (ref 5–14)
BLOOD UREA NITROGEN: 15 mg/dL (ref 9–23)
BUN / CREAT RATIO: 17
CALCIUM: 8.2 mg/dL — ABNORMAL LOW (ref 8.7–10.4)
CHLORIDE: 107 mmol/L (ref 98–107)
CO2: 30 mmol/L (ref 20.0–31.0)
CREATININE: 0.88 mg/dL
EGFR CKD-EPI (2021) MALE: >90 mL/min/{1.73_m2} (ref >=60)
GLUCOSE RANDOM: 123 mg/dL (ref 70–179)
POTASSIUM: 4 mmol/L (ref 3.4–4.8)
SODIUM: 141 mmol/L (ref 135–145)

## 2022-03-23 LAB — MAGNESIUM: MAGNESIUM: 1.8 mg/dL (ref 1.6–2.6)

## 2022-03-23 LAB — PHOSPHORUS: PHOSPHORUS: 2.1 mg/dL — ABNORMAL LOW (ref 2.4–5.1)

## 2022-03-23 MED ADMIN — docusate sodium (COLACE) capsule 100 mg: 100 mg | ORAL | @ 02:00:00

## 2022-03-23 MED ADMIN — insulin NPH (HumuLIN,NovoLIN) injection 10 Units: 10 [IU] | SUBCUTANEOUS | @ 14:00:00 | Stop: 2022-03-23

## 2022-03-23 MED ADMIN — insulin lispro (HumaLOG) injection 8 Units: 8 [IU] | SUBCUTANEOUS | @ 19:00:00

## 2022-03-23 MED ADMIN — metoPROLOL tartrate (LOPRESSOR) tablet 12.5 mg: 12.5 mg | ORAL | @ 02:00:00

## 2022-03-23 MED ADMIN — polyethylene glycol (MIRALAX) packet 17 g: 17 g | ORAL | @ 16:00:00

## 2022-03-23 MED ADMIN — melatonin tablet 6 mg: 6 mg | ORAL | @ 02:00:00

## 2022-03-23 MED ADMIN — colchicine (COLCRYS) tablet 0.6 mg: .6 mg | ORAL | @ 02:00:00 | Stop: 2022-03-25

## 2022-03-23 MED ADMIN — potassium chloride ER tablet 10 mEq: 10 meq | ORAL | @ 14:00:00

## 2022-03-23 MED ADMIN — insulin lispro (HumaLOG) injection 0-9 Units: 0-9 [IU] | SUBCUTANEOUS | @ 18:00:00

## 2022-03-23 MED ADMIN — polyethylene glycol (MIRALAX) packet 17 g: 17 g | ORAL | @ 22:00:00

## 2022-03-23 MED ADMIN — heparin (porcine) 5,000 unit/mL injection 5,000 Units: 5000 [IU] | SUBCUTANEOUS | @ 10:00:00

## 2022-03-23 MED ADMIN — polyethylene glycol (MIRALAX) packet 17 g: 17 g | ORAL | @ 14:00:00

## 2022-03-23 MED ADMIN — furosemide (LASIX) injection 20 mg: 20 mg | INTRAVENOUS | @ 16:00:00 | Stop: 2022-03-23

## 2022-03-23 MED ADMIN — aspirin chewable tablet 81 mg: 81 mg | ORAL | @ 14:00:00

## 2022-03-23 MED ADMIN — magnesium oxide (MAG-OX) tablet 400 mg: 400 mg | ORAL | @ 14:00:00

## 2022-03-23 MED ADMIN — acetaminophen (TYLENOL) tablet 1,000 mg: 1000 mg | ORAL | @ 10:00:00

## 2022-03-23 MED ADMIN — docusate sodium (COLACE) capsule 100 mg: 100 mg | ORAL | @ 15:00:00

## 2022-03-23 MED ADMIN — colchicine (COLCRYS) tablet 0.6 mg: .6 mg | ORAL | @ 14:00:00 | Stop: 2022-03-25

## 2022-03-23 MED ADMIN — insulin NPH (HumuLIN,NovoLIN) injection 10 Units: 10 [IU] | SUBCUTANEOUS | @ 02:00:00

## 2022-03-23 MED ADMIN — pravastatin (PRAVACHOL) tablet 40 mg: 40 mg | ORAL | @ 14:00:00

## 2022-03-23 MED ADMIN — mupirocin (BACTROBAN) 2 % ointment: NASAL | @ 02:00:00 | Stop: 2022-03-25

## 2022-03-23 MED ADMIN — magnesium oxide (MAG-OX) tablet 400 mg: 400 mg | ORAL | @ 02:00:00

## 2022-03-23 MED ADMIN — heparin (porcine) 5,000 unit/mL injection 5,000 Units: 5000 [IU] | SUBCUTANEOUS | @ 19:00:00

## 2022-03-23 MED ADMIN — mupirocin (BACTROBAN) 2 % ointment: NASAL | @ 14:00:00 | Stop: 2022-03-25

## 2022-03-23 MED ADMIN — insulin lispro (HumaLOG) injection 0-9 Units: 0-9 [IU] | SUBCUTANEOUS | @ 02:00:00

## 2022-03-23 MED ADMIN — gabapentin (NEURONTIN) capsule 300 mg: 300 mg | ORAL | @ 14:00:00

## 2022-03-23 MED ADMIN — insulin lispro (HumaLOG) injection 9 Units: 9 [IU] | SUBCUTANEOUS | @ 02:00:00

## 2022-03-23 MED ADMIN — tamsulosin (FLOMAX) 24 hr capsule 0.4 mg: .4 mg | ORAL | @ 02:00:00

## 2022-03-23 MED ADMIN — metoPROLOL tartrate (LOPRESSOR) tablet 12.5 mg: 12.5 mg | ORAL | @ 10:00:00

## 2022-03-23 MED ADMIN — heparin (porcine) 5,000 unit/mL injection 5,000 Units: 5000 [IU] | SUBCUTANEOUS | @ 02:00:00

## 2022-03-23 MED ADMIN — naloxegoL (MOVANTIK) tablet 25 mg: 25 mg | ORAL | @ 14:00:00 | Stop: 2022-03-26

## 2022-03-23 MED ADMIN — acetaminophen (TYLENOL) tablet 1,000 mg: 1000 mg | ORAL | @ 22:00:00

## 2022-03-23 MED ADMIN — acetaminophen (TYLENOL) tablet 1,000 mg: 1000 mg | ORAL | @ 02:00:00

## 2022-03-23 MED ADMIN — gabapentin (NEURONTIN) capsule 300 mg: 300 mg | ORAL | @ 02:00:00

## 2022-03-23 MED ADMIN — acetaminophen (TYLENOL) tablet 1,000 mg: 1000 mg | ORAL | @ 16:00:00

## 2022-03-23 MED ADMIN — clopidogreL (PLAVIX) tablet 75 mg: 75 mg | ORAL | @ 14:00:00

## 2022-03-23 MED ADMIN — sertraline (ZOLOFT) tablet 100 mg: 100 mg | ORAL | @ 14:00:00

## 2022-03-23 MED ADMIN — traZODone (DESYREL) tablet 150 mg: 150 mg | ORAL | @ 02:00:00

## 2022-03-23 NOTE — Unmapped (Addendum)
Endocrine Team Diabetes Follow Up Consult Note     Consult information:  Requesting Attending Physician : Arlester Marker, MD  Service Requesting Consult : Surg Cardiac [facility])  Primary Care Provider: Amada Kingfisher, MD  Impression:  Christopher [name] is a 71 y.o. male admitted for 2v CABG (LIMA to LAD, RSVG to OM), now s/p 11/27. We have been consulted at the request of Arlester Marker, MD to evaluate Christopher [name] for hyperglycemia.     Medical Decision Making:  Diagnoses:  1.Type 2 Diabetes. Uncontrolled With hyperglycemia.   2. Nutrition: Complicating glycemic control. Increasing risk for both hypoglycemia and hyperglycemia.  3. Coronary Artery Disease. Complicating glycemic control and increasing risk for hyperglycemia and hypoglycemia.  4. Obesity. Complicating glycemic control and increasing risk for hyperglycemia.  5. Chronic Kidney Disease. Complicating glycemic control and increasing risk for hypoglycemia.  6. Catecholamine infusion. Complicating glycemic control and increasing risk for hyperglycemia.      Studies reviewed 03/23/22:  Labs: POCT-BG and HbA1C  Interpretation: Generally well-controlled BGs, isolated hyperglycemia, ranging 157-197 mg/dL  in the past 24 hours. A1C 7.9  (12/22/21) indicating fair outpatient glycemic management.  Notes reviewed: Primary team and nursing notes      Overall impression based on above reviews and history:  Pt with T2DM s/p 2vCABG 11/27. He did well with past revisions. Poor PO intake per pt, states no appetite. Best meal yesterday was half his dinner which was a hamburger. For today will trial decreasing regimen overall by 10%. No further changes. Will continue to monitor.      Recommendations:  - Adjust to NPH 9 units Q12H  - Adjust to Lispro 8 units TIDAC  - Lispro 1:30 > 140 ACHS  - Hypoglycemia protocol.  - POCT-BG achs.  - Ensure patient is on glucose precautions if patient taking nutrition by mouth.     Discharge planning:  In process. Will complete closer to discharge. Likely resume home regimen given current A1C. We briefly discussed starting a GLP1ra such as ozempic for dual cardiac benefit and glycemic management however depending on pt's preference he can follow-up with this outpatient.    Thank you for this consult. Discussed plan with primary team. We will continue to follow and make recommendations and place orders as appropriate.    Please page with questions or concerns: Karna Christmas, NP: [phone]  Endocrinology Diabetes Care Team on call from 6AM - 3PM on weekdays then endocrine fellow on call: [phone] from 3PM - 6AM on weekdays and on weekends and holidays.   If APP cannot be reached, please page the endocrine fellow on call.      Subjective:  Interval Encounter:  Pt more alert today but states he is doing okay, denies n/v. Not the best appetite at this time. No acute concerns or questions noted at time of rounds.    Initial HPI:  Christopher [name] is a 71 y.o. male with past medical history of accelerating angina, native coronary artery disease (LAD and OM), HLD, T2DM (HgbA1C 7.9), HTN, statin intolerance, TIA who presents for evaluation for Coronary Artery Bypass Grafting. Mr. Harvel Quale reports that he has CP/Angina every day, even at rest. However, this is worsened by exertion. He denies other associated symptoms.    He is now s/p CABGx2 (LIMA to LAD, RSVG to OM) 11/27       Diabetes History:  Patient has a history of Type 2 diabetes diagnosed about 10 years ago.  Diabetes is managed  by: PCP.  Current home diabetes regimen: metformin 1000 mg BID AC.  Current home blood glucose monitoring: daily.  Hypoglycemia awareness: Aware.  Complications related to diabetes: retinopathy and chronic kidney disease      Current Nutrition:  Active Orders   Diet    Nutrition Therapy Regular/House       ROS: As per HPI.     acetaminophen  1,000 mg Oral QID    aspirin  81 mg Oral Daily    clopidogreL  75 mg Oral Daily    colchicine  0.6 mg Oral BID    docusate sodium  100 mg Oral BID    gabapentin  300 mg Oral BID    heparin (porcine) for subcutaneous use  5,000 Units Subcutaneous Q8H SCH    insulin lispro  0-9 Units Subcutaneous ACHS    insulin lispro  9 Units Subcutaneous 3xd Meals    insulin NPH  10 Units Subcutaneous Q12H SCH    lidocaine  3 patch Transdermal Daily    magnesium oxide  400 mg Oral BID    melatonin  6 mg Oral QPM    metoPROLOL tartrate  12.5 mg Oral Q8H SCH    mupirocin   Each Nare BID    naloxegoL  25 mg Oral Daily    polyethylene glycol  17 g Oral 3xd Meals    potassium chloride  10 mEq Oral Daily    pravastatin  40 mg Oral Daily    sertraline  100 mg Oral Daily    tamsulosin  0.4 mg Oral Nightly    traZODone  150 mg Oral Nightly       Current Outpatient Medications   Medication Instructions    alirocumab 75 mg/mL PnIj Inject the contents of 1 pen (75 mg) under the skin every fourteen (14) days.    amlodipine (NORVASC) 10 mg, Oral, Daily (standard), For high blood pressure    aspirin (ECOTRIN) 81 mg, Oral, Daily (standard)    blood sugar diagnostic (ONETOUCH ULTRA TEST) Strp USE ONCE DAILY TO CHECK FASTING BLOOD GLUCOSE    blood-glucose meter Misc 1 Device, Intradermal, Daily (standard), DX: E11.8    clopidogreL (PLAVIX) 75 mg, Oral, Daily (standard)    diclofenac sodium (VOLTAREN) 1 % gel APPLY 2 GRAMS TO AFFECTED  AREA(S) TOPICALLY 4 TIMES DAILY    diclofenac sodium (VOLTAREN) 2 g, Topical, 4 times a day    empty container (SHARPS-A-GATOR DISPOSAL SYSTEM) Misc Use as directed for sharps disposal    fluticasone propionate (FLONASE) 50 mcg/actuation nasal spray 2 sprays, Each Nare, Daily (standard)    gabapentin (NEURONTIN) 300 MG capsule TAKE 1 CAPSULE BY MOUTH TWICE  DAILY    isosorbide mononitrate (IMDUR) 30 mg, Oral, 2 times a day    metFORMIN (GLUCOPHAGE) 1,000 mg, Oral, 2 times a day with meals    metoPROLOL succinate (TOPROL-XL) 100 mg, Oral, Daily (standard)    nitroglycerin (NITROSTAT) 0.4 mg, Sublingual, Every 5 min PRN    omeprazole (PRILOSEC) 20 mg, Oral, Daily (standard)    pravastatin (PRAVACHOL) 10 mg, Oral, Daily (standard)    sertraline (ZOLOFT) 100 mg, Oral, Daily (standard)    tamsulosin (FLOMAX) 0.4 mg, Oral, Daily (standard)    traZODone (DESYREL) 150 mg, Oral, Nightly    trospium (SANCTURA) 20 mg, Oral, Nightly           Past Medical History:   Diagnosis Date    Acute kidney injury (CMS-HCC) 01/12/2018    Anemia     IRON  DEFICIENT     Anxiety dont know    Arthritis     Basal cell carcinoma     Cataract     CHF (congestive heart failure) (CMS-HCC)     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    Corneal abrasion 1975    steel in right eye with abrasion and removal    Cubital tunnel syndrome on left 01/16/2019    Depression 2016    Diabetes mellitus (CMS-HCC)     GERD (gastroesophageal reflux disease) 1982    Hypertension     Meningitis 12/25/2015    Mixed hyperlipidemia     Myocardial infarction (CMS-HCC) 1986    Obesity 1986    first back surgery    Peptic ulceration        Past Surgical History:   Procedure Laterality Date    BACK SURGERY      CARDIAC CATHETERIZATION      STENTS PLACEMENT     CARDIAC SURGERY  2005    stents    CORONARY STENT PLACEMENT      FOREIGN BODY REMOVAL Right about 20 years ago    HERNIA REPAIR      RIGHT INGUNIAL    JOINT REPLACEMENT Left     TOTAL SHOULDER    PR ARM/ELBOW TENDON LENGTHEN,SINGLE,EA Left 02/17/2019    Procedure: TENDON LENGTHENING UPPER ARM/ELBOW SNGL EA;  Surgeon: Daisy Lazar, MD;  Location: ASC OR [facility];  Service: Orthopedics    PR CABG, ARTERIAL, SINGLE Midline 03/20/2022    Procedure: CORONARY ARTERY BYPASS, USING ARTERIAL GRAFT(S); SINGLE ARTERIAL GRAFT;  Surgeon: Arlester Marker, MD;  Location: MAIN OR [facility];  Service: Cardiac Surgery    PR CABG, ARTERY-VEIN, SINGLE Midline 03/20/2022    Procedure: CORONARY ARTERY BYPASS, USING VENOUS GRAFT(S) AND ARTERIAL GRAFT(S); SINGLE VEIN GRAFT;  Surgeon: Arlester Marker, MD;  Location: MAIN OR [facility]; Service: Cardiac Surgery    PR CATH PLACE/CORON ANGIO, IMG SUPER/INTERP,W LEFT HEART VENTRICULOGRAPHY N/A 02/01/2022    Procedure: Left Heart Catheterization;  Surgeon: Jacquelyne Balint, MD;  Location: [facility] CATH;  Service: Cardiology    PR ENDOSCOPY W/VIDEO-ASST VEIN HARVEST,CABG Right 03/20/2022    Procedure: ENDOSCOPY, SURGICAL, INCLUDING VIDEO-ASSISTED HARVEST OF VEIN(S) FOR CORONARY ARTERY BYPASS PROCEDURE;  Surgeon: Arlester Marker, MD;  Location: MAIN OR [facility];  Service: Cardiac Surgery    PR OSTEOTOMY 1ST METATARSAL,BASE/SHAFT Left 07/20/2017    Procedure: OSTEOTOMY, W/WO LENGTHENING, SHORTENING OR ANGULAR CORRECTION, METATARSAL; FIRST METATARSAL;  Surgeon: Valda Favia, MD;  Location: ASC OR [facility];  Service: Ortho Foot & Ankle    PR OSTEOTOMY HEEL BONE Left 07/20/2017    Procedure: OSTEOTOMYLoistine Chance W/WO INT FIXA;  Surgeon: Valda Favia, MD;  Location: ASC OR [facility];  Service: Ortho Foot & Ankle    PR OSTEOTOMY METATARSAL (NOT 1ST) Left 06/06/2018    Procedure: OSTEOTOMY, W/WO LENGTHENING, SHORTENING OR ANGULAR CORRECTION, METATARSAL; OTHER THAN FIRST METATARSAL, EA;  Surgeon: Valda Favia, MD;  Location: ASC OR [facility];  Service: Orthopedics    PR PART REMV PHALANX OF TOE Left 06/06/2018    Procedure: PART EXC BONE PHALANX TOE;  Surgeon: Valda Favia, MD;  Location: ASC OR [facility];  Service: Orthopedics    PR RECONSTR TOTAL SHOULDER IMPLANT Left 11/12/2018    Procedure: R28   ARTHROPLASTY, GLENOHUMERAL JOINT; TOTAL SHOULDER(GLENOID & PROXIMAL HUMERAL REPLACEMENT(EG, TOTAL SHOULDER);  Surgeon: Nickola Major, MD;  [facility]: [facility] OR [facility];  Service: Orthopedics    PR REMOVAL DEEP  IMPLANT Left 11/12/2018    Procedure: REMOVE IMPLANT; DEEP SHOULDER;  Surgeon: Nickola Major, MD;  Location: [facility] OR [facility];  Service: Orthopedics    PR REPAIR BICEPS LONG TENDON Left 11/12/2018    Procedure: TENODESIS LONG TENDON BICEPS;  Surgeon: Nickola Major, MD;  Location: [facility] OR [facility];  Service: Orthopedics    PR REPAIR PERONEAL TENDONS,FIB OSTEOTMY Left 07/20/2017    Procedure: REPR DISLOC PERONEAL TENDONS; W/FIB OSTEOTOMY;  Surgeon: Valda Favia, MD;  Location: ASC OR [facility];  Service: Ortho Foot & Ankle    PR REVISE MEDIAN N/CARPAL TUNNEL SURG Left 02/17/2019    Procedure: R22 NEUROPLASTY AND/OR TRANSPOSITION; MEDIAN NERVE AT CARPAL TUNNEL;  Surgeon: Daisy Lazar, MD;  Location: ASC OR [facility];  Service: Orthopedics    PR REVISE ULNAR NERVE AT ELBOW Left 02/17/2019    Procedure: NEUROPLASTY AND/OR TRANSPOSITION; ULNAR NERVE AT ELBOW;  Surgeon: Daisy Lazar, MD;  Location: ASC OR [facility];  Service: Orthopedics    SHOULDER SURGERY Bilateral     L TSA, R ROTATOR CUFF      SKIN BIOPSY      SPINE SURGERY  1986/1995/2000/2002    LUMBAR FUSION    stent         Family History   Problem Relation Age of Onset    Diabetes Mother     Stroke Mother     Cataracts Mother     Hypertension Mother     Alcohol abuse Father     Cancer Father     Diabetes Father     Cataracts Father     Hypertension Father     Melanoma Father     Hypertension Maternal Grandmother     Cancer Maternal Grandmother     Hypertension Maternal Grandfather     Hypertension Paternal Grandmother     Cataracts Paternal Grandfather     Hypertension Paternal Grandfather     COPD Brother         moker and alcholic    Early death Brother     COPD Sister         was a smoker    Diabetes Sister     COPD Brother         moker    Diabetes Brother     Diabetes Sister     Kidney disease Maternal Aunt         died from kidney diease due to diabeties    No Known Problems Maternal Uncle     No Known Problems Paternal Aunt     No Known Problems Paternal Uncle     No Known Problems Other     Blindness Neg Hx     Thyroid disease Neg Hx     Macular degeneration Neg Hx     Glaucoma Neg Hx     Basal cell carcinoma Neg Hx     Squamous cell carcinoma Neg Hx     Anesthesia problems Neg Hx     Broken bones Neg Hx     Clotting disorder Neg Hx     Collagen disease Neg Hx     Dislocations Neg Hx     Fibromyalgia Neg Hx     Gout Neg Hx     Hemophilia Neg Hx     Osteoporosis Neg Hx     Rheumatologic disease Neg Hx     Scoliosis Neg Hx     Severe sprains Neg Hx  Sickle cell anemia Neg Hx     Spinal Compression Fracture Neg Hx        Social History     Tobacco Use    Smoking status: Never    Smokeless tobacco: Never    Tobacco comments:     never will   Vaping Use    Vaping Use: Never used   Substance Use Topics    Alcohol use: No     Alcohol/week: 0.0 standard drinks of alcohol    Drug use: Not Currently       OBJECTIVE:  BP 125/61  - Pulse 80  - Temp 36.9 ??C (98.4 ??F) (Oral)  - Resp 19  - Ht 167.6 cm (5' 5.98)  - Wt 88.9 kg (195 lb 15.8 oz)  - SpO2 97%  - BMI 31.65 kg/m??   Wt Readings from Last 12 Encounters:   03/23/22 88.9 kg (195 lb 15.8 oz)   03/02/22 83.6 kg (184 lb 3.2 oz)   03/01/22 83 kg (183 lb)   02/21/22 81.2 kg (179 lb)   02/01/22 81.2 kg (179 lb)   01/31/22 81.6 kg (179 lb 12.8 oz)   12/22/21 81.6 kg (180 lb)   11/16/21 81.9 kg (180 lb 9.6 oz)   08/16/21 87.6 kg (193 lb 3.2 oz)   08/09/21 86.2 kg (190 lb 0.3 oz)   05/24/21 90.3 kg (199 lb)   05/11/21 88.9 kg (196 lb)     Physical Exam  Vitals and nursing note reviewed.   Constitutional:       General: He is not in acute distress.     Appearance: He is not ill-appearing.      Comments: drowsy   HENT:      Head: Normocephalic and atraumatic.   Cardiovascular:      Rate and Rhythm: Normal rate and regular rhythm.   Pulmonary:      Effort: Pulmonary effort is normal. No respiratory distress.      Comments: Supplemental O2 via [place]  Skin:     General: Skin is warm and dry.      Comments: Surgical incisions, mid-sternal, s/p CABG 11/27   Neurological:      Mental Status: He is oriented to person, place, and time.             BG/insulin reviewed per EMR.   Glucose, POC (mg/dL)   Date Value   16/01/9603 157   03/22/2022 170   03/22/2022 197 (H) 03/21/2022 115   03/21/2022 87   03/21/2022 104   03/21/2022 101   03/21/2022 139   12/28/2015 121   12/28/2015 139   12/28/2015 136   12/27/2015 118   12/27/2015 113   12/27/2015 116   12/27/2015 149   12/26/2015 178        Summary of labs:  Lab Results   Component Value Date    A1C 7.9 (H) 12/22/2021    A1C 7.8 (H) 08/09/2021    A1C 7.7 (H) 04/05/2021     Lab Results   Component Value Date    CREATININE 0.92 03/22/2022     Lab Results   Component Value Date    WBC 8.3 03/22/2022    HGB 9.6 (L) 03/22/2022    HCT 27.9 (L) 03/22/2022    PLT 104 (L) 03/22/2022       Lab Results   Component Value Date    NA 138 03/22/2022    NA 135 03/22/2022    K 4.5 03/22/2022  K 4.3 03/22/2022    CL 106 03/22/2022    CO2 26.0 03/22/2022    BUN 15 03/22/2022    CREATININE 0.92 03/22/2022    GLU 141 03/22/2022    CALCIUM 8.9 03/22/2022    MG 1.7 03/22/2022    PHOS 3.0 03/22/2022       Lab Results   Component Value Date    BILITOT 0.6 03/20/2022    BILIDIR 0.20 03/20/2022    PROT 5.8 03/20/2022    ALBUMIN 3.5 03/20/2022    ALT 12 03/20/2022    AST 31 03/20/2022    ALKPHOS 41 (L) 03/20/2022

## 2022-03-23 NOTE — Unmapped (Signed)
VSS. Alert and oriented. Transferred from TICU at 1600. NSR. 3L [place]. Denies pain. TOV.Resting comfortably between care.   Problem: Adult Inpatient Plan of Care  Goal: Plan of Care Review  Outcome: Progressing  Goal: Patient-Specific Goal (Individualized)  Outcome: Progressing  Goal: Absence of Hospital-Acquired Illness or Injury  Outcome: Progressing  Intervention: Identify and Manage Fall Risk  Recent Flowsheet Documentation  Taken 03/22/2022 1600 by Cristy Hilts, RN  Safety Interventions:   aspiration precautions   low bed   lighting adjusted for tasks/safety   nonskid shoes/slippers when out of bed   bleeding precautions  Intervention: Prevent Skin Injury  Recent Flowsheet Documentation  Taken 03/22/2022 1600 by Cristy Hilts, RN  Positioning for Skin: Supine/Back  Device Skin Pressure Protection:   absorbent pad utilized/changed   adhesive use limited  Skin Protection:   adhesive use limited   incontinence pads utilized   tubing/devices free from skin contact  Intervention: Prevent and Manage VTE (Venous Thromboembolism) Risk  Recent Flowsheet Documentation  Taken 03/22/2022 1600 by Cristy Hilts, RN  Anti-Embolism Device Type: SCD, Knee  Anti-Embolism Intervention: Refused  Anti-Embolism Device Location: BLE  Intervention: Prevent Infection  Recent Flowsheet Documentation  Taken 03/22/2022 1600 by Cristy Hilts, RN  Infection Prevention: equipment surfaces disinfected  Goal: Optimal Comfort and Wellbeing  Outcome: Progressing  Goal: Readiness for Transition of Care  Outcome: Progressing  Goal: Rounds/Family Conference  Outcome: Progressing     Problem: Wound  Goal: Optimal Coping  Outcome: Progressing  Goal: Optimal Functional Ability  Outcome: Progressing  Goal: Absence of Infection Signs and Symptoms  Outcome: Progressing  Intervention: Prevent or Manage Infection  Recent Flowsheet Documentation  Taken 03/22/2022 1600 by Cristy Hilts, RN  Infection Management: aseptic technique maintained  Goal: Improved Oral Intake  Outcome: Progressing  Goal: Optimal Pain Control and Function  Outcome: Progressing  Goal: Skin Health and Integrity  Outcome: Progressing  Intervention: Optimize Skin Protection  Recent Flowsheet Documentation  Taken 03/22/2022 1600 by Cristy Hilts, RN  Pressure Reduction Techniques:   frequent weight shift encouraged   weight shift assistance provided  Head of Bed (HOB) Positioning: HOB at 30 degrees  Pressure Reduction Devices: pressure-redistributing mattress utilized  Skin Protection:   adhesive use limited   incontinence pads utilized   tubing/devices free from skin contact  Goal: Optimal Wound Healing  Outcome: Progressing     Problem: Skin Injury Risk Increased  Goal: Skin Health and Integrity  Outcome: Progressing  Intervention: Optimize Skin Protection  Recent Flowsheet Documentation  Taken 03/22/2022 1600 by Cristy Hilts, RN  Pressure Reduction Techniques:   frequent weight shift encouraged   weight shift assistance provided  Head of Bed (HOB) Positioning: HOB at 30 degrees  Pressure Reduction Devices: pressure-redistributing mattress utilized  Skin Protection:   adhesive use limited   incontinence pads utilized   tubing/devices free from skin contact     Problem: Fall Injury Risk  Goal: Absence of Fall and Fall-Related Injury  Outcome: Progressing  Intervention: Promote Scientist, clinical (histocompatibility and immunogenetics) Documentation  Taken 03/22/2022 1600 by Cristy Hilts, RN  Safety Interventions:   aspiration precautions   low bed   lighting adjusted for tasks/safety   nonskid shoes/slippers when out of bed   bleeding precautions     Problem: Self-Care Deficit  Goal: Improved Ability to Complete Activities of Daily Living  Outcome: Progressing

## 2022-03-23 NOTE — Unmapped (Addendum)
CARDIAC SURGERY DISCHARGE SUMMARY        Admit date: 03/20/2022    Discharge date and time: 03/27/22 @ 1200    Discharge to:  Home     Discharge Service: SRS    Discharge Attending Physician: Arlester Marker, MD    Primary Care Physician: Amada Kingfisher, MD    Discharge  Diagnoses:   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)    Opioid dependence with current use (CMS-HCC)    TIA (transient ischemic attack)    Accelerating angina (CMS-HCC)      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    Corneal abrasion 1975    steel in right eye with abrasion and removal    Cubital tunnel syndrome on left 01/16/2019    Depression 2016    Diabetes mellitus (CMS-HCC)     GERD (gastroesophageal reflux disease) 1982    Hypertension     Meningitis 12/25/2015    Mixed hyperlipidemia     Myocardial infarction (CMS-HCC) 1986    Obesity 1986    first back surgery    Peptic ulceration         OR Procedures:   03/20/2022 by Dr. Sindy Guadeloupe:  1. Coronary Artery Bypass grafting of the LIMA to LAD, RSVG to OM.  CAB x 2    2. Endoscopic vein harvest of the   right  lower extremity    Ancillary Procedures:  None    History of Present Illness:   Christopher [name] is a 71 y.o. male w/ angina, CAD (LAD and OM), HLD, T2DM (HgbA1C 7.9), HTN, statin intolerance, TIA.     Hospital Course:  The patient underwent CABG x2 on 03/20/2022, tolerated the procedure well, and recovered in the ICU. Patient was extubated on POD#0. Chest tubes, pacing wires, and drains were removed in the following days. Post-op course was significant for the following:    Post-op rhythm issues: none  Anticoagulation: None   Patient was initiated on dual anti-platelet therapy s/p CABG with ASA + Plavix once pacing wires were removed. Plavix duration will be 30 days from time of discharge and then can be discontinued. ASA will continue indefinitely.      Prior to discharge, the patient was able to void spontaneously, have a bowel movement, ambulate, tolerate a diet, and have their pain controlled with oral pain medication. Physical therapy cleared the patient for discharge. At the time of discharge, the patient and family were counseled on the importance of joining a Cardiac Rehab program, and a referral will be made after their first clinic follow up visit.      Patient was counseled on smoking cessation and/or  continued abstinence from tobacco as a strategy to reduce risk of cardiovascular disease and events. Patient participated in the counseling and verbalized understanding of the serious risks of using nicotine products.     EF estimated: 55%  CABG: discharged on BB, ASA, and statin? yes    Discharge day services:  Patient was evaluated by the Cardiac Surgery team on the day of discharge and met the criteria for discharge. The patient remained afebrile with stable vital signs. Laboratory values were within normal limits. Surgical wounds were inspected and patient's physical exam was benign and unchanged. Discharge plan was discussed, instructions were given and all questions answered.    Pertinent Diagnostics:  Most Recent Labs:  Recent Labs   Lab Units 03/27/22  0441   SODIUM mmol/L 139   POTASSIUM mmol/L 4.2   CHLORIDE mmol/L 104   CO2 mmol/L 30.0   BUN mg/dL 19   CREATININE mg/dL 7.32   CALCIUM mg/dL 9.6   MAGNESIUM mg/dL 1.9   PHOSPHORUS mg/dL 4.5      Lab Results   Component Value Date    BILITOT 0.6 03/20/2022    BILITOT 0.4 03/01/2022    BILIDIR 0.20 03/20/2022    ALT 12 03/20/2022    ALT 14 03/01/2022    AST 31 03/20/2022    AST 21 03/01/2022    ALKPHOS 41 (L) 03/20/2022    ALKPHOS 61 03/01/2022    PROT 5.8 03/20/2022    PROT 7.7 03/01/2022    ALBUMIN 3.5 03/20/2022    ALBUMIN 4.1 03/01/2022        Recent Labs   Lab Units 03/27/22  0440   HEMOGLOBIN g/dL 9.4*   HEMATOCRIT % 20.2*   PLATELET COUNT (1) 10*9/L 212     No results in the last day    Invalid input(s): PTPATIENT   Recent Labs   Lab Units 03/27/22  0923 03/26/22  2029 03/26/22  1619 03/26/22  1132   POC GLUCOSE mg/dL 542 [phone]       Diagnostic Studies:    CXR 12/4:  No significant issues    TTE 11/8:    1. The left ventricle is normal in size with normal wall thickness.    2. The left ventricular systolic function is normal, LVEF is visually  estimated at > 55%.    3. There is grade I diastolic dysfunction (impaired relaxation).    4. The aortic valve is trileaflet with mildly thickened leaflets with normal  excursion.    5. The right ventricle is normal in size, with normal systolic function    Medications at Discharge:     Your Medication List        STOP taking these medications      amlodipine 10 MG tablet  Commonly known as: NORVASC     blood-glucose meter Misc     empty container Misc  Commonly known as: SHARPS-A-GATOR DISPOSAL SYSTEM     isosorbide mononitrate 30 MG 24 hr tablet  Commonly known as: IMDUR     metoPROLOL succinate 100 MG 24 hr tablet  Commonly known as: Toprol-XL     nitroglycerin 0.4 MG SL tablet  Commonly known as: NITROSTAT     ONETOUCH ULTRA TEST Strp  Generic drug: blood sugar diagnostic            START taking these medications      docusate sodium 100 MG capsule  Commonly known as: COLACE  Take 1 capsule (100 mg total) by  mouth two (2) times a day.     furosemide 20 MG tablet  Commonly known as: LASIX  Take 1 tablet (20 mg total) by mouth daily for 7 days.  Start taking on: March 28, 2022     metoPROLOL tartrate 25 MG tablet  Commonly known as: LOPRESSOR  Take 0.5 tablets (12.5 mg total) by mouth two (2) times a day.     oxyCODONE 5 MG immediate release tablet  Commonly known as: ROXICODONE  Take 1 tablet (5 mg total) by mouth every six (6) hours as needed for up to 7 days.     OZEMPIC 0.25 mg or 0.5 mg (2 mg/3 mL) Pnij  Generic drug: semaglutide  Inject 0.25 mg under the skin every seven (7) days for 28 days, THEN 0.5 mg every seven (7) days for 28 days.  Start taking on: March 27, 2022     OZEMPIC 1 mg/dose (4 mg/3 mL) Pnij injection  Generic drug: semaglutide  Inject 1 mg under the skin every seven (7) days for 4 doses.  Start taking on: May 19, 2022            CONTINUE taking these medications      aspirin 81 MG tablet  Commonly known as: ECOTRIN  Take 1 tablet (81 mg total) by mouth daily.     clopidogreL 75 mg tablet  Commonly known as: PLAVIX  Take 1 tablet (75 mg total) by mouth daily.     diclofenac sodium 1 % gel  Commonly known as: VOLTAREN  Apply 2 g topically four (4) times a day.     fluticasone propionate 50 mcg/actuation nasal spray  Commonly known as: FLONASE  2 sprays into each nostril daily.     gabapentin 300 MG capsule  Commonly known as: NEURONTIN  TAKE 1 CAPSULE BY MOUTH TWICE  DAILY     metFORMIN 1000 MG tablet  Commonly known as: GLUCOPHAGE  Take 1 tablet (1,000 mg total) by mouth in the morning and 1 tablet (1,000 mg total) in the evening. Take with meals.     omeprazole 20 MG capsule  Commonly known as: PriLOSEC  Take 1 capsule (20 mg total) by mouth daily.     PRALUENT PEN 75 mg/mL Pnij  Generic drug: alirocumab  Inject the contents of 1 pen (75 mg) under the skin every fourteen (14) days.     pravastatin 10 MG tablet  Commonly known as: PRAVACHOL  Take 1 tablet (10 mg total) by mouth daily.     sertraline 100 MG tablet  Commonly known as: ZOLOFT  Take 1 tablet (100 mg total) by mouth daily.     tamsulosin 0.4 mg capsule  Commonly known as: FLOMAX  Take 1 capsule (0.4 mg total) by mouth daily.     traZODone 150 MG tablet  Commonly known as: DESYREL  Take 1 tablet (150 mg total) by mouth nightly.     trospium 20 mg tablet  Commonly known as: SANCTURA  Take 1 tablet (20 mg total) by mouth nightly.              Discharge Instructions:  1. Modified Sternal Precautions: No heavy pushing/ pullingwith your arms (less than 10lbs). If arm movements cause pain, then stop.  2. Do not restart previous BP medications unless instructed to do so.  3. Do not drive for 4-6 weeks from date of surgery.Ask the heart surgery team when you will be allowed to drive.  4. Pt may shower per  normal routine, but do not soak in a tub of water until cleared at clinic visit.  5. Wash sternal incision with soap and water once each day.  6. Please use incentive spirometry 10 x a day at minimum.  7. Call the Cardiac Surgery Office (24 hours a day) at [phone] if temp>101.5, pain uncontrolled with po pain medications, erythema/edema/purulent drainage from wound, uncontrolled nausea or vomiting, or any questions or concerns.  8. Weigh yourself every morning and record.  9. Call the Cardiac Surgery Office [phone] with any signs of weight gain, increased shortness of breath, and increasing lower extremity edema/ leg swelling.  10. Please walk every day for exercise. Try to walk further each day.  11. You may begin a Cardiac Rehab program 6 weeks after surgery. The referral to cardiac rehab will be discussed at your clinic appointment with the heart surgery team.  Other Instructions       Discharge instructions      BANDAGES:   -- When you get home, you can remove any remaining bandages and shower daily. Do not submerge in a bath or go swimming. Do not use lotion or ointments directly over your incisions for 6 weeks.  -- If your chest tube sites have some drainage, this is normal. Keep the area clean and dry. You can use a dry gauze and tape or band-aid over the area. If you cover these, please change the dressing twice/day or for any soilage of the dressing. Preferred to keep the area open to air if possible.   -- If your sternal (chest) incision or any wounds on your legs begin to leak, this could be serious. Please call the surgeon's office for this and report the drainage. [phone].    LIFTING:   -- Please limit lifting to under 10 lbs (roughly a full gallon of milk) for the next 8 weeks while your chest bone heals. No pushing or pulling either during this time.     DRIVING:   -- No driving until cleared by cardiac surgery. We discuss this at the first post-op visit.     RECOVERY:   -- Walk every day for exercise, short walks at first and then longer as you recover. There is no perfect answer to how far you should be walking. You should be progressing slowly longer distances.  -- Continue to use the incentive spirometer several times per day while at home. This helps your lungs recover.  -- We will discuss cardiac rehab & make a referral at your clinic visit.    -- It is helpful to keep a log of your blood pressures once you are home if possible. Try and record a few times a day at the same time each day. Bring this log to your first clinic appointment.    PAIN:  -- You may have been prescribed a narcotic pain medication to take if you need it for severe pain. You should take tylenol (acetaminophen) for pain first, and then if needed take the narcotic. You can take up to 3000mg  everyday of tylenol unless you have been told by a health care provider to not take tylenol.    QUESTIONS:   -- Call the Cardiac Surgery Office (24 hours a day) at [phone] if temp>101.5, uncontrolled pain, redness or pain or drainage from your surgical wounds, trouble breathing, or any questions or concerns.   -- Call 911 if you have severe discomfort/pain or shortness of breath.       Cardiac  Surgery Clinic Visit Process:     1. -- Check in on the ground floor of the [facility] at Registration.     2. -- Labs: You may or may not be ordered to have labs. If you see this on your orders, please obtain lab-work also in the lobby after you check in. Look for signs for Phlebotomy/Laboratory.     3. -- Chest x-rays are done in the basement of the [facility] in Radiology. If you are a post-op visit, you will have a chest xray ordered that needs to be completed PRIOR to seeing the provider.    4. -- EKGs are done on the 2nd floor of [facility] in Cardiac Services. Look for & follow signs for the cath lab. Cardiac services is just pass the cath lab on your left down the hallway. If you are a post-op patient, you will have an EKG ordered. Please obtain PRIOR to seeing the provider.    5. -- After tests are complete, you will go to the clinic to see a provider. If your appointment is scheduled on a Tuesday, the clinic is on the 4th floor [facility]) in the Transplant Clinic. If your appointment is scheduled on a Wednesday or Thursday, you will present to the Multispecialty Surgery Clinic which is located on the 1st floor in the [facility].    If at any time you are lost or turned around, please ask any [employer] [facility] employee for help or call our nurse coordinator Amil Amen, at [phone] or the Cardiothoracic Office at [phone].            Diet:   Low Sodium/ Heart Healthy      Future Appointments:  Appointments which have been scheduled for you      May 08, 2022  3:00 PM  (Arrive by 2:45 PM)  RETURN  GENERAL with Lorretta Harp, MD  [facility] CARDIOLOGY PRIMARY CARE [facility] REG[facility]) [address]  [address]  [place] [address]  [phone]           Cardiac Surgery office will call you with an appointment in 1-2 wks.  Please call with any questions or changes.

## 2022-03-23 NOTE — Unmapped (Signed)
CARDIAC SURGERY PROGRESS NOTE    SUBJECTIVE:  Patient transferred out of the TICU overnight. Doing well overall. Three chest tubes remain for high output, transition to waterseal today.   IV diuresis x1 dose today.    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)    Opioid dependence with current use (CMS-HCC)    TIA (transient ischemic attack)    Accelerating angina (CMS-HCC)      MEDICATIONS:  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    Corneal abrasion 1975    steel in right eye with abrasion and removal    Cubital tunnel syndrome on left 01/16/2019    Depression 2016    Diabetes mellitus (CMS-HCC)     GERD (gastroesophageal reflux disease) 1982    Hypertension     Meningitis 12/25/2015    Mixed hyperlipidemia     Myocardial infarction (CMS-HCC) 1986    Obesity 1986    first back surgery    Peptic ulceration        Scheduled Meds:   acetaminophen  1,000 mg Oral QID    aspirin  81 mg Oral Daily    clopidogreL  75 mg Oral Daily    colchicine  0.6 mg Oral BID    docusate sodium  100 mg Oral BID    gabapentin  300 mg Oral BID    heparin (porcine) for subcutaneous use  5,000 Units Subcutaneous Q8H SCH    insulin lispro  0-9 Units Subcutaneous ACHS    insulin lispro  9 Units Subcutaneous 3xd Meals    insulin NPH  10 Units Subcutaneous Q12H SCH lidocaine  3 patch Transdermal Daily    magnesium oxide  400 mg Oral BID    melatonin  6 mg Oral QPM    metoPROLOL tartrate  12.5 mg Oral Q8H SCH    mupirocin   Each Nare BID    naloxegoL  25 mg Oral Daily    polyethylene glycol  17 g Oral 3xd Meals    potassium chloride  10 mEq Oral Daily    pravastatin  40 mg Oral Daily    sertraline  100 mg Oral Daily    tamsulosin  0.4 mg Oral Nightly    traZODone  150 mg Oral Nightly       Continuous Infusions:    PRN Meds:dextrose in water, dextrose in water, glucagon, glucose, ipratropium-albuteroL, magnesium sulfate in water **AND** Notify Provider **AND** Notify Provider **AND** Obtain lab:   MAGNESIUM, SERUM, ondansetron, oxyCODONE, oxyCODONE    Allergies:   Allergies   Allergen Reactions    Morphine Hives    Atorvastatin      neuropathy    Atorvastatin Calcium  neuropathy    Baclofen      headache  headache    Ibuprofen      GI bleed    Pneumococcal 23-Valent Polysaccharide Vaccine Other (See Comments)     Redness and pain at injection site. Fatigue  Pt stated his arm swelled within 24hrs after injection    Zetia [Ezetimibe] Other (See Comments)     Pt states it effected his brain  Pt stated it effected his sight, I couldn't drive a car at all       OBJECTIVE:    Vital signs in last 24 hours:  Temp:  [36.4 ??C (97.5 ??F)-37 ??C (98.6 ??F)] 36.8 ??C (98.2 ??F)  Heart Rate:  [75-97] 75  SpO2 Pulse:  [75-96] 75  Resp:  [8-27] 16  BP: (105-142)/(58-80) 135/69  MAP (mmHg):  [68-96] 90  FiO2 (%):  [3 %] 3 %  SpO2:  [93 %-98 %] 97 %    Vitals:    03/21/22 0546 03/23/22 0500   Weight: 90.4 kg (199 lb 4.7 oz) 88.9 kg (195 lb 15.8 oz)       Intake/Output last 24 hours:  I/O last 3 completed shifts:  In: 349.1 [P.O.:265; I.V.:6.6; IV Piggyback:77.5]  Out: 3022 [Urine:2415; Chest Tube:607]    Physical Exam:  General: No acute distress. Interactive during exam.  Cardiovascular: normal rate and regular rhythm. No murmurs, rubs or gallops. x1 mediastinal chest tube remains.  Pulmonary: CTA B, Decreased breath sounds in bases. No wheezes, rhonchi, or rubs. Right and left pleural chest tube remains. Negative for airleak.  Abdominal/Gastrointestinal: Soft, non-tender, non-distended, positive bowel sounds throughout.  Musculoskeletal: Extremities warm, dry, well perfused.  negative for lower extremity edema.  Neuro: A+O x 3.  No focal neurologic deficits noted.  Skin: Warm and dry. Surgical incision clean, dry and intact.   Sternum: stable to palpation.  No signs of infection.          Data Review:  Recent Labs   Lab Units 03/23/22  0652   SODIUM mmol/L 141   POTASSIUM mmol/L 4.0   CHLORIDE mmol/L 107   CO2 mmol/L 30.0   BUN mg/dL 15   CREATININE mg/dL 1.61   CALCIUM mg/dL 8.2*   MAGNESIUM mg/dL 1.8   PHOSPHORUS mg/dL 2.1*      Lab Results   Component Value Date    BILITOT 0.6 03/20/2022    BILITOT 0.4 03/01/2022    BILIDIR 0.20 03/20/2022    ALT 12 03/20/2022    ALT 14 03/01/2022    AST 31 03/20/2022    AST 21 03/01/2022    ALKPHOS 41 (L) 03/20/2022    ALKPHOS 61 03/01/2022    PROT 5.8 03/20/2022    PROT 7.7 03/01/2022    ALBUMIN 3.5 03/20/2022    ALBUMIN 4.1 03/01/2022        Recent Labs   Lab Units 03/23/22  0653   HEMOGLOBIN g/dL 8.7*   HEMATOCRIT % 09.6*   PLATELET COUNT (1) 10*9/L 113*     No results in the last day    Invalid input(s): PTPATIENT   Recent Labs   Lab Units 03/23/22  0829 03/22/22  2109 03/22/22  1642 03/22/22  1110   POC GLUCOSE mg/dL 045 [phone]*       Diet:   Active Orders   Diet    Nutrition Therapy Regular/House       Imaging:  CXR 03/23/2022:  Bibasilar subsegmental atelectasis, improved since prior. No new consolidation.  Hospital Course:  11/27 CABG x2 (LIMA to LAD, RSVG to OM), no TEE due to esophageal stricture     ASSESSMENT:     Christopher [name] is a 71 y.o. male w/ angina, CAD (LAD and OM), HLD, T2DM (HgbA1C 7.9), HTN, statin intolerance, TIA.    PLAN:    Cardiovascular: postoperative state from CABG x2   - No TEE done 2/2 esophageal stricture. Normal EF on TTE   - VSS, NSR: continue metop  - continue ASA + Plavix and statin   - x1 mediastinal chest tube remains intact to waterseal     Respiratory:  - on 3L [place] with good O2 saturation   - CXR results as stated above  - right and left pleural drain remains to waterseal for high output    - continue Pulmonary toilet with nebs, ambulation, and incentive spirometer 10x/hour    Neuro:  - no issues, oriented x4  - pain management: scheduled tylenol, gaba, lido patches, prn oxy   - home Zoloft  - continue trazadone and melatonin at bedtime     Renal:  - Cr stable    - diuresis: IV lasix 20mg  once   - continue strict i/o   - continue Flomax   - replace electrolytes prn    FEN/GI:  - diet: regular: tolerating with good PO intake   - continue bowel regimen with Miralax, Colace, movantik   - needs post op BM     Endocrine:  - h/o T2DM with pre-op A1C of 7.0 %   - endocrinology following, appreciate recs:   - NPH 10 units Q12H  - Lispro 9 units TIDAC  - Lispro 1:30 > 140 ACHS  - Hypoglycemia protocol.  - POCT-BG achs.  - Ensure patient is on glucose precautions if patient taking nutrition by mouth    ID:  - afebrile, wbc wnl  - perioperative antibiotics completed  - cultures: none     Heme:  - acute post op blood loss anemia: H/H stable    - monitor daily cbc and transfuse as clinically indicated   - monitor daily chest tube output   - sq heparin tid for dvt prophylaxis     Dispo:  Full code, stepdown status  Barriers to DC: chest tubes, mobility           Evern Core, NP-C  Cardiac Surgery   (P) [phone]

## 2022-03-23 NOTE — Unmapped (Incomplete)
Pt A&Ox4, v/s stable, NSR on tele, and on 3L [place]. No complaints of pain throughout shift. Pt has been drowsy during shift, but easily awoken. Pt was on a trial of void; pt passed trial of void around 4 am. No BM during shift, LBM was PTA. Pt has 3 right chest tubes to -20 suction, for output check flowsheets.  Blood sugar is check per orders; pt on regular diet.     Problem: Adult Inpatient Plan of Care  Goal: Plan of Care Review  Outcome: Progressing  Goal: Patient-Specific Goal (Individualized)  Outcome: Progressing  Goal: Absence of Hospital-Acquired Illness or Injury  Outcome: Progressing  Intervention: Identify and Manage Fall Risk  Recent Flowsheet Documentation  Taken 03/22/2022 2000 by Nicholes Calamity, RN  Safety Interventions:   aspiration precautions   low bed   lighting adjusted for tasks/safety   nonskid shoes/slippers when out of bed  Intervention: Prevent Skin Injury  Recent Flowsheet Documentation  Taken 03/23/2022 0200 by Nicholes Calamity, RN  Positioning for Skin: Supine/Back  Device Skin Pressure Protection:   absorbent pad utilized/changed   adhesive use limited  Skin Protection: adhesive use limited  Taken 03/23/2022 0000 by Nicholes Calamity, RN  Positioning for Skin: Supine/Back  Device Skin Pressure Protection:   absorbent pad utilized/changed   adhesive use limited  Skin Protection: adhesive use limited  Taken 03/22/2022 2200 by Nicholes Calamity, RN  Positioning for Skin: Supine/Back  Device Skin Pressure Protection:   absorbent pad utilized/changed   adhesive use limited  Skin Protection: adhesive use limited  Taken 03/22/2022 2000 by Nicholes Calamity, RN  Positioning for Skin: Supine/Back  Device Skin Pressure Protection:   absorbent pad utilized/changed   adhesive use limited  Skin Protection: adhesive use limited  Intervention: Prevent and Manage VTE (Venous Thromboembolism) Risk  Recent Flowsheet Documentation  Taken 03/23/2022 0200 by Nicholes Calamity, RN  Anti-Embolism Device Type: SCD, Knee  Anti-Embolism Intervention: Refused  Anti-Embolism Device Location: BLE  Taken 03/23/2022 0000 by Nicholes Calamity, RN  VTE Prevention/Management: anticoagulant therapy  Anti-Embolism Device Type: SCD, Knee  Anti-Embolism Intervention: Refused  Anti-Embolism Device Location: BLE  Taken 03/22/2022 2200 by Nicholes Calamity, RN  Anti-Embolism Device Type: SCD, Knee  Anti-Embolism Intervention: Refused  Anti-Embolism Device Location: BLE  Taken 03/22/2022 2000 by Nicholes Calamity, RN  VTE Prevention/Management: anticoagulant therapy  Anti-Embolism Device Type: SCD, Knee  Anti-Embolism Intervention: Refused  Anti-Embolism Device Location: BLE  Intervention: Prevent Infection  Recent Flowsheet Documentation  Taken 03/22/2022 2000 by Nicholes Calamity, RN  Infection Prevention:   hand hygiene promoted   personal protective equipment utilized  Goal: Optimal Comfort and Wellbeing  Outcome: Progressing  Goal: Readiness for Transition of Care  Outcome: Progressing  Goal: Rounds/Family Conference  Outcome: Progressing     Problem: Wound  Goal: Optimal Coping  Outcome: Progressing  Goal: Optimal Functional Ability  Outcome: Progressing  Goal: Absence of Infection Signs and Symptoms  Outcome: Progressing  Intervention: Prevent or Manage Infection  Recent Flowsheet Documentation  Taken 03/22/2022 2000 by Nicholes Calamity, RN  Infection Management: aseptic technique maintained  Goal: Improved Oral Intake  Outcome: Progressing  Goal: Optimal Pain Control and Function  Outcome: Progressing  Goal: Skin Health and Integrity  Outcome: Progressing  Intervention: Optimize Skin Protection  Recent Flowsheet Documentation  Taken 03/23/2022 0200 by Nicholes Calamity, RN  Pressure Reduction Techniques: frequent weight shift encouraged  Pressure Reduction Devices: pressure-redistributing mattress utilized  Skin Protection: adhesive use limited  Taken 03/23/2022 0000 by Nicholes Calamity, RN  Pressure Reduction Techniques: frequent weight shift encouraged  Pressure Reduction Devices: pressure-redistributing mattress utilized  Skin Protection: adhesive use limited  Taken 03/22/2022 2200 by Nicholes Calamity, RN  Pressure Reduction Techniques: frequent weight shift encouraged  Pressure Reduction Devices: pressure-redistributing mattress utilized  Skin Protection: adhesive use limited  Taken 03/22/2022 2000 by Nicholes Calamity, RN  Pressure Reduction Techniques: frequent weight shift encouraged  Pressure Reduction Devices: pressure-redistributing mattress utilized  Skin Protection: adhesive use limited  Goal: Optimal Wound Healing  Outcome: Progressing     Problem: Skin Injury Risk Increased  Goal: Skin Health and Integrity  Outcome: Progressing  Intervention: Optimize Skin Protection  Recent Flowsheet Documentation  Taken 03/23/2022 0200 by Nicholes Calamity, RN  Pressure Reduction Techniques: frequent weight shift encouraged  Pressure Reduction Devices: pressure-redistributing mattress utilized  Skin Protection: adhesive use limited  Taken 03/23/2022 0000 by Nicholes Calamity, RN  Pressure Reduction Techniques: frequent weight shift encouraged  Pressure Reduction Devices: pressure-redistributing mattress utilized  Skin Protection: adhesive use limited  Taken 03/22/2022 2200 by Nicholes Calamity, RN  Pressure Reduction Techniques: frequent weight shift encouraged  Pressure Reduction Devices: pressure-redistributing mattress utilized  Skin Protection: adhesive use limited  Taken 03/22/2022 2000 by Nicholes Calamity, RN  Pressure Reduction Techniques: frequent weight shift encouraged  Pressure Reduction Devices: pressure-redistributing mattress utilized  Skin Protection: adhesive use limited     Problem: Fall Injury Risk  Goal: Absence of Fall and Fall-Related Injury  Outcome: Progressing  Intervention: Promote Scientist, clinical (histocompatibility and immunogenetics) Documentation  Taken 03/22/2022 2000 by Nicholes Calamity, RN  Safety Interventions:   aspiration precautions   low bed   lighting adjusted for tasks/safety   nonskid shoes/slippers when out of bed     Problem: Self-Care Deficit  Goal: Improved Ability to Complete Activities of Daily Living  Outcome: Progressing

## 2022-03-23 NOTE — Unmapped (Signed)
Patient is AOX4, follows commands, and afebrile. Central line and foley removed per order. Patient ambulated twice with PT and OT. Transferred patient to step down at 1615. Family updated and aware of transfer.  Problem: Adult Inpatient Plan of Care  Goal: Plan of Care Review  Outcome: Ongoing - Unchanged  Goal: Patient-Specific Goal (Individualized)  Outcome: Ongoing - Unchanged  Goal: Absence of Hospital-Acquired Illness or Injury  Outcome: Ongoing - Unchanged  Intervention: Identify and Manage Fall Risk  Recent Flowsheet Documentation  Taken 03/22/2022 0800 by Denice Bors, RN  Safety Interventions:   aspiration precautions   bleeding precautions   fall reduction program maintained   infection management   lighting adjusted for tasks/safety   low bed   nonskid shoes/slippers when out of bed   room near unit station  Intervention: Prevent Skin Injury  Recent Flowsheet Documentation  Taken 03/22/2022 1400 by Denice Bors, RN  Positioning for Skin: Right  Taken 03/22/2022 1200 by Denice Bors, RN  Positioning for Skin: Sitting in Chair  Taken 03/22/2022 1000 by Denice Bors, RN  Positioning for Skin: Sitting in Chair  Taken 03/22/2022 0800 by Denice Bors, RN  Positioning for Skin: Sitting in Chair  Device Skin Pressure Protection:   absorbent pad utilized/changed   adhesive use limited   pressure points protected   skin-to-device areas padded   skin-to-skin areas padded  Skin Protection:   adhesive use limited   incontinence pads utilized   silicone foam dressing in place   skin-to-device areas padded   skin-to-skin areas padded   transparent dressing maintained   tubing/devices free from skin contact  Intervention: Prevent and Manage VTE (Venous Thromboembolism) Risk  Recent Flowsheet Documentation  Taken 03/22/2022 1400 by Denice Bors, RN  Anti-Embolism Intervention: Off  Taken 03/22/2022 1200 by Denice Bors, RN  Anti-Embolism Intervention: Off  Taken 03/22/2022 1000 by Denice Bors, RN  Anti-Embolism Intervention: Off  Taken 03/22/2022 0800 by Denice Bors, RN  Anti-Embolism Intervention: Off  Intervention: Prevent Infection  Recent Flowsheet Documentation  Taken 03/22/2022 0800 by Denice Bors, RN  Infection Prevention:   cohorting utilized   environmental surveillance performed   equipment surfaces disinfected   hand hygiene promoted   personal protective equipment utilized   rest/sleep promoted   single patient room provided   visitors restricted/screened  Goal: Optimal Comfort and Wellbeing  Outcome: Ongoing - Unchanged  Goal: Readiness for Transition of Care  Outcome: Ongoing - Unchanged  Goal: Rounds/Family Conference  Outcome: Ongoing - Unchanged     Problem: Wound  Goal: Optimal Coping  Outcome: Ongoing - Unchanged  Goal: Optimal Functional Ability  Outcome: Ongoing - Unchanged  Intervention: Optimize Functional Ability  Recent Flowsheet Documentation  Taken 03/22/2022 1400 by Denice Bors, RN  Activity Management: back to bed  Taken 03/22/2022 1200 by Denice Bors, RN  Activity Management: up in chair  Taken 03/22/2022 1000 by Denice Bors, RN  Activity Management: up in chair  Taken 03/22/2022 0800 by Denice Bors, RN  Activity Management: up in chair  Goal: Absence of Infection Signs and Symptoms  Outcome: Ongoing - Unchanged  Intervention: Prevent or Manage Infection  Recent Flowsheet Documentation  Taken 03/22/2022 0800 by Denice Bors, RN  Infection Management: aseptic technique maintained  Goal: Improved Oral Intake  Outcome: Ongoing - Unchanged  Goal: Optimal Pain Control and Function  Outcome: Ongoing - Unchanged  Goal: Skin Health and Integrity  Outcome: Ongoing - Unchanged  Intervention: Optimize Skin Protection  Recent Flowsheet Documentation  Taken 03/22/2022 1400 by Denice Bors, RN  Activity Management: back to bed  Head of Bed [facility]) Positioning: HOB at 30-45 degrees  Taken 03/22/2022 1200 by Denice Bors, RN  Activity Management: up in chair  Head of Bed [facility]) Positioning: HOB at 30-45 degrees  Taken 03/22/2022 1000 by Denice Bors, RN  Activity Management: up in chair  Head of Bed [facility]) Positioning: HOB at 45 degrees  Taken 03/22/2022 0800 by Denice Bors, RN  Activity Management: up in chair  Pressure Reduction Techniques: frequent weight shift encouraged  Head of Bed (HOB) Positioning: HOB at 30-45 degrees  Pressure Reduction Devices: pressure-redistributing mattress utilized  Skin Protection:   adhesive use limited   incontinence pads utilized   silicone foam dressing in place   skin-to-device areas padded   skin-to-skin areas padded   transparent dressing maintained   tubing/devices free from skin contact  Goal: Optimal Wound Healing  Outcome: Ongoing - Unchanged     Problem: Skin Injury Risk Increased  Goal: Skin Health and Integrity  Outcome: Ongoing - Unchanged  Intervention: Optimize Skin Protection  Recent Flowsheet Documentation  Taken 03/22/2022 1400 by Denice Bors, RN  Activity Management: back to bed  Head of Bed [facility]) Positioning: HOB at 30-45 degrees  Taken 03/22/2022 1200 by Denice Bors, RN  Activity Management: up in chair  Head of Bed [facility]) Positioning: HOB at 30-45 degrees  Taken 03/22/2022 1000 by Denice Bors, RN  Activity Management: up in chair  Head of Bed [facility]) Positioning: HOB at 45 degrees  Taken 03/22/2022 0800 by Denice Bors, RN  Activity Management: up in chair  Pressure Reduction Techniques: frequent weight shift encouraged  Head of Bed (HOB) Positioning: HOB at 30-45 degrees  Pressure Reduction Devices: pressure-redistributing mattress utilized  Skin Protection:   adhesive use limited   incontinence pads utilized   silicone foam dressing in place   skin-to-device areas padded   skin-to-skin areas padded   transparent dressing maintained   tubing/devices free from skin contact     Problem: Fall Injury Risk  Goal: Absence of Fall and Fall-Related Injury  Outcome: Ongoing - Unchanged  Intervention: Promote Scientist, clinical (histocompatibility and immunogenetics) Documentation  Taken 03/22/2022 0800 by Denice Bors, RN  Safety Interventions:   aspiration precautions   bleeding precautions   fall reduction program maintained   infection management   lighting adjusted for tasks/safety   low bed   nonskid shoes/slippers when out of bed   room near unit station     Problem: Self-Care Deficit  Goal: Improved Ability to Complete Activities of Daily Living  Outcome: Ongoing - Unchanged     Problem: Non-Violent Restraints  Goal: Patient will remain free of restraint events  Outcome: Resolved  Goal: Patient will remain free of physical injury  Outcome: Resolved     Problem: Mechanical Ventilation Invasive  Goal: Effective Communication  Outcome: Resolved  Goal: Optimal Device Function  Outcome: Resolved  Intervention: Optimize Device Care and Function  Recent Flowsheet Documentation  Taken 03/22/2022 1200 by Denice Bors, RN  Oral Care:   oral rinse provided   suction provided   mouth swabbed  Taken 03/22/2022 0800 by Denice Bors, RN  Oral Care:   oral rinse provided   suction provided  Goal: Mechanical Ventilation Liberation  Outcome: Resolved  Goal: Optimal Nutrition Delivery  Outcome: Resolved  Goal: Absence of Device-Related Skin and Tissue Injury  Outcome: Resolved  Intervention: Maintain Skin and Tissue Health  Recent Flowsheet Documentation  Taken 03/22/2022 0800 by Denice Bors, RN  Device Skin Pressure Protection:   absorbent pad utilized/changed   adhesive use limited   pressure points protected   skin-to-device areas padded   skin-to-skin areas padded  Goal: Absence of Ventilator-Induced Lung Injury  Outcome: Resolved  Intervention: Prevent Ventilator-Associated Pneumonia  Recent Flowsheet Documentation  Taken 03/22/2022 1400 by Denice Bors, RN  Head of Bed [facility]) Positioning: HOB at 30-45 degrees  Taken 03/22/2022 1200 by Denice Bors, RN  Head of Bed [facility]) Positioning: HOB at 30-45 degrees  Oral Care:   oral rinse provided   suction provided   mouth swabbed  Taken 03/22/2022 1000 by Denice Bors, RN  Head of Bed [facility]) Positioning: HOB at 45 degrees  Taken 03/22/2022 0800 by Denice Bors, RN  Head of Bed [facility]) Positioning: HOB at 30-45 degrees  Oral Care:   oral rinse provided   suction provided

## 2022-03-24 LAB — CBC
HEMATOCRIT: 24.5 % — ABNORMAL LOW (ref 39.0–48.0)
HEMOGLOBIN: 8.3 g/dL — ABNORMAL LOW (ref 12.9–16.5)
MEAN CORPUSCULAR HEMOGLOBIN CONC: 33.9 g/dL (ref 32.0–36.0)
MEAN CORPUSCULAR HEMOGLOBIN: 31.4 pg (ref 25.9–32.4)
MEAN CORPUSCULAR VOLUME: 92.8 fL (ref 77.6–95.7)
MEAN PLATELET VOLUME: 7.7 fL (ref 6.8–10.7)
PLATELET COUNT: 152 10*9/L (ref 150–450)
RED BLOOD CELL COUNT: 2.65 10*12/L — ABNORMAL LOW (ref 4.26–5.60)
RED CELL DISTRIBUTION WIDTH: 13.4 % (ref 12.2–15.2)
WBC ADJUSTED: 5.5 10*9/L (ref 3.6–11.2)

## 2022-03-24 LAB — BASIC METABOLIC PANEL
ANION GAP: 4 mmol/L — ABNORMAL LOW (ref 5–14)
BLOOD UREA NITROGEN: 18 mg/dL (ref 9–23)
BUN / CREAT RATIO: 20
CALCIUM: 8.4 mg/dL — ABNORMAL LOW (ref 8.7–10.4)
CHLORIDE: 106 mmol/L (ref 98–107)
CO2: 32 mmol/L — ABNORMAL HIGH (ref 20.0–31.0)
CREATININE: 0.89 mg/dL
EGFR CKD-EPI (2021) MALE: 90 mL/min/{1.73_m2} (ref >=60)
GLUCOSE RANDOM: 112 mg/dL (ref 70–179)
POTASSIUM: 3.9 mmol/L (ref 3.4–4.8)
SODIUM: 142 mmol/L (ref 135–145)

## 2022-03-24 LAB — PHOSPHORUS: PHOSPHORUS: 2.6 mg/dL (ref 2.4–5.1)

## 2022-03-24 LAB — MAGNESIUM: MAGNESIUM: 1.7 mg/dL (ref 1.6–2.6)

## 2022-03-24 MED ORDER — OZEMPIC 0.25 MG OR 0.5 MG (2 MG/3 ML) SUBCUTANEOUS PEN INJECTOR
SUBCUTANEOUS | 0 refills | 56 days
Start: 2022-03-24 — End: 2022-05-19

## 2022-03-24 MED ADMIN — naloxegoL (MOVANTIK) tablet 25 mg: 25 mg | ORAL | @ 14:00:00 | Stop: 2022-03-26

## 2022-03-24 MED ADMIN — bisacodyL (DULCOLAX) EC tablet 10 mg: 10 mg | ORAL | @ 15:00:00 | Stop: 2022-03-24

## 2022-03-24 MED ADMIN — docusate sodium (COLACE) capsule 100 mg: 100 mg | ORAL | @ 14:00:00

## 2022-03-24 MED ADMIN — mupirocin (BACTROBAN) 2 % ointment: NASAL | @ 01:00:00 | Stop: 2022-03-25

## 2022-03-24 MED ADMIN — traZODone (DESYREL) tablet 150 mg: 150 mg | ORAL | @ 01:00:00

## 2022-03-24 MED ADMIN — insulin NPH (HumuLIN,NovoLIN) injection 7 Units: 7 [IU] | SUBCUTANEOUS | @ 15:00:00

## 2022-03-24 MED ADMIN — potassium chloride ER tablet 10 mEq: 10 meq | ORAL | @ 14:00:00

## 2022-03-24 MED ADMIN — furosemide (LASIX) injection 20 mg: 20 mg | INTRAVENOUS | @ 15:00:00 | Stop: 2022-03-24

## 2022-03-24 MED ADMIN — docusate sodium (COLACE) capsule 100 mg: 100 mg | ORAL | @ 01:00:00

## 2022-03-24 MED ADMIN — insulin lispro (HumaLOG) injection 0-9 Units: 0-9 [IU] | SUBCUTANEOUS | @ 17:00:00

## 2022-03-24 MED ADMIN — magnesium oxide (MAG-OX) tablet 400 mg: 400 mg | ORAL | @ 14:00:00

## 2022-03-24 MED ADMIN — colchicine (COLCRYS) tablet 0.6 mg: .6 mg | ORAL | @ 01:00:00 | Stop: 2022-03-25

## 2022-03-24 MED ADMIN — acetaminophen (TYLENOL) tablet 1,000 mg: 1000 mg | ORAL | @ 11:00:00

## 2022-03-24 MED ADMIN — melatonin tablet 6 mg: 6 mg | ORAL | @ 01:00:00

## 2022-03-24 MED ADMIN — insulin lispro (HumaLOG) injection 6 Units: 6 [IU] | SUBCUTANEOUS | @ 15:00:00

## 2022-03-24 MED ADMIN — pravastatin (PRAVACHOL) tablet 40 mg: 40 mg | ORAL | @ 14:00:00

## 2022-03-24 MED ADMIN — metoPROLOL tartrate (LOPRESSOR) tablet 12.5 mg: 12.5 mg | ORAL | @ 11:00:00

## 2022-03-24 MED ADMIN — heparin (porcine) 5,000 unit/mL injection 5,000 Units: 5000 [IU] | SUBCUTANEOUS | @ 03:00:00

## 2022-03-24 MED ADMIN — acetaminophen (TYLENOL) tablet 1,000 mg: 1000 mg | ORAL | @ 17:00:00

## 2022-03-24 MED ADMIN — insulin NPH (HumuLIN,NovoLIN) injection 9 Units: 9 [IU] | SUBCUTANEOUS | @ 01:00:00

## 2022-03-24 MED ADMIN — aspirin chewable tablet 81 mg: 81 mg | ORAL | @ 14:00:00

## 2022-03-24 MED ADMIN — acetaminophen (TYLENOL) tablet 1,000 mg: 1000 mg | ORAL | @ 01:00:00

## 2022-03-24 MED ADMIN — heparin (porcine) 5,000 unit/mL injection 5,000 Units: 5000 [IU] | SUBCUTANEOUS | @ 19:00:00

## 2022-03-24 MED ADMIN — metoPROLOL tartrate (LOPRESSOR) tablet 12.5 mg: 12.5 mg | ORAL | @ 03:00:00

## 2022-03-24 MED ADMIN — magnesium oxide (MAG-OX) tablet 400 mg: 400 mg | ORAL | @ 01:00:00

## 2022-03-24 MED ADMIN — heparin (porcine) 5,000 unit/mL injection 5,000 Units: 5000 [IU] | SUBCUTANEOUS | @ 11:00:00

## 2022-03-24 MED ADMIN — tamsulosin (FLOMAX) 24 hr capsule 0.4 mg: .4 mg | ORAL | @ 01:00:00

## 2022-03-24 MED ADMIN — gabapentin (NEURONTIN) capsule 300 mg: 300 mg | ORAL | @ 14:00:00

## 2022-03-24 MED ADMIN — clopidogreL (PLAVIX) tablet 75 mg: 75 mg | ORAL | @ 14:00:00

## 2022-03-24 MED ADMIN — gabapentin (NEURONTIN) capsule 300 mg: 300 mg | ORAL | @ 01:00:00

## 2022-03-24 MED ADMIN — polyethylene glycol (MIRALAX) packet 17 g: 17 g | ORAL | @ 14:00:00

## 2022-03-24 MED ADMIN — insulin lispro (HumaLOG) injection 6 Units: 6 [IU] | SUBCUTANEOUS | @ 19:00:00

## 2022-03-24 MED ADMIN — polyethylene glycol (MIRALAX) packet 17 g: 17 g | ORAL | @ 22:00:00

## 2022-03-24 MED ADMIN — colchicine (COLCRYS) tablet 0.6 mg: .6 mg | ORAL | @ 14:00:00 | Stop: 2022-03-24

## 2022-03-24 MED ADMIN — metoPROLOL tartrate (LOPRESSOR) tablet 12.5 mg: 12.5 mg | ORAL | @ 19:00:00

## 2022-03-24 MED ADMIN — sertraline (ZOLOFT) tablet 100 mg: 100 mg | ORAL | @ 14:00:00

## 2022-03-24 MED ADMIN — mupirocin (BACTROBAN) 2 % ointment: NASAL | @ 14:00:00 | Stop: 2022-03-24

## 2022-03-24 NOTE — Unmapped (Signed)
CARDIAC SURGERY PROGRESS NOTE    SUBJECTIVE:  There were no acute events overnight. Patient states that his pain is well controlled with current regimen. He is doing well overall. Patient has not yet had a post-op BM. He denies any abdominal pain or nausea.     Three chest tubes remaining. Plan to remove left and right pleural tubes today.       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)    Opioid dependence with current use (CMS-HCC)    TIA (transient ischemic attack)    Accelerating angina (CMS-HCC)      MEDICATIONS:  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    Corneal abrasion 1975    steel in right eye with abrasion and removal    Cubital tunnel syndrome on left 01/16/2019    Depression 2016    Diabetes mellitus (CMS-HCC)     GERD (gastroesophageal reflux disease) 1982    Hypertension     Meningitis 12/25/2015    Mixed hyperlipidemia     Myocardial infarction (CMS-HCC) 1986    Obesity 1986    first back surgery    Peptic ulceration        Scheduled Meds:   acetaminophen  1,000 mg Oral QID    aspirin  81 mg Oral Daily    clopidogreL  75 mg Oral Daily    colchicine  0.6 mg Oral BID    docusate sodium  100 mg Oral BID    gabapentin  300 mg Oral BID    heparin (porcine) for subcutaneous use  5,000 Units Subcutaneous Q8H SCH    insulin lispro 0-9 Units Subcutaneous ACHS    insulin lispro  6 Units Subcutaneous 3xd Meals    insulin NPH  7 Units Subcutaneous Q12H SCH    lidocaine  3 patch Transdermal Daily    magnesium oxide  400 mg Oral BID    melatonin  6 mg Oral QPM    metoPROLOL tartrate  12.5 mg Oral Q8H SCH    mupirocin   Each Nare BID    naloxegoL  25 mg Oral Daily    polyethylene glycol  17 g Oral 3xd Meals    potassium chloride  10 mEq Oral Daily    pravastatin  40 mg Oral Daily    sertraline  100 mg Oral Daily    tamsulosin  0.4 mg Oral Nightly    traZODone  150 mg Oral Nightly       Continuous Infusions:    PRN Meds:dextrose in water, dextrose in water, glucagon, glucose, ipratropium-albuteroL, ondansetron, oxyCODONE, oxyCODONE    Allergies:   Allergies   Allergen Reactions    Morphine Hives  Atorvastatin      neuropathy    Atorvastatin Calcium      neuropathy    Baclofen      headache  headache    Ibuprofen      GI bleed    Pneumococcal 23-Valent Polysaccharide Vaccine Other (See Comments)     Redness and pain at injection site. Fatigue  Pt stated his arm swelled within 24hrs after injection    Zetia [Ezetimibe] Other (See Comments)     Pt states it effected his brain  Pt stated it effected his sight, I couldn't drive a car at all       OBJECTIVE:    Vital signs in last 24 hours:  Temp:  [36.5 ??C (97.7 ??F)-37 ??C (98.6 ??F)] 36.5 ??C (97.7 ??F)  Heart Rate:  [81-94] 94  SpO2 Pulse:  [81-92] 91  Resp:  [10-26] 18  BP: (126-154)/(64-83) 134/74  MAP (mmHg):  [86-105] 96  SpO2:  [94 %-96 %] 94 %    Vitals:    03/23/22 0500 03/23/22 1958   Weight: 88.9 kg (195 lb 15.8 oz) 88.9 kg (195 lb 15.8 oz)       Intake/Output last 24 hours:  I/O last 3 completed shifts:  In: 505 [P.O.:505]  Out: 2725 [Urine:2425; Chest Tube:300]    Physical Exam:  General: No acute distress. Interactive during exam.  Cardiovascular: normal rate and regular rhythm. No murmurs, rubs or gallops. x1 mediastinal chest tube remains.  Pulmonary: CTA B, Decreased breath sounds in bases. No wheezes, rhonchi, or rubs. Right and left pleural chest tube remains. Negative for airleak.  Abdominal/Gastrointestinal: Soft, non-tender, non-distended, positive bowel sounds throughout.  Musculoskeletal: Extremities warm, dry, well perfused.  negative for lower extremity edema.  Neuro: A+O x 3.  No focal neurologic deficits noted.  Skin: Warm and dry. Surgical incision clean, dry and intact.   Sternum: stable to palpation.  No signs of infection.          Data Review:  Recent Labs   Lab Units 03/24/22  0600   SODIUM mmol/L 142   POTASSIUM mmol/L 3.9   CHLORIDE mmol/L 106   CO2 mmol/L 32.0*   BUN mg/dL 18   CREATININE mg/dL 1.61   CALCIUM mg/dL 8.4*   MAGNESIUM mg/dL 1.7   PHOSPHORUS mg/dL 2.6      Lab Results   Component Value Date    BILITOT 0.6 03/20/2022    BILITOT 0.4 03/01/2022    BILIDIR 0.20 03/20/2022    ALT 12 03/20/2022    ALT 14 03/01/2022    AST 31 03/20/2022    AST 21 03/01/2022    ALKPHOS 41 (L) 03/20/2022    ALKPHOS 61 03/01/2022    PROT 5.8 03/20/2022    PROT 7.7 03/01/2022    ALBUMIN 3.5 03/20/2022    ALBUMIN 4.1 03/01/2022        Recent Labs   Lab Units 03/24/22  0600   HEMOGLOBIN g/dL 8.3*   HEMATOCRIT % 09.6*   PLATELET COUNT (1) 10*9/L 152     No results in the last day    Invalid input(s): PTPATIENT   Recent Labs   Lab Units 03/24/22  1140 03/24/22  0839 03/23/22  2039 03/23/22  1808   POC GLUCOSE mg/dL 045* [phone]       Diet:   Active Orders   Diet    Nutrition Therapy Regular/House; Low Sugar       Imaging:  CXR 03/23/2022:  Bibasilar subsegmental  atelectasis, improved since prior. No new consolidation.     Hospital Course:  11/27 CABG x2 (LIMA to LAD, RSVG to OM), no TEE due to esophageal stricture     ASSESSMENT:     Christopher [name] is a 71 y.o. male w/ angina, CAD (LAD and OM), HLD, T2DM (HgbA1C 7.9), HTN, statin intolerance, TIA.    PLAN:    Cardiovascular: postoperative state from CABG x2   - No TEE done 2/2 esophageal stricture. Normal EF on TTE   - VSS, NSR: continue metop  - continue ASA + Plavix and statin   - x1 mediastinal chest tube remains intact to waterseal     Respiratory:  - on 2L [place] with good O2 saturation - wean as tolerated  - CXR results as stated above  - Right and left pleural drain removed today   - post-pull cxr pending  - continue Pulmonary toilet with nebs, ambulation, and incentive spirometer 10x/hour    Neuro:  - no issues, oriented x4  - pain management: scheduled tylenol, gaba, lido patches, prn oxy   - home Zoloft  - continue trazadone and melatonin at bedtime     Renal:  - Cr stable    - diuresis: IV lasix 20mg  once   - continue strict i/o   - continue Flomax   - replace electrolytes prn    FEN/GI:  - diet: regular: tolerating with good PO intake   - continue bowel regimen with Miralax, Colace, movantik   - needs post op BM - Dulcolax ordered today    Endocrine:  - h/o T2DM with pre-op A1C of 7.0 %   - endocrinology following, appreciate recs:   - NPH 10 units Q12H  - Lispro 9 units TIDAC  - Lispro 1:30 > 140 ACHS  - Hypoglycemia protocol.  - POCT-BG achs.  - Ensure patient is on glucose precautions if patient taking nutrition by mouth    ID:  - afebrile, wbc wnl  - perioperative antibiotics completed  - cultures: none     Heme:  - acute post op blood loss anemia: H/H stable    - monitor daily cbc and transfuse as clinically indicated   - monitor daily chest tube output   - sq heparin tid for dvt prophylaxis     Dispo:  Full code, stepdown status  Barriers to DC: chest tubes, mobility       Tawanna Cooler, PA-C  Cardiac Surgery   (p) [phone]

## 2022-03-24 NOTE — Unmapped (Signed)
VSS. Alert and oriented. Denies pain. CT to [facility] and draining serosanguineous fluid. NSR. 3L [place]. Ambulated in hallway. Up in chair in the afternoon. Adequate urine output. No BM, taking bowel reg.   Problem: Adult Inpatient Plan of Care  Goal: Plan of Care Review  Outcome: Ongoing - Unchanged  Goal: Patient-Specific Goal (Individualized)  Outcome: Ongoing - Unchanged  Goal: Absence of Hospital-Acquired Illness or Injury  Outcome: Ongoing - Unchanged  Intervention: Identify and Manage Fall Risk  Recent Flowsheet Documentation  Taken 03/23/2022 0800 by Cristy Hilts, RN  Safety Interventions:   aspiration precautions   low bed   lighting adjusted for tasks/safety   nonskid shoes/slippers when out of bed   bleeding precautions  Intervention: Prevent Skin Injury  Recent Flowsheet Documentation  Taken 03/23/2022 1400 by Cristy Hilts, RN  Positioning for Skin: Sitting in Chair  Taken 03/23/2022 1200 by Cristy Hilts, RN  Positioning for Skin: Sitting in Chair  Taken 03/23/2022 1000 by Cristy Hilts, RN  Positioning for Skin: Supine/Back  Taken 03/23/2022 0800 by Cristy Hilts, RN  Positioning for Skin: Supine/Back  Device Skin Pressure Protection:   absorbent pad utilized/changed   adhesive use limited  Skin Protection: adhesive use limited  Intervention: Prevent and Manage VTE (Venous Thromboembolism) Risk  Recent Flowsheet Documentation  Taken 03/23/2022 1800 by Cristy Hilts, RN  Anti-Embolism Device Type: SCD, Knee  Anti-Embolism Intervention: Refused  Anti-Embolism Device Location: BLE  Taken 03/23/2022 1600 by Cristy Hilts, RN  Anti-Embolism Device Type: SCD, Knee  Anti-Embolism Intervention: Refused  Anti-Embolism Device Location: BLE  Taken 03/23/2022 1400 by Cristy Hilts, RN  Anti-Embolism Device Type: SCD, Knee  Anti-Embolism Intervention: Refused  Anti-Embolism Device Location: BLE  Taken 03/23/2022 1200 by Cristy Hilts, RN  Anti-Embolism Device Type: SCD, Knee  Anti-Embolism Intervention: Refused  Anti-Embolism Device Location: BLE  Taken 03/23/2022 1000 by Cristy Hilts, RN  Anti-Embolism Device Type: SCD, Knee  Anti-Embolism Intervention: Refused  Anti-Embolism Device Location: BLE  Taken 03/23/2022 0800 by Cristy Hilts, RN  Anti-Embolism Device Type: SCD, Knee  Anti-Embolism Intervention: Refused  Anti-Embolism Device Location: BLE  Intervention: Prevent Infection  Recent Flowsheet Documentation  Taken 03/23/2022 0800 by Cristy Hilts, RN  Infection Prevention: equipment surfaces disinfected  Goal: Optimal Comfort and Wellbeing  Outcome: Ongoing - Unchanged  Goal: Readiness for Transition of Care  Outcome: Ongoing - Unchanged  Goal: Rounds/Family Conference  Outcome: Ongoing - Unchanged     Problem: Wound  Goal: Optimal Coping  Outcome: Ongoing - Unchanged  Goal: Optimal Functional Ability  Outcome: Ongoing - Unchanged  Intervention: Optimize Functional Ability  Recent Flowsheet Documentation  Taken 03/23/2022 1400 by Cristy Hilts, RN  Activity Management:   ambulated outside room   ambulated in room  Taken 03/23/2022 1200 by Cristy Hilts, RN  Activity Management: ambulated in room  Goal: Absence of Infection Signs and Symptoms  Outcome: Ongoing - Unchanged  Intervention: Prevent or Manage Infection  Recent Flowsheet Documentation  Taken 03/23/2022 0800 by Cristy Hilts, RN  Infection Management: aseptic technique maintained  Goal: Improved Oral Intake  Outcome: Ongoing - Unchanged  Goal: Optimal Pain Control and Function  Outcome: Ongoing - Unchanged  Goal: Skin Health and Integrity  Outcome: Ongoing - Unchanged  Intervention: Optimize Skin Protection  Recent Flowsheet Documentation  Taken 03/23/2022 1400 by Cristy Hilts, RN  Activity Management:   ambulated outside room   ambulated in room  Pressure Reduction Techniques:   frequent weight shift encouraged   weight shift assistance provided  Pressure Reduction Devices: positioning supports utilized  Taken 03/23/2022 1200 by Cristy Hilts, RN  Activity Management: ambulated in room  Pressure Reduction Techniques:   frequent weight shift encouraged   weight shift assistance provided  Pressure Reduction Devices: positioning supports utilized  Taken 03/23/2022 0800 by Cristy Hilts, RN  Pressure Reduction Techniques: frequent weight shift encouraged  Head of Bed (HOB) Positioning: HOB at 30 degrees  Pressure Reduction Devices: pressure-redistributing mattress utilized  Skin Protection: adhesive use limited  Goal: Optimal Wound Healing  Outcome: Ongoing - Unchanged     Problem: Skin Injury Risk Increased  Goal: Skin Health and Integrity  Outcome: Ongoing - Unchanged  Intervention: Optimize Skin Protection  Recent Flowsheet Documentation  Taken 03/23/2022 1400 by Cristy Hilts, RN  Activity Management:   ambulated outside room   ambulated in room  Pressure Reduction Techniques:   frequent weight shift encouraged   weight shift assistance provided  Pressure Reduction Devices: positioning supports utilized  Taken 03/23/2022 1200 by Cristy Hilts, RN  Activity Management: ambulated in room  Pressure Reduction Techniques:   frequent weight shift encouraged   weight shift assistance provided  Pressure Reduction Devices: positioning supports utilized  Taken 03/23/2022 0800 by Cristy Hilts, RN  Pressure Reduction Techniques: frequent weight shift encouraged  Head of Bed (HOB) Positioning: HOB at 30 degrees  Pressure Reduction Devices: pressure-redistributing mattress utilized  Skin Protection: adhesive use limited     Problem: Fall Injury Risk  Goal: Absence of Fall and Fall-Related Injury  Outcome: Ongoing - Unchanged  Intervention: Promote Scientist, clinical (histocompatibility and immunogenetics) Documentation  Taken 03/23/2022 0800 by Cristy Hilts, RN  Safety Interventions:   aspiration precautions   low bed   lighting adjusted for tasks/safety   nonskid shoes/slippers when out of bed   bleeding precautions     Problem: Self-Care Deficit  Goal: Improved Ability to Complete Activities of Daily Living  Outcome: Ongoing - Unchanged

## 2022-03-24 NOTE — Unmapped (Signed)
Endocrine Team Diabetes Follow Up Consult Note     Consult information:  Requesting Attending Physician : Christopher Marker, MD  Service Requesting Consult : Surg Cardiac [facility])  Primary Care Provider: Amada Kingfisher, MD  Impression:  Christopher [name] is a 71 y.o. male admitted for 2v CABG (LIMA to LAD, RSVG to OM), now s/p 11/27. We have been consulted at the request of Christopher Marker, MD to evaluate Christopher [name] for hyperglycemia.     Medical Decision Making:  Diagnoses:  1.Type 2 Diabetes. Uncontrolled With severe hyperglycemia last 24 hours.   2. Nutrition: Complicating glycemic control. Increasing risk for both hypoglycemia and hyperglycemia.  3. Coronary Artery Disease. Complicating glycemic control and increasing risk for hyperglycemia and hypoglycemia.  4. Obesity. Complicating glycemic control and increasing risk for hyperglycemia.  5. Chronic Kidney Disease. Complicating glycemic control and increasing risk for hypoglycemia.  6. Catecholamine infusion. Complicating glycemic control and increasing risk for hyperglycemia.      Studies reviewed 03/24/22:  Labs: POCT-BG and HbA1C  Interpretation: Generally well-controlled BGs however isolated severe, ranging 112-276 mg/dL. A1C 7.9  (12/22/21) indicating fair outpatient glycemic management.  Notes reviewed: Primary team and nursing notes      Overall impression based on above reviews and history:  Pt with T2DM s/p 2vCABG 11/27. He did well with past revisions. Will again further decrease regimen overall, this time by 20%. Had poor PO intake, but is trying to have better intake, 50% of brunch and 25% of dinner yesterday. States his appetite may be coming back. This morning he had some pancakes with syrup, ginger ale (not sugar free/diet) at bedside and pt endorsing that he hasn't been doing sugar free or diet drinks. We discussed that his breakfast and sugary drinks this morning will increase his BG and if it did I would add low-sugar to his diet. Would recommend continuing this as he had severe hyperglycemia to 276 mg/dL today. Otherwise, no further changes. Will continue to monitor.      Recommendations:  - Add low-sugar to diet order  - Adjust to NPH 7 units Q12H  - Adjust to Lispro 6 units TIDAC  - Lispro 1:30 > 140 ACHS  - Hypoglycemia protocol.  - POCT-BG achs.  - Ensure patient is on glucose precautions if patient taking nutrition by mouth.     Discharge planning:  In process. Will complete closer to discharge. Likely resume home regimen given current A1C. We briefly discussed starting a GLP1ra such as ozempic for dual cardiac benefit and glycemic management. He stated he has been discussing starting this with his PCP and is amenable to starting this outpatient and can follow-up with PCP once it is started. Tentative plan below:  - Resume metformin 100 mg BID AC  - START semaglutide (Ozempic) once every seven (7) days:   - Semaglutide (Ozempic) 0.25 mg for 28 days   - Semaglutdie (Ozempic) 0.50 mg for 28 days   - Semaglutide (Ozempic) 1 mg for 28 days  - Will need close follow-up outpatient with PCP, recommend appt with PCP 2-3 weeks post-discharge from hospital  - You can continue to check your BGs as you were  *Med red updated 12/1    Thank you for this consult. Discussed plan with primary team. We will continue to follow and make recommendations and place orders as appropriate.    Please page with questions or concerns: Christopher Christmas, NP: [phone]  Endocrinology Diabetes Care Team on call from 6AM -  3PM on weekdays then endocrine fellow on call: [phone] from 3PM - 6AM on weekdays and on weekends and holidays.   If APP cannot be reached, please page the endocrine fellow on call.      Subjective:  Interval Encounter:  Pt more alert today and states he is doing very well, denies n/v. Endorses improved PO intake, does not mind adding low-sugar to diet if it will help control his blood sugars. No acute concerns or questions noted at time of rounds.    Initial HPI:  Christopher [name] is a 71 y.o. male with past medical history of accelerating angina, native coronary artery disease (LAD and OM), HLD, T2DM (HgbA1C 7.9), HTN, statin intolerance, TIA who presents for evaluation for Coronary Artery Bypass Grafting. Christopher [name] reports that he has CP/Angina every day, even at rest. However, this is worsened by exertion. He denies other associated symptoms.    He is now s/p CABGx2 (LIMA to LAD, RSVG to OM) 11/27       Diabetes History:  Patient has a history of Type 2 diabetes diagnosed about 10 years ago.  Diabetes is managed by: PCP.  Current home diabetes regimen: metformin 1000 mg BID AC.  Current home blood glucose monitoring: daily.  Hypoglycemia awareness: Aware.  Complications related to diabetes: retinopathy and chronic kidney disease      Current Nutrition:  Active Orders   Diet    Nutrition Therapy Regular/House       ROS: As per HPI.     acetaminophen  1,000 mg Oral QID    aspirin  81 mg Oral Daily    clopidogreL  75 mg Oral Daily    colchicine  0.6 mg Oral BID    docusate sodium  100 mg Oral BID    gabapentin  300 mg Oral BID    heparin (porcine) for subcutaneous use  5,000 Units Subcutaneous Q8H SCH    insulin lispro  0-9 Units Subcutaneous ACHS    insulin lispro  8 Units Subcutaneous 3xd Meals    insulin NPH  9 Units Subcutaneous Q12H SCH    lidocaine  3 patch Transdermal Daily    magnesium oxide  400 mg Oral BID    melatonin  6 mg Oral QPM    metoPROLOL tartrate  12.5 mg Oral Q8H SCH    mupirocin   Each Nare BID    naloxegoL  25 mg Oral Daily    polyethylene glycol  17 g Oral 3xd Meals    potassium chloride  10 mEq Oral Daily    pravastatin  40 mg Oral Daily    sertraline  100 mg Oral Daily    tamsulosin  0.4 mg Oral Nightly    traZODone  150 mg Oral Nightly       Current Outpatient Medications   Medication Instructions    alirocumab 75 mg/mL PnIj Inject the contents of 1 pen (75 mg) under the skin every fourteen (14) days.    amlodipine (NORVASC) 10 mg, Oral, Daily (standard), For high blood pressure    aspirin (ECOTRIN) 81 mg, Oral, Daily (standard)    blood sugar diagnostic (ONETOUCH ULTRA TEST) Strp USE ONCE DAILY TO CHECK FASTING BLOOD GLUCOSE    blood-glucose meter Misc 1 Device, Intradermal, Daily (standard), DX: E11.8    clopidogreL (PLAVIX) 75 mg, Oral, Daily (standard)    diclofenac sodium (VOLTAREN) 1 % gel APPLY 2 GRAMS TO AFFECTED  AREA(S) TOPICALLY 4 TIMES DAILY    diclofenac sodium (VOLTAREN)  2 g, Topical, 4 times a day    empty container (SHARPS-A-GATOR DISPOSAL SYSTEM) Misc Use as directed for sharps disposal    fluticasone propionate (FLONASE) 50 mcg/actuation nasal spray 2 sprays, Each Nare, Daily (standard)    gabapentin (NEURONTIN) 300 MG capsule TAKE 1 CAPSULE BY MOUTH TWICE  DAILY    isosorbide mononitrate (IMDUR) 30 mg, Oral, 2 times a day    metFORMIN (GLUCOPHAGE) 1,000 mg, Oral, 2 times a day with meals    metoPROLOL succinate (TOPROL-XL) 100 mg, Oral, Daily (standard)    nitroglycerin (NITROSTAT) 0.4 mg, Sublingual, Every 5 min PRN    omeprazole (PRILOSEC) 20 mg, Oral, Daily (standard)    pravastatin (PRAVACHOL) 10 mg, Oral, Daily (standard)    sertraline (ZOLOFT) 100 mg, Oral, Daily (standard)    tamsulosin (FLOMAX) 0.4 mg, Oral, Daily (standard)    traZODone (DESYREL) 150 mg, Oral, Nightly    trospium (SANCTURA) 20 mg, Oral, Nightly           Past Medical History:   Diagnosis Date    Acute kidney injury (CMS-HCC) 01/12/2018    Anemia     IRON DEFICIENT     Anxiety dont know    Arthritis     Basal cell carcinoma     Cataract     CHF (congestive heart failure) (CMS-HCC)     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    Corneal abrasion 1975    steel in right eye with abrasion and removal    Cubital tunnel syndrome on left 01/16/2019    Depression 2016    Diabetes mellitus (CMS-HCC)     GERD (gastroesophageal reflux disease) 1982    Hypertension     Meningitis 12/25/2015    Mixed hyperlipidemia     Myocardial infarction (CMS-HCC) 1986    Obesity 1986    first back surgery    Peptic ulceration        Past Surgical History:   Procedure Laterality Date    BACK SURGERY      CARDIAC CATHETERIZATION      STENTS PLACEMENT     CARDIAC SURGERY  2005    stents    CORONARY STENT PLACEMENT      FOREIGN BODY REMOVAL Right about 20 years ago    HERNIA REPAIR      RIGHT INGUNIAL    JOINT REPLACEMENT Left     TOTAL SHOULDER    PR ARM/ELBOW TENDON LENGTHEN,SINGLE,EA Left 02/17/2019    Procedure: TENDON LENGTHENING UPPER ARM/ELBOW SNGL EA;  Surgeon: Daisy Lazar, MD;  Location: ASC OR [facility];  Service: Orthopedics    PR CABG, ARTERIAL, SINGLE Midline 03/20/2022    Procedure: CORONARY ARTERY BYPASS, USING ARTERIAL GRAFT(S); SINGLE ARTERIAL GRAFT;  Surgeon: Christopher Marker, MD;  Location: MAIN OR [facility];  Service: Cardiac Surgery    PR CABG, ARTERY-VEIN, SINGLE Midline 03/20/2022    Procedure: CORONARY ARTERY BYPASS, USING VENOUS GRAFT(S) AND ARTERIAL GRAFT(S); SINGLE VEIN GRAFT;  Surgeon: Christopher Marker, MD;  Location: MAIN OR [facility];  Service: Cardiac Surgery    PR CATH PLACE/CORON ANGIO, IMG SUPER/INTERP,W LEFT HEART VENTRICULOGRAPHY N/A 02/01/2022    Procedure: Left Heart Catheterization;  Surgeon: Jacquelyne Balint, MD;  Location: [facility] CATH;  Service: Cardiology    PR ENDOSCOPY W/VIDEO-ASST VEIN HARVEST,CABG Right 03/20/2022    Procedure: ENDOSCOPY, SURGICAL, INCLUDING VIDEO-ASSISTED HARVEST OF VEIN(S) FOR CORONARY ARTERY BYPASS PROCEDURE;  Surgeon: Christopher Marker, MD;  Location: MAIN OR [facility];  Service: Cardiac Surgery  PR OSTEOTOMY 1ST METATARSAL,BASE/SHAFT Left 07/20/2017    Procedure: OSTEOTOMY, W/WO LENGTHENING, SHORTENING OR ANGULAR CORRECTION, METATARSAL; FIRST METATARSAL;  Surgeon: Valda Favia, MD;  Location: ASC OR [facility];  Service: Ortho Foot & Ankle    PR OSTEOTOMY HEEL BONE Left 07/20/2017    Procedure: OSTEOTOMYLoistine Chance W/WO INT FIXA;  Surgeon: Valda Favia, MD; Location: ASC OR [facility];  Service: Ortho Foot & Ankle    PR OSTEOTOMY METATARSAL (NOT 1ST) Left 06/06/2018    Procedure: OSTEOTOMY, W/WO LENGTHENING, SHORTENING OR ANGULAR CORRECTION, METATARSAL; OTHER THAN FIRST METATARSAL, EA;  Surgeon: Valda Favia, MD;  Location: ASC OR [facility];  Service: Orthopedics    PR PART REMV PHALANX OF TOE Left 06/06/2018    Procedure: PART EXC BONE PHALANX TOE;  Surgeon: Valda Favia, MD;  Location: ASC OR [facility];  Service: Orthopedics    PR RECONSTR TOTAL SHOULDER IMPLANT Left 11/12/2018    Procedure: R28   ARTHROPLASTY, GLENOHUMERAL JOINT; TOTAL SHOULDER(GLENOID & PROXIMAL HUMERAL REPLACEMENT(EG, TOTAL SHOULDER);  Surgeon: Nickola Major, MD;  [facility]: [facility] OR [facility];  Service: Orthopedics    PR REMOVAL DEEP IMPLANT Left 11/12/2018    Procedure: REMOVE IMPLANT; DEEP SHOULDER;  Surgeon: Nickola Major, MD;  Location: [facility] OR [facility];  Service: Orthopedics    PR REPAIR BICEPS LONG TENDON Left 11/12/2018    Procedure: TENODESIS LONG TENDON BICEPS;  Surgeon: Nickola Major, MD;  Location: [facility] OR [facility];  Service: Orthopedics    PR REPAIR PERONEAL TENDONS,FIB OSTEOTMY Left 07/20/2017    Procedure: REPR DISLOC PERONEAL TENDONS; W/FIB OSTEOTOMY;  Surgeon: Valda Favia, MD;  Location: ASC OR [facility];  Service: Ortho Foot & Ankle    PR REVISE MEDIAN N/CARPAL TUNNEL SURG Left 02/17/2019    Procedure: R22 NEUROPLASTY AND/OR TRANSPOSITION; MEDIAN NERVE AT CARPAL TUNNEL;  Surgeon: Theodora Blow Jacqlyn Krauss, MD;  Location: ASC OR [facility];  Service: Orthopedics    PR REVISE ULNAR NERVE AT ELBOW Left 02/17/2019    Procedure: NEUROPLASTY AND/OR TRANSPOSITION; ULNAR NERVE AT ELBOW;  Surgeon: Daisy Lazar, MD;  Location: ASC OR [facility];  Service: Orthopedics    SHOULDER SURGERY Bilateral     L TSA, R ROTATOR CUFF      SKIN BIOPSY      SPINE SURGERY  1986/1995/2000/2002    LUMBAR FUSION    stent         Family History Problem Relation Age of Onset    Diabetes Mother     Stroke Mother     Cataracts Mother     Hypertension Mother     Alcohol abuse Father     Cancer Father     Diabetes Father     Cataracts Father     Hypertension Father     Melanoma Father     Hypertension Maternal Grandmother     Cancer Maternal Grandmother     Hypertension Maternal Grandfather     Hypertension Paternal Grandmother     Cataracts Paternal Grandfather     Hypertension Paternal Grandfather     COPD Brother         moker and alcholic    Early death Brother     COPD Sister         was a smoker    Diabetes Sister     COPD Brother         moker    Diabetes Brother     Diabetes Sister     Kidney  disease Maternal Aunt         died from kidney diease due to diabeties    No Known Problems Maternal Uncle     No Known Problems Paternal Aunt     No Known Problems Paternal Uncle     No Known Problems Other     Blindness Neg Hx     Thyroid disease Neg Hx     Macular degeneration Neg Hx     Glaucoma Neg Hx     Basal cell carcinoma Neg Hx     Squamous cell carcinoma Neg Hx     Anesthesia problems Neg Hx     Broken bones Neg Hx     Clotting disorder Neg Hx     Collagen disease Neg Hx     Dislocations Neg Hx     Fibromyalgia Neg Hx     Gout Neg Hx     Hemophilia Neg Hx     Osteoporosis Neg Hx     Rheumatologic disease Neg Hx     Scoliosis Neg Hx     Severe sprains Neg Hx     Sickle cell anemia Neg Hx     Spinal Compression Fracture Neg Hx        Social History     Tobacco Use    Smoking status: Never    Smokeless tobacco: Never    Tobacco comments:     never will   Vaping Use    Vaping Use: Never used   Substance Use Topics    Alcohol use: No     Alcohol/week: 0.0 standard drinks of alcohol    Drug use: Not Currently       OBJECTIVE:  BP 134/79  - Pulse 81  - Temp 36.6 ??C (97.9 ??F)  - Resp 14  - Ht 167.6 cm (5' 5.98)  - Wt 88.9 kg (195 lb 15.8 oz)  - SpO2 96%  - BMI 31.65 kg/m??   Wt Readings from Last 12 Encounters:   03/23/22 88.9 kg (195 lb 15.8 oz)   03/02/22 83.6 kg (184 lb 3.2 oz)   03/01/22 83 kg (183 lb)   02/21/22 81.2 kg (179 lb)   02/01/22 81.2 kg (179 lb)   01/31/22 81.6 kg (179 lb 12.8 oz)   12/22/21 81.6 kg (180 lb)   11/16/21 81.9 kg (180 lb 9.6 oz)   08/16/21 87.6 kg (193 lb 3.2 oz)   08/09/21 86.2 kg (190 lb 0.3 oz)   05/24/21 90.3 kg (199 lb)   05/11/21 88.9 kg (196 lb)     Physical Exam  Vitals and nursing note reviewed.   Constitutional:       General: He is not in acute distress.     Appearance: He is not ill-appearing.   HENT:      Head: Normocephalic and atraumatic.   Cardiovascular:      Rate and Rhythm: Normal rate and regular rhythm.   Pulmonary:      Effort: Pulmonary effort is normal. No respiratory distress.      Comments: Supplemental O2 via [place]  Skin:     General: Skin is warm and dry.      Comments: Surgical incisions, mid-sternal, s/p CABG 11/27   Neurological:      Mental Status: He is alert and oriented to person, place, and time.   Psychiatric:         Mood and Affect: Mood normal.         Behavior: Behavior  normal.         Thought Content: Thought content normal.         Judgment: Judgment normal.             BG/insulin reviewed per EMR.   Glucose, POC (mg/dL)   Date Value   16/01/9603 153   03/23/2022 117   03/23/2022 175   03/23/2022 127   03/22/2022 157   03/22/2022 170   03/22/2022 197 (H)   03/21/2022 115   12/28/2015 121   12/28/2015 139   12/28/2015 136   12/27/2015 118   12/27/2015 113   12/27/2015 116   12/27/2015 149   12/26/2015 178        Summary of labs:  Lab Results   Component Value Date    A1C 7.9 (H) 12/22/2021    A1C 7.8 (H) 08/09/2021    A1C 7.7 (H) 04/05/2021     Lab Results   Component Value Date    CREATININE 0.89 03/24/2022     Lab Results   Component Value Date    WBC 5.5 03/24/2022    HGB 8.3 (L) 03/24/2022    HCT 24.5 (L) 03/24/2022    PLT 152 03/24/2022       Lab Results   Component Value Date    NA 142 03/24/2022    K 3.9 03/24/2022    CL 106 03/24/2022    CO2 32.0 (H) 03/24/2022    BUN 18 03/24/2022 CREATININE 0.89 03/24/2022    GLU 112 03/24/2022    CALCIUM 8.4 (L) 03/24/2022    MG 1.7 03/24/2022    PHOS 2.6 03/24/2022       Lab Results   Component Value Date    BILITOT 0.6 03/20/2022    BILIDIR 0.20 03/20/2022    PROT 5.8 03/20/2022    ALBUMIN 3.5 03/20/2022    ALT 12 03/20/2022    AST 31 03/20/2022    ALKPHOS 41 (L) 03/20/2022

## 2022-03-24 NOTE — Unmapped (Signed)
Neuro: AOx4    Cardiac: Tele NSR. CT x3 to WS, minimum output    Respiratory: 3L diminished. Unable to wean    GI: BM PTA. Hypoactive bowel sounds, passing gas.     GU: voiding    MSK: generalized weakness    Skin: surgical incision    Pain: denies    Problem: Adult Inpatient Plan of Care  Goal: Plan of Care Review  Outcome: Ongoing - Unchanged  Goal: Patient-Specific Goal (Individualized)  Outcome: Ongoing - Unchanged  Goal: Absence of Hospital-Acquired Illness or Injury  Outcome: Ongoing - Unchanged  Intervention: Identify and Manage Fall Risk  Recent Flowsheet Documentation  Taken 03/23/2022 2000 by Ivonne Andrew, RN  Safety Interventions:   aspiration precautions   environmental modification   fall reduction program maintained   lighting adjusted for tasks/safety  Intervention: Prevent Skin Injury  Recent Flowsheet Documentation  Taken 03/24/2022 0400 by Ivonne Andrew, RN  Positioning for Skin: Supine/Back  Taken 03/24/2022 0200 by Ivonne Andrew, RN  Positioning for Skin: Supine/Back  Taken 03/24/2022 0000 by Ivonne Andrew, RN  Positioning for Skin: Supine/Back  Taken 03/23/2022 2200 by Ivonne Andrew, RN  Positioning for Skin: Supine/Back  Taken 03/23/2022 2000 by Ivonne Andrew, RN  Positioning for Skin: Supine/Back  Goal: Optimal Comfort and Wellbeing  Outcome: Ongoing - Unchanged  Goal: Readiness for Transition of Care  Outcome: Ongoing - Unchanged  Goal: Rounds/Family Conference  Outcome: Ongoing - Unchanged     Problem: Wound  Goal: Optimal Coping  Outcome: Ongoing - Unchanged  Goal: Optimal Functional Ability  Outcome: Ongoing - Unchanged  Goal: Absence of Infection Signs and Symptoms  Outcome: Ongoing - Unchanged  Goal: Improved Oral Intake  Outcome: Ongoing - Unchanged  Goal: Optimal Pain Control and Function  Outcome: Ongoing - Unchanged  Goal: Skin Health and Integrity  Outcome: Ongoing - Unchanged  Intervention: Optimize Skin Protection  Recent Flowsheet Documentation  Taken 03/24/2022 0400 by Ivonne Andrew, RN  Pressure Reduction Techniques: frequent weight shift encouraged  Pressure Reduction Devices: pressure-redistributing mattress utilized  Taken 03/24/2022 0200 by Ivonne Andrew, RN  Pressure Reduction Techniques: frequent weight shift encouraged  Pressure Reduction Devices: pressure-redistributing mattress utilized  Taken 03/24/2022 0000 by Ivonne Andrew, RN  Pressure Reduction Techniques: frequent weight shift encouraged  Pressure Reduction Devices: pressure-redistributing mattress utilized  Taken 03/23/2022 2200 by Ivonne Andrew, RN  Pressure Reduction Techniques: frequent weight shift encouraged  Pressure Reduction Devices: pressure-redistributing mattress utilized  Taken 03/23/2022 2000 by Ivonne Andrew, RN  Pressure Reduction Techniques: frequent weight shift encouraged  Pressure Reduction Devices: pressure-redistributing mattress utilized  Goal: Optimal Wound Healing  Outcome: Ongoing - Unchanged     Problem: Skin Injury Risk Increased  Goal: Skin Health and Integrity  Outcome: Ongoing - Unchanged  Intervention: Optimize Skin Protection  Recent Flowsheet Documentation  Taken 03/24/2022 0400 by Ivonne Andrew, RN  Pressure Reduction Techniques: frequent weight shift encouraged  Pressure Reduction Devices: pressure-redistributing mattress utilized  Taken 03/24/2022 0200 by Ivonne Andrew, RN  Pressure Reduction Techniques: frequent weight shift encouraged  Pressure Reduction Devices: pressure-redistributing mattress utilized  Taken 03/24/2022 0000 by Ivonne Andrew, RN  Pressure Reduction Techniques: frequent weight shift encouraged  Pressure Reduction Devices: pressure-redistributing mattress utilized  Taken 03/23/2022 2200 by Ivonne Andrew, RN  Pressure Reduction Techniques: frequent weight shift encouraged  Pressure Reduction Devices: pressure-redistributing mattress utilized  Taken 03/23/2022 2000 by Ivonne Andrew, RN  Pressure Reduction Techniques: frequent weight shift encouraged  Pressure Reduction Devices: pressure-redistributing mattress utilized  Problem: Fall Injury Risk  Goal: Absence of Fall and Fall-Related Injury  Outcome: Ongoing - Unchanged  Intervention: Promote Scientist, clinical (histocompatibility and immunogenetics) Documentation  Taken 03/23/2022 2000 by Ivonne Andrew, RN  Safety Interventions:   aspiration precautions   environmental modification   fall reduction program maintained   lighting adjusted for tasks/safety     Problem: Self-Care Deficit  Goal: Improved Ability to Complete Activities of Daily Living  Outcome: Ongoing - Unchanged     Problem: Comorbidity Management  Goal: Blood Glucose Levels Within Targeted Range  Outcome: Ongoing - Unchanged

## 2022-03-25 LAB — CBC
HEMATOCRIT: 28.4 % — ABNORMAL LOW (ref 39.0–48.0)
HEMOGLOBIN: 9.3 g/dL — ABNORMAL LOW (ref 12.9–16.5)
MEAN CORPUSCULAR HEMOGLOBIN CONC: 32.8 g/dL (ref 32.0–36.0)
MEAN CORPUSCULAR HEMOGLOBIN: 30.6 pg (ref 25.9–32.4)
MEAN CORPUSCULAR VOLUME: 93.5 fL (ref 77.6–95.7)
MEAN PLATELET VOLUME: 7.3 fL (ref 6.8–10.7)
PLATELET COUNT: 187 10*9/L (ref 150–450)
RED BLOOD CELL COUNT: 3.04 10*12/L — ABNORMAL LOW (ref 4.26–5.60)
RED CELL DISTRIBUTION WIDTH: 13.7 % (ref 12.2–15.2)
WBC ADJUSTED: 6.1 10*9/L (ref 3.6–11.2)

## 2022-03-25 LAB — BASIC METABOLIC PANEL
ANION GAP: 4 mmol/L — ABNORMAL LOW (ref 5–14)
BLOOD UREA NITROGEN: 17 mg/dL (ref 9–23)
BUN / CREAT RATIO: 19
CALCIUM: 9 mg/dL (ref 8.7–10.4)
CHLORIDE: 105 mmol/L (ref 98–107)
CO2: 33 mmol/L — ABNORMAL HIGH (ref 20.0–31.0)
CREATININE: 0.89 mg/dL
EGFR CKD-EPI (2021) MALE: 90 mL/min/{1.73_m2} (ref >=60)
GLUCOSE RANDOM: 118 mg/dL (ref 70–179)
POTASSIUM: 3.9 mmol/L (ref 3.4–4.8)
SODIUM: 142 mmol/L (ref 135–145)

## 2022-03-25 LAB — MAGNESIUM: MAGNESIUM: 1.8 mg/dL (ref 1.6–2.6)

## 2022-03-25 LAB — PHOSPHORUS: PHOSPHORUS: 3 mg/dL (ref 2.4–5.1)

## 2022-03-25 MED ADMIN — metoPROLOL tartrate (LOPRESSOR) tablet 12.5 mg: 12.5 mg | ORAL | @ 20:00:00

## 2022-03-25 MED ADMIN — insulin lispro (HumaLOG) injection 0-9 Units: 0-9 [IU] | SUBCUTANEOUS | @ 03:00:00

## 2022-03-25 MED ADMIN — tamsulosin (FLOMAX) 24 hr capsule 0.4 mg: .4 mg | ORAL | @ 02:00:00

## 2022-03-25 MED ADMIN — pravastatin (PRAVACHOL) tablet 40 mg: 40 mg | ORAL | @ 14:00:00

## 2022-03-25 MED ADMIN — acetaminophen (TYLENOL) tablet 1,000 mg: 1000 mg | ORAL | @ 18:00:00

## 2022-03-25 MED ADMIN — gabapentin (NEURONTIN) capsule 300 mg: 300 mg | ORAL | @ 14:00:00

## 2022-03-25 MED ADMIN — acetaminophen (TYLENOL) tablet 1,000 mg: 1000 mg | ORAL | @ 23:00:00

## 2022-03-25 MED ADMIN — metoPROLOL tartrate (LOPRESSOR) tablet 12.5 mg: 12.5 mg | ORAL | @ 03:00:00

## 2022-03-25 MED ADMIN — heparin (porcine) 5,000 unit/mL injection 5,000 Units: 5000 [IU] | SUBCUTANEOUS | @ 20:00:00

## 2022-03-25 MED ADMIN — heparin (porcine) 5,000 unit/mL injection 5,000 Units: 5000 [IU] | SUBCUTANEOUS | @ 10:00:00

## 2022-03-25 MED ADMIN — oxyCODONE (ROXICODONE) immediate release tablet 5 mg: 5 mg | ORAL | @ 02:00:00 | Stop: 2022-04-03

## 2022-03-25 MED ADMIN — sertraline (ZOLOFT) tablet 100 mg: 100 mg | ORAL | @ 14:00:00

## 2022-03-25 MED ADMIN — clopidogreL (PLAVIX) tablet 75 mg: 75 mg | ORAL | @ 14:00:00

## 2022-03-25 MED ADMIN — aspirin chewable tablet 81 mg: 81 mg | ORAL | @ 14:00:00

## 2022-03-25 MED ADMIN — magnesium oxide (MAG-OX) tablet 400 mg: 400 mg | ORAL | @ 02:00:00

## 2022-03-25 MED ADMIN — acetaminophen (TYLENOL) tablet 1,000 mg: 1000 mg | ORAL | @ 02:00:00

## 2022-03-25 MED ADMIN — potassium chloride ER tablet 10 mEq: 10 meq | ORAL | @ 14:00:00

## 2022-03-25 MED ADMIN — heparin (porcine) 5,000 unit/mL injection 5,000 Units: 5000 [IU] | SUBCUTANEOUS | @ 03:00:00

## 2022-03-25 MED ADMIN — gabapentin (NEURONTIN) capsule 300 mg: 300 mg | ORAL | @ 02:00:00

## 2022-03-25 MED ADMIN — colchicine (COLCRYS) tablet 0.6 mg: .6 mg | ORAL | @ 02:00:00 | Stop: 2022-03-24

## 2022-03-25 MED ADMIN — metoPROLOL tartrate (LOPRESSOR) tablet 12.5 mg: 12.5 mg | ORAL | @ 10:00:00

## 2022-03-25 MED ADMIN — insulin lispro (HumaLOG) injection 6 Units: 6 [IU] | SUBCUTANEOUS | @ 14:00:00

## 2022-03-25 MED ADMIN — melatonin tablet 6 mg: 6 mg | ORAL | @ 02:00:00

## 2022-03-25 MED ADMIN — insulin NPH (HumuLIN,NovoLIN) injection 7 Units: 7 [IU] | SUBCUTANEOUS | @ 14:00:00

## 2022-03-25 MED ADMIN — docusate sodium (COLACE) capsule 100 mg: 100 mg | ORAL | @ 02:00:00

## 2022-03-25 MED ADMIN — mupirocin (BACTROBAN) 2 % ointment: NASAL | @ 02:00:00 | Stop: 2022-03-24

## 2022-03-25 MED ADMIN — magnesium oxide (MAG-OX) tablet 400 mg: 400 mg | ORAL | @ 14:00:00

## 2022-03-25 MED ADMIN — naloxegoL (MOVANTIK) tablet 25 mg: 25 mg | ORAL | @ 14:00:00 | Stop: 2022-03-25

## 2022-03-25 MED ADMIN — insulin NPH (HumuLIN,NovoLIN) injection 7 Units: 7 [IU] | SUBCUTANEOUS | @ 03:00:00

## 2022-03-25 MED ADMIN — traZODone (DESYREL) tablet 150 mg: 150 mg | ORAL | @ 02:00:00

## 2022-03-25 MED ADMIN — acetaminophen (TYLENOL) tablet 1,000 mg: 1000 mg | ORAL | @ 10:00:00

## 2022-03-25 MED ADMIN — furosemide (LASIX) tablet 20 mg: 20 mg | ORAL | @ 18:00:00

## 2022-03-25 MED ADMIN — insulin lispro (HumaLOG) injection 6 Units: 6 [IU] | SUBCUTANEOUS | @ 18:00:00

## 2022-03-25 NOTE — Unmapped (Signed)
A&O x4, VSS, NSR per tele. CT to water seal, minimal output this shift. Oxygen weaned to 1L, O2 sat stable. Adequate UOP. 1 BM this shift. Pt in bed at this time.     Problem: Adult Inpatient Plan of Care  Goal: Absence of Hospital-Acquired Illness or Injury  Intervention: Identify and Manage Fall Risk  Recent Flowsheet Documentation  Taken 03/24/2022 0800 by Graciella Belton, RN  Safety Interventions:   fall reduction program maintained   low bed  Intervention: Prevent Skin Injury  Recent Flowsheet Documentation  Taken 03/24/2022 1800 by Graciella Belton, RN  Positioning for Skin: Sitting in Chair  Taken 03/24/2022 1600 by Graciella Belton, RN  Positioning for Skin: Sitting in Chair  Taken 03/24/2022 1400 by Graciella Belton, RN  Positioning for Skin: Sitting in Chair  Taken 03/24/2022 1200 by Graciella Belton, RN  Positioning for Skin: Supine/Back  Taken 03/24/2022 1000 by Graciella Belton, RN  Positioning for Skin: Supine/Back  Taken 03/24/2022 0800 by Graciella Belton, RN  Positioning for Skin: Supine/Back  Device Skin Pressure Protection: absorbent pad utilized/changed  Skin Protection: incontinence pads utilized  Intervention: Prevent and Manage VTE (Venous Thromboembolism) Risk  Recent Flowsheet Documentation  Taken 03/24/2022 1800 by Graciella Belton, RN  Anti-Embolism Device Type: SCD, Knee  Anti-Embolism Intervention: Refused  Anti-Embolism Device Location: BLE  Taken 03/24/2022 1600 by Graciella Belton, RN  Anti-Embolism Device Type: SCD, Knee  Anti-Embolism Intervention: Refused  Anti-Embolism Device Location: BLE  Taken 03/24/2022 1400 by Graciella Belton, RN  Anti-Embolism Device Type: SCD, Knee  Anti-Embolism Intervention: Refused  Anti-Embolism Device Location: BLE  Taken 03/24/2022 1200 by Graciella Belton, RN  Anti-Embolism Device Type: SCD, Knee  Anti-Embolism Intervention: Refused  Anti-Embolism Device Location: BLE  Taken 03/24/2022 1000 by Graciella Belton, RN  Anti-Embolism Device Type: SCD, Knee  Anti-Embolism Intervention: Refused  Anti-Embolism Device Location: BLE  Taken 03/24/2022 0800 by Graciella Belton, RN  Anti-Embolism Device Type: SCD, Knee  Anti-Embolism Intervention: Refused  Anti-Embolism Device Location: BLE  Intervention: Prevent Infection  Recent Flowsheet Documentation  Taken 03/24/2022 0800 by Graciella Belton, RN  Infection Prevention:   environmental surveillance performed   cohorting utilized   hand hygiene promoted     Problem: Wound  Goal: Absence of Infection Signs and Symptoms  Intervention: Prevent or Manage Infection  Recent Flowsheet Documentation  Taken 03/24/2022 0800 by Graciella Belton, RN  Infection Management: aseptic technique maintained  Goal: Skin Health and Integrity  Intervention: Optimize Skin Protection  Recent Flowsheet Documentation  Taken 03/24/2022 0800 by Graciella Belton, RN  Pressure Reduction Techniques: frequent weight shift encouraged  Pressure Reduction Devices: pressure-redistributing mattress utilized  Skin Protection: incontinence pads utilized     Problem: Skin Injury Risk Increased  Goal: Skin Health and Integrity  Intervention: Optimize Skin Protection  Recent Flowsheet Documentation  Taken 03/24/2022 0800 by Graciella Belton, RN  Pressure Reduction Techniques: frequent weight shift encouraged  Pressure Reduction Devices: pressure-redistributing mattress utilized  Skin Protection: incontinence pads utilized     Problem: Fall Injury Risk  Goal: Absence of Fall and Fall-Related Injury  Intervention: Promote Injury-Free Management consultant Documentation  Taken 03/24/2022 0800 by Graciella Belton, RN  Safety Interventions:   fall reduction program maintained   low bed     Problem: Mechanical Ventilation Invasive  Goal: Absence of Device-Related Skin and Tissue Injury  Intervention: Maintain Skin and Tissue Health  Recent Flowsheet Documentation  Taken 03/24/2022 0800 by Graciella Belton, RN  Device Skin Pressure Protection: absorbent pad utilized/changed  Problem: Comorbidity Management  Goal: Blood Glucose Levels Within Targeted Range  Intervention: Monitor and Manage Glycemia  Recent Flowsheet Documentation  Taken 03/24/2022 0800 by Graciella Belton, RN  Glycemic Management: blood glucose monitored

## 2022-03-25 NOTE — Unmapped (Signed)
Patient is A&Ox4. VSS, NSR per tele. O2 saturations WNL on 1 L O2 [place]. Ct x1 to WS, minimal output. Patient voiding with condom cath in place, adequate urine output. No BM this shift. Pain managed with PRN oxycodone. Patient ambulating outside room x1 this shift.       Problem: Adult Inpatient Plan of Care  Goal: Plan of Care Review  Outcome: Progressing  Goal: Patient-Specific Goal (Individualized)  Outcome: Progressing  Goal: Absence of Hospital-Acquired Illness or Injury  Outcome: Progressing  Intervention: Identify and Manage Fall Risk  Recent Flowsheet Documentation  Taken 03/24/2022 2000 by Garlon Hatchet, RN  Safety Interventions:   commode/urinal/bedpan at bedside   fall reduction program maintained   lighting adjusted for tasks/safety   low bed   nonskid shoes/slippers when out of bed  Intervention: Prevent Skin Injury  Recent Flowsheet Documentation  Taken 03/25/2022 0000 by Garlon Hatchet, RN  Positioning for Skin: Supine/Back  Device Skin Pressure Protection:   absorbent pad utilized/changed   adhesive use limited  Skin Protection: adhesive use limited  Taken 03/24/2022 2200 by Garlon Hatchet, RN  Positioning for Skin: Supine/Back  Device Skin Pressure Protection:   absorbent pad utilized/changed   adhesive use limited  Skin Protection: adhesive use limited  Taken 03/24/2022 2000 by Garlon Hatchet, RN  Positioning for Skin: Supine/Back  Device Skin Pressure Protection:   absorbent pad utilized/changed   adhesive use limited  Skin Protection: adhesive use limited  Intervention: Prevent and Manage VTE (Venous Thromboembolism) Risk  Recent Flowsheet Documentation  Taken 03/25/2022 0000 by Garlon Hatchet, RN  VTE Prevention/Management:   anticoagulant therapy   ambulation promoted  Anti-Embolism Device Type: SCD, Knee  Anti-Embolism Intervention: Refused  Anti-Embolism Device Location: BLE  Taken 03/24/2022 2200 by Garlon Hatchet, RN  Anti-Embolism Device Type: SCD, Knee  Anti-Embolism Intervention: Refused  Anti-Embolism Device Location: BLE  Taken 03/24/2022 2000 by Garlon Hatchet, RN  VTE Prevention/Management:   anticoagulant therapy   ambulation promoted  Anti-Embolism Device Type: SCD, Knee  Anti-Embolism Intervention: Refused  Anti-Embolism Device Location: BLE  Intervention: Prevent Infection  Recent Flowsheet Documentation  Taken 03/24/2022 2000 by Garlon Hatchet, RN  Infection Prevention: hand hygiene promoted  Goal: Optimal Comfort and Wellbeing  Outcome: Progressing  Goal: Readiness for Transition of Care  Outcome: Progressing  Goal: Rounds/Family Conference  Outcome: Progressing     Problem: Wound  Goal: Optimal Coping  Outcome: Progressing  Goal: Optimal Functional Ability  Outcome: Progressing  Intervention: Optimize Functional Ability  Recent Flowsheet Documentation  Taken 03/24/2022 1940 by Garlon Hatchet, RN  Activity Management: ambulated outside room  Goal: Absence of Infection Signs and Symptoms  Outcome: Progressing  Intervention: Prevent or Manage Infection  Recent Flowsheet Documentation  Taken 03/24/2022 2000 by Garlon Hatchet, RN  Infection Management: aseptic technique maintained  Goal: Improved Oral Intake  Outcome: Progressing  Goal: Optimal Pain Control and Function  Outcome: Progressing  Goal: Skin Health and Integrity  Outcome: Progressing  Intervention: Optimize Skin Protection  Recent Flowsheet Documentation  Taken 03/25/2022 0000 by Garlon Hatchet, RN  Pressure Reduction Techniques: frequent weight shift encouraged  Skin Protection: adhesive use limited  Taken 03/24/2022 2200 by Garlon Hatchet, RN  Pressure Reduction Techniques: frequent weight shift encouraged  Skin Protection: adhesive use limited  Taken 03/24/2022 2000 by Garlon Hatchet, RN  Pressure Reduction Techniques: frequent weight shift encouraged  Skin Protection: adhesive use limited  Taken 03/24/2022 1940 by Garlon Hatchet, RN  Activity Management: ambulated outside room  Goal: Optimal Wound  Healing  Outcome: Progressing     Problem: Skin Injury Risk Increased  Goal: Skin Health and Integrity  Outcome: Progressing  Intervention: Optimize Skin Protection  Recent Flowsheet Documentation  Taken 03/25/2022 0000 by Garlon Hatchet, RN  Pressure Reduction Techniques: frequent weight shift encouraged  Skin Protection: adhesive use limited  Taken 03/24/2022 2200 by Garlon Hatchet, RN  Pressure Reduction Techniques: frequent weight shift encouraged  Skin Protection: adhesive use limited  Taken 03/24/2022 2000 by Garlon Hatchet, RN  Pressure Reduction Techniques: frequent weight shift encouraged  Skin Protection: adhesive use limited  Taken 03/24/2022 1940 by Garlon Hatchet, RN  Activity Management: ambulated outside room     Problem: Fall Injury Risk  Goal: Absence of Fall and Fall-Related Injury  Outcome: Progressing  Intervention: Promote Injury-Free Environment  Recent Flowsheet Documentation  Taken 03/24/2022 2000 by Garlon Hatchet, RN  Safety Interventions:   commode/urinal/bedpan at bedside   fall reduction program maintained   lighting adjusted for tasks/safety   low bed   nonskid shoes/slippers when out of bed     Problem: Self-Care Deficit  Goal: Improved Ability to Complete Activities of Daily Living  Outcome: Progressing     Problem: Comorbidity Management  Goal: Blood Glucose Levels Within Targeted Range  Outcome: Progressing

## 2022-03-25 NOTE — Unmapped (Cosign Needed)
CARDIAC SURGERY PROGRESS NOTE    SUBJECTIVE:  There were no acute events overnight. Patient states that his pain is well controlled with current regimen. He is doing well overall.     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)    Opioid dependence with current use (CMS-HCC)    TIA (transient ischemic attack)    Accelerating angina (CMS-HCC)      MEDICATIONS:  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    Corneal abrasion 1975    steel in right eye with abrasion and removal    Cubital tunnel syndrome on left 01/16/2019    Depression 2016    Diabetes mellitus (CMS-HCC)     GERD (gastroesophageal reflux disease) 1982    Hypertension     Meningitis 12/25/2015    Mixed hyperlipidemia     Myocardial infarction (CMS-HCC) 1986    Obesity 1986    first back surgery    Peptic ulceration        Scheduled Meds:   acetaminophen  1,000 mg Oral QID    aspirin  81 mg Oral Daily    clopidogreL  75 mg Oral Daily    docusate sodium  100 mg Oral BID    gabapentin  300 mg Oral BID    heparin (porcine) for subcutaneous use  5,000 Units Subcutaneous Q8H SCH    insulin lispro  0-9 Units Subcutaneous ACHS    insulin lispro  6 Units Subcutaneous 3xd Meals    insulin NPH  7 Units Subcutaneous Q12H SCH    lidocaine  3 patch Transdermal Daily    magnesium oxide  400 mg Oral BID    melatonin  6 mg Oral QPM    metoPROLOL tartrate  12.5 mg Oral Q8H SCH    polyethylene glycol  17 g Oral 3xd Meals    potassium chloride  10 mEq Oral Daily    pravastatin  40 mg Oral Daily    sertraline  100 mg Oral Daily    tamsulosin  0.4 mg Oral Nightly    traZODone  150 mg Oral Nightly       Continuous Infusions:    PRN Meds:bisacodyL, dextrose in water, dextrose in water, glucagon, glucose, ipratropium-albuteroL, ondansetron, oxyCODONE, oxyCODONE    Allergies:   Allergies   Allergen Reactions    Morphine Hives    Atorvastatin      neuropathy    Atorvastatin Calcium      neuropathy    Baclofen      headache  headache    Ibuprofen      GI bleed    Pneumococcal 23-Valent Polysaccharide Vaccine Other (See Comments)     Redness and  pain at injection site. Fatigue  Pt stated his arm swelled within 24hrs after injection    Zetia [Ezetimibe] Other (See Comments)     Pt states it effected his brain  Pt stated it effected his sight, I couldn't drive a car at all       OBJECTIVE:    Vital signs in last 24 hours:  Temp:  [36.7 ??C (98 ??F)-37.2 ??C (99 ??F)] 36.7 ??C (98.1 ??F)  Heart Rate:  [78-97] 88  SpO2 Pulse:  [78-91] 91  Resp:  [17-27] 21  BP: (114-160)/(39-82) 158/82  MAP (mmHg):  [57-107] 107  SpO2:  [92 %-95 %] 92 %    Vitals:    03/23/22 1958 03/25/22 0620   Weight: 88.9 kg (195 lb 15.8 oz) 89 kg (196 lb 3.4 oz)       Intake/Output last 24 hours:  I/O last 3 completed shifts:  In: 120 [P.O.:120]  Out: 3328 [Urine:3175; Chest Tube:153]    Physical Exam:  General: No acute distress. Interactive during exam.  Cardiovascular: normal rate and regular rhythm. No murmurs, rubs or gallops. x1 mediastinal chest tube remains.  Pulmonary: CTA B, Decreased breath sounds in bases. No wheezes, rhonchi, or rubs. Abdominal/Gastrointestinal: Soft, non-tender, non-distended, positive bowel sounds throughout.  Musculoskeletal: Extremities warm, dry, well perfused.  negative for lower extremity edema.  Neuro: A+O x 3.  No focal neurologic deficits noted.  Skin: Warm and dry. Surgical incision clean, dry and intact.   Sternum: stable to palpation.  No signs of infection.          Data Review:  Recent Labs   Lab Units 03/25/22  0457   SODIUM mmol/L 142   POTASSIUM mmol/L 3.9   CHLORIDE mmol/L 105   CO2 mmol/L 33.0*   BUN mg/dL 17   CREATININE mg/dL 1.61   CALCIUM mg/dL 9.0   MAGNESIUM mg/dL 1.8   PHOSPHORUS mg/dL 3.0        Lab Results   Component Value Date    BILITOT 0.6 03/20/2022    BILITOT 0.4 03/01/2022    BILIDIR 0.20 03/20/2022    ALT 12 03/20/2022    ALT 14 03/01/2022    AST 31 03/20/2022    AST 21 03/01/2022    ALKPHOS 41 (L) 03/20/2022    ALKPHOS 61 03/01/2022    PROT 5.8 03/20/2022    PROT 7.7 03/01/2022    ALBUMIN 3.5 03/20/2022    ALBUMIN 4.1 03/01/2022        Recent Labs   Lab Units 03/25/22  0456   HEMOGLOBIN g/dL 9.3*   HEMATOCRIT % 09.6*   PLATELET COUNT (1) 10*9/L 187       No results in the last day    Invalid input(s): PTPATIENT   Recent Labs   Lab Units 03/25/22  1208 03/25/22  0738 03/24/22  2133 03/24/22  1628   POC GLUCOSE mg/dL 045 409 811* 914         Diet:   Active Orders   Diet    Nutrition Therapy Regular/House; Low Sugar       Imaging:  CXR 03/23/2022:  Bibasilar subsegmental atelectasis, improved since prior. No new consolidation.     Hospital Course:  11/27 CABG x2 (LIMA to LAD, RSVG to OM), no TEE due to esophageal stricture     ASSESSMENT:     Christopher [name] is a 71 y.o. male w/ angina, CAD (LAD and OM), HLD, T2DM (HgbA1C 7.9), HTN, statin intolerance, TIA.  PLAN:    Cardiovascular: postoperative state from CABG x2   - No TEE done 2/2 esophageal stricture. Normal EF on TTE   - VSS, NSR: continue metop  - continue ASA + Plavix and statin   - x1 mediastinal chest tube remains intact to waterseal     Respiratory:  - on 2L [place] with good O2 saturation - wean as tolerated  - CXR results as stated above  - continue Pulmonary toilet with nebs, ambulation, and incentive spirometer 10x/hour    Neuro:  - no issues, oriented x4  - pain management: scheduled tylenol, gaba, lido patches, prn oxy   - home Zoloft  - continue trazadone and melatonin at bedtime     Renal:  - Cr stable    - diuresis: lasix 20mg  po daily  - continue strict i/o   - continue Flomax   - replace electrolytes prn    FEN/GI:  - diet: regular: tolerating with good PO intake   - continue bowel regimen with Miralax, Colace, movantik   - needs post op BM - Dulcolax ordered    Endocrine:  - h/o T2DM with pre-op A1C of 7.0 %   - endocrinology following, appreciate recs:   - NPH 10 units Q12H  - Lispro 9 units TIDAC  - Lispro 1:30 > 140 ACHS  - Hypoglycemia protocol.  - POCT-BG achs.  - Ensure patient is on glucose precautions if patient taking nutrition by mouth    ID:  - afebrile, wbc wnl  - perioperative antibiotics completed  - cultures: none     Heme:  - acute post op blood loss anemia: H/H stable    - monitor daily cbc and transfuse as clinically indicated   - monitor daily chest tube output   - sq heparin tid for dvt prophylaxis     Dispo:  Full code, stepdown status  Barriers to DC: chest tube, mobility

## 2022-03-26 LAB — CBC
HEMATOCRIT: 28.7 % — ABNORMAL LOW (ref 39.0–48.0)
HEMOGLOBIN: 9.7 g/dL — ABNORMAL LOW (ref 12.9–16.5)
MEAN CORPUSCULAR HEMOGLOBIN CONC: 33.9 g/dL (ref 32.0–36.0)
MEAN CORPUSCULAR HEMOGLOBIN: 31.3 pg (ref 25.9–32.4)
MEAN CORPUSCULAR VOLUME: 92.4 fL (ref 77.6–95.7)
MEAN PLATELET VOLUME: 7.1 fL (ref 6.8–10.7)
PLATELET COUNT: 193 10*9/L (ref 150–450)
RED BLOOD CELL COUNT: 3.1 10*12/L — ABNORMAL LOW (ref 4.26–5.60)
RED CELL DISTRIBUTION WIDTH: 13.6 % (ref 12.2–15.2)
WBC ADJUSTED: 5.6 10*9/L (ref 3.6–11.2)

## 2022-03-26 LAB — BASIC METABOLIC PANEL
ANION GAP: 6 mmol/L (ref 5–14)
BLOOD UREA NITROGEN: 19 mg/dL (ref 9–23)
BUN / CREAT RATIO: 21
CALCIUM: 9 mg/dL (ref 8.7–10.4)
CHLORIDE: 106 mmol/L (ref 98–107)
CO2: 29 mmol/L (ref 20.0–31.0)
CREATININE: 0.89 mg/dL
EGFR CKD-EPI (2021) MALE: 90 mL/min/{1.73_m2} (ref >=60)
GLUCOSE RANDOM: 116 mg/dL (ref 70–179)
POTASSIUM: 4.1 mmol/L (ref 3.4–4.8)
SODIUM: 141 mmol/L (ref 135–145)

## 2022-03-26 LAB — MAGNESIUM: MAGNESIUM: 1.7 mg/dL (ref 1.6–2.6)

## 2022-03-26 LAB — PHOSPHORUS: PHOSPHORUS: 4.1 mg/dL (ref 2.4–5.1)

## 2022-03-26 MED ADMIN — gabapentin (NEURONTIN) capsule 300 mg: 300 mg | ORAL | @ 14:00:00

## 2022-03-26 MED ADMIN — acetaminophen (TYLENOL) tablet 1,000 mg: 1000 mg | ORAL | @ 23:00:00

## 2022-03-26 MED ADMIN — heparin (porcine) 5,000 unit/mL injection 5,000 Units: 5000 [IU] | SUBCUTANEOUS | @ 10:00:00

## 2022-03-26 MED ADMIN — traZODone (DESYREL) tablet 150 mg: 150 mg | ORAL | @ 02:00:00

## 2022-03-26 MED ADMIN — metoPROLOL tartrate (LOPRESSOR) tablet 12.5 mg: 12.5 mg | ORAL | @ 10:00:00

## 2022-03-26 MED ADMIN — insulin NPH (HumuLIN,NovoLIN) injection 7 Units: 7 [IU] | SUBCUTANEOUS | @ 14:00:00

## 2022-03-26 MED ADMIN — metoPROLOL tartrate (LOPRESSOR) tablet 12.5 mg: 12.5 mg | ORAL | @ 02:00:00

## 2022-03-26 MED ADMIN — docusate sodium (COLACE) capsule 100 mg: 100 mg | ORAL | @ 02:00:00

## 2022-03-26 MED ADMIN — insulin lispro (HumaLOG) injection 6 Units: 6 [IU] | SUBCUTANEOUS | @ 18:00:00

## 2022-03-26 MED ADMIN — metoPROLOL tartrate (LOPRESSOR) tablet 12.5 mg: 12.5 mg | ORAL | @ 19:00:00

## 2022-03-26 MED ADMIN — insulin lispro (HumaLOG) injection 6 Units: 6 [IU] | SUBCUTANEOUS | @ 14:00:00

## 2022-03-26 MED ADMIN — insulin lispro (HumaLOG) injection 6 Units: 6 [IU] | SUBCUTANEOUS

## 2022-03-26 MED ADMIN — heparin (porcine) 5,000 unit/mL injection 5,000 Units: 5000 [IU] | SUBCUTANEOUS | @ 19:00:00

## 2022-03-26 MED ADMIN — melatonin tablet 6 mg: 6 mg | ORAL | @ 02:00:00

## 2022-03-26 MED ADMIN — tamsulosin (FLOMAX) 24 hr capsule 0.4 mg: .4 mg | ORAL | @ 02:00:00

## 2022-03-26 MED ADMIN — acetaminophen (TYLENOL) tablet 1,000 mg: 1000 mg | ORAL | @ 10:00:00

## 2022-03-26 MED ADMIN — magnesium oxide (MAG-OX) tablet 400 mg: 400 mg | ORAL | @ 14:00:00

## 2022-03-26 MED ADMIN — furosemide (LASIX) tablet 20 mg: 20 mg | ORAL | @ 14:00:00

## 2022-03-26 MED ADMIN — potassium chloride ER tablet 10 mEq: 10 meq | ORAL | @ 14:00:00

## 2022-03-26 MED ADMIN — insulin lispro (HumaLOG) injection 0-9 Units: 0-9 [IU] | SUBCUTANEOUS | @ 02:00:00

## 2022-03-26 MED ADMIN — magnesium oxide (MAG-OX) tablet 400 mg: 400 mg | ORAL | @ 02:00:00

## 2022-03-26 MED ADMIN — sertraline (ZOLOFT) tablet 100 mg: 100 mg | ORAL | @ 14:00:00

## 2022-03-26 MED ADMIN — heparin (porcine) 5,000 unit/mL injection 5,000 Units: 5000 [IU] | SUBCUTANEOUS | @ 02:00:00

## 2022-03-26 MED ADMIN — pravastatin (PRAVACHOL) tablet 40 mg: 40 mg | ORAL | @ 14:00:00

## 2022-03-26 MED ADMIN — clopidogreL (PLAVIX) tablet 75 mg: 75 mg | ORAL | @ 14:00:00

## 2022-03-26 MED ADMIN — insulin lispro (HumaLOG) injection 0-9 Units: 0-9 [IU] | SUBCUTANEOUS | @ 23:00:00

## 2022-03-26 MED ADMIN — acetaminophen (TYLENOL) tablet 1,000 mg: 1000 mg | ORAL | @ 18:00:00

## 2022-03-26 MED ADMIN — aspirin chewable tablet 81 mg: 81 mg | ORAL | @ 14:00:00

## 2022-03-26 MED ADMIN — acetaminophen (TYLENOL) tablet 1,000 mg: 1000 mg | ORAL | @ 02:00:00

## 2022-03-26 MED ADMIN — gabapentin (NEURONTIN) capsule 300 mg: 300 mg | ORAL | @ 02:00:00

## 2022-03-26 MED ADMIN — insulin lispro (HumaLOG) injection 0-9 Units: 0-9 [IU] | SUBCUTANEOUS | @ 18:00:00

## 2022-03-26 MED ADMIN — insulin NPH (HumuLIN,NovoLIN) injection 7 Units: 7 [IU] | SUBCUTANEOUS | @ 02:00:00

## 2022-03-26 NOTE — Unmapped (Signed)
A&O x4, VSS, NSR per tele. CT to water seal, minimal output this shift. Oxygen maintained at 1L. Adequate UOP. 2 BM this shift. Ambulated pt around unit. Pt in bed at this time.     Problem: Adult Inpatient Plan of Care  Goal: Absence of Hospital-Acquired Illness or Injury  Intervention: Identify and Manage Fall Risk  Recent Flowsheet Documentation  Taken 03/25/2022 0800 by Graciella Belton, RN  Safety Interventions:   environmental modification   fall reduction program maintained   low bed  Intervention: Prevent Skin Injury  Recent Flowsheet Documentation  Taken 03/25/2022 1600 by Graciella Belton, RN  Positioning for Skin: Sitting in Chair  Taken 03/25/2022 1400 by Graciella Belton, RN  Positioning for Skin: Sitting in Chair  Taken 03/25/2022 1200 by Graciella Belton, RN  Positioning for Skin: Sitting in Chair  Taken 03/25/2022 1000 by Graciella Belton, RN  Positioning for Skin: Sitting in Chair  Taken 03/25/2022 0800 by Graciella Belton, RN  Positioning for Skin: Supine/Back  Device Skin Pressure Protection: absorbent pad utilized/changed  Skin Protection: adhesive use limited  Intervention: Prevent Infection  Recent Flowsheet Documentation  Taken 03/25/2022 0800 by Graciella Belton, RN  Infection Prevention:   cohorting utilized   environmental surveillance performed     Problem: Wound  Goal: Absence of Infection Signs and Symptoms  Intervention: Prevent or Manage Infection  Recent Flowsheet Documentation  Taken 03/25/2022 0800 by Graciella Belton, RN  Infection Management: aseptic technique maintained  Goal: Skin Health and Integrity  Intervention: Optimize Skin Protection  Recent Flowsheet Documentation  Taken 03/25/2022 0800 by Graciella Belton, RN  Pressure Reduction Techniques: frequent weight shift encouraged  Skin Protection: adhesive use limited     Problem: Skin Injury Risk Increased  Goal: Skin Health and Integrity  Intervention: Optimize Skin Protection  Recent Flowsheet Documentation  Taken 03/25/2022 0800 by Graciella Belton, RN  Pressure Reduction Techniques: frequent weight shift encouraged  Skin Protection: adhesive use limited     Problem: Fall Injury Risk  Goal: Absence of Fall and Fall-Related Injury  Intervention: Promote Injury-Free Environment  Recent Flowsheet Documentation  Taken 03/25/2022 0800 by Graciella Belton, RN  Safety Interventions:   environmental modification   fall reduction program maintained   low bed     Problem: Mechanical Ventilation Invasive  Goal: Absence of Device-Related Skin and Tissue Injury  Intervention: Maintain Skin and Tissue Health  Recent Flowsheet Documentation  Taken 03/25/2022 0800 by Graciella Belton, RN  Device Skin Pressure Protection: absorbent pad utilized/changed

## 2022-03-26 NOTE — Unmapped (Signed)
CARDIAC SURGERY PROGRESS NOTE    SUBJECTIVE:  There were no acute events overnight. Ready to get tube out and go home.    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)    Opioid dependence with current use (CMS-HCC)    TIA (transient ischemic attack)    Accelerating angina (CMS-HCC)      MEDICATIONS:  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    Corneal abrasion 1975    steel in right eye with abrasion and removal    Cubital tunnel syndrome on left 01/16/2019    Depression 2016    Diabetes mellitus (CMS-HCC)     GERD (gastroesophageal reflux disease) 1982    Hypertension     Meningitis 12/25/2015    Mixed hyperlipidemia     Myocardial infarction (CMS-HCC) 1986    Obesity 1986    first back surgery    Peptic ulceration        Scheduled Meds:   acetaminophen  1,000 mg Oral QID    aspirin  81 mg Oral Daily    clopidogreL  75 mg Oral Daily    docusate sodium  100 mg Oral BID    furosemide  20 mg Oral Daily    gabapentin  300 mg Oral BID    heparin (porcine) for subcutaneous use  5,000 Units Subcutaneous Q8H SCH    insulin lispro  0-9 Units Subcutaneous ACHS    insulin lispro  6 Units Subcutaneous 3xd Meals    insulin NPH  7 Units Subcutaneous Q12H SCH    lidocaine  3 patch Transdermal Daily    magnesium oxide  400 mg Oral BID    melatonin  6 mg Oral QPM metoPROLOL tartrate  12.5 mg Oral Q8H SCH    polyethylene glycol  17 g Oral 3xd Meals    potassium chloride  10 mEq Oral Daily    pravastatin  40 mg Oral Daily    sertraline  100 mg Oral Daily    tamsulosin  0.4 mg Oral Nightly    traZODone  150 mg Oral Nightly       Continuous Infusions:    PRN Meds:bisacodyL, dextrose in water, dextrose in water, glucagon, glucose, ipratropium-albuteroL, ondansetron, oxyCODONE, oxyCODONE    Allergies:   Allergies   Allergen Reactions    Morphine Hives    Atorvastatin      neuropathy    Atorvastatin Calcium      neuropathy    Baclofen      headache  headache    Ibuprofen      GI bleed    Pneumococcal 23-Valent Polysaccharide Vaccine Other (See Comments)     Redness and pain at injection  site. Fatigue  Pt stated his arm swelled within 24hrs after injection    Zetia [Ezetimibe] Other (See Comments)     Pt states it effected his brain  Pt stated it effected his sight, I couldn't drive a car at all       OBJECTIVE:    Vital signs in last 24 hours:  Temp:  [36.7 ??C (98 ??F)-37 ??C (98.6 ??F)] 37 ??C (98.6 ??F)  Heart Rate:  [78-93] 85  SpO2 Pulse:  [78-93] 81  Resp:  [15-27] 19  BP: (120-158)/(64-89) 144/89  MAP (mmHg):  [80-107] 107  SpO2:  [92 %-96 %] 94 %    Vitals:    03/23/22 1958 03/25/22 0620   Weight: 88.9 kg (195 lb 15.8 oz) 89 kg (196 lb 3.4 oz)       Intake/Output last 24 hours:  I/O last 3 completed shifts:  In: 120 [P.O.:120]  Out: 2410 [Urine:2270; Chest Tube:140]    Physical Exam:  General: No acute distress. Interactive during exam.  Cardiovascular: normal rate and regular rhythm. No murmurs, rubs or gallops. x1 mediastinal chest tube remains.  Pulmonary: CTA B, Decreased breath sounds in bases. No wheezes, rhonchi, or rubs. Abdominal/Gastrointestinal: Soft, non-tender, non-distended, positive bowel sounds throughout.  Musculoskeletal: Extremities warm, dry, well perfused.  negative for lower extremity edema.  Neuro: A+O x 3.  No focal neurologic deficits noted.  Skin: Warm and dry. Surgical incision clean, dry and intact.   Sternum: stable to palpation.  No signs of infection.          Data Review:  Recent Labs   Lab Units 03/26/22  0601   SODIUM mmol/L 141   POTASSIUM mmol/L 4.1   CHLORIDE mmol/L 106   CO2 mmol/L 29.0   BUN mg/dL 19   CREATININE mg/dL 1.61   CALCIUM mg/dL 9.0   MAGNESIUM mg/dL 1.7   PHOSPHORUS mg/dL 4.1        Lab Results   Component Value Date    BILITOT 0.6 03/20/2022    BILITOT 0.4 03/01/2022    BILIDIR 0.20 03/20/2022    ALT 12 03/20/2022    ALT 14 03/01/2022    AST 31 03/20/2022    AST 21 03/01/2022    ALKPHOS 41 (L) 03/20/2022    ALKPHOS 61 03/01/2022    PROT 5.8 03/20/2022    PROT 7.7 03/01/2022    ALBUMIN 3.5 03/20/2022    ALBUMIN 4.1 03/01/2022        Recent Labs   Lab Units 03/26/22  0601   HEMOGLOBIN g/dL 9.7*   HEMATOCRIT % 09.6*   PLATELET COUNT (1) 10*9/L 193       No results in the last day    Invalid input(s): PTPATIENT   Recent Labs   Lab Units 03/26/22  0729 03/25/22  2101 03/25/22  1621 03/25/22  1208   POC GLUCOSE mg/dL 045 [phone]         Diet:   Active Orders   Diet    Nutrition Therapy Regular/House; Low Sugar       Imaging:  CXR 03/23/2022:  Bibasilar subsegmental atelectasis, improved since prior. No new consolidation.     Hospital Course:  11/27 CABG x2 (LIMA to LAD, RSVG to OM), no TEE due to esophageal stricture     ASSESSMENT:     Christopher [name] is a 71 y.o. male w/ angina, CAD (LAD and OM), HLD, T2DM (HgbA1C 7.9), HTN, statin intolerance, TIA.    PLAN:  Cardiovascular: postoperative state from CABG x2   - No TEE done 2/2 esophageal stricture. Normal EF on TTE   - VSS, NSR: continue metop  - continue ASA + Plavix and statin   - x1 mediastinal chest tube remains intact to waterseal     Respiratory:  - on 2L [place] with good O2 saturation - wean as tolerated  - CXR results as stated above  - continue Pulmonary toilet with nebs, ambulation, and incentive spirometer 10x/hour    Neuro:  - no issues, oriented x4  - pain management: scheduled tylenol, gaba, lido patches, prn oxy   - home Zoloft  - continue trazadone and melatonin at bedtime     Renal:  - Cr stable    - diuresis: lasix 20mg  po daily  - continue strict i/o   - continue Flomax   - replace electrolytes prn    FEN/GI:  - diet: regular: tolerating with good PO intake   - continue bowel regimen with Miralax, Colace, movantik   - needs post op BM - Dulcolax ordered    Endocrine:  - h/o T2DM with pre-op A1C of 7.0 %   - endocrinology following, appreciate recs:   - NPH 10 units Q12H  - Lispro 9 units TIDAC  - Lispro 1:30 > 140 ACHS  - Hypoglycemia protocol.  - POCT-BG achs.  - Ensure patient is on glucose precautions if patient taking nutrition by mouth    ID:  - afebrile, wbc wnl  - perioperative antibiotics completed  - cultures: none     Heme:  - acute post op blood loss anemia: H/H stable    - monitor daily cbc and transfuse as clinically indicated   - monitor daily chest tube output   - sq heparin tid for dvt prophylaxis     Dispo:  Full code, stepdown status  Barriers to DC: chest tube- once removed patient ready for discharge

## 2022-03-27 DIAGNOSIS — F32A Depression, unspecified depression type: Principal | ICD-10-CM

## 2022-03-27 LAB — CBC
HEMATOCRIT: 27.6 % — ABNORMAL LOW (ref 39.0–48.0)
HEMOGLOBIN: 9.4 g/dL — ABNORMAL LOW (ref 12.9–16.5)
MEAN CORPUSCULAR HEMOGLOBIN CONC: 33.9 g/dL (ref 32.0–36.0)
MEAN CORPUSCULAR HEMOGLOBIN: 31.2 pg (ref 25.9–32.4)
MEAN CORPUSCULAR VOLUME: 92 fL (ref 77.6–95.7)
MEAN PLATELET VOLUME: 7 fL (ref 6.8–10.7)
PLATELET COUNT: 212 10*9/L (ref 150–450)
RED BLOOD CELL COUNT: 3 10*12/L — ABNORMAL LOW (ref 4.26–5.60)
RED CELL DISTRIBUTION WIDTH: 13.4 % (ref 12.2–15.2)
WBC ADJUSTED: 5.5 10*9/L (ref 3.6–11.2)

## 2022-03-27 LAB — BASIC METABOLIC PANEL
ANION GAP: 5 mmol/L (ref 5–14)
BLOOD UREA NITROGEN: 19 mg/dL (ref 9–23)
BUN / CREAT RATIO: 19
CALCIUM: 9.6 mg/dL (ref 8.7–10.4)
CHLORIDE: 104 mmol/L (ref 98–107)
CO2: 30 mmol/L (ref 20.0–31.0)
CREATININE: 1 mg/dL
EGFR CKD-EPI (2021) MALE: 79 mL/min/{1.73_m2} (ref >=60)
GLUCOSE RANDOM: 125 mg/dL (ref 70–179)
POTASSIUM: 4.2 mmol/L (ref 3.4–4.8)
SODIUM: 139 mmol/L (ref 135–145)

## 2022-03-27 LAB — MAGNESIUM: MAGNESIUM: 1.9 mg/dL (ref 1.6–2.6)

## 2022-03-27 LAB — PHOSPHORUS: PHOSPHORUS: 4.5 mg/dL (ref 2.4–5.1)

## 2022-03-27 MED ORDER — OZEMPIC 0.25 MG OR 0.5 MG (2 MG/3 ML) SUBCUTANEOUS PEN INJECTOR
SUBCUTANEOUS | 0 refills | 56 days | Status: CP
Start: 2022-03-27 — End: 2022-05-22
  Filled 2022-03-27: qty 6, 56d supply, fill #0

## 2022-03-27 MED ORDER — OXYCODONE 5 MG TABLET
ORAL_TABLET | Freq: Four times a day (QID) | ORAL | 0 refills | 7 days | Status: CP | PRN
Start: 2022-03-27 — End: 2022-04-03
  Filled 2022-03-27: qty 28, 7d supply, fill #0

## 2022-03-27 MED ORDER — METOPROLOL TARTRATE 25 MG TABLET
ORAL_TABLET | Freq: Two times a day (BID) | ORAL | 0 refills | 30 days | Status: CP
Start: 2022-03-27 — End: 2022-04-26
  Filled 2022-03-27: qty 30, 30d supply, fill #0

## 2022-03-27 MED ORDER — DOCUSATE SODIUM 100 MG CAPSULE
ORAL_CAPSULE | Freq: Two times a day (BID) | ORAL | 0 refills | 30 days | Status: CP
Start: 2022-03-27 — End: ?
  Filled 2022-03-27: qty 60, 30d supply, fill #0

## 2022-03-27 MED ORDER — BLOOD-GLUCOSE METER
0 refills | 0 days | Status: CP
Start: 2022-03-27 — End: ?

## 2022-03-27 MED ORDER — TRAZODONE 150 MG TABLET
ORAL_TABLET | Freq: Every evening | ORAL | 2 refills | 90 days | Status: CP
Start: 2022-03-27 — End: ?

## 2022-03-27 MED ORDER — SERTRALINE 100 MG TABLET
ORAL_TABLET | Freq: Every day | ORAL | 2 refills | 90 days | Status: CP
Start: 2022-03-27 — End: ?

## 2022-03-27 MED ORDER — LANCETS
0 refills | 0 days | Status: CP
Start: 2022-03-27 — End: ?
  Filled 2022-03-27: qty 100, 50d supply, fill #0

## 2022-03-27 MED ORDER — BLOOD GLUCOSE TEST STRIPS
ORAL_STRIP | 0 refills | 0 days | Status: CP
Start: 2022-03-27 — End: ?
  Filled 2022-03-27: qty 1, 30d supply, fill #0

## 2022-03-27 MED ADMIN — aspirin chewable tablet 81 mg: 81 mg | ORAL | @ 15:00:00 | Stop: 2022-03-27

## 2022-03-27 MED ADMIN — metoPROLOL tartrate (LOPRESSOR) tablet 12.5 mg: 12.5 mg | ORAL | @ 10:00:00 | Stop: 2022-03-27

## 2022-03-27 MED ADMIN — heparin (porcine) 5,000 unit/mL injection 5,000 Units: 5000 [IU] | SUBCUTANEOUS | @ 02:00:00

## 2022-03-27 MED ADMIN — insulin lispro (HumaLOG) injection 5 Units: 5 [IU] | SUBCUTANEOUS | @ 15:00:00 | Stop: 2022-03-27

## 2022-03-27 MED ADMIN — potassium chloride ER tablet 10 mEq: 10 meq | ORAL | @ 15:00:00 | Stop: 2022-03-27

## 2022-03-27 MED ADMIN — tamsulosin (FLOMAX) 24 hr capsule 0.4 mg: .4 mg | ORAL | @ 01:00:00

## 2022-03-27 MED ADMIN — docusate sodium (COLACE) capsule 100 mg: 100 mg | ORAL | @ 15:00:00 | Stop: 2022-03-27

## 2022-03-27 MED ADMIN — traZODone (DESYREL) tablet 150 mg: 150 mg | ORAL | @ 01:00:00

## 2022-03-27 MED ADMIN — gabapentin (NEURONTIN) capsule 300 mg: 300 mg | ORAL | @ 01:00:00

## 2022-03-27 MED ADMIN — metoPROLOL tartrate (LOPRESSOR) tablet 12.5 mg: 12.5 mg | ORAL | @ 19:00:00 | Stop: 2022-03-27

## 2022-03-27 MED ADMIN — insulin NPH (HumuLIN,NovoLIN) injection 6 Units: 6 [IU] | SUBCUTANEOUS | @ 15:00:00 | Stop: 2022-03-27

## 2022-03-27 MED ADMIN — furosemide (LASIX) tablet 20 mg: 20 mg | ORAL | @ 15:00:00 | Stop: 2022-03-27

## 2022-03-27 MED ADMIN — clopidogreL (PLAVIX) tablet 75 mg: 75 mg | ORAL | @ 15:00:00 | Stop: 2022-03-27

## 2022-03-27 MED ADMIN — insulin NPH (HumuLIN,NovoLIN) injection 7 Units: 7 [IU] | SUBCUTANEOUS | @ 02:00:00

## 2022-03-27 MED ADMIN — heparin (porcine) 5,000 unit/mL injection 5,000 Units: 5000 [IU] | SUBCUTANEOUS | @ 10:00:00 | Stop: 2022-03-27

## 2022-03-27 MED ADMIN — pravastatin (PRAVACHOL) tablet 40 mg: 40 mg | ORAL | @ 15:00:00 | Stop: 2022-03-27

## 2022-03-27 MED ADMIN — magnesium oxide (MAG-OX) tablet 400 mg: 400 mg | ORAL | @ 15:00:00 | Stop: 2022-03-27

## 2022-03-27 MED ADMIN — insulin lispro (HumaLOG) injection 0-9 Units: 0-9 [IU] | SUBCUTANEOUS | @ 02:00:00

## 2022-03-27 MED ADMIN — insulin lispro (HumaLOG) injection 0-9 Units: 0-9 [IU] | SUBCUTANEOUS | @ 15:00:00 | Stop: 2022-03-27

## 2022-03-27 MED ADMIN — metoPROLOL tartrate (LOPRESSOR) tablet 12.5 mg: 12.5 mg | ORAL | @ 02:00:00

## 2022-03-27 MED ADMIN — acetaminophen (TYLENOL) tablet 1,000 mg: 1000 mg | ORAL | @ 18:00:00 | Stop: 2022-03-27

## 2022-03-27 MED ADMIN — gabapentin (NEURONTIN) capsule 300 mg: 300 mg | ORAL | @ 15:00:00 | Stop: 2022-03-27

## 2022-03-27 MED ADMIN — sertraline (ZOLOFT) tablet 100 mg: 100 mg | ORAL | @ 15:00:00 | Stop: 2022-03-27

## 2022-03-27 MED ADMIN — lidocaine 4 % patch 3 patch: 3 | TRANSDERMAL | @ 15:00:00 | Stop: 2022-03-27

## 2022-03-27 MED ADMIN — melatonin tablet 6 mg: 6 mg | ORAL | @ 01:00:00

## 2022-03-27 MED ADMIN — magnesium oxide (MAG-OX) tablet 400 mg: 400 mg | ORAL | @ 01:00:00

## 2022-03-27 MED ADMIN — acetaminophen (TYLENOL) tablet 1,000 mg: 1000 mg | ORAL | @ 10:00:00 | Stop: 2022-03-27

## 2022-03-27 MED FILL — ACCU-CHEK GUIDE TEST STRIPS: 50 days supply | Qty: 100 | Fill #0

## 2022-03-27 NOTE — Unmapped (Signed)
A&O x4, VSS, NSR per tele. CT to water seal, minimal output this shift. Pt on RA- 1L [place]. Adequate UOP. Bath performed. 1 BM this shift. Ambulated pt around unit. Pt in bed at this time.     Problem: Adult Inpatient Plan of Care  Goal: Absence of Hospital-Acquired Illness or Injury  Intervention: Identify and Manage Fall Risk  Recent Flowsheet Documentation  Taken 03/26/2022 0800 by Graciella Belton, RN  Safety Interventions:   environmental modification   lighting adjusted for tasks/safety   low bed  Intervention: Prevent Skin Injury  Recent Flowsheet Documentation  Taken 03/26/2022 1600 by Graciella Belton, RN  Positioning for Skin: Sitting in Chair  Taken 03/26/2022 1400 by Graciella Belton, RN  Positioning for Skin: Sitting in Chair  Taken 03/26/2022 1200 by Graciella Belton, RN  Positioning for Skin: Sitting in Chair  Taken 03/26/2022 1000 by Graciella Belton, RN  Positioning for Skin: Sitting in Chair  Taken 03/26/2022 0800 by Graciella Belton, RN  Positioning for Skin: Sitting in Chair  Device Skin Pressure Protection: absorbent pad utilized/changed  Skin Protection: incontinence pads utilized  Intervention: Prevent Infection  Recent Flowsheet Documentation  Taken 03/26/2022 0800 by Graciella Belton, RN  Infection Prevention: cohorting utilized     Problem: Wound  Goal: Optimal Functional Ability  Intervention: Optimize Functional Ability  Recent Flowsheet Documentation  Taken 03/26/2022 0800 by Graciella Belton, RN  Activity Management: up in chair  Goal: Absence of Infection Signs and Symptoms  Intervention: Prevent or Manage Infection  Recent Flowsheet Documentation  Taken 03/26/2022 0800 by Graciella Belton, RN  Infection Management: aseptic technique maintained  Goal: Skin Health and Integrity  Intervention: Optimize Skin Protection  Recent Flowsheet Documentation  Taken 03/26/2022 0800 by Graciella Belton, RN  Activity Management: up in chair  Pressure Reduction Techniques: frequent weight shift encouraged  Pressure Reduction Devices: pressure-redistributing mattress utilized  Skin Protection: incontinence pads utilized     Problem: Skin Injury Risk Increased  Goal: Skin Health and Integrity  Intervention: Optimize Skin Protection  Recent Flowsheet Documentation  Taken 03/26/2022 0800 by Graciella Belton, RN  Activity Management: up in chair  Pressure Reduction Techniques: frequent weight shift encouraged  Pressure Reduction Devices: pressure-redistributing mattress utilized  Skin Protection: incontinence pads utilized     Problem: Fall Injury Risk  Goal: Absence of Fall and Fall-Related Injury  Intervention: Promote Injury-Free Management consultant Documentation  Taken 03/26/2022 0800 by Graciella Belton, RN  Safety Interventions:   environmental modification   lighting adjusted for tasks/safety   low bed     Problem: Mechanical Ventilation Invasive  Goal: Absence of Device-Related Skin and Tissue Injury  Intervention: Maintain Skin and Tissue Health  Recent Flowsheet Documentation  Taken 03/26/2022 0800 by Graciella Belton, RN  Device Skin Pressure Protection: absorbent pad utilized/changed

## 2022-03-27 NOTE — Unmapped (Signed)
Diabetes education consultation: Consulted for assistance with providing instruction on diabetes self-management skills, specifically new to Ozempic. Visited with Christopher [name] at the bedside. Provided 30 minutes of diabetes education.     Assessment: Christopher [name] admitted with Christopher [name] of Accelerating angina, native coronary artery disease (LAD and OM), HLD, T2DM (HgbA1C 7.9), HTN, Statin intolerance, TIA who presents for evaluation for Coronary Artery Bypass Grafting. He reports taking his metformin BID and monitors his BG 4-5 times a week. He states he needs a new glucometer at discharge as his has started to have issues. He states he takes another injection at home and is comfortable with the addition of the once a week injection with Ozempic.     Insurance: Christopher [name] has Medicare HMO: [employer].      A1C:    Lab Results   Component Value Date    A1C 7.9 (H) 12/22/2021    EAG 180 12/22/2021     New medication teaching: Reviewed mechanism of action, administration, and side effects of metformin and Ozempic. Instructed on ozempic administration with pens - including injection site selection & rotation, insulin storage, sharps disposal & discard/expiration date. 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      Problem-solving:   Hypoglycemia:    Instructed on hypoglycemia recognition & treatment, including 15-15 rule, listing several examples of appropriate fast carb sources, emphasizing importance of having something readily available. Described possible causes and prevention.  Instructed to contact provider for unexplained episode, an episode requiring more than 1 treatment, or 2 episodes within a 2-3 day period, for possible dose adjustment.    Hyperglycemia:  Instructed on hyperglycemia recognition and treatment. Described possible causes and prevention and when to contact provider.     Risk Reduction- Foot Care:  Discussed principles of good foot care such as daily cleansing, and inspection, avoiding walking barefoot, having well-fitting shoes and no bathroom surgery on feet.  Provided education material Diabetes Foot Health: Care Instructions as a resource.    Being Active:  Discussed the impact of physical activity on blood sugars, encouraged to engage in physical activity after meals, examples provided.     Healthy Eating:  Discussed with patient meal planning principles for glucose control including the plate method, which foods are carbs and portion control. Reviewed avoiding sugary beverages/concentrated sweet. Reviewed dietary guidelines/ plate method/ avoiding concentrated sweets/eating at consistent times.   Meal Planning Basics  Foods that Affect and Do Not Affect Blood Glucose  Timing, Amount, and Balance in Meals  Food Labels    Monitoring Supplies needing prescriptions at discharge:  Please order glucose meter, lancets, and strips covered by insurance    Recommendations: At f/u visit with PCP request referral for DSMES (Diabetes Self Management Education and Support) after discharge.     Plan: Thank you for this consult!   Thank You,   Dyane Dustman, MSN, RN, Diabetes Nurse Educator- pager: [phone]

## 2022-03-27 NOTE — Unmapped (Signed)
Neuro: A&OX4, involved in care     Resp: Room air, is at bedside    Cardiac: NSR, VSS     GI/GU: Voiding adequately independently      Skin: Surgical incisions open to air, CT removed today     Pain: no complaints this shift     Goal: DC today, Diabetes care education, Encourage ambulation and IS usage     Discharge instructions given and education provided    Problem: Adult Inpatient Plan of Care  Goal: Plan of Care Review  Outcome: Resolved  Goal: Patient-Specific Goal (Individualized)  Outcome: Resolved  Goal: Absence of Hospital-Acquired Illness or Injury  Outcome: Resolved  Intervention: Identify and Manage Fall Risk  Recent Flowsheet Documentation  Taken 03/27/2022 0800 by Jennette Bill, RN  Safety Interventions:   aspiration precautions   bleeding precautions   commode/urinal/bedpan at bedside   environmental modification   fall reduction program maintained   infection management   lighting adjusted for tasks/safety   low bed  Intervention: Prevent Skin Injury  Recent Flowsheet Documentation  Taken 03/27/2022 1200 by Jennette Bill, RN  Positioning for Skin: Sitting in Chair  Device Skin Pressure Protection:   absorbent pad utilized/changed   adhesive use limited  Skin Protection: adhesive use limited  Taken 03/27/2022 1000 by Jennette Bill, RN  Positioning for Skin: Sitting in Chair  Device Skin Pressure Protection:   absorbent pad utilized/changed   adhesive use limited  Skin Protection: adhesive use limited  Taken 03/27/2022 0800 by Jennette Bill, RN  Positioning for Skin: Sitting in Chair  Device Skin Pressure Protection:   absorbent pad utilized/changed   adhesive use limited  Skin Protection: adhesive use limited  Intervention: Prevent and Manage VTE (Venous Thromboembolism) Risk  Recent Flowsheet Documentation  Taken 03/27/2022 1200 by Jennette Bill, RN  VTE Prevention/Management:   ambulation promoted   anticoagulant therapy   fluids promoted   bleeding risk factors identified   bleeding precautions maintained  Anti-Embolism Device Type: SCD, Knee  Anti-Embolism Intervention: (Sub Q Hep) Other (Comment)  Anti-Embolism Device Location: BLE  Taken 03/27/2022 1000 by Jennette Bill, RN  Anti-Embolism Device Type: SCD, Knee  Anti-Embolism Intervention: (Sub Q Hep) Other (Comment)  Anti-Embolism Device Location: BLE  Taken 03/27/2022 0800 by Jennette Bill, RN  VTE Prevention/Management:   ambulation promoted   anticoagulant therapy   bleeding precautions maintained   bleeding risk factors identified   fluids promoted  Anti-Embolism Device Type: SCD, Knee  Anti-Embolism Intervention: (Sub Q Hep) Other (Comment)  Anti-Embolism Device Location: BLE  Intervention: Prevent Infection  Recent Flowsheet Documentation  Taken 03/27/2022 0800 by Jennette Bill, RN  Infection Prevention: hand hygiene promoted  Goal: Optimal Comfort and Wellbeing  Outcome: Resolved  Goal: Readiness for Transition of Care  Outcome: Resolved  Goal: Rounds/Family Conference  Outcome: Resolved     Problem: Wound  Goal: Optimal Coping  Outcome: Resolved  Goal: Optimal Functional Ability  Outcome: Resolved  Goal: Absence of Infection Signs and Symptoms  Outcome: Resolved  Intervention: Prevent or Manage Infection  Recent Flowsheet Documentation  Taken 03/27/2022 0800 by Jennette Bill, RN  Infection Management: aseptic technique maintained  Goal: Improved Oral Intake  Outcome: Resolved  Goal: Optimal Pain Control and Function  Outcome: Resolved  Goal: Skin Health and Integrity  Outcome: Resolved  Intervention: Optimize Skin Protection  Recent Flowsheet Documentation  Taken 03/27/2022 1200 by Jennette Bill, RN  Pressure Reduction Techniques: frequent weight  shift encouraged  Skin Protection: adhesive use limited  Taken 03/27/2022 1000 by Jennette Bill, RN  Pressure Reduction Techniques: frequent weight shift encouraged  Skin Protection: adhesive use limited  Taken 03/27/2022 0800 by Jennette Bill, RN  Pressure Reduction Techniques: frequent weight shift encouraged  Skin Protection: adhesive use limited  Goal: Optimal Wound Healing  Outcome: Resolved     Problem: Skin Injury Risk Increased  Goal: Skin Health and Integrity  Outcome: Resolved  Intervention: Optimize Skin Protection  Recent Flowsheet Documentation  Taken 03/27/2022 1200 by Jennette Bill, RN  Pressure Reduction Techniques: frequent weight shift encouraged  Skin Protection: adhesive use limited  Taken 03/27/2022 1000 by Jennette Bill, RN  Pressure Reduction Techniques: frequent weight shift encouraged  Skin Protection: adhesive use limited  Taken 03/27/2022 0800 by Jennette Bill, RN  Pressure Reduction Techniques: frequent weight shift encouraged  Skin Protection: adhesive use limited     Problem: Fall Injury Risk  Goal: Absence of Fall and Fall-Related Injury  Outcome: Resolved  Intervention: Identify and Manage Contributors  Recent Flowsheet Documentation  Taken 03/27/2022 1200 by Jennette Bill, RN  Self-Care Promotion: independence encouraged  Taken 03/27/2022 0800 by Jennette Bill, RN  Self-Care Promotion: independence encouraged  Intervention: Promote Injury-Free Environment  Recent Flowsheet Documentation  Taken 03/27/2022 0800 by Jennette Bill, RN  Safety Interventions:   aspiration precautions   bleeding precautions   commode/urinal/bedpan at bedside   environmental modification   fall reduction program maintained   infection management   lighting adjusted for tasks/safety   low bed     Problem: Self-Care Deficit  Goal: Improved Ability to Complete Activities of Daily Living  Outcome: Resolved  Intervention: Promote Activity and Functional Independence  Recent Flowsheet Documentation  Taken 03/27/2022 1200 by Jennette Bill, RN  Self-Care Promotion: independence encouraged  Taken 03/27/2022 0800 by Jennette Bill, RN  Self-Care Promotion: independence encouraged     Problem: Comorbidity Management  Goal: Blood Glucose Levels Within Targeted Range  Outcome: Resolved  Intervention: Monitor and Manage Glycemia  Recent Flowsheet Documentation  Taken 03/27/2022 1200 by Jennette Bill, RN  Glycemic Management: blood glucose monitored  Taken 03/27/2022 1000 by Jennette Bill, RN  Glycemic Management: blood glucose monitored  Taken 03/27/2022 0800 by Jennette Bill, RN  Glycemic Management: blood glucose monitored

## 2022-03-27 NOTE — Unmapped (Signed)
Endocrine Team Diabetes Follow Up Consult Note     Consult information:  Requesting Attending Physician : Arlester Marker, MD  Service Requesting Consult : Surg Cardiac [facility])  Primary Care Provider: Amada Kingfisher, MD  Impression:  Christopher [name] is a 71 y.o. male admitted for 2v CABG (LIMA to LAD, RSVG to OM), now s/p 11/27. We have been consulted at the request of Arlester Marker, MD to evaluate Christopher [name] for hyperglycemia.     Medical Decision Making:  Diagnoses:  1.Type 2 Diabetes. Uncontrolled With hyperglycemia. Currently within inpatient BG target range.   2. Nutrition: Complicating glycemic control. Increasing risk for both hypoglycemia and hyperglycemia.  3. Coronary Artery Disease. Complicating glycemic control and increasing risk for hyperglycemia and hypoglycemia.  4. Obesity. Complicating glycemic control and increasing risk for hyperglycemia.  5. Chronic Kidney Disease. Complicating glycemic control and increasing risk for hypoglycemia.  6. Catecholamine infusion. Complicating glycemic control and increasing risk for hyperglycemia.      Studies reviewed 03/27/22:  Labs: POCT-BG and HbA1C  Interpretation: Generally well-controlled BGs, within inpatient target, ranging 125-173 mg/dL. A1C 7.9  (12/22/21) indicating fair outpatient glycemic management.  Notes reviewed: Primary team and nursing notes      Overall impression based on above reviews and history:  Pt with T2DM s/p 2vCABG 11/27. Generally well-controlled BGs within inpatient target. Plan to further decrease basal dose slightly as well as prandial coverage. Will decrease basal by 10% and base prandial coverage on meal and correctional received yesterday (this will also be about a 10% decrease as well). No further changes. Will continue to monitor.      Recommendations:  - Diabetes education to review new home regimen  - Adjust to NPH 6 units Q12H  - Adjust to Lispro 5 units TIDAC  - Lispro 1:30 > 140 ACHS  - Hypoglycemia protocol.  - POCT-BG achs.  - Ensure patient is on glucose precautions if patient taking nutrition by mouth.     Discharge planning:  In process. Will complete closer to discharge. Likely resume home regimen given current A1C. We briefly discussed starting a GLP1ra such as ozempic for dual cardiac benefit and glycemic management. He stated he has been discussing starting this with his PCP and is amenable to starting this outpatient and can follow-up with PCP once it is started. Tentative plan below:  - Resume metformin 100 mg BID AC  - START semaglutide (Ozempic) once every seven (7) days:   - Semaglutide (Ozempic) 0.25 mg for 28 days   - Semaglutdie (Ozempic) 0.50 mg for 28 days   - Semaglutide (Ozempic) 1 mg for 28 days  - Will need close follow-up outpatient with PCP, recommend appt with PCP 2-3 weeks post-discharge from hospital  - You can continue to check your BGs as you were  *Med red updated 12/1    Thank you for this consult. Discussed plan with primary team. We will continue to follow and make recommendations and place orders as appropriate.    Please page with questions or concerns: Karna Christmas, NP: [phone]  Endocrinology Diabetes Care Team on call from 6AM - 3PM on weekdays then endocrine fellow on call: [phone] from 3PM - 6AM on weekdays and on weekends and holidays.   If APP cannot be reached, please page the endocrine fellow on call.      Subjective:  Interval Encounter:  No n/v noted. No acute concerns or questions noted at time of rounds. Tolerating PO intake.  Initial HPI:  Christopher [name] is a 71 y.o. male with past medical history of accelerating angina, native coronary artery disease (LAD and OM), HLD, T2DM (HgbA1C 7.9), HTN, statin intolerance, TIA who presents for evaluation for Coronary Artery Bypass Grafting. Mr. Harvel Quale reports that he has CP/Angina every day, even at rest. However, this is worsened by exertion. He denies other associated symptoms.    He is now s/p CABGx2 (LIMA to LAD, RSVG to OM) 11/27       Diabetes History:  Patient has a history of Type 2 diabetes diagnosed about 10 years ago.  Diabetes is managed by: PCP.  Current home diabetes regimen: metformin 1000 mg BID AC.  Current home blood glucose monitoring: daily.  Hypoglycemia awareness: Aware.  Complications related to diabetes: retinopathy and chronic kidney disease      Current Nutrition:  Active Orders   Diet    Nutrition Therapy Regular/House; Low Sugar       ROS: As per HPI.     acetaminophen  1,000 mg Oral QID    aspirin  81 mg Oral Daily    clopidogreL  75 mg Oral Daily    docusate sodium  100 mg Oral BID    furosemide  20 mg Oral Daily    gabapentin  300 mg Oral BID    heparin (porcine) for subcutaneous use  5,000 Units Subcutaneous Q8H SCH    insulin lispro  0-9 Units Subcutaneous ACHS    insulin lispro  6 Units Subcutaneous 3xd Meals    insulin NPH  7 Units Subcutaneous Q12H SCH    lidocaine  3 patch Transdermal Daily    magnesium oxide  400 mg Oral BID    melatonin  6 mg Oral QPM    metoPROLOL tartrate  12.5 mg Oral Q8H SCH    polyethylene glycol  17 g Oral 3xd Meals    potassium chloride  10 mEq Oral Daily    pravastatin  40 mg Oral Daily    sertraline  100 mg Oral Daily    tamsulosin  0.4 mg Oral Nightly    traZODone  150 mg Oral Nightly       Current Outpatient Medications   Medication Instructions    alirocumab 75 mg/mL PnIj Inject the contents of 1 pen (75 mg) under the skin every fourteen (14) days.    amlodipine (NORVASC) 10 mg, Oral, Daily (standard), For high blood pressure    aspirin (ECOTRIN) 81 mg, Oral, Daily (standard)    blood sugar diagnostic (ONETOUCH ULTRA TEST) Strp USE ONCE DAILY TO CHECK FASTING BLOOD GLUCOSE    blood-glucose meter Misc 1 Device, Intradermal, Daily (standard), DX: E11.8    clopidogreL (PLAVIX) 75 mg, Oral, Daily (standard)    diclofenac sodium (VOLTAREN) 1 % gel APPLY 2 GRAMS TO AFFECTED  AREA(S) TOPICALLY 4 TIMES DAILY    diclofenac sodium (VOLTAREN) 2 g, Topical, 4 times a day    empty container (SHARPS-A-GATOR DISPOSAL SYSTEM) Misc Use as directed for sharps disposal    fluticasone propionate (FLONASE) 50 mcg/actuation nasal spray 2 sprays, Each Nare, Daily (standard)    gabapentin (NEURONTIN) 300 MG capsule TAKE 1 CAPSULE BY MOUTH TWICE  DAILY    isosorbide mononitrate (IMDUR) 30 mg, Oral, 2 times a day    metFORMIN (GLUCOPHAGE) 1,000 mg, Oral, 2 times a day with meals    metoPROLOL succinate (TOPROL-XL) 100 mg, Oral, Daily (standard)    nitroglycerin (NITROSTAT) 0.4 mg, Sublingual, Every 5 min PRN  omeprazole (PRILOSEC) 20 mg, Oral, Daily (standard)    pravastatin (PRAVACHOL) 10 mg, Oral, Daily (standard)    sertraline (ZOLOFT) 100 mg, Oral, Daily (standard)    tamsulosin (FLOMAX) 0.4 mg, Oral, Daily (standard)    traZODone (DESYREL) 150 mg, Oral, Nightly    trospium (SANCTURA) 20 mg, Oral, Nightly           Past Medical History:   Diagnosis Date    Acute kidney injury (CMS-HCC) 01/12/2018    Anemia     IRON DEFICIENT     Anxiety dont know    Arthritis     Basal cell carcinoma     Cataract     CHF (congestive heart failure) (CMS-HCC)     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    Corneal abrasion 1975    steel in right eye with abrasion and removal    Cubital tunnel syndrome on left 01/16/2019    Depression 2016    Diabetes mellitus (CMS-HCC)     GERD (gastroesophageal reflux disease) 1982    Hypertension     Meningitis 12/25/2015    Mixed hyperlipidemia     Myocardial infarction (CMS-HCC) 1986    Obesity 1986    first back surgery    Peptic ulceration        Past Surgical History:   Procedure Laterality Date    BACK SURGERY      CARDIAC CATHETERIZATION      STENTS PLACEMENT     CARDIAC SURGERY  2005    stents    CORONARY STENT PLACEMENT      FOREIGN BODY REMOVAL Right about 20 years ago    HERNIA REPAIR      RIGHT INGUNIAL    JOINT REPLACEMENT Left     TOTAL SHOULDER    PR ARM/ELBOW TENDON LENGTHEN,SINGLE,EA Left 02/17/2019    Procedure: TENDON LENGTHENING UPPER ARM/ELBOW SNGL EA;  Surgeon: Daisy Lazar, MD;  Location: ASC OR [facility];  Service: Orthopedics    PR CABG, ARTERIAL, SINGLE Midline 03/20/2022    Procedure: CORONARY ARTERY BYPASS, USING ARTERIAL GRAFT(S); SINGLE ARTERIAL GRAFT;  Surgeon: Arlester Marker, MD;  Location: MAIN OR [facility];  Service: Cardiac Surgery    PR CABG, ARTERY-VEIN, SINGLE Midline 03/20/2022    Procedure: CORONARY ARTERY BYPASS, USING VENOUS GRAFT(S) AND ARTERIAL GRAFT(S); SINGLE VEIN GRAFT;  Surgeon: Arlester Marker, MD;  Location: MAIN OR [facility];  Service: Cardiac Surgery    PR CATH PLACE/CORON ANGIO, IMG SUPER/INTERP,W LEFT HEART VENTRICULOGRAPHY N/A 02/01/2022    Procedure: Left Heart Catheterization;  Surgeon: Jacquelyne Balint, MD;  Location: [facility] CATH;  Service: Cardiology    PR ENDOSCOPY W/VIDEO-ASST VEIN HARVEST,CABG Right 03/20/2022    Procedure: ENDOSCOPY, SURGICAL, INCLUDING VIDEO-ASSISTED HARVEST OF VEIN(S) FOR CORONARY ARTERY BYPASS PROCEDURE;  Surgeon: Arlester Marker, MD;  Location: MAIN OR [facility];  Service: Cardiac Surgery    PR OSTEOTOMY 1ST METATARSAL,BASE/SHAFT Left 07/20/2017    Procedure: OSTEOTOMY, W/WO LENGTHENING, SHORTENING OR ANGULAR CORRECTION, METATARSAL; FIRST METATARSAL;  Surgeon: Valda Favia, MD;  Location: ASC OR [facility];  Service: Ortho Foot & Ankle    PR OSTEOTOMY HEEL BONE Left 07/20/2017    Procedure: OSTEOTOMYLoistine Chance W/WO INT FIXA;  Surgeon: Valda Favia, MD;  Location: ASC OR [facility];  Service: Ortho Foot & Ankle    PR OSTEOTOMY METATARSAL (NOT 1ST) Left 06/06/2018    Procedure: OSTEOTOMY, W/WO LENGTHENING, SHORTENING OR ANGULAR CORRECTION, METATARSAL; OTHER THAN FIRST METATARSAL, EA;  Surgeon: Valda Favia, MD;  Location: ASC OR [facility];  Service: Orthopedics    PR PART REMV PHALANX OF TOE Left 06/06/2018    Procedure: PART EXC BONE PHALANX TOE;  Surgeon: Valda Favia, MD;  Location: ASC OR [facility];  Service: Orthopedics    PR RECONSTR TOTAL SHOULDER IMPLANT Left 11/12/2018    Procedure: R28   ARTHROPLASTY, GLENOHUMERAL JOINT; TOTAL SHOULDER(GLENOID & PROXIMAL HUMERAL REPLACEMENT(EG, TOTAL SHOULDER);  Surgeon: Nickola Major, MD;  [facility] OR [facility];  Service: Orthopedics    PR REMOVAL DEEP IMPLANT Left 11/12/2018    Procedure: REMOVE IMPLANT; DEEP SHOULDER;  Surgeon: Nickola Major, MD;  [facility]: [facility] OR [facility];  Service: Orthopedics    PR REPAIR BICEPS LONG TENDON Left 11/12/2018    Procedure: TENODESIS LONG TENDON BICEPS;  Surgeon: Nickola Major, MD;  [facility]: [facility] OR [facility];  Service: Orthopedics    PR REPAIR PERONEAL TENDONS,FIB OSTEOTMY Left 07/20/2017    Procedure: REPR DISLOC PERONEAL TENDONS; W/FIB OSTEOTOMY;  Surgeon: Valda Favia, MD;  Location: ASC OR [facility];  Service: Ortho Foot & Ankle    PR REVISE MEDIAN N/CARPAL TUNNEL SURG Left 02/17/2019    Procedure: R22 NEUROPLASTY AND/OR TRANSPOSITION; MEDIAN NERVE AT CARPAL TUNNEL;  Surgeon: Theodora Blow Jacqlyn Krauss, MD;  Location: [facility] OR [facility];  Service: Orthopedics    PR REVISE ULNAR NERVE AT ELBOW Left 02/17/2019    Procedure: NEUROPLASTY AND/OR TRANSPOSITION; ULNAR NERVE AT ELBOW;  Surgeon: Daisy Lazar, MD;  Location: [facility] OR [facility];  Service: Orthopedics    SHOULDER SURGERY Bilateral     L TSA, R ROTATOR CUFF      SKIN BIOPSY      SPINE SURGERY  1986/1995/2000/2002    LUMBAR FUSION    stent         Family History   Problem Relation Age of Onset    Diabetes Mother     Stroke Mother     Cataracts Mother     Hypertension Mother     Alcohol abuse Father     Cancer Father     Diabetes Father     Cataracts Father     Hypertension Father     Melanoma Father     Hypertension Maternal Grandmother     Cancer Maternal Grandmother     Hypertension Maternal Grandfather     Hypertension Paternal Grandmother     Cataracts Paternal Grandfather     Hypertension Paternal Grandfather     COPD Brother         moker and alcholic    Early death Brother     COPD Sister         was a smoker    Diabetes Sister     COPD Brother         moker    Diabetes Brother     Diabetes Sister     Kidney disease Maternal Aunt         died from kidney diease due to diabeties    No Known Problems Maternal Uncle     No Known Problems Paternal Aunt     No Known Problems Paternal Uncle     No Known Problems Other     Blindness Neg Hx     Thyroid disease Neg Hx     Macular degeneration Neg Hx     Glaucoma Neg Hx     Basal cell carcinoma Neg Hx     Squamous cell carcinoma Neg Hx  Anesthesia problems Neg Hx     Broken bones Neg Hx     Clotting disorder Neg Hx     Collagen disease Neg Hx     Dislocations Neg Hx     Fibromyalgia Neg Hx     Gout Neg Hx     Hemophilia Neg Hx     Osteoporosis Neg Hx     Rheumatologic disease Neg Hx     Scoliosis Neg Hx     Severe sprains Neg Hx     Sickle cell anemia Neg Hx     Spinal Compression Fracture Neg Hx        Social History     Tobacco Use    Smoking status: Never    Smokeless tobacco: Never    Tobacco comments:     never will   Vaping Use    Vaping Use: Never used   Substance Use Topics    Alcohol use: No     Alcohol/week: 0.0 standard drinks of alcohol    Drug use: Not Currently       OBJECTIVE:  BP 161/86  - Pulse 86  - Temp 36.8 ??C (98.2 ??F) (Oral)  - Resp 20  - Ht 167.6 cm (5' 5.98)  - Wt 82.2 kg (181 lb 3.5 oz)  - SpO2 95%  - BMI 29.26 kg/m??   Wt Readings from Last 12 Encounters:   03/27/22 82.2 kg (181 lb 3.5 oz)   03/02/22 83.6 kg (184 lb 3.2 oz)   03/01/22 83 kg (183 lb)   02/21/22 81.2 kg (179 lb)   02/01/22 81.2 kg (179 lb)   01/31/22 81.6 kg (179 lb 12.8 oz)   12/22/21 81.6 kg (180 lb)   11/16/21 81.9 kg (180 lb 9.6 oz)   08/16/21 87.6 kg (193 lb 3.2 oz)   08/09/21 86.2 kg (190 lb 0.3 oz)   05/24/21 90.3 kg (199 lb)   05/11/21 88.9 kg (196 lb)     Physical Exam  Vitals and nursing note reviewed.   Constitutional:       General: He is not in acute distress.     Appearance: He is not ill-appearing. HENT:      Head: Normocephalic and atraumatic.   Cardiovascular:      Rate and Rhythm: Normal rate and regular rhythm.   Pulmonary:      Effort: Pulmonary effort is normal. No respiratory distress.   Skin:     General: Skin is warm and dry.      Comments: Surgical incisions, mid-sternal, s/p CABG 11/27   Neurological:      Mental Status: He is alert and oriented to person, place, and time.   Psychiatric:         Mood and Affect: Mood normal.         Behavior: Behavior normal.         Thought Content: Thought content normal.         Judgment: Judgment normal.             BG/insulin reviewed per EMR.   Glucose, POC (mg/dL)   Date Value   16/01/9603 173   03/26/2022 155   03/26/2022 157   03/26/2022 128   03/25/2022 158   03/25/2022 136   03/25/2022 113   03/25/2022 127   12/28/2015 121   12/28/2015 139   12/28/2015 136   12/27/2015 118   12/27/2015 113   12/27/2015 116   12/27/2015 149   12/26/2015 178  Summary of labs:  Lab Results   Component Value Date    A1C 7.9 (H) 12/22/2021    A1C 7.8 (H) 08/09/2021    A1C 7.7 (H) 04/05/2021     Lab Results   Component Value Date    CREATININE 1.00 03/27/2022     Lab Results   Component Value Date    WBC 5.5 03/27/2022    HGB 9.4 (L) 03/27/2022    HCT 27.6 (L) 03/27/2022    PLT 212 03/27/2022       Lab Results   Component Value Date    NA 139 03/27/2022    K 4.2 03/27/2022    CL 104 03/27/2022    CO2 30.0 03/27/2022    BUN 19 03/27/2022    CREATININE 1.00 03/27/2022    GLU 125 03/27/2022    CALCIUM 9.6 03/27/2022    MG 1.9 03/27/2022    PHOS 4.5 03/27/2022       Lab Results   Component Value Date    BILITOT 0.6 03/20/2022    BILIDIR 0.20 03/20/2022    PROT 5.8 03/20/2022    ALBUMIN 3.5 03/20/2022    ALT 12 03/20/2022    AST 31 03/20/2022    ALKPHOS 41 (L) 03/20/2022

## 2022-03-27 NOTE — Unmapped (Signed)
VSS. A&Ox4. 1L . NSR. UOP adequate. Up in chair ~5 hours. Chesttube to waterseal, decreased output. No complaints of pain.      Problem: Adult Inpatient Plan of Care  Goal: Plan of Care Review  Outcome: Progressing  Goal: Patient-Specific Goal (Individualized)  Outcome: Progressing  Goal: Absence of Hospital-Acquired Illness or Injury  Outcome: Progressing  Intervention: Identify and Manage Fall Risk  Recent Flowsheet Documentation  Taken 03/26/2022 2000 by Hyman Bower, RN  Safety Interventions:   bleeding precautions   environmental modification   fall reduction program maintained   lighting adjusted for tasks/safety   low bed   nonskid shoes/slippers when out of bed  Intervention: Prevent Skin Injury  Recent Flowsheet Documentation  Taken 03/27/2022 0400 by Hyman Bower, RN  Positioning for Skin: Supine/Back  Device Skin Pressure Protection: absorbent pad utilized/changed  Skin Protection: adhesive use limited  Taken 03/27/2022 0200 by Hyman Bower, RN  Positioning for Skin: Supine/Back  Device Skin Pressure Protection: absorbent pad utilized/changed  Skin Protection: adhesive use limited  Taken 03/27/2022 0000 by Hyman Bower, RN  Positioning for Skin: Sitting in Chair  Device Skin Pressure Protection: absorbent pad utilized/changed  Skin Protection: adhesive use limited  Taken 03/26/2022 2200 by Hyman Bower, RN  Positioning for Skin: Sitting in Chair  Device Skin Pressure Protection: absorbent pad utilized/changed  Skin Protection: adhesive use limited  Taken 03/26/2022 2000 by Hyman Bower, RN  Positioning for Skin: Sitting in Chair  Device Skin Pressure Protection: absorbent pad utilized/changed  Skin Protection: adhesive use limited  Intervention: Prevent and Manage VTE (Venous Thromboembolism) Risk  Recent Flowsheet Documentation  Taken 03/27/2022 0400 by Hyman Bower, RN  Anti-Embolism Device Type: SCD, Knee  Anti-Embolism Intervention: (sq hep) Other (Comment)  Anti-Embolism Device Location: BLE  Taken 03/27/2022 0200 by Hyman Bower, RN  Anti-Embolism Device Type: SCD, Knee  Anti-Embolism Intervention: (sq hep) Other (Comment)  Anti-Embolism Device Location: BLE  Taken 03/27/2022 0000 by Hyman Bower, RN  Anti-Embolism Device Type: SCD, Knee  Anti-Embolism Intervention: (sq hep) Other (Comment)  Anti-Embolism Device Location: BLE  Taken 03/26/2022 2200 by Hyman Bower, RN  Anti-Embolism Device Type: SCD, Knee  Anti-Embolism Intervention: (sq hep) Other (Comment)  Anti-Embolism Device Location: BLE  Taken 03/26/2022 2000 by Hyman Bower, RN  Anti-Embolism Device Type: SCD, Knee  Anti-Embolism Intervention: (sq hep) Other (Comment)  Anti-Embolism Device Location: BLE  Intervention: Prevent Infection  Recent Flowsheet Documentation  Taken 03/26/2022 2000 by Hyman Bower, RN  Infection Prevention:   cohorting utilized   hand hygiene promoted  Goal: Optimal Comfort and Wellbeing  Outcome: Progressing  Goal: Readiness for Transition of Care  Outcome: Progressing  Goal: Rounds/Family Conference  Outcome: Progressing     Problem: Wound  Goal: Optimal Coping  Outcome: Progressing  Goal: Optimal Functional Ability  Outcome: Progressing  Intervention: Optimize Functional Ability  Recent Flowsheet Documentation  Taken 03/26/2022 2000 by Hyman Bower, RN  Activity Management: up in chair  Goal: Absence of Infection Signs and Symptoms  Outcome: Progressing  Intervention: Prevent or Manage Infection  Recent Flowsheet Documentation  Taken 03/26/2022 2000 by Hyman Bower, RN  Infection Management: aseptic technique maintained  Goal: Improved Oral Intake  Outcome: Progressing  Goal: Optimal Pain Control and Function  Outcome: Progressing  Goal: Skin Health and Integrity  Outcome: Progressing  Intervention: Optimize Skin Protection  Recent Flowsheet Documentation  Taken 03/27/2022 0400 by Hyman Bower, RN  Pressure Reduction Techniques: frequent weight shift encouraged  Pressure Reduction Devices: pressure-redistributing mattress utilized  Skin Protection: adhesive use limited  Taken 03/27/2022 0200 by Hyman Bower, RN  Pressure Reduction Techniques: frequent weight shift encouraged  Pressure Reduction Devices: pressure-redistributing mattress utilized  Skin Protection: adhesive use limited  Taken 03/27/2022 0000 by Hyman Bower, RN  Pressure Reduction Techniques: frequent weight shift encouraged  Pressure Reduction Devices: pressure-redistributing mattress utilized  Skin Protection: adhesive use limited  Taken 03/26/2022 2200 by Hyman Bower, RN  Pressure Reduction Techniques: frequent weight shift encouraged  Pressure Reduction Devices: pressure-redistributing mattress utilized  Skin Protection: adhesive use limited  Taken 03/26/2022 2000 by Hyman Bower, RN  Activity Management: up in chair  Pressure Reduction Techniques: frequent weight shift encouraged  Pressure Reduction Devices: feet on footrest/footstool  Skin Protection: adhesive use limited  Goal: Optimal Wound Healing  Outcome: Progressing     Problem: Skin Injury Risk Increased  Goal: Skin Health and Integrity  Outcome: Progressing  Intervention: Optimize Skin Protection  Recent Flowsheet Documentation  Taken 03/27/2022 0400 by Hyman Bower, RN  Pressure Reduction Techniques: frequent weight shift encouraged  Pressure Reduction Devices: pressure-redistributing mattress utilized  Skin Protection: adhesive use limited  Taken 03/27/2022 0200 by Hyman Bower, RN  Pressure Reduction Techniques: frequent weight shift encouraged  Pressure Reduction Devices: pressure-redistributing mattress utilized  Skin Protection: adhesive use limited  Taken 03/27/2022 0000 by Hyman Bower, RN  Pressure Reduction Techniques: frequent weight shift encouraged  Pressure Reduction Devices: pressure-redistributing mattress utilized  Skin Protection: adhesive use limited  Taken 03/26/2022 2200 by Hyman Bower, RN  Pressure Reduction Techniques: frequent weight shift encouraged  Pressure Reduction Devices: pressure-redistributing mattress utilized  Skin Protection: adhesive use limited  Taken 03/26/2022 2000 by Hyman Bower, RN  Activity Management: up in chair  Pressure Reduction Techniques: frequent weight shift encouraged  Pressure Reduction Devices: feet on footrest/footstool  Skin Protection: adhesive use limited     Problem: Fall Injury Risk  Goal: Absence of Fall and Fall-Related Injury  Outcome: Progressing  Intervention: Promote Injury-Free Environment  Recent Flowsheet Documentation  Taken 03/26/2022 2000 by Hyman Bower, RN  Safety Interventions:   bleeding precautions   environmental modification   fall reduction program maintained   lighting adjusted for tasks/safety   low bed   nonskid shoes/slippers when out of bed     Problem: Self-Care Deficit  Goal: Improved Ability to Complete Activities of Daily Living  Outcome: Progressing     Problem: Comorbidity Management  Goal: Blood Glucose Levels Within Targeted Range  Outcome: Progressing

## 2022-03-28 MED ORDER — FUROSEMIDE 20 MG TABLET
ORAL_TABLET | Freq: Every day | ORAL | 0 refills | 7 days | Status: CP
Start: 2022-03-28 — End: 2022-04-04
  Filled 2022-03-27: qty 7, 7d supply, fill #0

## 2022-03-28 NOTE — Unmapped (Signed)
[facility] Refill Coordination Note    Specialty Medication(s) to be Shipped:   General Specialty: Praluent    Other medication(s) to be shipped: No additional medications requested for fill at this time     Christopher [name], DOB: May 25, 1950  Phone: [phone] (home)       All above HIPAA information was verified with patient.     Was a Nurse, learning disability used for this call? No    Completed refill call assessment today to schedule patient's medication shipment from the [facility] [phone]).  All relevant notes have been reviewed.     Specialty medication(s) and dose(s) confirmed: Regimen is correct and unchanged.   Changes to medications: Christopher [name] reports no changes at this time.  Changes to insurance: No  New side effects reported not previously addressed with a pharmacist or physician: None reported  Questions for the pharmacist: No    Confirmed patient received a Conservation officer, historic buildings and a Surveyor, mining with first shipment. The patient will receive a drug information handout for each medication shipped and additional FDA Medication Guides as required.       DISEASE/MEDICATION-SPECIFIC INFORMATION        For patients on injectable medications: Patient currently has 0 doses left.  Next injection is scheduled for 12/19.    SPECIALTY MEDICATION ADHERENCE     Medication Adherence    Patient reported X missed doses in the last month: 0  Specialty Medication: PRALUENT PEN 75 mg/mL  Patient is on additional specialty medications: No                          Were doses missed due to medication being on hold? No    Praluent 75 mg/ml: 0 days of medicine on hand        REFERRAL TO PHARMACIST     Referral to the pharmacist: Not needed      [facility]     Shipping address confirmed in Epic.     Delivery Scheduled: Yes, Expected medication delivery date: 04/04/22.     Medication will be delivered via UPS to the prescription address in Epic WAM.    Willette Pa   [facility] Specialty Technician

## 2022-03-28 NOTE — Unmapped (Signed)
Thank you for taking the time in speaking with me today. I am happy to hear that you are home and doing well. I wanted to provide you with the number for the 24/7 Nurse Line.  Please reference [phone] for the 24/7 Nurse line. This is for non-emergent needs or questions.    Arsalan Brisbin, RN

## 2022-04-03 DIAGNOSIS — I10 Essential (primary) hypertension: Principal | ICD-10-CM

## 2022-04-03 MED ORDER — CLOPIDOGREL 75 MG TABLET
ORAL_TABLET | Freq: Every day | ORAL | 2 refills | 100 days | Status: CP
Start: 2022-04-03 — End: ?

## 2022-04-03 MED ORDER — METOPROLOL SUCCINATE ER 100 MG TABLET,EXTENDED RELEASE 24 HR
ORAL_TABLET | Freq: Every day | ORAL | 2 refills | 100 days
Start: 2022-04-03 — End: ?

## 2022-04-03 MED FILL — PRALUENT PEN 75 MG/ML SUBCUTANEOUS PEN INJECTOR: SUBCUTANEOUS | 28 days supply | Qty: 2 | Fill #1

## 2022-04-05 DIAGNOSIS — I10 Essential (primary) hypertension: Principal | ICD-10-CM

## 2022-04-05 DIAGNOSIS — I152 Hypertension secondary to endocrine disorders: Principal | ICD-10-CM

## 2022-04-05 DIAGNOSIS — E1159 Type 2 diabetes mellitus with other circulatory complications: Principal | ICD-10-CM

## 2022-04-05 NOTE — Unmapped (Signed)
[facility] Population Health   Care Management Progress Note    Date: 04/05/2022  Outcome:  Phone outreach completed    Purpose of contact:           Follow up on CCM management. CM discussed the following with the pt:    Pt daughter spoke on behalf of pt, resting from the post-surgery. Pt reported doing surprisingly well and recovery is going smoothly after the surgery. Pt was unable to check BP since the discharge yet plans to check at the clinic for transition visit tomorrow (04/06/22) with Dr. Laymond Purser. Pt reports moderate chest pain and pains under his armpits (4 out of 10) since his the discharge from his recent surgery. Pt denies any difficulty in medication management and keeping eyes on ozempic needing refill soon in late January.    Pt denied any other medical concerns or need for resource coordination at this time. Pt plans to close the care gaps on Hemoglobin A1c check and Foot Exam during the apt at [facility] tomorrow.     Hypertension:  Have blood pressure cuff at home?: yes- arm cuff  Regularly checking blood pressure?: no  Experienced really high blood pressure (systolic >180 mmHg or diastolic >110 mmHg)?: no  Experienced really low blood pressure (systolic <90 mmHg or diastolic <60 mmHg)?: no  Symptoms of low blood pressures? no  Symptoms of high blood pressures? chest pain  How many times in the past 2 weeks have you missed doses of your blood pressure medications? 0  Typical daily diet: well-balanced  Weekly physical activity: <30 mins      PTHomeBP     The patient???s Average Home Blood Pressure during the last two weeks is : N/A based on  readings           Total number of home blood pressures documented over past two weeks: 0        Educational points mentioned:  Avoid sodium-rich foods (salty foods) and stay hydrated  Fast food, deli meat, and canned foods can have a lot of salt in them which will raise your blood pressure  Exercise and a healthy diet can promote weight loss, which is the best thing for lowering blood pressure  Fruits, vegetables, and low-fat dairy products can help to lower blood pressure  Monitor blood pressure at home if possible  Goal: <130/80 per PCP note    Barriers to Care:  N/A    Provider/Care Partner(s) to follow up on:   N/A    Health Maintenance:  Health Maintenance Due   Topic Date Due    Medicare Annual Wellness Visit (AWV)  Never done    Zoster Vaccines (2 of 3) 06/16/2014    Hemoglobin A1c  03/23/2022    Foot Exam  04/05/2022     I addressed these health maintenance gaps with the patient: A1c and Diabetic foot exam  Plan for health maintenance gaps addressed: Added note to upcoming appointment to address gap    Additional Information/Plan:  Patient provided my direct contact information and encouraged to contact me should additional needs arise.    Time Spent Per Day:  04/05/2022: 20    Cheskel Silverio  Population Health  [facility]

## 2022-04-05 NOTE — Unmapped (Signed)
Reason for Visit: Hospital Follow-up Medication Management    History of Present Illness:  Christopher [name] is a 71 y.o. male with a past medical history of HTN, T2DM, HLD, depression, CKD stage 2, tobacco use, TIA who was recently hospitalized from 03/20/22 to 03/27/22 for CAD with angina, CABG x 2 (03/20/22). Pt presents to the [facility] for follow-up without all of his medication bottles.     At discharge,   Start: clopidogrel 75 mg daily x 30 days (end date 04/20/22), ASA 81 mg daily; furosemide 20 mg daily x 7 days (end date 04/04/22), Ozempic 0.25 mg subcutaneous weekly, metoprolol tartrate 25 mg 1/2 tablet BID, oxycodone 5 mg q6h prn x 7 days    Stop: amlodipine 10 mg daily, isosorbide mononitrate 30 mg daily, metoprolol succinate 100 mg daily, nitroglycerin 0.4 mg SL PRN    Since discharge, feels good; trouble swallowing with regurgitation; Checks FBG every morning, avg 152 mg/dL; took some stool softener when first home but does not feel constipated; tolerating Ozempic well, no nausea.     [X]  cardiac rehab - referral to be made at cardiology follow up visit scheduled 04/11/22   [X]  smoking cessation - never smoker  [X]  glucometer - has supplies    Medication Adherence and Access:  Missed doses?: no  Uses pillbox?: yes  Anyone else assist with medication organization? no  Current insurance coverage: UHC Medicare Advantage; [employer] in 2024  Preferred Pharmacy: [facility] [address] mail order    Medications affordable?: yes- Medicare Extra Help, Praluent program  Needs refills? no    Allergies:   Allergies   Allergen Reactions    Morphine Hives    Atorvastatin      neuropathy    Atorvastatin Calcium      neuropathy    Baclofen      headache  headache    Ibuprofen      GI bleed    Pneumococcal 23-Valent Polysaccharide Vaccine Other (See Comments)     Redness and pain at injection site. Fatigue  Pt stated his arm swelled within 24hrs after injection    Zetia [Ezetimibe] Other (See Comments)     Pt states it effected his brain  Pt stated it effected his sight, I couldn't drive a car at all       Medications: Medications reviewed in EPIC medication station and updated today by the clinical pharmacist practitioner.  Current Outpatient Medications on File Prior to Visit   Medication Sig Notes    alirocumab 75 mg/mL PnIj Inject the contents of 1 pen (75 mg) under the skin every fourteen (14) days. Due 12/19    aspirin (ECOTRIN) 81 MG tablet Take 1 tablet (81 mg total) by mouth daily. Taking     blood sugar diagnostic (GLUCOSE BLOOD) Strp Check blood sugar as directed once a day and for symptoms of high or low blood sugar.     blood-glucose meter Misc Use as instructed. Checking FBG daily      clopidogreL (PLAVIX) 75 mg tablet TAKE 1 TABLET BY MOUTH DAILY Taking - follow up duration with cardiology     diclofenac sodium (VOLTAREN) 1 % gel Apply 2 g topically four (4) times a day. PRN    docusate sodium (COLACE) 100 MG capsule Take 1 capsule (100 mg total) by mouth two (2) times a day. Not using    fluticasone propionate (FLONASE) 50 mcg/actuation nasal spray 2 sprays into each nostril daily. PRN  furosemide (LASIX) 20 MG tablet Take 1 tablet (20 mg total) by mouth daily for 7 days. Completed course    gabapentin (NEURONTIN) 300 MG capsule TAKE 1 CAPSULE BY MOUTH TWICE  DAILY Taking 2 in the morning 2 at night     lancets Misc Check blood sugar as directed once a day and for symptoms of high or low blood sugar.     metFORMIN (GLUCOPHAGE) 1000 MG tablet Take 1 tablet (1,000 mg total) by mouth in the morning and 1 tablet (1,000 mg total) in the evening. Take with meals. Taking BID    metoPROLOL tartrate (LOPRESSOR) 25 MG tablet Take 0.5 tablets (12.5 mg total) by mouth two (2) times a day. Taking BID     omeprazole (PRILOSEC) 20 MG capsule Take 1 capsule (20 mg total) by mouth daily. Taking daily    [EXPIRED] oxyCODONE (ROXICODONE) 5 MG immediate release tablet Take 1 tablet (5 mg total) by mouth every six (6) hours as needed for up to 7 days. Not taking - never used     pravastatin (PRAVACHOL) 10 MG tablet Take 1 tablet (10 mg total) by mouth daily. Taking daily    semaglutide (OZEMPIC) 0.25 mg or 0.5 mg (2 mg/3 mL) PnIj Inject 0.25 mg under the skin every seven (7) days for 28 days, THEN 0.5 mg every seven (7) days for 28 days. Wednesdays;     [START ON 05/19/2022] semaglutide (OZEMPIC) 1 mg/dose (4 mg/3 mL) PnIj injection Inject 1 mg under the skin every seven (7) days for 4 doses. Not taking- future dose    sertraline (ZOLOFT) 100 MG tablet TAKE 1 TABLET BY MOUTH DAILY Taking     tamsulosin (FLOMAX) 0.4 mg capsule Take 1 capsule (0.4 mg total) by mouth daily. (Patient taking differently: Take 1 capsule (0.4 mg total) by mouth nightly.) Taking daily  at night    traZODone (DESYREL) 150 MG tablet TAKE 1 TABLET BY MOUTH AT NIGHT Taking nightly    trospium (SANCTURA) 20 mg tablet Take 1 tablet (20 mg total) by mouth nightly. (Patient taking differently: Take 1 tablet (20 mg total) by mouth in the morning.) Taking daily     No current facility-administered medications on file prior to visit.       Assessment and Plan:     # T2DM- Controlled with today's A1c of 6.7%. Goal per ADA guidelines is <7.0%. Provided counseling today on Ozempic mechanism, common ADEs, q4 week dose adjustment, as well as the current national supply chain issues. Patient is injecting Ozempic on Wednesdays.   Continue metformin 1000 mg BID, Ozepmic 0.25 mg weekly   Increase Ozempic dose to 0.5 mg weekly on 04/26/22 or after 4 weeks of 0.25 mg dose  Follow up with PCP      # CAD Discharge summary notes patient is to take clopidogrel for 30 days post discharge. Patient has been on clopidogrel 75 mg for many years, with records dating back to 2005 in EMR. Suspect clopidogrel 75 mg should be continued for 12 months. Vs. Indefinitely  Follow up with Cardiology about clopidogrel duration at office visit    # Medication Management   Encouraged use of medication pill box and reviewed appropriate use. Reviewed the indication, dose, and frequency of each medication with patient.       Recommendations and medication-related problems were discussed directly with the patient's transitions of care physician, Dr. Laymond Purser, immediately following the pharmacist visit prior to the physician visit. Questions/concerns were addressed to the patient's  satisfaction. I spent a total of 30 minutes face to face with the patient delivering clinical care and providing education/counseling.      Future Appointments   Date Time Provider Department Center   04/11/2022 10:30 AM [facility] DIAG RM 11 [facility] [place]   04/11/2022 11:00 AM CARSER EKG [id number]   04/11/2022 11:30 AM CT SURGERY NP [facility]LO [id number]   05/25/2022 11:00 AM Lorretta Harp, MD [facility]     ___________________________________________________     Karalee Height, PharmD CPP  [facility] Clinical Pharmacist

## 2022-04-06 ENCOUNTER — Ambulatory Visit: Admit: 2022-04-06 | Discharge: 2022-04-07 | Payer: MEDICARE

## 2022-04-06 MED ORDER — GABAPENTIN 300 MG CAPSULE
ORAL_CAPSULE | Freq: Two times a day (BID) | ORAL | 1 refills | 45 days | Status: CP
Start: 2022-04-06 — End: ?

## 2022-04-06 NOTE — Unmapped (Signed)
Transitions of Care Note    Subjective      Admission Date: 03/20/22  Discharge Date: 03/27/22  Discharge Hospital/Unit: [facility]  The patient was discharged [facility] and sent to his home.    Post discharge interactive communication via telephone was made with patient on 12/5 and I have reviewed the information from that communication.    Today's (04/13/2022) face to face interactive visit is within 14 days days of discharge.    Chief Complaint: Patient is here in follow-up to their recent hospitalization and to discuss the following medical problems: CAD s/p CABG, T2DM, HTN, GI dysfunction    HISTORY OF PRESENT ILLNESS:    Christopher [name] is a 71 y.o. male who presents for transitions hospital follow up.    Hospitalized for: CAD s/p CABG, T2DM, HTN, GI dysfunction    Interval update:   - Doing well after surgery  - No concerns    Pt reported factors contributing to admission or ED visit: None    Patient uses pill box yes    PHQ-9 PHQ-9 TOTAL SCORE   04/06/2022   9:00 AM 0   04/05/2021   9:00 AM 0   03/30/2020   9:00 AM 0   04/21/2019   3:00 PM 1   12/27/2018   3:00 PM 0   11/19/2018   9:00 AM 11   12/11/2017   9:00 AM 0       The remaining 10 systems reviewed were negative.      Patient has been seen by the pharmacist today and I have reviewed their note and recommendations. See pharmacist note for details.  Patient has been seen by the social worker today and I have reviewed their note and recommendations.  See social work note for details.    I have reviewed the patients discharge summary for this hospitalization.  I have also reviewed the problem list, allergies, family and social history and updated them as needed.         Objective     Mr. Wardell Jr  height is 167.6 cm (5' 5.98) and weight is 79.3 kg (174 lb 12.8 oz). His temporal temperature is 36.1 ??C (96.9 ??F). His blood pressure is 108/75 and his pulse is 92.     GEN: well appearing, NAD    HEENT: NCAT, No scleral icterus. Conjunctiva non-erythematous. MMM.   CV: Regular rate and rhythm. No murmurs/rubs/gallops.   Pulm: Normal work of breathing on RA. CTAB. No wheezing, crackles, or rhonchi.   Abd: Flat.  Nontender. No guarding, rebound.  Normoactive bowel sounds.     Neuro: A&O x 3. No focal deficits.   Ext: No peripheral edema.  Palpable distal pulses.   Skin: No obvious rashes or skin lesions.    Labs:  I have reviewed the labs from this hospitalization and the ones on the day of discharge and have followed up on any pending labs at the time of discharge.  See Epic Labs section for details.         Assessment/Plan:     Problem List Items Addressed This Visit          Digestive    Gastroesophageal reflux disease     Pt with history of GERD c/b esophageal stricture requiring dilatation.  Pt with increased difficulty swallowing solids, will refer to GI.              Endocrine    Type 2 diabetes mellitus  with stage 2 chronic kidney disease, without long-term current use of insulin (CMS-HCC)     Pt A1c controlled at 6.7, continue current regimen    Lab Results   Component Value Date    A1C 6.7 04/06/2022                Cardiovascular and Mediastinum    Coronary artery disease involving native coronary artery of native heart with angina pectoris (CMS-HCC)     S/p second CABG 11/27, doing well without any concerns.  F/u scheduled with cardiology              Other    Back pain    Relevant Medications    gabapentin (NEURONTIN) 300 MG capsule     Other Visit Diagnoses       Dysphagia, unspecified type    -  Primary    Relevant Orders    Ambulatory referral to Gastroenterology             Medications prescribed or ordered upon discharge were reviewed on 12/14 and reconciled with the most recent outpatient medication list. I have reviewed and agree with this medication reconciliation.      The following medications changes were made: None    Biggest Risk for Readmission: NA    Items to follow-up on next visit:   - Ensure continued cardiology follow-up, duration of Plavix treatment    Medical decision making was of moderate (16109- must be seen within 14 days) complexity.    Necessary referral have been made.  See Visit Summary for details of referrals.    I will forward my plan and recommendations to patients PCP, Katrinka Blazing, Tawni Millers, MD    Follow-up with PCP or another provider has been scheduled:   Future Appointments   Date Time Provider Department Center   05/25/2022 11:00 AM Lorretta Harp, MD [facility]   06/06/2022  9:20 AM Amada Kingfisher, MD [facility]   08/22/2022  8:30 AM Corinda Gubler, PA [facility]        Total time spent face to face with the patient was 55 minutes of  which 30 minutes were spent counseling/coordinating care regarding his: recent hospitalization and the following conditions: CAD s/p CABG, T2DM, HTN, GI dysfunction         [facility] [place] [facility]  [address], [place], [place] [address]  Telephone [phone]  Fax [phone]  [email or web address]

## 2022-04-06 NOTE — Unmapped (Signed)
Thank you for attending your hospital follow-up appointment today. Here is a summary of our visit.        Social Work Resources:   You met with Katie today. If you need something, you can contact:    Sherran Needs, LCSW  Care Manager  [facility]  [phone]      If you need to schedule an appointment or get a message to your provider:  Please go to [email or web address] and sign in to your [facility] Chart or call us at [phone].     If you need to request medication refills:  Please request a refill via [facility] Chart at [email or web address], or have your pharmacy send a request electronically or by sending a fax to [phone].    If you have urgent healthcare needs after normal business hours, on weekends, or during holidays:  We have extended hours available on Monday, Tuesday, and Thursday (5-7pm). Call [phone] to schedule an appointment.   [facility] provides extended hours and greater access to care for all patients. Run by [facility], we offer walk-in care for health issues that do not require a trip to the emergency room. No appointment needed.   Sunday: 12:00PM to 5:00PM  Monday - Friday: 8:00AM to 7:00PM  Saturday: 12:00PM to 5:00PM  Call the [facility] 24/7 Nursing Line at [phone] to get nurse advice.

## 2022-04-06 NOTE — Unmapped (Signed)
Family Medicine  Care Management Transitions of Care Note    Presenting Problem:  Christopher Christopher [name] has been identified as a Transitions patient who is at risk for readmission.    Christopher Christopher [name] presents to [facility] 10 days post discharge, accompanied by his wife/Christopher Christopher [name]. He was hospitalized for CABG. He reports feeling good since discharge.     Assessment:  Social History     Socioeconomic History    Marital status: Married     Spouse name: Christopher Christopher [name]    Number of children: None    Years of education: None    Highest education level: None   Occupational History    Occupation: Insurance claims handler work   Tobacco Use    Smoking status: Never    Smokeless tobacco: Never    Tobacco comments:     never will   Vaping Use    Vaping Use: Never used   Substance and Sexual Activity    Alcohol use: No     Alcohol/week: 0.0 standard drinks of alcohol    Drug use: Not Currently    Sexual activity: Yes     Partners: Female   Other Topics Concern    Do you use sunscreen? No    Tanning bed use? No    Are you easily burned? Yes    Excessive sun exposure? Yes    Blistering sunburns? Yes    Exercise Yes    Living Situation No     Social Determinants of Health     Financial Resource Strain: Low Risk  (03/21/2022)    Overall Financial Resource Strain (CARDIA)     Difficulty of Paying Living Expenses: Not very hard   Food Insecurity: No Food Insecurity (03/21/2022)    Hunger Vital Sign     Worried About Running Out of Food in the Last Year: Never true     Ran Out of Food in the Last Year: Never true   Transportation Christopher [name]: No Transportation Christopher [name] (03/21/2022)    PRAPARE - Therapist, art (Medical): No     Lack of Transportation (Non-Medical): No       Home Health Services:   Current Home Health Agency: N/A  DME:   DME Agency: Unknown   Equipment: cane    Personal Care Service/Personal Aide:    PCS Agency: N/A    Behavioral Health:    PHQ9: was completed with a score of 0  Behavioral Health Provider: N/A  Current Mental Health Status:  Patient reports no concerns with mental health.     Advanced Directives: does not have on file. Health Care Decision Maker was confirmed today. SW provided advance directive paperwork to patient.     Upcoming appointments:  Future Appointments   Date Time Provider Department Center   04/06/2022  9:20 AM FAMMED TRANSITION CLINIC [facility]   04/06/2022 10:00 AM Christopher Redbird, MD [facility]   04/11/2022 10:30 AM [facility] DIAG RM 11 [facility] [place]   04/11/2022 11:00 AM CARSER EKG [id number]   04/11/2022 11:30 AM CT SURGERY NP [facility]LO [id number]   05/25/2022 11:00 AM Christopher Harp, MD [facility]          Intervention:  Introduced self and role at [facility].  Reviewed events leading to hospitalization.  SW used problem solving skills to determine barriers to care.  Gathered current concerns related to current health conditions, medications, and mental health.  SW debriefed information  with pharmacist and provider.       Additional information/Plan:  Christopher Christopher [name] was provided with this writer's direct office phone number should additional Christopher [name] arise.      Christopher Needs, LCSW  Care Manager  [phone]

## 2022-04-11 ENCOUNTER — Ambulatory Visit: Admit: 2022-04-11 | Discharge: 2022-04-12 | Payer: MEDICARE

## 2022-04-11 ENCOUNTER — Ambulatory Visit: Admit: 2022-04-11 | Discharge: 2022-04-12 | Payer: MEDICARE | Attending: Family | Primary: Family

## 2022-04-11 DIAGNOSIS — I25119 Atherosclerotic heart disease of native coronary artery with unspecified angina pectoris: Principal | ICD-10-CM

## 2022-04-11 NOTE — Unmapped (Cosign Needed)
HPI:  Christopher [name] is a 71 y.o. who presents to clinic for follow-up.  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 [name].  Post-op course was uncomplicated.  EF >55%.  Christopher [name] was discharged to home on 03/27/22.      Patient reports doing well since discharge to home.  Christopher [name] is tolerating a regular diet with normal bowel function and denies fevers or chills.  Chest soreness continues to improve.  The patient denies acute chest pain/ pressure and denies lower extremity edema.  Shortness of breath continues to improve.  The patient is ambulatory with improving exercise tolerance.    Has follow-up scheduled with cardiology in Feb 2024 already scheduled in [place].       Medications:  Current Outpatient Medications   Medication Sig Dispense Refill    alirocumab 75 mg/mL PnIj Inject the contents of 1 pen (75 mg) under the skin every fourteen (14) days. 2 mL 11    aspirin (ECOTRIN) 81 MG tablet Take 1 tablet (81 mg total) by mouth daily. 30 tablet 11    blood sugar diagnostic (GLUCOSE BLOOD) Strp Check blood sugar as directed once a day and for symptoms of high or low blood sugar. 100 strip 0    blood-glucose meter Misc Use as instructed. 1 each 0    clopidogreL (PLAVIX) 75 mg tablet TAKE 1 TABLET BY MOUTH DAILY 100 tablet 2    diclofenac sodium (VOLTAREN) 1 % gel Apply 2 g topically four (4) times a day. 100 g 0    docusate sodium (COLACE) 100 MG capsule Take 1 capsule (100 mg total) by mouth two (2) times a day. 60 capsule 0    fluticasone propionate (FLONASE) 50 mcg/actuation nasal spray 2 sprays into each nostril daily. 48 mL 3    gabapentin (NEURONTIN) 300 MG capsule Take 2 capsules (600 mg total) by mouth two (2) times a day. TAKE 1 CAPSULE BY MOUTH TWICE  DAILY 180 capsule 1    lancets Misc Check blood sugar as directed once a day and for symptoms of high or low blood sugar. 100 each 0    metFORMIN (GLUCOPHAGE) 1000 MG tablet Take 1 tablet (1,000 mg total) by mouth in the morning and 1 tablet (1,000 mg total) in the evening. Take with meals. 180 tablet 3    metoPROLOL tartrate (LOPRESSOR) 25 MG tablet Take 0.5 tablets (12.5 mg total) by mouth two (2) times a day. 30 tablet 0    omeprazole (PRILOSEC) 20 MG capsule Take 1 capsule (20 mg total) by mouth daily. 90 capsule 3    pravastatin (PRAVACHOL) 10 MG tablet Take 1 tablet (10 mg total) by mouth daily. 30 tablet 11    semaglutide (OZEMPIC) 0.25 mg or 0.5 mg (2 mg/3 mL) PnIj Inject 0.25 mg under the skin every seven (7) days for 28 days, THEN 0.5 mg every seven (7) days for 28 days. 6 mL 0    [START ON 05/19/2022] semaglutide (OZEMPIC) 1 mg/dose (4 mg/3 mL) PnIj injection Inject 1 mg under the skin every seven (7) days for 4 doses. 3 mL 0    sertraline (ZOLOFT) 100 MG tablet TAKE 1 TABLET BY MOUTH DAILY 90 tablet 2    tamsulosin (FLOMAX) 0.4 mg capsule Take 1 capsule (0.4 mg total) by mouth daily. (Patient taking differently: Take 1 capsule (0.4 mg total) by mouth  nightly.) 90 capsule 3    traZODone (DESYREL) 150 MG tablet TAKE 1 TABLET BY MOUTH AT NIGHT 90 tablet 2    trospium (SANCTURA) 20 mg tablet Take 1 tablet (20 mg total) by mouth nightly. (Patient taking differently: Take 1 tablet (20 mg total) by mouth in the morning.) 90 tablet 3     No current facility-administered medications for this visit.         Vitals:    04/11/22 1055   BP: 136/88   Pulse: 86   Temp: 36.2 ??C (97.2 ??F)   SpO2: 98%     General: alert and oriented, resting comfortably in NAD  HEENT: normocephalic, atraumatic. sclera anicteric, MMM  Neck: Supple.  Pulmonary: non-labored breathing, lungs CTAB, No wheezes, rales or rhonchi.   CV: normal rate and regular rhythm. Normal S1, S2. No murmurs, rubs or gallops. 2+ DP and radial pulses bilaterally.   Abdomen/GI: soft, non-tender, non-distended. positive bowel sounds throughout abdomen. no rebound or guarding present.  Neurologic: A&O x 3, answering questions appropriately. strength equal in upper & lower extremities bilaterally. sensation intact throughout. no facial droop noted.  Extremities: warm and well perfused.   Skin: warm and dry. No rashes. Midline sternotomy incision clean/dry/intact without erythema or drainage. EVH to RLE c/d/i and OTA.    Diagnostic Studies:    ECG: NORMAL SINUS RHYTHM  NONSPECIFIC T WAVE ABNORMALITY  ABNORMAL ECG  WHEN COMPARED WITH ECG OF 21-Mar-2022 08:22,  ST NO LONGER ELEVATED IN LATERAL LEADS  T WAVE INVERSION NOW EVIDENT IN ANTERIOR LEADS    CXR:  Lungs are clear. No pleural effusion or pneumothorax.      Unremarkable cardiomediastinal silhouette with sternotomy for CABG. Left shoulder prostheses.       Assessment/Plan:   Christopher [name] is a very pleasant 71 y.o. male s/p CABG x2 who presents to clinic for follow-up visit.  He has been progressing well since discharge to home on 03/27/22.     Volume status: Euvolemic. Stable CXR with normal LVEF. STOP lasix at this time.    Slight HTN: Clinic BP today 136 systolic however patient states at home SBP runs 120s consistently. No changes in medications.    Dual antiplatelet therapy s/p CABG: Continue Plavix for one month duration then stop. Continue ASA 81mg  po daily indefinitely. Of note, patient was on Plavix pre-op for stent to LAD that was bypassed in surgery. Should not need to continue Plavix after 1 month duration but will defer to cards.    Follow up with cardiology going forward.    Follow-up in our clinic prn going forward. Encouraged to call with any ongoing questions or concerns at any time.   Patient does not wish to have referral to cardiac surgery.

## 2022-04-13 NOTE — Unmapped (Signed)
Pt A1c controlled at 6.7, continue current regimen    Lab Results   Component Value Date    A1C 6.7 04/06/2022

## 2022-04-13 NOTE — Unmapped (Signed)
S/p second CABG 11/27, doing well without any concerns.  F/u scheduled with cardiology

## 2022-04-13 NOTE — Unmapped (Signed)
Pt with history of GERD c/b esophageal stricture requiring dilatation.  Pt with increased difficulty swallowing solids, will refer to GI.

## 2022-04-13 NOTE — Unmapped (Signed)
[facility] Clinical Intervention    Type of intervention: Medication administration    Medication involved: Ozempic    Problem identified: Romeo Apple wanted to order more Ozempic today and technician identified he had used both pens (56 day supply) in 3 weeks. I spoke with patient and learned he had injected 0.25 mg once weekly x 2 then discarded first pen. He then injected 0.5 mg from second pen and then discarded.    Intervention performed: I counseled that each pen contains 2 mg, and has stability of 56 days once opened. Patient was able to get second pen out of sharps container (first pen thrown in regular trash/gone).     I recommended that he continue to inject 0.5 mg each week x3 to use up the last medication in his pen. I will loop in his provider to make sure they don't have other thoughts.     Follow-up needed: na    Approximate time spent: 10-15 minutes    Clinical evidence used to support intervention: Professional judgement    Result of the intervention: Improved medication adherence    Modene Andy A Desiree [facility] Specialty Pharmacist

## 2022-04-13 NOTE — Unmapped (Signed)
[facility] Refill Coordination Note    Specialty Lite Medication(s) to be Shipped:   General Specialty: Praluent    Other medication(s) to be shipped: No additional medications requested for fill at this time     Christopher [name], DOB: [birth year]  Phone: [phone] (home)       All above HIPAA information was verified with patient.     Was a Nurse, learning disability used for this call? No    Changes to medications: [name] reports no changes at this time.  Changes to insurance: No      REFERRAL TO PHARMACIST     Referral to the pharmacist: Not needed      [facility]     Shipping address confirmed in Epic.     Delivery Scheduled: Yes, Expected medication delivery date: 04/20/22.     Medication will be delivered via UPS to the prescription address in Epic WAM.    Ricci Barker   [facility] Specialty Technician

## 2022-04-18 MED ORDER — METOPROLOL TARTRATE 25 MG TABLET
ORAL_TABLET | Freq: Two times a day (BID) | ORAL | 0 refills | 30 days
Start: 2022-04-18 — End: 2022-05-18

## 2022-04-19 MED ORDER — METOPROLOL TARTRATE 25 MG TABLET
ORAL_TABLET | Freq: Two times a day (BID) | ORAL | 3 refills | 90 days | Status: CP
Start: 2022-04-19 — End: 2023-04-19
  Filled 2022-04-27: qty 90, 90d supply, fill #0

## 2022-04-19 NOTE — Unmapped (Signed)
Zee Vill [name] 's entire shipment will be delayed as a result of the medication is too soon to refill until 04/24/2022.     I have reached out to the patient  at [phone] and communicated the delivery change. We will reschedule the medication for the delivery date that the patient agreed upon.  We have confirmed the delivery date as 04/26/2022, via ups.

## 2022-04-24 MED ORDER — BLOOD-GLUCOSE METER
0 refills | 0 days
Start: 2022-04-24 — End: 2023-04-24

## 2022-04-26 ENCOUNTER — Other Ambulatory Visit: Payer: Self-pay

## 2022-04-26 ENCOUNTER — Emergency Department
Admission: EM | Admit: 2022-04-26 | Discharge: 2022-04-26 | Disposition: A | Discharge status: Home / Self Care | Payer: Medicare HMO | Attending: Emergency Medicine | Admitting: Emergency Medicine

## 2022-04-26 ENCOUNTER — Emergency Department: Payer: Medicare HMO

## 2022-04-26 DIAGNOSIS — X58XXXA Exposure to other specified factors, initial encounter: Secondary | ICD-10-CM | POA: Insufficient documentation

## 2022-04-26 DIAGNOSIS — R519 Headache, unspecified: Secondary | ICD-10-CM | POA: Diagnosis not present

## 2022-04-26 DIAGNOSIS — S199XXA Unspecified injury of neck, initial encounter: Secondary | ICD-10-CM | POA: Diagnosis present

## 2022-04-26 DIAGNOSIS — S139XXA Sprain of joints and ligaments of unspecified parts of neck, initial encounter: Secondary | ICD-10-CM | POA: Diagnosis not present

## 2022-04-26 DIAGNOSIS — I209 Angina pectoris, unspecified: Principal | ICD-10-CM

## 2022-04-26 DIAGNOSIS — E1169 Type 2 diabetes mellitus with other specified complication: Principal | ICD-10-CM

## 2022-04-26 DIAGNOSIS — E785 Hyperlipidemia, unspecified: Principal | ICD-10-CM

## 2022-04-26 DIAGNOSIS — Z789 Other specified health status: Principal | ICD-10-CM

## 2022-04-26 DIAGNOSIS — I240 Acute coronary thrombosis not resulting in myocardial infarction: Principal | ICD-10-CM

## 2022-04-26 DIAGNOSIS — I259 Chronic ischemic heart disease, unspecified: Principal | ICD-10-CM

## 2022-04-26 MED ORDER — BUPIVACAINE HCL (PF) 0.5 % IJ SOLN
10.0000 mL | Freq: Once | INTRAMUSCULAR | Status: DC
  Filled 2022-04-26: qty 10

## 2022-04-26 MED ORDER — PROCHLORPERAZINE EDISYLATE 10 MG/2ML IJ SOLN
10.0000 mg | Freq: Once | INTRAMUSCULAR | Status: AC
  Administered 2022-04-26: 10 mg via INTRAVENOUS
  Filled 2022-04-26: qty 2

## 2022-04-26 MED ORDER — CYCLOBENZAPRINE HCL 10 MG PO TABS
5.0000 mg | ORAL_TABLET | Freq: Once | ORAL | Status: AC
  Administered 2022-04-26: 5 mg via ORAL
  Filled 2022-04-26: qty 1

## 2022-04-26 MED ORDER — ALUM & MAG HYDROXIDE-SIMETH 200-200-20 MG/5ML PO SUSP
30.0000 mL | Freq: Once | ORAL | Status: DC
  Filled 2022-04-26: qty 30

## 2022-04-26 MED ORDER — LIDOCAINE VISCOUS HCL 2 % MT SOLN
15.0000 mL | Freq: Once | OROMUCOSAL | Status: DC
  Filled 2022-04-26: qty 15

## 2022-04-26 MED ORDER — SODIUM CHLORIDE 0.9 % IV BOLUS
500.0000 mL | Freq: Once | INTRAVENOUS | Status: AC
  Administered 2022-04-26: 500 mL via INTRAVENOUS

## 2022-04-26 MED ORDER — LIDOCAINE 5 % EX PTCH
1.0000 | MEDICATED_PATCH | CUTANEOUS | Status: DC
  Administered 2022-04-26: 1 via TRANSDERMAL
  Filled 2022-04-26: qty 1

## 2022-04-26 MED ORDER — BLOOD-GLUCOSE METER
0 refills | 0 days
Start: 2022-04-26 — End: 2023-04-24

## 2022-04-26 NOTE — ED Notes (Signed)
Says neck pain center back of neck started yesterday and it spread to sides of neck.  Pain with any movement of neck.  No pain in head.  No fever.  Had heart surgery  one month ago.

## 2022-04-26 NOTE — Discharge Instructions (Signed)
Please seek medical attention for any high fevers, chest pain, shortness of breath, change in behavior, persistent vomiting, bloody stool or any other new or concerning symptoms.  

## 2022-04-26 NOTE — ED Notes (Signed)
Called to pt's room and pt stated he wants to go home. Pt stated that "nothing was working and I just want to go home." This RN told the pt and family that I would be happy to let the doctor know. MD made aware.

## 2022-04-26 NOTE — ED Notes (Signed)
First Nurse Note: Pt to ED via [facility] from home for headache that radiates into his neck since yesterday. Pt reports hx/o same. Pt had double bypass surgery 1 month ago.   BP: 160/105- hx/o HTN- has not taken medication this morning HR 95 CBG 141 SpO2 96% RA

## 2022-04-26 NOTE — ED Provider Triage Note (Signed)
Emergency Medicine Provider Triage Evaluation Note  EMARI DEMMER , a 72 y.o. male  was evaluated in triage.  Pt complains of posterior headache since yesterday. No injury. Took tylenol without improvement.  No cervical pain.    Review of Systems  Positive:  Negative: No N/V,  No change in vision  Physical Exam  There were no vitals taken for this visit. Gen:   Awake, no distress   Resp:  Normal effort  MSK:   Moves extremities without difficulty  Other:    Medical Decision Making  Medically screening exam initiated at 8:07 AM.  Appropriate orders placed.  Kiyon Fidalgo Abeln was informed that the remainder of the evaluation will be completed by another provider, this initial triage assessment does not replace that evaluation, and the importance of remaining in the ED until their evaluation is complete.     Johnn Hai, PA-C 04/26/22 [phone]

## 2022-04-26 NOTE — ED Triage Notes (Signed)
Pt to ED via [facility] from home for headache. Pt states that it started yesterday. Pt states that the pain has gotten worse and now he is unable to move his neck. Pt states that he has not taken any of his medications this morning. Pt is currently in NAD.

## 2022-04-26 NOTE — ED Provider Notes (Signed)
[facility] Provider Note    Event Date/Time   First MD Initiated Contact with Patient 04/26/22 1058     (approximate)   History   Headache   HPI  Brent Phillips is a 72 y.o. male  who presents to the emergency department today because of concern for headache and neck pain. Patient's symptoms started yesterday morning. He first noticed it upon awakening. The patient denies any unusual activity, or trauma the day before it happened. The pain is located on both sides of the neck and extends from his occiput down to the top of his shoulder blades. It is extremely painful to palpitation and movement. Denies similar symptoms in the past.       Physical Exam   Triage Vital Signs: ED Triage Vitals  Enc Vitals Group     BP 04/26/22 0807 (!) 169/102     Pulse Rate 04/26/22 0807 (!) 108     Resp 04/26/22 0807 16     Temp 04/26/22 0807 97.8 F (36.6 C)     Temp Source 04/26/22 0807 Oral     SpO2 04/26/22 0807 95 %     Weight 04/26/22 0808 170 lb (77.1 kg)     Height 04/26/22 0808 5\' 5"  (1.651 m)     Head Circumference --      Peak Flow --      Pain Score 04/26/22 0807 10     Pain Loc --      Pain Edu? --      Excl. in [place]? --     Most recent vital signs: Vitals:   04/26/22 0807  BP: (!) 169/102  Pulse: (!) 108  Resp: 16  Temp: 97.8 F (36.6 C)  SpO2: 95%   General: Awake, alert, oriented. CV:  Good peripheral perfusion. Tachycardia Resp:  Normal effort. Lungs clear Abd:  No distention.  MSK:  Tenderness to the cervical paraspinous muscles. No midline tenderness.  Skin:  Extreme tenderness to light palpation of nape and upper back. No rash.    ED Results / Procedures / Treatments   Labs (all labs ordered are listed, but only abnormal results are displayed) Labs Reviewed - No data to display   EKG  None   RADIOLOGY I independently interpreted and visualized the CT head. My interpretation: No intracranial bleed. Radiology  interpretation:  IMPRESSION:  No acute finding or explanation for headache.      PROCEDURES:  Critical Care performed: No  Procedures   MEDICATIONS ORDERED IN ED: Medications - No data to display   IMPRESSION / MDM / [place] / ED COURSE  I reviewed the triage vital signs and the nursing notes.                              Differential diagnosis includes, but is not limited to, migraine, intracranial bleed, cervical strain  Patient's presentation is most consistent with acute presentation with potential threat to life or bodily function.  Patient presented to the emergency department today because of concern for headache and neck pain. No trauma. On exam patient does have tenderness to palpation of the paracervical spine muscles. No midline tenderness. Head ct obtained from triage without concerning intracranial abnormality. Patient was given medication to help with his discomfort. After receiving muscle relaxer patient requested discharge.      FINAL CLINICAL IMPRESSION(S) / ED DIAGNOSES   Final diagnoses:  Neck sprain, initial  encounter     Note:  This document was prepared using Dragon voice recognition software and may include unintentional dictation errors.    Nance Pear, MD 04/26/22 [phone]

## 2022-04-27 MED FILL — PRALUENT PEN 75 MG/ML SUBCUTANEOUS PEN INJECTOR: SUBCUTANEOUS | 28 days supply | Qty: 2 | Fill #2

## 2022-04-27 NOTE — Unmapped (Signed)
Spoke to patient stated he is having same issues with muscles locking up wife had to help him out of bed this am takes pravastatin and Praluent injection. Stated he is due for injection today is going to hold that and the pravastatin. Please advise.

## 2022-04-27 NOTE — Unmapped (Signed)
-----   Message from Karl Ito, CMA sent at 04/27/2022  9:48 AM EST -----  Regarding: Medication Concerns  Patient states that he has concerns of his cholesterol having same issues as before. Requesting call back.       [phone]

## 2022-04-28 ENCOUNTER — Ambulatory Visit: Admit: 2022-04-28 | Discharge: 2022-04-29 | Payer: MEDICARE

## 2022-04-28 LAB — COMPREHENSIVE METABOLIC PANEL
ALBUMIN: 3.6 g/dL (ref 3.4–5.0)
ALKALINE PHOSPHATASE: 94 U/L (ref 46–116)
ALT (SGPT): <7 U/L — ABNORMAL LOW (ref 10–49)
ANION GAP: 7 mmol/L (ref 5–14)
AST (SGOT): 14 U/L (ref <=34)
BILIRUBIN TOTAL: 0.6 mg/dL (ref 0.3–1.2)
BLOOD UREA NITROGEN: 15 mg/dL (ref 9–23)
BUN / CREAT RATIO: 15
CALCIUM: 10.1 mg/dL (ref 8.7–10.4)
CHLORIDE: 104 mmol/L (ref 98–107)
CO2: 27 mmol/L (ref 20.0–31.0)
CREATININE: 0.98 mg/dL
EGFR CKD-EPI (2021) MALE: 81 mL/min/{1.73_m2} (ref >=60)
GLUCOSE RANDOM: 180 mg/dL — ABNORMAL HIGH (ref 70–179)
POTASSIUM: 4.4 mmol/L (ref 3.4–4.8)
PROTEIN TOTAL: 8.3 g/dL — ABNORMAL HIGH (ref 5.7–8.2)
SODIUM: 138 mmol/L (ref 135–145)

## 2022-04-28 LAB — CBC W/ AUTO DIFF
BASOPHILS ABSOLUTE COUNT: 0 10*9/L (ref 0.0–0.1)
BASOPHILS RELATIVE PERCENT: 0.3 %
EOSINOPHILS ABSOLUTE COUNT: 0.2 10*9/L (ref 0.0–0.5)
EOSINOPHILS RELATIVE PERCENT: 1.6 %
HEMATOCRIT: 36.6 % — ABNORMAL LOW (ref 39.0–48.0)
HEMOGLOBIN: 12.3 g/dL — ABNORMAL LOW (ref 12.9–16.5)
LYMPHOCYTES ABSOLUTE COUNT: 1.5 10*9/L (ref 1.1–3.6)
LYMPHOCYTES RELATIVE PERCENT: 16 %
MEAN CORPUSCULAR HEMOGLOBIN CONC: 33.7 g/dL (ref 32.0–36.0)
MEAN CORPUSCULAR HEMOGLOBIN: 29.8 pg (ref 25.9–32.4)
MEAN CORPUSCULAR VOLUME: 88.4 fL (ref 77.6–95.7)
MEAN PLATELET VOLUME: 7.9 fL (ref 6.8–10.7)
MONOCYTES ABSOLUTE COUNT: 0.8 10*9/L (ref 0.3–0.8)
MONOCYTES RELATIVE PERCENT: 7.9 %
NEUTROPHILS ABSOLUTE COUNT: 7.1 10*9/L (ref 1.8–7.8)
NEUTROPHILS RELATIVE PERCENT: 74.2 %
PLATELET COUNT: 225 10*9/L (ref 150–450)
RED BLOOD CELL COUNT: 4.14 10*12/L — ABNORMAL LOW (ref 4.26–5.60)
RED CELL DISTRIBUTION WIDTH: 14 % (ref 12.2–15.2)
WBC ADJUSTED: 9.6 10*9/L (ref 3.6–11.2)

## 2022-04-28 LAB — TSH: THYROID STIMULATING HORMONE: 1.279 u[IU]/mL (ref 0.550–4.780)

## 2022-04-28 LAB — CK: CREATINE KINASE TOTAL: 144 U/L

## 2022-04-28 LAB — MAGNESIUM: MAGNESIUM: 1.5 mg/dL — ABNORMAL LOW (ref 1.6–2.6)

## 2022-04-28 LAB — C-REACTIVE PROTEIN: C-REACTIVE PROTEIN: 226 mg/L — ABNORMAL HIGH (ref <=10.0)

## 2022-04-28 LAB — SEDIMENTATION RATE: ERYTHROCYTE SEDIMENTATION RATE: 69 mm/h — ABNORMAL HIGH (ref 0–20)

## 2022-04-28 MED ORDER — MAGNESIUM OXIDE 400 MG (241.3 MG MAGNESIUM) TABLET
ORAL_TABLET | Freq: Every day | ORAL | 0 refills | 30 days | Status: CP
Start: 2022-04-28 — End: 2023-04-28

## 2022-04-28 MED ORDER — PREDNISONE 10 MG TABLET
ORAL_TABLET | Freq: Every day | ORAL | 0 refills | 30 days | Status: CP
Start: 2022-04-28 — End: 2022-05-28

## 2022-04-28 NOTE — Unmapped (Signed)
LM for patient to return call re medication

## 2022-04-28 NOTE — Unmapped (Addendum)
ASSESSMENT/PLAN:    Problem List Items Addressed This Visit          Other    Muscle stiffness - Primary     - At this time, pt with profound muscle stiffness and pain.  Normally, a very active person; however, is unable at this time to do basic tasks.   - On exam, he has severely limited ROM and muscle pain with movement and palpation in UE and LE.   - I discussed with him timing of this.  He is not taking his statin and has not done so for weeks.  Has not had any trauma or injury.  Had a CT head WNL just a couple days ago at [facility].   - At this time, his onset and pattern of stiffness seems to be AI in nature. I am thinking PMR vs myositis as most likely.  As mentioned, no viral prodrome.  Does not have a hx that would make me think of rhabdo.  Does have dogs in the house, so Parvo could be remotely possible.  Given weather and being inside, less likely to have Tick borne illness.   - Will send a smattering of labs at this time.  Based on those, we will assess for treatment.  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   - RTC in 1 week.          Relevant Orders    Comprehensive Metabolic Panel (Completed)    CBC w/ Differential (Completed)    TSH (Completed)    Parvovirus B19 Antibody, IgG and IgM    Anti-Nuclear Antibody (ANA)    Extractable Nuclear Antigen Screen (ENA)    Sedimentation rate, manual (Completed)    C-reactive protein (Completed)    CK (Completed)    Magnesium Level (Completed)     Other Visit Diagnoses       Autoinflammatory syndrome, unspecified (CMS-HCC)        Relevant Medications    predniSONE (DELTASONE) 10 MG tablet    Other Relevant Orders    CBC w/ Differential (Completed)    Disorder of muscle, unspecified        Relevant Orders    TSH (Completed)          Total time spent with patient on discussion of labs and testing and management of sx: 45 min       ######## ADDENDUM #######  - Reviewed labs with patient.  We are going to start prednisone for presumed PMR at this point.  Given DM, we will initiate 15mg  daily dose.  Confirmed with patient that he did not have any new HA or temporal pain to r/o GCA.  We will also start mag ox 400mg  daily as well.     - Plan will be for him to stay on 15mg  daily prednisone until he sees Dr. Marchia Bond.  He should be feeling better by that time, but may need to have slightly longer.  After that, he should have assessment of his BG and decide whether or not his DM meds need titrating.  I would taper his prednisone as such:   - 15mg  daily for 4 weeks  - 12.5mg  daily for 4 weeks  - 10 mg daily for 4 weeks  - 7.5mg  for 3 weeks  - 5 mg for 3 weeks  - 2.5mg  for 3 weeks  - 1mg  for 3 weeks    He is  on PPI.  Does not have other risk factors that I can see for Bactrim prophy.  Plan relayed to Dr. Marchia Bond and PCP.      RTC: 1 week    -----------------------------------------------------------  SUBJECTIVE:  Chief Complaint   Patient presents with    Muscle Pain     All over       HPI:  Christopher [name] is a 72 y.o. male that presents to clinic today regarding the following issues:    Severe Muscle Pain: Pt is presenting for severe muscle pain.  This started on Monday.  Normally, he is a very active person and does not have issues with mobility or pain.  Noted to not be able to really get out of bed at that time.  Tried to function as best as he could.  On 1/3, pain became so bad that he called EMS.  Went to [facility].  They did a CT head (he had pain in the back of his head) and that was WNL.  Sent him home.  Since being home, he has been very limited in activity.  Severe, whole body pain.  Has taken muscle relaxers and hydrocodone with no relief.  Has not had anything like this ever happen.  They did not do any blood work at [facility] so wife would like some here.  He has had one fall because of the pain and weakness in his legs.     He has not had any trauma or injury.  Has not had any viral prodrome.  He had cardiac surgery about 1 month ago.  Has not had fevers or chills.  No n/v/d/c.  Has not been on a statin (this was prescribed, but he is not taking).  Has not had any other new medicines or changes in medicine.      ROS: Please see HPI.     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    Corneal abrasion 1975    steel in right eye with abrasion and removal    Cubital tunnel syndrome on left 01/16/2019    Depression 2016    Diabetes mellitus (CMS-HCC)     GERD (gastroesophageal reflux disease) 1982    Hypertension     Meningitis 12/25/2015    Mixed hyperlipidemia     Myocardial infarction (CMS-HCC) 1986    Obesity 1986    first back surgery    Peptic ulceration        PAST SURGICAL HISTORY:  Past Surgical History:   Procedure Laterality Date    BACK SURGERY      CARDIAC CATHETERIZATION      STENTS PLACEMENT     CARDIAC SURGERY  2005    stents    CORONARY STENT PLACEMENT      FOREIGN BODY REMOVAL Right about 20 years ago    HERNIA REPAIR      RIGHT INGUNIAL    JOINT REPLACEMENT Left     TOTAL SHOULDER    PR ARM/ELBOW TENDON LENGTHEN,SINGLE,EA Left 02/17/2019    Procedure: TENDON LENGTHENING UPPER ARM/ELBOW SNGL EA;  Surgeon: Zane Herald  Jarold Motto, MD;  Location: ASC OR [facility];  Service: Orthopedics    PR CABG, ARTERIAL, SINGLE Midline 03/20/2022    Procedure: CORONARY ARTERY BYPASS, USING ARTERIAL GRAFT(S); SINGLE ARTERIAL GRAFT;  Surgeon: Arlester Marker, MD;  Location: MAIN OR [facility];  Service: Cardiac Surgery    PR CABG, ARTERY-VEIN, SINGLE Midline 03/20/2022    Procedure: CORONARY ARTERY BYPASS, USING VENOUS GRAFT(S) AND ARTERIAL GRAFT(S); SINGLE VEIN GRAFT;  Surgeon: Arlester Marker, MD;  [facility]: MAIN OR [facility];  Service: Cardiac Surgery    PR CATH PLACE/CORON ANGIO, IMG SUPER/INTERP,W LEFT HEART VENTRICULOGRAPHY N/A 02/01/2022    Procedure: Left Heart Catheterization;  Surgeon: Jacquelyne Balint, MD;  Location: [facility] CATH;  Service: Cardiology    PR ENDOSCOPY W/VIDEO-ASST VEIN HARVEST,CABG Right 03/20/2022    Procedure: ENDOSCOPY, SURGICAL, INCLUDING VIDEO-ASSISTED HARVEST OF VEIN(S) FOR CORONARY ARTERY BYPASS PROCEDURE;  Surgeon: Arlester Marker, MD;  Location: MAIN OR [facility];  Service: Cardiac Surgery    PR OSTEOTOMY 1ST METATARSAL,BASE/SHAFT Left 07/20/2017    Procedure: OSTEOTOMY, W/WO LENGTHENING, SHORTENING OR ANGULAR CORRECTION, METATARSAL; FIRST METATARSAL;  Surgeon: Valda Favia, MD;  Location: ASC OR [facility];  Service: Ortho Foot & Ankle    PR OSTEOTOMY HEEL BONE Left 07/20/2017    Procedure: OSTEOTOMYLoistine Chance W/WO INT FIXA;  Surgeon: Valda Favia, MD;  Location: ASC OR [facility];  Service: Ortho Foot & Ankle    PR OSTEOTOMY METATARSAL (NOT 1ST) Left 06/06/2018    Procedure: OSTEOTOMY, W/WO LENGTHENING, SHORTENING OR ANGULAR CORRECTION, METATARSAL; OTHER THAN FIRST METATARSAL, EA;  Surgeon: Valda Favia, MD;  Location: ASC OR [facility];  Service: Orthopedics    PR PART REMV PHALANX OF TOE Left 06/06/2018    Procedure: PART EXC BONE PHALANX TOE;  Surgeon: Valda Favia, MD;  Location: ASC OR [facility];  Service: Orthopedics    PR RECONSTR TOTAL SHOULDER IMPLANT Left 11/12/2018    Procedure: R28   ARTHROPLASTY, GLENOHUMERAL JOINT; TOTAL SHOULDER(GLENOID & PROXIMAL HUMERAL REPLACEMENT(EG, TOTAL SHOULDER);  Surgeon: Nickola Major, MD;  Location: [facility] OR [facility];  Service: Orthopedics    PR REMOVAL IMPLANT DEEP Left 11/12/2018    Procedure: REMOVE IMPLANT; DEEP SHOULDER;  Surgeon: Nickola Major, MD;  Location: [facility] OR [facility];  Service: Orthopedics    PR REPAIR BICEPS LONG TENDON Left 11/12/2018    Procedure: TENODESIS LONG TENDON BICEPS;  Surgeon: Nickola Major, MD;  Location: [facility] OR [facility];  Service: Orthopedics    PR REPAIR PERONEAL TENDONS,FIB OSTEOTMY Left 07/20/2017    Procedure: REPR DISLOC PERONEAL TENDONS; W/FIB OSTEOTOMY;  Surgeon: Valda Favia, MD;  Location: ASC OR [facility];  Service: Ortho Foot & Ankle    PR REVISE MEDIAN N/CARPAL TUNNEL SURG Left 02/17/2019    Procedure: R22 NEUROPLASTY AND/OR TRANSPOSITION; MEDIAN NERVE AT CARPAL TUNNEL;  Surgeon: Daisy Lazar, MD;  Location: ASC OR [facility];  Service: Orthopedics    PR REVISE ULNAR NERVE AT ELBOW Left 02/17/2019    Procedure: NEUROPLASTY AND/OR TRANSPOSITION; ULNAR NERVE AT ELBOW;  Surgeon: Daisy Lazar, MD;  Location: ASC OR [facility];  Service: Orthopedics    SHOULDER SURGERY Bilateral     L TSA, R ROTATOR CUFF      SKIN BIOPSY      SPINE SURGERY  1986/1995/2000/2002    LUMBAR FUSION    stent         SOCIAL HISTORY:  Social History     Socioeconomic History    Marital status: Married  Spouse name: Stanton Kidney   Occupational History    Occupation: Insurance claims handler work   Tobacco Use    Smoking status: Never    Smokeless tobacco: Never    Tobacco comments:     never will   Vaping Use    Vaping Use: Never used   Substance and Sexual Activity    Alcohol use: No     Alcohol/week: 0.0 standard drinks of alcohol    Drug use: Not Currently    Sexual activity: Yes     Partners: Female   Other Topics Concern    Do you use sunscreen? No    Tanning bed use? No    Are you easily burned? Yes    Excessive sun exposure? Yes    Blistering sunburns? Yes    Exercise Yes    Living Situation No     Social Determinants of Health     Financial Resource Strain: Low Risk  (03/21/2022)    Overall Financial Resource Strain (CARDIA)     Difficulty of Paying Living Expenses: Not very hard   Food Insecurity: No Food Insecurity (03/21/2022)    Hunger Vital Sign     Worried About Running Out of Food in the Last Year: Never true     Ran Out of Food in the Last Year: Never true Transportation Needs: No Transportation Needs (03/21/2022)    PRAPARE - Therapist, art (Medical): No     Lack of Transportation (Non-Medical): No       FAMILY HISTORY:  Family History   Problem Relation Age of Onset    Diabetes Mother     Stroke Mother     Cataracts Mother     Hypertension Mother     Alcohol abuse Father     Cancer Father     Diabetes Father     Cataracts Father     Hypertension Father     Melanoma Father     Hypertension Maternal Grandmother     Cancer Maternal Grandmother     Hypertension Maternal Grandfather     Hypertension Paternal Grandmother     Cataracts Paternal Grandfather     Hypertension Paternal Grandfather     COPD Brother         moker and alcholic    Early death Brother     COPD Sister         was a smoker    Diabetes Sister     COPD Brother         moker    Diabetes Brother     Diabetes Sister     Kidney disease Maternal Aunt         died from kidney diease due to diabeties    No Known Problems Maternal Uncle     No Known Problems Paternal Aunt     No Known Problems Paternal Uncle     No Known Problems Other     Blindness Neg Hx     Thyroid disease Neg Hx     Macular degeneration Neg Hx     Glaucoma Neg Hx     Basal cell carcinoma Neg Hx     Squamous cell carcinoma Neg Hx     Anesthesia problems Neg Hx     Broken bones Neg Hx     Clotting disorder Neg Hx     Collagen disease Neg Hx     Dislocations Neg Hx     Fibromyalgia Neg Hx  Gout Neg Hx     Hemophilia Neg Hx     Osteoporosis Neg Hx     Rheumatologic disease Neg Hx     Scoliosis Neg Hx     Severe sprains Neg Hx     Sickle cell anemia Neg Hx     Spinal Compression Fracture Neg Hx        ___________________________________  CURRENT MEDS:  Current Outpatient Medications   Medication Sig Dispense Refill    alirocumab 75 mg/mL PnIj Inject the contents of 1 pen (75 mg) under the skin every fourteen (14) days. 2 mL 11    aspirin (ECOTRIN) 81 MG tablet Take 1 tablet (81 mg total) by mouth daily. 30 tablet 11    blood sugar diagnostic (GLUCOSE BLOOD) Strp Check blood sugar as directed once a day and for symptoms of high or low blood sugar. 100 strip 0    blood-glucose meter Misc Use as instructed. 1 each 0    clopidogreL (PLAVIX) 75 mg tablet TAKE 1 TABLET BY MOUTH DAILY 100 tablet 2    diclofenac sodium (VOLTAREN) 1 % gel Apply 2 g topically four (4) times a day. 100 g 0    docusate sodium (COLACE) 100 MG capsule Take 1 capsule (100 mg total) by mouth two (2) times a day. 60 capsule 0    fluticasone propionate (FLONASE) 50 mcg/actuation nasal spray 2 sprays into each nostril daily. 48 mL 3    gabapentin (NEURONTIN) 300 MG capsule Take 2 capsules (600 mg total) by mouth two (2) times a day. TAKE 1 CAPSULE BY MOUTH TWICE  DAILY 180 capsule 1    lancets Misc Check blood sugar as directed once a day and for symptoms of high or low blood sugar. 100 each 0    metFORMIN (GLUCOPHAGE) 1000 MG tablet Take 1 tablet (1,000 mg total) by mouth in the morning and 1 tablet (1,000 mg total) in the evening. Take with meals. 180 tablet 3    metoPROLOL tartrate (LOPRESSOR) 25 MG tablet Take 0.5 tablets (12.5 mg total) by mouth two (2) times a day. 90 tablet 3    omeprazole (PRILOSEC) 20 MG capsule Take 1 capsule (20 mg total) by mouth daily. 90 capsule 3    pravastatin (PRAVACHOL) 10 MG tablet Take 1 tablet (10 mg total) by mouth daily. 30 tablet 11    semaglutide (OZEMPIC) 0.25 mg or 0.5 mg (2 mg/3 mL) PnIj Inject 0.25 mg under the skin every seven (7) days for 28 days, THEN 0.5 mg every seven (7) days for 28 days. 6 mL 0    [START ON 05/19/2022] semaglutide (OZEMPIC) 1 mg/dose (4 mg/3 mL) PnIj injection Inject 1 mg under the skin every seven (7) days for 4 doses. 3 mL 0    sertraline (ZOLOFT) 100 MG tablet TAKE 1 TABLET BY MOUTH DAILY 90 tablet 2    tamsulosin (FLOMAX) 0.4 mg capsule Take 1 capsule (0.4 mg total) by mouth daily. (Patient taking differently: Take 1 capsule (0.4 mg total) by mouth nightly.) 90 capsule 3    traZODone (DESYREL) 150 MG tablet TAKE 1 TABLET BY MOUTH AT NIGHT 90 tablet 2    trospium (SANCTURA) 20 mg tablet Take 1 tablet (20 mg total) by mouth nightly. (Patient taking differently: Take 1 tablet (20 mg total) by mouth in the morning.) 90 tablet 3    predniSONE (DELTASONE) 10 MG tablet Take 1.5 tablets (15 mg total) by mouth daily. 45 tablet 0     No  current facility-administered medications for this visit.       ___________________________________  ALLERGIES:  Allergies   Allergen Reactions    Morphine Hives    Atorvastatin      neuropathy    Atorvastatin Calcium      neuropathy    Baclofen      headache  headache    Ibuprofen      GI bleed    Pneumococcal 23-Valent Polysaccharide Vaccine Other (See Comments)     Redness and pain at injection site. Fatigue  Pt stated his arm swelled within 24hrs after injection    Zetia [Ezetimibe] Other (See Comments)     Pt states it effected his brain  Pt stated it effected his sight, I couldn't drive a car at all     -----------------------------------------------------------  OBJECTIVE:  Physical Exam:  VITALS:   Vitals:    04/28/22 1035   BP: 151/87   Pulse: 107   Temp:     Wt:   Wt Readings from Last 3 Encounters:   04/11/22 80.8 kg (178 lb 1.6 oz)   04/06/22 79.3 kg (174 lb 12.8 oz)   03/27/22 82.2 kg (181 lb 3.5 oz)       GEN: Sitting in wheelchair.  Looks to be exhausted and in pain.    HENT: Normocephalic; Atraumatic  EYES: EOMI; Sclera Clear  Heart: RRR  Lungs: CTAB  MSK: not able to lift arms more than 30 degrees without pain and stiffness.  Does have diffuse muscle tenderness noted as well.    Neuro: Grip strength is 4/5 bilaterally, but that was very challenging and painful for him to do.          LABS:  Office Visit on 04/28/2022   Component Date Value Ref Range Status    Sodium 04/28/2022 138  135 - 145 mmol/L Final    Potassium 04/28/2022 4.4  3.4 - 4.8 mmol/L Final    Chloride 04/28/2022 104  98 - 107 mmol/L Final    CO2 04/28/2022 27.0  20.0 - 31.0 mmol/L Final Anion Gap 04/28/2022 7  5 - 14 mmol/L Final    BUN 04/28/2022 15  9 - 23 mg/dL Final    Creatinine 29/56/2130 0.98  0.73 - 1.18 mg/dL Final    BUN/Creatinine Ratio 04/28/2022 15   Final    eGFR CKD-EPI (2021) Male 04/28/2022 81  >=60 mL/min/1.17m2 Final    Glucose 04/28/2022 180 (H)  70 - 179 mg/dL Final    Calcium 86/57/8469 10.1  8.7 - 10.4 mg/dL Final    Albumin 62/95/2841 3.6  3.4 - 5.0 g/dL Final    Total Protein 04/28/2022 8.3 (H)  5.7 - 8.2 g/dL Final    Total Bilirubin 04/28/2022 0.6  0.3 - 1.2 mg/dL Final    AST 32/44/0102 14  <=34 U/L Final    ALT 04/28/2022 <7 (L)  10 - 49 U/L Final    Alkaline Phosphatase 04/28/2022 94  46 - 116 U/L Final    TSH 04/28/2022 1.279  0.550 - 4.780 uIU/mL Final    Sed Rate 04/28/2022 69 (H)  0 - 20 mm/h Final    CRP 04/28/2022 226.0 (H)  <=10.0 mg/L Final    Creatine Kinase, Total 04/28/2022 144.0  46.0 - 171.0 U/L Final    Magnesium 04/28/2022 1.5 (L)  1.6 - 2.6 mg/dL Final    WBC 72/53/6644 9.6  3.6 - 11.2 10*9/L Final    RBC 04/28/2022 4.14 (L)  4.26 - 5.60 10*12/L Final  HGB 04/28/2022 12.3 (L)  12.9 - 16.5 g/dL Final    HCT 09/81/1914 36.6 (L)  39.0 - 48.0 % Final    MCV 04/28/2022 88.4  77.6 - 95.7 fL Final    MCH 04/28/2022 29.8  25.9 - 32.4 pg Final    MCHC 04/28/2022 33.7  32.0 - 36.0 g/dL Final    RDW 78/29/5621 14.0  12.2 - 15.2 % Final    MPV 04/28/2022 7.9  6.8 - 10.7 fL Final    Platelet 04/28/2022 225  150 - 450 10*9/L Final    Neutrophils % 04/28/2022 74.2  % Final    Lymphocytes % 04/28/2022 16.0  % Final    Monocytes % 04/28/2022 7.9  % Final    Eosinophils % 04/28/2022 1.6  % Final    Basophils % 04/28/2022 0.3  % Final    Absolute Neutrophils 04/28/2022 7.1  1.8 - 7.8 10*9/L Final    Absolute Lymphocytes 04/28/2022 1.5  1.1 - 3.6 10*9/L Final    Absolute Monocytes 04/28/2022 0.8  0.3 - 0.8 10*9/L Final    Absolute Eosinophils 04/28/2022 0.2  0.0 - 0.5 10*9/L Final    Absolute Basophils 04/28/2022 0.0  0.0 - 0.1 10*9/L Final       STUDIES:

## 2022-04-28 NOTE — Unmapped (Signed)
-   At this time, pt with profound muscle stiffness and pain.  Normally, a very active person; however, is unable at this time to do basic tasks.   - On exam, he has severely limited ROM and muscle pain with movement and palpation in UE and LE.   - I discussed with him timing of this.  He is not taking his statin and has not done so for weeks.  Has not had any trauma or injury.  Had a CT head WNL just a couple days ago at [facility].   - At this time, his onset and pattern of stiffness seems to be AI in nature. I am thinking PMR vs myositis as most likely.  As mentioned, no viral prodrome.  Does not have a hx that would make me think of rhabdo.  Does have dogs in the house, so Parvo could be remotely possible.  Given weather and being inside, less likely to have Tick borne illness.   - Will send a smattering of labs at this time.  Based on those, we will assess for treatment.  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   - RTC in 1 week.

## 2022-04-29 NOTE — Unmapped (Signed)
Addended by: Cherylann Parr on: 04/28/2022 04:05 PM     Modules accepted: Orders

## 2022-05-01 DIAGNOSIS — E1122 Type 2 diabetes mellitus with diabetic chronic kidney disease: Principal | ICD-10-CM

## 2022-05-01 DIAGNOSIS — N182 Chronic kidney disease, stage 2 (mild): Principal | ICD-10-CM

## 2022-05-01 LAB — EXTRACTABLE NUCLEAR ANTIGEN: ENA SCREEN: 0.1 ENA Units (ref <0.70)

## 2022-05-01 MED ORDER — METFORMIN 1,000 MG TABLET
ORAL_TABLET | 4 refills | 0 days | Status: CP
Start: 2022-05-01 — End: ?

## 2022-05-01 NOTE — Unmapped (Signed)
May 01, 2022 8:43 AM      Called and spoke to patient.  Doing much better with pain and movement since starting steroids on Friday.  Not back to 100% normal yet, but is able to now at least get up and down without pain.      Discussed plan via staff message as well with PCP and Dr. Marchia Bond who will see him in the coming days.

## 2022-05-02 LAB — ANA: ANTINUCLEAR ANTIBODIES (ANA): NEGATIVE

## 2022-05-02 NOTE — Unmapped (Signed)
Prior auth needed because the insurance is not covering it and it needs sent to Fax number [phone]. Please call  patient after at [phone].      Thanks   Delice Bison

## 2022-05-03 LAB — PARVOVIRUS B19 ANTIBODY, IGG AND IGM

## 2022-05-03 MED ORDER — TAMSULOSIN 0.4 MG CAPSULE
ORAL_CAPSULE | Freq: Every day | ORAL | 3 refills | 90 days | Status: CP
Start: 2022-05-03 — End: ?

## 2022-05-03 NOTE — Unmapped (Signed)
Refill sent.  .Sha-sha  Zori Benbrook, CMA

## 2022-05-03 NOTE — Unmapped (Signed)
Spoke to patient today he stated he is back taking both medication found out he has PMR feeling much better now.

## 2022-05-04 ENCOUNTER — Ambulatory Visit: Admit: 2022-05-04 | Discharge: 2022-05-05 | Payer: MEDICARE | Attending: Family Medicine | Primary: Family Medicine

## 2022-05-04 ENCOUNTER — Ambulatory Visit: Admit: 2022-05-04 | Discharge: 2022-05-05 | Payer: MEDICARE

## 2022-05-04 DIAGNOSIS — N182 Chronic kidney disease, stage 2 (mild): Principal | ICD-10-CM

## 2022-05-04 DIAGNOSIS — M353 Polymyalgia rheumatica: Principal | ICD-10-CM

## 2022-05-04 DIAGNOSIS — I152 Hypertension secondary to endocrine disorders: Principal | ICD-10-CM

## 2022-05-04 DIAGNOSIS — E1122 Type 2 diabetes mellitus with diabetic chronic kidney disease: Principal | ICD-10-CM

## 2022-05-04 DIAGNOSIS — E1159 Type 2 diabetes mellitus with other circulatory complications: Principal | ICD-10-CM

## 2022-05-04 MED ORDER — PREDNISONE 5 MG TABLET
ORAL_TABLET | Freq: Every day | ORAL | 0 refills | 30 days | Status: CP
Start: 2022-05-04 — End: 2022-06-03

## 2022-05-04 MED ORDER — TAMSULOSIN 0.4 MG CAPSULE
ORAL_CAPSULE | Freq: Every day | ORAL | 3 refills | 90 days | Status: CP
Start: 2022-05-04 — End: ?

## 2022-05-04 MED ORDER — AMLODIPINE 5 MG TABLET
ORAL_TABLET | Freq: Every day | ORAL | 3 refills | 90 days | Status: CP
Start: 2022-05-04 — End: ?

## 2022-05-04 NOTE — Unmapped (Signed)
Take the prednisone 12.5mg  daily (using the 5 mg tablets) for 1 month    THEN     Take the prednisone 10mg  daily (using the 10mg  tablets)

## 2022-05-04 NOTE — Unmapped (Signed)
ASSESSMENT/PLAN:    Problem List Items Addressed This Visit    None      Follow-up: ***      -----------------------------------------------------------  SUBJECTIVE:  Chief Complaint   Patient presents with    Follow-up       HPI:  See Assessment and Plan for HPI per problem    Seen on 1/5 for severe weakness. 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     Start amlodipine        ROS: Please see HPI.     -----------------------------------------------------------  OBJECTIVE:  Physical Exam:  VITALS:   Vitals:    05/04/22 0940   BP: (!) 171/111   Pulse: 98   Temp: 36.2 ??C (97.2 ??F)    Wt:   Wt Readings from Last 3 Encounters:   05/04/22 80.3 kg (177 lb)   04/11/22 80.8 kg (178 lb 1.6 oz)   04/06/22 79.3 kg (174 lb 12.8 oz)       GEN: Well-nourished, well-appearing, in no acute distress.   HEENT: Normocephalic; Atraumatic. Sclerae anicteric. Pupils PERRLA  CV: Regular rate and rhythm. No murmurs, rubs or gallops.   LUNGS: Clear to auscultation bilaterally, no wheezes, rhonchi or crackles.  ABD: Soft, non-distended, non-tender to palpation. Normal bowel tones.   EXT: No lower extremity edema  MSK: MS 5/5 in UE and LE b/l  NEURO: CN II-XII grossly intact; Normal gait      LABS:  Office Visit on 04/28/2022   Component Date Value Ref Range Status    Sodium 04/28/2022 138  135 - 145 mmol/L Final    Potassium 04/28/2022 4.4  3.4 - 4.8 mmol/L Final    Chloride 04/28/2022 104  98 - 107 mmol/L Final    CO2 04/28/2022 27.0  20.0 - 31.0 mmol/L Final    Anion Gap 04/28/2022 7  5 - 14 mmol/L Final    BUN 04/28/2022 15  9 - 23 mg/dL Final    Creatinine 16/01/9603 0.98  0.73 - 1.18 mg/dL Final    BUN/Creatinine Ratio 04/28/2022 15   Final    eGFR CKD-EPI (2021) Male 04/28/2022 81  >=60 mL/min/1.12m2 Final    Glucose 04/28/2022 180 (H)  70 - 179 mg/dL Final    Calcium 54/12/8117 10.1  8.7 - 10.4 mg/dL Final    Albumin 14/78/2956 3.6 3.4 - 5.0 g/dL Final    Total Protein 04/28/2022 8.3 (H)  5.7 - 8.2 g/dL Final    Total Bilirubin 04/28/2022 0.6  0.3 - 1.2 mg/dL Final    AST 21/30/8657 14  <=34 U/L Final    ALT 04/28/2022 <7 (L)  10 - 49 U/L Final    Alkaline Phosphatase 04/28/2022 94  46 - 116 U/L Final    TSH 04/28/2022 1.279  0.550 - 4.780 uIU/mL Final    Parvovirus B19 IgG 04/28/2022 Positive (A)  Negative Final    Parvovirus B19 IgM 04/28/2022 Negative  Negative Final    Parvovirus Interp 04/28/2022 SEE COMMENTS   Final    Antinuclear Antibodies (ANA) 04/28/2022 Negative  Negative Final    ENA Screen 04/28/2022 0.10  <0.70 ENA Units Final    Sed Rate 04/28/2022 69 (H)  0 - 20 mm/h Final    CRP 04/28/2022 226.0 (H)  <=10.0 mg/L Final    Creatine Kinase, Total 04/28/2022 144.0  46.0 - 171.0 U/L Final  Magnesium 04/28/2022 1.5 (L)  1.6 - 2.6 mg/dL Final    WBC 16/01/9603 9.6  3.6 - 11.2 10*9/L Final    RBC 04/28/2022 4.14 (L)  4.26 - 5.60 10*12/L Final    HGB 04/28/2022 12.3 (L)  12.9 - 16.5 g/dL Final    HCT 54/12/8117 36.6 (L)  39.0 - 48.0 % Final    MCV 04/28/2022 88.4  77.6 - 95.7 fL Final    MCH 04/28/2022 29.8  25.9 - 32.4 pg Final    MCHC 04/28/2022 33.7  32.0 - 36.0 g/dL Final    RDW 14/78/2956 14.0  12.2 - 15.2 % Final    MPV 04/28/2022 7.9  6.8 - 10.7 fL Final    Platelet 04/28/2022 225  150 - 450 10*9/L Final    Neutrophils % 04/28/2022 74.2  % Final    Lymphocytes % 04/28/2022 16.0  % Final    Monocytes % 04/28/2022 7.9  % Final    Eosinophils % 04/28/2022 1.6  % Final    Basophils % 04/28/2022 0.3  % Final    Absolute Neutrophils 04/28/2022 7.1  1.8 - 7.8 10*9/L Final    Absolute Lymphocytes 04/28/2022 1.5  1.1 - 3.6 10*9/L Final    Absolute Monocytes 04/28/2022 0.8  0.3 - 0.8 10*9/L Final    Absolute Eosinophils 04/28/2022 0.2  0.0 - 0.5 10*9/L Final    Absolute Basophils 04/28/2022 0.0  0.0 - 0.1 10*9/L Final       STUDIES: IgM 04/28/2022 Negative  Negative Final    Parvovirus Interp 04/28/2022 SEE COMMENTS   Final    Antinuclear Antibodies (ANA) 04/28/2022 Negative  Negative Final    ENA Screen 04/28/2022 0.10  <0.70 ENA Units Final    Sed Rate 04/28/2022 69 (H)  0 - 20 mm/h Final    CRP 04/28/2022 226.0 (H)  <=10.0 mg/L Final    Creatine Kinase, Total 04/28/2022 144.0  46.0 - 171.0 U/L Final    Magnesium 04/28/2022 1.5 (L)  1.6 - 2.6 mg/dL Final    WBC 21/30/8657 9.6  3.6 - 11.2 10*9/L Final    RBC 04/28/2022 4.14 (L)  4.26 - 5.60 10*12/L Final    HGB 04/28/2022 12.3 (L)  12.9 - 16.5 g/dL Final    HCT 84/69/6295 36.6 (L)  39.0 - 48.0 % Final    MCV 04/28/2022 88.4  77.6 - 95.7 fL Final    MCH 04/28/2022 29.8  25.9 - 32.4 pg Final    MCHC 04/28/2022 33.7  32.0 - 36.0 g/dL Final    RDW 28/41/3244 14.0  12.2 - 15.2 % Final    MPV 04/28/2022 7.9  6.8 - 10.7 fL Final    Platelet 04/28/2022 225  150 - 450 10*9/L Final    Neutrophils % 04/28/2022 74.2  % Final    Lymphocytes % 04/28/2022 16.0  % Final    Monocytes % 04/28/2022 7.9  % Final    Eosinophils % 04/28/2022 1.6  % Final    Basophils % 04/28/2022 0.3  % Final    Absolute Neutrophils 04/28/2022 7.1  1.8 - 7.8 10*9/L Final    Absolute Lymphocytes 04/28/2022 1.5  1.1 - 3.6 10*9/L Final    Absolute Monocytes 04/28/2022 0.8  0.3 - 0.8 10*9/L Final    Absolute Eosinophils 04/28/2022 0.2  0.0 - 0.5 10*9/L Final    Absolute Basophils 04/28/2022 0.0  0.0 - 0.1 10*9/L Final       STUDIES:

## 2022-05-04 NOTE — Unmapped (Signed)
Refill sent.  .Sha-sha  Ranya Fiddler, CMA

## 2022-05-05 DIAGNOSIS — F32A Depression, unspecified depression type: Principal | ICD-10-CM

## 2022-05-05 DIAGNOSIS — N182 Chronic kidney disease, stage 2 (mild): Principal | ICD-10-CM

## 2022-05-05 DIAGNOSIS — E1122 Type 2 diabetes mellitus with diabetic chronic kidney disease: Principal | ICD-10-CM

## 2022-05-05 MED ORDER — METFORMIN 1,000 MG TABLET
ORAL_TABLET | 1 refills | 0 days | Status: CP
Start: 2022-05-05 — End: ?

## 2022-05-05 MED ORDER — SERTRALINE 100 MG TABLET
ORAL_TABLET | Freq: Every day | ORAL | 1 refills | 90 days | Status: CP
Start: 2022-05-05 — End: ?

## 2022-05-05 MED ORDER — OMEPRAZOLE 20 MG CAPSULE,DELAYED RELEASE
ORAL_CAPSULE | Freq: Every day | ORAL | 1 refills | 90 days | Status: CP
Start: 2022-05-05 — End: ?

## 2022-05-05 NOTE — Unmapped (Signed)
BP elevated today and at home. Plan to resume amlodipine at 5mg . Pt amenable. Refill sent.

## 2022-05-05 NOTE — Unmapped (Signed)
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 Refilled ozempic. No adjustments today. Consider adjustments prn given prednisone taper.

## 2022-05-05 NOTE — Unmapped (Signed)
Diagnosed last week with profound fatige and weakness. Feels almost back to normal now. Plan for long taper. Refilled prednisone to take 12.5mg  for 4 weeks next. Then down to 10mg  for 4 weeks. Will have f/up with PCP in coming weeks. XR c spine to assess for etiology of pain though may be consistent with PMR resolving.

## 2022-05-06 MED ORDER — TROSPIUM 20 MG TABLET
ORAL_TABLET | Freq: Every evening | ORAL | 2 refills | 0 days
Start: 2022-05-06 — End: ?

## 2022-05-09 MED ORDER — CLOPIDOGREL 75 MG TABLET
ORAL_TABLET | Freq: Every day | ORAL | 2 refills | 100 days
Start: 2022-05-09 — End: ?

## 2022-05-09 MED ORDER — TROSPIUM 20 MG TABLET
ORAL_TABLET | Freq: Every evening | ORAL | 2 refills | 100 days | Status: CP
Start: 2022-05-09 — End: ?

## 2022-05-09 MED ORDER — PRAVASTATIN 10 MG TABLET
ORAL_TABLET | Freq: Every day | ORAL | 3 refills | 90 days | Status: CP
Start: 2022-05-09 — End: 2023-05-09

## 2022-05-09 NOTE — Unmapped (Signed)
Patient is requesting the following refill  Requested Prescriptions     Pending Prescriptions Disp Refills    clopidogrel (PLAVIX) 75 mg tablet 100 tablet 2     Sig: Take 1 tablet (75 mg total) by mouth daily.    pravastatin (PRAVACHOL) 10 MG tablet 90 tablet 3     Sig: Take 1 tablet (10 mg total) by mouth daily.       Recent Visits  Date Type Provider Dept   05/04/22 Office Visit Maisie Fus, MD [facility]   04/28/22 Office Visit Candie Chroman, [place] [facility] [place]   04/06/22 Office Visit Bernette Redbird, MD [facility] [place]   12/22/21 Office Visit Amada Kingfisher, MD [facility] [place]   08/09/21 Office Visit Amada Kingfisher, MD [place] [facility]   Showing recent visits within past 365 days with a meds authorizing provider and meeting all other requirements  Future Appointments  Date Type Provider Dept   06/06/22 Appointment Amada Kingfisher, MD [place] [facility]   Showing future appointments within next 365 days with a meds authorizing provider and meeting all other requirements       Labs: Cholesterol:   Cholesterol (mg/dL)   Date Value   09/60/4540 247 (H)   ,   Triglycerides (mg/dL)   Date Value   98/02/9146 307 (H)   ,   HDL (mg/dL)   Date Value   82/95/6213 48   ,   LDL Calculated (mg/dL)   Date Value   08/65/7846 138 (H)    Creatinine:   Creatinine (mg/dL)   Date Value   96/29/5284 0.98    Potassium:   Potassium (mmol/L)   Date Value   04/28/2022 4.4     Potassium, Bld (mmol/L)   Date Value   03/21/2022 4.6    Sodium:   Sodium (mmol/L)   Date Value   04/28/2022 138     Sodium Whole Blood (mmol/L)   Date Value   03/21/2022 137    Vitals:   BP Readings from Last 3 Encounters:   05/04/22 (!) 171/111   04/28/22 151/87   04/11/22 136/88    and   Pulse Readings from Last 3 Encounters:   05/04/22 98   04/28/22 107   04/11/22 86     Needs rx sent to new pharmacy [facility]

## 2022-05-10 NOTE — Unmapped (Signed)
Spoke to patient and informed to stop Plavix 1 month post CABG and to continue ASA daily indefinitely. V/u

## 2022-05-11 MED ORDER — BLOOD GLUCOSE TEST STRIPS
ORAL_STRIP | 2 refills | 0 days | Status: CP
Start: 2022-05-11 — End: ?

## 2022-05-11 MED ORDER — BLOOD-GLUCOSE METER KIT WRAPPER
0 refills | 0 days | Status: CP
Start: 2022-05-11 — End: 2023-05-11

## 2022-05-11 MED ORDER — LANCETS
3 refills | 0 days | Status: CP
Start: 2022-05-11 — End: 2023-05-11

## 2022-05-11 MED ORDER — TRUE METRIX LEVEL 1 SOLUTION
2 refills | 0 days | Status: CP
Start: 2022-05-11 — End: ?

## 2022-05-11 NOTE — Unmapped (Signed)
Hi Dr. Katrinka Blazing,    Received a fax from [facility] for refills on the Gabapentin.  There are 2 sets of directions on the Gabapentin, please advise which is correct.    Thanks, Eber Jones

## 2022-05-15 NOTE — Unmapped (Signed)
[facility] Refill Coordination Note    Vick Barriga [employer], [place]: [birth year]  Phone: [phone] (home)       All above HIPAA information was verified with patient.         05/11/2022    12:13 PM   Specialty Rx Medication Refill Questionnaire   Which Medications would you like refilled and shipped? praluent     i have one shot left to be taken on the 24 of january   Please list all current allergies: on my chart   Have you missed any doses in the last 30 days? No   Have you had any changes to your medication(s) since your last refill? No   How many days remaining of each medication do you have at home? 14 days   If receiving an injectable medication, next injection date is 05/17/2022   Have you experienced any side effects in the last 30 days? No   Please enter the full address (street address, city, state, zip code) where you would like your medication(s) to be delivered to. [address] [place] [address]   Please specify on which day you would like your medication(s) to arrive. Note: if you need your medication(s) within 3 days, please call the pharmacy to schedule your order at [phone]  05/29/2022   Has your insurance changed since your last refill? No   Would you like a pharmacist to call you to discuss your medication(s)? No   Do you require a signature for your package? (Note: if we are billing Medicare Part B or your order contains a controlled substance, we will require a signature) No         Completed refill call assessment today to schedule patient's medication shipment from the [facility] [phone]).  All relevant notes have been reviewed.       Confirmed patient received a Conservation officer, historic buildings and a Surveyor, mining with first shipment. The patient will receive a drug information handout for each medication shipped and additional FDA Medication Guides as required.         REFERRAL TO PHARMACIST     Referral to the pharmacist: Not needed      [facility]     Shipping address confirmed in Epic.     Delivery Scheduled: Yes, Expected medication delivery date: 05/26/22. Updated delivery date confirmed with pt via Mychart.     Medication will be delivered via UPS to the prescription address in Epic WAM.    Tobi Bastos, PharmD   [facility] Specialty Pharmacist

## 2022-05-16 MED ORDER — TAMSULOSIN 0.4 MG CAPSULE
ORAL_CAPSULE | Freq: Every day | ORAL | 3 refills | 90 days
Start: 2022-05-16 — End: ?

## 2022-05-16 NOTE — Unmapped (Signed)
Refill sent on 05/03/2022.  Drenda Freeze, CMA

## 2022-05-19 MED ORDER — OZEMPIC 1 MG/DOSE (4 MG/3 ML) SUBCUTANEOUS PEN INJECTOR
SUBCUTANEOUS | 0 refills | 28.00000 days | Status: CP
Start: 2022-05-19 — End: 2022-06-10

## 2022-05-22 NOTE — Unmapped (Deleted)
[place] [facility] ([facility]) [facility]  NO OUTREACH    Christopher [name] is a 72 y.o. male identified as possibly having proportion of days covered of less than 80%  for a diabetes medication  and hypertension medication, based on payer reports and chart review.    [place]  [place]    After chart and pharmacy claims review regarding the medication(s) below, patient outreach was not needed due to the following reason(s):         irbesartan (AVAPRO) 300 MG : Discontinued by Lorretta Harp, MD on 11/16/2021    metFORMIN (GLUCOPHAGE) 1000 MG : Discontinued by Miki Kins, LPN on 1/61/0960    semaglutide (OZEMPIC) 1 mg/dose (4 mg/3 mL): Discontinued by Maisie Fus, MD on 05/04/2022                Kennon Portela , CPhT  Certified Pharmacy Technician  [place] [facility]ations Program (CAMP)

## 2022-05-22 NOTE — Unmapped (Signed)
[place] [facility] ([facility]) [facility]  SUCCESSFUL ENGAGEMENT    Christopher [name] is a 72 y.o. male identified as possibly having a proportion of days covered of less than 80%  for quality measure(s), based on their performance during the previous measurement year. Contacted the Patient to review medication use history for the medication(s) described below.    [place]  [place]    Clinical Medication Adherence Assessment      Medication Assessed #1: METFORMIN (GLUCOPHAGE) 1000 MG   Chronic Disease State: Diabetes   Source of Data/Request for Medication Adherence Assessment: Payer Report   Does patient indicate they are taking their medication as prescribed?: Yes   Are prescription instructions in line with the way patient is currently taking this medication?: Yes   Medication Refills Remaining?: Yes   Do you currently receive a 90-day supply of this medication?: Yes   In general do you have any trouble obtaining or affording your medications?: No   Does the patient have any perceived side effects from this medication?: No         What other reasons can you think of that may have caused missed doses or gaps in medication fill history?: None   Reasons for Non-Adherence: Cost   Actions taken for this medication: Education Provided     Medication Assessed #2: IRBESARTAN (AVAPRO) 300 MG   Chronic Disease State: Hypertension   Source of Data/Request for Medication Adherence Assessment: Payer Report   Does patient indicate they are taking their medication as prescribed?: Yes   Are prescription instructions in line with the way patient is currently taking this medication?: Yes   Medication Refills Remaining?: Yes   Reasons for Non-Adherence: Discontinued by provider     Medication Assessed #3: SEMAGLUTIDE (OZEMPIC) 1 MG/DOSE (4 MG/3 ML)   Chronic Disease State: Diabetes     Source of Data/Request for Medication Adherence Assessment: Payer Report     Does patient indicate they are taking their medication as prescribed?: No     Are prescription instructions in line with the way patient is currently taking this medication?: No     Provider alerted to differences in prescription instructions: No     Medication Refills Remaining?: Yes     In general do you have any trouble obtaining or affording your medications?: Yes     What cost concerns do you have with this medication, if any?: Other     Would you like to schedule a visit with our pharmacist to further discuss your concerns?: No     Does the patient have any perceived side effects from this medication?: No     What other reasons can you think of that may have caused missed doses or gaps in medication fill history?: None     Reasons for Non-Adherence: Cost     Actions taken for this medication: None      General Medication Adherence: :   Population: Advertising copywriter [facility])   Contact Source: Patient   Who helps you with your medications?: Manage Myself   What reminder methods or adherence aids do you currently use to take your medications?: Keeping medication in visible location   Thinking about all of your medications, do you have any issues or concerns that we can further assist you with?: No      Would you be interested in discussing your medication concerns further through a scheduled virtual visit with a pharmacist?: No   Interventions Taken: Other, Referred to [facility]  Other Intervention taken: Patient states that he takes Metformin 1000 mg daily as prescribed , the irbesartan 300 mg was discontinued by his provider and the Ozempic he cannot afford .              Kennon Portela , CPhT  Certified Pharmacy Technician  [place] Assessment of Medications Program (CAMP)

## 2022-05-25 ENCOUNTER — Ambulatory Visit: Admit: 2022-05-25 | Discharge: 2022-05-26 | Payer: MEDICARE | Attending: Internal Medicine | Primary: Internal Medicine

## 2022-05-25 DIAGNOSIS — I240 Acute coronary thrombosis not resulting in myocardial infarction: Principal | ICD-10-CM

## 2022-05-25 DIAGNOSIS — E785 Hyperlipidemia, unspecified: Principal | ICD-10-CM

## 2022-05-25 DIAGNOSIS — E1159 Type 2 diabetes mellitus with other circulatory complications: Principal | ICD-10-CM

## 2022-05-25 DIAGNOSIS — I152 Hypertension secondary to endocrine disorders: Principal | ICD-10-CM

## 2022-05-25 DIAGNOSIS — E1122 Type 2 diabetes mellitus with diabetic chronic kidney disease: Principal | ICD-10-CM

## 2022-05-25 DIAGNOSIS — M353 Polymyalgia rheumatica: Principal | ICD-10-CM

## 2022-05-25 DIAGNOSIS — E1169 Type 2 diabetes mellitus with other specified complication: Principal | ICD-10-CM

## 2022-05-25 DIAGNOSIS — Z789 Other specified health status: Principal | ICD-10-CM

## 2022-05-25 DIAGNOSIS — Z951 Presence of aortocoronary bypass graft: Principal | ICD-10-CM

## 2022-05-25 DIAGNOSIS — N182 Chronic kidney disease, stage 2 (mild): Principal | ICD-10-CM

## 2022-05-25 DIAGNOSIS — I259 Chronic ischemic heart disease, unspecified: Principal | ICD-10-CM

## 2022-05-25 MED ORDER — METOPROLOL TARTRATE 25 MG TABLET
ORAL_TABLET | Freq: Two times a day (BID) | ORAL | 3 refills | 90 days | Status: CP
Start: 2022-05-25 — End: 2023-05-25

## 2022-05-25 MED ORDER — PREDNISONE 10 MG TABLET
ORAL_TABLET | Freq: Every day | ORAL | 3 refills | 30 days
Start: 2022-05-25 — End: ?

## 2022-05-25 MED ORDER — AMLODIPINE 10 MG TABLET
ORAL_TABLET | Freq: Every day | ORAL | 3 refills | 90 days | Status: CP
Start: 2022-05-25 — End: 2023-05-25

## 2022-05-25 MED FILL — PRALUENT PEN 75 MG/ML SUBCUTANEOUS PEN INJECTOR: SUBCUTANEOUS | 28 days supply | Qty: 2 | Fill #3

## 2022-05-25 NOTE — Unmapped (Signed)
[facility] CARDIOLOGY AT [facility]  [address] - Phone: [phone]  [place], [place] [address] - Fax: [phone]     Date of Service: 05/25/2022    PCP: Amada Kingfisher, MD  Referring Provider: Amada Kingfisher, MD    ASSESSMENT and PLAN:     Christopher [name] is a 72 y.o. male presenting at the request of Amada Kingfisher, MD for evaluation and management of:  1. Ischemic heart disease due to coronary artery obstruction (CMS-HCC)    2. S/P CABG (coronary artery bypass graft)    3. Hypertension associated with type 2 diabetes mellitus (CMS-HCC)    4. Hyperlipidemia associated with type 2 diabetes mellitus (CMS-HCC)    5. Statin intolerance    6. Type 2 diabetes mellitus with stage 2 chronic kidney disease, without long-term current use of insulin (CMS-HCC)      Christopher [name] presents in follow up after CABG in Nov 2023. This was completed without complication. He spent ~7 days in the hospital. He feels great. No angina. Incision well healed. [place] is [place] and thinks he gets plenty of exercise. HE does not wish to do cardiac rehab. EF prior to CABG normal. We reviewed medications. Imdur, metop, amlodipine were stopped/adjusted while hospitalized. He is now on Praluent for lipids/statin intolerance. Ozempic is too expensive.  For today recommend increasing amlodipine to 10mg  daily. He has also been taking metoprolol 25mg  BID instead of 12.5mg  BID as prescribed. He will continue that. He has also recently been diagnosed with PMR. He is now on prednisone.    1. Ischemic heart disease s/p PCI to LAD/D1 2004 with new angina 12/2021 => LHC showed 90% ostial LAD. Referred for CABG evaluation. S/p CABG 2V 02/2022 LIMA to LAD, RSVG to OM.  - aspirin 81mg  daily  - on amlodipine, metoprolol  - last echo: 02/2022 normal EF  - stress/angio: see below  - low dose statin, +praluent    2. HTN,   - continue amlodipine, metoprolol    3. TIA, presented 05/2020 to [facility] with dysarthria and right sided weakness. Symptoms now resolved  - aggressive RF modification (BP, HLD, DM)  - anti-platelets as above  - ziopatch no afib, 4% PVCs    4. Diabetes, not on insulin. Good control.   Lab Results   Component Value Date    A1C 6.7 04/06/2022     5. ED  - prescribed viagra with instructions for use re: nitroglycerin sl (hasn't needed in 2+ years)    6. PMR  - managed by his PCP. On prednisone    Follow-up: Return in about 2 months (around 07/24/2022).    Lorretta Harp, MD, PhD  [facility] Cardiology      ORDERS THIS VISIT:     No orders of the defined types were placed in this encounter.     New Prescriptions    No medications on file      Modified Medications    Modified Medication Previous Medication    AMLODIPINE (NORVASC) 10 MG TABLET amlodipine (NORVASC) 5 MG tablet       Take 1 tablet (10 mg total) by mouth daily. For high blood pressure    Take 1 tablet (5 mg total) by mouth daily. For high blood pressure    METOPROLOL TARTRATE (LOPRESSOR) 25 MG TABLET metoPROLOL tartrate (LOPRESSOR) 25 MG tablet       Take 1 tablet (25 mg total) by mouth two (  2) times a day.    Take 0.5 tablets (12.5 mg total) by mouth two (2) times a day.      Discontinued Medications    SEMAGLUTIDE (OZEMPIC) 0.25 MG OR 0.5 MG (2 MG/3 ML) PNIJ    Inject 0.25 mg under the skin every seven (7) days for 28 days, THEN 0.5 mg every seven (7) days for 28 days.    SEMAGLUTIDE (OZEMPIC) 1 MG/DOSE (4 MG/3 ML) PNIJ INJECTION    Inject 1 mg under the skin every seven (7) days for 4 doses.        SUBJECTIVE:     ID: Christopher [name] is a 72 y.o. male with a history of HTN, DM, CAD s/p PCI    Reason for Visit: Routine Follow-up (6 month checkup)     Cardiovascular History:    History of PCI in 2004. Presented to [facility] 06/10/20 with dysarthria and right sided weakness. Diagnosed with TIA. Carotid U/S showing mild carotid disease.     04/2021 referred to [facility] ER for severe episode of chest pain with subsequent new fatigue/SOB. Troponin negative x2.    9-01/2022 accelerating angina. LHC showed 90% ostial LAD with patent proximal LAD stent. Referred for CABG evaluation.     02/2022 [place]. CABG 2V    04/2022 diagnosed with PMR    Interval History:    Angina totally gone. Has some mild incisional discomfort.    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 Walks ~75miles a day.    BP at home 130s/80s    Taking praluent. Ozempic too expensive.    Now on prednisone for PMR.      Review of Systems  10 systems were reviewed and negative except as noted in HPI.    Cardiovascular History and Pertinent Past Medical History:  CAD  NSTEMI in 2004 s/p PCI to LAD/D1  TIA 05/2020  HTN  DM  Statin intolerance  PMR 04/2022     has a past medical history of Acute kidney injury (CMS-HCC) (01/12/2018), Anemia, Anxiety (dont know), Arthritis, Basal cell carcinoma, Cataract, CHF (congestive heart failure) (CMS-HCC), Chronic gout of right knee (12/22/2021), Corneal abrasion (1975), Cubital tunnel syndrome on left (01/16/2019), Depression (2016), Diabetes mellitus (CMS-HCC), GERD (gastroesophageal reflux disease) (1982), Hypertension, Meningitis (12/25/2015), Mixed hyperlipidemia, Myocardial infarction (CMS-HCC) (1986), Obesity (1986), and Peptic ulceration.    Prior Cardiovascular Interventions / Surgery:  NSTEMI in 2004 s/p PCI to LAD/D1    CABG  LIMA to LAD, RSVG to OM.  03/20/22     Prior Cardiovascular Diagnostics:    ECG   05/11/21 sinus rhythm, PRWP ?anterior infarct new from last tracing 05/2020  01/31/22 sinus rhythm, PRWP ?lead placement, NS TWA new from prior  02/21/22 sinus rhythm, NS TWA, PRWP    Ziopatch 06/10/20 - 4% PVC burden. Otherwise, unremarkable 14 day ambulatory monitor.     Echo 05/2021, normal EF  Echo 01/2022 EF 50-55%, normal RV  Echo 02/2022 normal EF, post CABG    SPECT [facility] 2017  - Low risk study  - There is a small in size, mild in severity, partially reversible defect involving the mid inferior and mid inferolateral segments. This is consistent with probable artifact (diaphragmatic attenuation) and cannot rule out mild ischemia.  - Post stress: Global systolic function is low normal. The ejection fraction calculated at 51%.     Carotid U/S 05/2020  1. Small amount of atherosclerotic plaque at the carotid bulbs  bilaterally.  2. No hemodynamically significant stenosis of  the extracranial  portions of the internal carotid arteries.  3. Antegrade flow within the vertebral arteries.    LHC 01/2022  - 90% ostial LAD stenosis  - Moderate diffuse CAD  - Patent stent in proximal LAD     Other Surgical History:   has a past surgical history that includes Hernia repair; stent; Foreign Body Removal (Right, about 20 years ago); Cardiac catheterization; Coronary stent placement; Back surgery; Spine surgery (1986/1995/2000/2002); Cardiac surgery (2005); pr repair peroneal tendons,fib osteotmy (Left, 07/20/2017); pr osteotomy heel bone (Left, 07/20/2017); pr osteotomy 1st metatarsal,base/shaft (Left, 07/20/2017); Skin biopsy; pr osteotomy metatarsal (not 1st) (Left, 06/06/2018); pr part remv phalanx of toe (Left, 06/06/2018); pr reconstr total shoulder implant (Left, 11/12/2018); pr removal implant deep (Left, 11/12/2018); pr repair biceps long tendon (Left, 11/12/2018); pr revise median n/carpal tunnel surg (Left, 02/17/2019); pr arm/elbow tendon lengthen,single,ea (Left, 02/17/2019); pr revise ulnar nerve at elbow (Left, 02/17/2019); Joint replacement (Left); Shoulder surgery (Bilateral); pr cath place/coron angio, img super/interp,w left heart ventriculography (N/A, 02/01/2022); pr cabg, arterial, single (Midline, 03/20/2022); pr endoscopy w/video-asst vein harvest,cabg (Right, 03/20/2022); and pr cabg, artery-vein, single (Midline, 03/20/2022).    Current Outpatient Medications   Medication Instructions    alirocumab 75 mg/mL PnIj Inject the contents of 1 pen (75 mg) under the skin every fourteen (14) days.    amlodipine (NORVASC) 10 mg, Oral, Daily (standard), For high blood pressure aspirin (ECOTRIN) 81 mg, Oral, Daily (standard)    blood glucose control, low (TRUE METRIX LEVEL 1) Soln For glucometer machine    blood sugar diagnostic (GLUCOSE BLOOD) Strp Check blood sugar as directed once a day and for symptoms of high or low blood sugar.    blood-glucose meter kit Use as instructed.    clopidogrel (PLAVIX) 75 mg, Oral, Daily (standard)    diclofenac sodium (VOLTAREN) 2 g, Topical, 4 times a day    docusate sodium (COLACE) 100 mg, Oral, 2 times a day (standard)    fluticasone propionate (FLONASE) 50 mcg/actuation nasal spray 2 sprays, Each Nare, Daily (standard)    gabapentin (NEURONTIN) 600 mg, Oral, 2 times a day (standard), TAKE 1 CAPSULE BY MOUTH TWICE  DAILY    lancets Misc Disp. lancets #100 or amount allowed, Testing Qday. Dx: E11.9 (Diabetes- controlled)    magnesium oxide (MAGOX) 400 mg, Oral, Daily (standard)    metFORMIN (GLUCOPHAGE) 1000 MG tablet Take 1 tablet by mouth in the morning and 1 tablet in the evening with meals.    metoPROLOL tartrate (LOPRESSOR) 25 mg, Oral, 2 times a day (standard)    omeprazole (PRILOSEC) 20 mg, Oral, Daily (standard)    pravastatin (PRAVACHOL) 10 mg, Oral, Daily (standard)    predniSONE (DELTASONE) 12.5 mg, Oral, Daily (standard)    [START ON 06/02/2022] predniSONE (DELTASONE) 10 mg, Oral, Daily (standard)    sertraline (ZOLOFT) 100 mg, Oral, Daily (standard)    tamsulosin (FLOMAX) 0.4 mg, Oral, Daily (standard)    traZODone (DESYREL) 150 mg, Oral, Nightly    trospium (SANCTURA) 20 mg, Oral, Nightly        Allergies:  is allergic to morphine, atorvastatin, atorvastatin calcium, baclofen, ibuprofen, pneumococcal 23-valent polysaccharide vaccine, and zetia [ezetimibe].    Social History:  He  reports that he has never smoked. He has never used smokeless tobacco. He reports that he does not currently use drugs. He reports that he does not drink alcohol.    Family History:  His family history includes Alcohol abuse in his father; COPD in his brother,  brother, and sister; Cancer in his father and maternal grandmother; Cataracts in his father, mother, and paternal grandfather; Diabetes in his brother, father, mother, sister, and sister; Early death in his brother; Hypertension in his father, maternal grandfather, maternal grandmother, mother, paternal grandfather, and paternal grandmother; Kidney disease in his maternal aunt; Melanoma in his father; No Known Problems in his maternal uncle, paternal aunt, paternal uncle, and another family member; Stroke in his mother.       OBJECTIVE:      BP 140/84 (BP Site: L Arm, BP Position: Sitting, BP Cuff Size: Medium)  - Pulse 67  - Temp 35.9 ??C (96.7 ??F) (Temporal)  - Resp 18  - Ht 165.1 cm (5' 5)  - Wt 80.7 kg (178 lb)  - SpO2 95%  - BMI 29.62 kg/m??    Wt Readings from Last 3 Encounters:   05/25/22 80.7 kg (178 lb)   05/04/22 80.3 kg (177 lb)   04/11/22 80.8 kg (178 lb 1.6 oz)     BP Readings from Last 5 Encounters:   05/25/22 140/84   05/04/22 (!) 171/111   04/28/22 151/87   04/11/22 136/88   04/06/22 108/75     Pulse Readings from Last 3 Encounters:   05/25/22 67   05/04/22 98   04/28/22 107       General:  Alert, no distress.   HEENT:  EOMI, sclerae anicteric   Neck: No carotid bruit. JVP is not visible sitting upright   Lungs:   CTAB bilaterally with normal WOB.   CV:  RRR, normal s1 and s2, no murmurs, normal radial pulses b/l   Abdomen:   Soft, non-tender   Extremities: No LE edema   Skin: No lesions/rashes.   Neurologic: No focal deficits.     Lab Results   Component Value Date    HGB 12.3 (L) 04/28/2022    HGB 9.4 (L) 03/27/2022    HGB 9.7 (L) 03/26/2022    PLT 225 04/28/2022    PLT 212 03/27/2022    PLT 193 03/26/2022     Lab Results   Component Value Date    CREATININE 0.98 04/28/2022    CREATININE 1.00 03/27/2022    CREATININE 0.89 03/26/2022    K 4.4 04/28/2022    K 4.2 03/27/2022    K 4.1 03/26/2022      No results found for: BNP  Lab Results   Component Value Date    PROBNP 155.0 01/11/2018     Lab Results   Component Value Date    CHOL 247 (H) 11/18/2021    CHOL 214 (H) 08/29/2017    LDL 138 (H) 11/18/2021    LDL 91 08/29/2017    HDL 48 11/18/2021    HDL 53 08/29/2017    TRIG 161 (H) 11/18/2021    TRIG 349 (H) 08/29/2017     Lab Results   Component Value Date    A1C 6.7 04/06/2022

## 2022-05-25 NOTE — Unmapped (Signed)
I am glad you are doing so well. Lets catch up in 2 months.     INCREASE amlodipine to 10mg  daily    Lorretta Harp, MD, PhD  [facility]  Cardiology

## 2022-06-02 MED ORDER — PREDNISONE 10 MG TABLET
ORAL_TABLET | Freq: Every day | ORAL | 0 refills | 30 days | Status: CP
Start: 2022-06-02 — End: 2022-07-02

## 2022-06-06 ENCOUNTER — Ambulatory Visit: Admit: 2022-06-06 | Discharge: 2022-06-07 | Payer: MEDICARE

## 2022-06-06 DIAGNOSIS — M353 Polymyalgia rheumatica: Principal | ICD-10-CM

## 2022-06-06 DIAGNOSIS — E1122 Type 2 diabetes mellitus with diabetic chronic kidney disease: Principal | ICD-10-CM

## 2022-06-06 DIAGNOSIS — E1159 Type 2 diabetes mellitus with other circulatory complications: Principal | ICD-10-CM

## 2022-06-06 DIAGNOSIS — I152 Hypertension secondary to endocrine disorders: Principal | ICD-10-CM

## 2022-06-06 DIAGNOSIS — N182 Chronic kidney disease, stage 2 (mild): Principal | ICD-10-CM

## 2022-06-06 LAB — HEMOGLOBIN A1C
ESTIMATED AVERAGE GLUCOSE: 166 mg/dL
HEMOGLOBIN A1C: 7.4 % — ABNORMAL HIGH (ref 4.8–5.6)

## 2022-06-06 NOTE — Unmapped (Signed)
Diagnosed January 2024 with profound fatige and weakness. Feels almost back to normal now. Plan for long taper. Now taking Prednisone 10 mg once daily. Counseled to take first thing in the morning to help with sleep. FU 4 weeks virtual visit.    Taper as below:  - 15mg  daily for 4 weeks   - 12.5mg  daily for 4 weeks   - 10 mg daily for 4 weeks   - 7.5mg  for 4 weeks   - 5 mg for 4 weeks   - 2.5mg  for 4 weeks   - 1mg  for 4 weeks

## 2022-06-06 NOTE — Unmapped (Addendum)
DIABETES PLAN     Dm goal is <8.0%  Lab Results   Component Value Date    A1C 6.7 04/06/2022    A1C 7.9 (H) 12/22/2021    A1C 7.8 (H) 08/09/2021   Repeat HbA1c pending.    BP goal is <130/90  BP Readings from Last 3 Encounters:   06/06/22 142/91   05/25/22 140/84   05/04/22 (!) 171/111       The 10-yr ASCVD Risk score Denman George DC Jr., et al., 2013) failed to calculate due to the following reason:  The 2013 10-yr ASCVD risk score is only valid if the patient does not have prior/existing clinical ASCVD (myocardial infarction, stroke, CABG, coronary angioplasty, angina or peripheral arterial disease, coronary atherosclerosis, ischemic heart disease, or cerebrovascular disease). The patient has prior/existing ASCVD.  Had Coronary Angioplasty  Has Angina  Has Coronary Atherosclerosis      Current Diabetes Medications: Metformin 1000 mg PO BID     Today's Recommendations:  Continue Metformin 1000 mg BID. Unable to afford Ozempic.    Continue baby aspirin and pravastatin.    Recommend continued regular exercise as tolerated.  Recommend avoiding carbohydrate rich foods and moderation.    Diabetes Quality Metrics    Metric Comments   Most Recent Foot Exam Date: 04/06/2022    Eye Exam date: 01/03/2022    Prescribed aspirin: Yes    Prescribed ACE inhibitor: No OR Prescribed ARBs: No    Prescribed statins: Yes    Pneumonia vaccination: 12/07/2015    Smokes Tobacco: No      Most Recent Diabetes Labs  Lab Results   Component Value Date    LDL 138 (H) 11/18/2021    ALBCRERATIO 3.7 11/28/2016    NA 138 04/28/2022    K 4.4 04/28/2022    CREATININE 0.98 04/28/2022

## 2022-06-06 NOTE — Unmapped (Signed)
Family Medicine Office Visit    Assessment/Plan    Problem List Items Addressed This Visit          Endocrine    Hypertension associated with type 2 diabetes mellitus (CMS-HCC)     Not well controlled today, reports he took his BP medication about 1 hour PTA.  - Continue Amlodipine 10 mg and Metop 25 mg BID.  - Will recheck at follow-up with cards, but may benefit from increasing metop v initiating new anti-HTN.           Type 2 diabetes mellitus with stage 2 chronic kidney disease, without long-term current use of insulin (CMS-HCC)     DIABETES PLAN     Dm goal is <8.0%  Lab Results   Component Value Date    A1C 6.7 04/06/2022    A1C 7.9 (H) 12/22/2021    A1C 7.8 (H) 08/09/2021   Repeat HbA1c pending.    BP goal is <130/90  BP Readings from Last 3 Encounters:   06/06/22 142/91   05/25/22 140/84   05/04/22 (!) 171/111       The 10-yr ASCVD Risk score Denman George DC Jr., et al., 2013) failed to calculate due to the following reason:  The 2013 10-yr ASCVD risk score is only valid if the patient does not have prior/existing clinical ASCVD (myocardial infarction, stroke, CABG, coronary angioplasty, angina or peripheral arterial disease, coronary atherosclerosis, ischemic heart disease, or cerebrovascular disease). The patient has prior/existing ASCVD.  Had Coronary Angioplasty  Has Angina  Has Coronary Atherosclerosis      Current Diabetes Medications: Metformin 1000 mg PO BID     Today's Recommendations:  Continue Metformin 1000 mg BID. Unable to afford Ozempic.    Continue baby aspirin and pravastatin.    Recommend continued regular exercise as tolerated.  Recommend avoiding carbohydrate rich foods and moderation.    Diabetes Quality Metrics    Metric Comments   Most Recent Foot Exam Date: 04/06/2022    Eye Exam date: 01/03/2022    Prescribed aspirin: Yes    Prescribed ACE inhibitor: No OR Prescribed ARBs: No    Prescribed statins: Yes    Pneumonia vaccination: 12/07/2015    Smokes Tobacco: No      Most Recent Diabetes Labs  Lab Results   Component Value Date    LDL 138 (H) 11/18/2021    ALBCRERATIO 3.7 11/28/2016    NA 138 04/28/2022    K 4.4 04/28/2022    CREATININE 0.98 04/28/2022            Relevant Orders    Hemoglobin A1c       Other    Polymyalgia rheumatica (CMS-HCC) - Primary     Diagnosed January 2024 with profound fatige and weakness. Feels almost back to normal now. Plan for long taper. Now taking Prednisone 10 mg once daily. Counseled to take first thing in the morning to help with sleep. FU 4 weeks virtual visit.    Taper as below:  - 15mg  daily for 4 weeks   - 12.5mg  daily for 4 weeks   - 10 mg daily for 4 weeks   - 7.5mg  for 4 weeks   - 5 mg for 4 weeks   - 2.5mg  for 4 weeks   - 1mg  for 4 weeks          Relevant Medications    predniSONE (DELTASONE) 2.5 MG tablet (Start on 07/03/2022)       # Screening and Prevention  -  Colorectal Screening:  Brought records from [place] (scanned) Colonoscopy performed 2015 - three 2-5 mm hyperplastic polyps in the sigmoid colon, internal hemorrhoids, repeat 10 years (2025).  - Hep C Ab:  Negative.  - HIV Ab:  Negative.    # Vaccines  - TDaP: 2017.  - pneumovax: 2016, 2017.  - seasonal influenza: Received.  - COVID:  Received.    I personally spent 35 minutes face-to-face and non-face-to-face in the care of this patient, which includes all pre, intra, and post visit time on the date of service.       Subjective:    Chief Complaint   Patient presents with    Follow-up     Check up        History of Present Illness  Christopher [name] is a 72 y.o. male who is here for follow-up.    Seen in [facility] 04/06/2022. Reported difficulty swallowing, referred to GI. Cancelled appointment with GI because symptoms resolved.    CAD: Underwent CABG 02/2022. Last visit with cardiology 05/25/2022. Plan for FU in 2 months (April 2024).    PMR: Diagnosed early 04/2022. Started on on prednisone taper, now on 10 mg once daily. Is interfering with sleep, thinks he is taking in the morning and at night.    Physical Exam:  Vitals Reviewed:  BP 142/91 (BP Site: L Arm, BP Position: Sitting, BP Cuff Size: Large) Comment: average bp - Pulse 82  - Temp 36.1 ??C (97 ??F) (Temporal)  - Ht 165.1 cm (5' 5)  - Wt 82.1 kg (181 lb)  - BMI 30.12 kg/m??    Weight:   Wt Readings from Last 3 Encounters:   06/06/22 82.1 kg (181 lb)   05/25/22 80.7 kg (178 lb)   05/04/22 80.3 kg (177 lb)     General: well-nourished, no acute distress, pleasant.

## 2022-06-06 NOTE — Unmapped (Signed)
Not well controlled today, reports he took his BP medication about 1 hour PTA.  - Continue Amlodipine 10 mg and Metop 25 mg BID.  - Will recheck at follow-up with cards, but may benefit from increasing metop v initiating new anti-HTN.

## 2022-06-12 DIAGNOSIS — M549 Dorsalgia, unspecified: Principal | ICD-10-CM

## 2022-06-12 DIAGNOSIS — G8929 Other chronic pain: Principal | ICD-10-CM

## 2022-06-12 MED ORDER — GABAPENTIN 300 MG CAPSULE
ORAL_CAPSULE | Freq: Two times a day (BID) | ORAL | 3 refills | 45 days | Status: CP
Start: 2022-06-12 — End: ?

## 2022-06-13 NOTE — Unmapped (Signed)
Gabapentin refill sent to [facility] pharmacy as requested. Called patient and notified him on the phone.     Sharlette Dense, RN

## 2022-06-13 NOTE — Unmapped (Signed)
Medication Request     Patient Name: Christopher [name].   Caller: Self (Patient)  Have you contacted your pharmacy? yes      Last Visit: 06/06/2022       Medication Name: gabapentin (NEURONTIN) 300 MG capsule   Dosage:300mg    Route: Oral (PO)  Frequency: Daily  Day Supply Requested: 30  Pharmacy (Name & Address): [facility]  Pharmacy Phone Number: [phone]

## 2022-06-28 DIAGNOSIS — M353 Polymyalgia rheumatica: Principal | ICD-10-CM

## 2022-06-28 MED ORDER — PREDNISONE 2.5 MG TABLET
ORAL_TABLET | 11 refills | 0 days
Start: 2022-06-28 — End: ?

## 2022-06-29 MED ORDER — PREDNISONE 2.5 MG TABLET
ORAL_TABLET | 11 refills | 0 days
Start: 2022-06-29 — End: ?

## 2022-06-29 NOTE — Unmapped (Signed)
[facility] Refill Coordination Note    Specialty Lite Medication(s) to be Shipped:   General Specialty: Praluent    Other medication(s) to be shipped: No additional medications requested for fill at this time     Christopher [name], DOB: [birth year]  Phone: [phone] (home)       All above HIPAA information was verified with patient.     Was a Nurse, learning disability used for this call? No    Changes to medications: Rawlin reports no changes at this time.  Changes to insurance: No      REFERRAL TO PHARMACIST     Referral to the pharmacist: Not needed      [facility]     Shipping address confirmed in Epic.     Delivery Scheduled: Yes, Expected medication delivery date: 07/13/22.     Medication will be delivered via UPS to the prescription address in Epic WAM.    Camillo Flaming, PharmD   [facility] Specialty Pharmacist

## 2022-07-03 MED ORDER — PREDNISONE 2.5 MG TABLET
ORAL_TABLET | Freq: Every day | ORAL | 0 refills | 30 days | Status: CP
Start: 2022-07-03 — End: 2023-07-03

## 2022-07-03 NOTE — Unmapped (Signed)
Video Visit Note  This medical encounter was conducted virtually using [email or web address]  TeleHealth protocols.    I have identified myself to the patient and conveyed my credentials to Christopher [name]  Patient/proxy has provided verbal consent to proceed with this video visit.  In case we get disconnected, [phone] (home)   In case of Emergency, patient's current location: { :[id number]}  Is there someone else in the room? { :66677}    Christopher [name] is a 72 y.o. male that presents for a video visit today regarding the following issues:    Assessment/Plan:      Problem List Items Addressed This Visit    None      Followup:  No follow-ups on file.    {    Coding tips - Do not edit this text, it will delete upon signing of note!    Telephone visits [phone] for Physicians and APPs and [phone] for Non- Physician Clinicians)- Only use minutes on the phone to determine level of service.    Video visits [phone]) - Use either level of medical decision making just as an in-person visit OR time which includes both minutes on video and pre/post minutes to determine the level of service.      :75688}  The patient reports they are physically located in [place] and is currently: {patient location:[address]90}. I conducted a {phone audio video visit:101857} .         Subjective:     HPI  Christopher [name] is a 72 y.o. male requesting a video visit to discuss the following issues:    ***    ROS  As per HPI.    HISTORY  I have reviewed the patient's problem list, current medications, and allergies and have updated/reconciled them as needed.    Objective:   Patient Reported Blood Pressure Readings    General: Well appearing , in no acute distress  Skin: Color is normal. No rashes.  Psych: Appropriate affect, normal mood  Resp: Normal work of breathing, no retractions list, current medications, and allergies and have updated/reconciled them as needed.    Objective:   Patient Reported Blood Pressure Readings    General: Well appearing , in no acute distress  Skin: Color is normal. No rashes.  Psych: Appropriate affect, normal mood  Resp: Normal work of breathing, no retractions

## 2022-07-04 ENCOUNTER — Telehealth: Admit: 2022-07-04 | Discharge: 2022-07-05 | Payer: MEDICARE

## 2022-07-04 DIAGNOSIS — M353 Polymyalgia rheumatica: Principal | ICD-10-CM

## 2022-07-04 MED ORDER — PREDNISONE 2.5 MG TABLET
ORAL_TABLET | 0 refills | 0 days | Status: CP
Start: 2022-07-04 — End: ?

## 2022-07-04 MED ORDER — PREDNISONE 1 MG TABLET
ORAL_TABLET | Freq: Every day | ORAL | 0 refills | 30 days | Status: CP
Start: 2022-07-04 — End: ?

## 2022-07-06 NOTE — Unmapped (Signed)
Medication Request     Patient Name: Christopher [name].   Caller: Self (Patient)  Have you contacted your pharmacy? yes      Last Visit: 06/06/2022       Medication Name: predniSONE (DELTASONE) 2.5 MG tablet [[id number]]   Dosage:2.5 mg  Route: Oral (PO)  Frequency: Daily  Day Supply Requested: 32  Pharmacy (Name & Address): [facility]   Pharmacy Phone Number:

## 2022-07-12 MED FILL — PRALUENT PEN 75 MG/ML SUBCUTANEOUS PEN INJECTOR: SUBCUTANEOUS | 84 days supply | Qty: 6 | Fill #4

## 2022-07-17 NOTE — Unmapped (Signed)
[facility] [place] POPULATION HEALTH  Care Management Progress Note    Date: 07/04/2022  Outcome:  Phone outreach completed    Purpose of contact:           Follow up on CCM management. CM discussed the following with the Pt:    Pt reports generally doing well, denied medical concerns or needs for resource coordination at this time.  Per ozempic, Pt reports side effects such as lower stomach spasm. Pt reports stopped taking about a month ago. Pt also reports discomfort with the bowel routine change since around the time taking ozempic.     Lab Results   Component Value Date    A1C 7.4 (H) 06/06/2022       Diabetes:  Experiencing symptoms of high blood sugar?: no  Experiencing symptoms of low blood sugar?: no  Know how to treat low blood sugar?: yes  Experiencing symptoms of numbness or tingling in fingers or toes?: no  Regularly checking blood sugars?: yes  If yes, when? before breakfast, 2 hours after breakfast  Typical daily diet: well balanced effort  Weekly physical activity: 30-60 mins    Past week: Highest - 130. Lowest 82. Usually average 108.     Educational points mentioned:  Goals: fasting blood glucose (in the morning before eating) is 80-130, and 2 hours after meals <180  Treating low blood sugars (the Rule of 15):   check blood sugar; if it is less than 70 it is considered hypoglycemic  treat with 15 grams of fasting acting carbohydrates like 1/2 cup of juice or regular soda, OR 3-4 glucose tablets and recheck blood sugar in 15 minutes  If it is back above 70, continue with routine as normal and eat a snack or a small meal  If it is still below 70, drink 1/2 cup more of juice or regular soda OR take 3-4 more glucose tablets and recheck again after 15 minutes  Diet: restrict carbs (bread, cereal, pasta) and sugary foods and drinks (especially soda and lemonade), as these will increase blood sugars. Many fruits (watermelon, pineapple, mangos, cherries, oranges, grapes, ripe bananas) will also increase blood sugar, even though they aren't traditional desserts.  Exercise: guidelines recommend 150 minutes of moderate-to-vigorous aerobic exercise weekly (walking, going to the gym) along with strength training 2-3 times per week  Be sure to be keeping a log of blood sugars and bring the log and/or glucometer to appointments  Be sure to discuss other diabetic-related health maintenance items: annual dilated eye exam, annual foot exam, annual dental exam, etc.    Barriers to Care:  N/A    Provider/Care Partner(s) to follow up on:   N/A    Health Maintenance:  Health Maintenance Due   Topic Date Due    Medicare Annual Wellness Visit (AWV)  Never done    Zoster Vaccines (2 of 3) 06/16/2014       Additional Information/Plan:  Patient provided my direct contact information and encouraged to contact me should additional needs arise.  Chart review performed prior to reaching out to patient.     Time Spent Per Day:  Chart review was completed prior to outreach attempt.   07/04/2022: 5  07/17/2022 : 20    Christopher [name]  [facility]  [facility] [place]

## 2022-07-25 MED ORDER — PREDNISONE 1 MG TABLET
ORAL_TABLET | Freq: Every day | ORAL | 6 refills | 30 days | Status: CP
Start: 2022-07-25 — End: ?

## 2022-08-21 ENCOUNTER — Ambulatory Visit: Admit: 2022-08-21 | Discharge: 2022-08-22 | Payer: MEDICARE

## 2022-08-21 DIAGNOSIS — R972 Elevated prostate specific antigen [PSA]: Principal | ICD-10-CM

## 2022-08-21 DIAGNOSIS — N39498 Other specified urinary incontinence: Principal | ICD-10-CM

## 2022-08-21 DIAGNOSIS — N400 Enlarged prostate without lower urinary tract symptoms: Principal | ICD-10-CM

## 2022-08-21 LAB — PSA: PROSTATE SPECIFIC ANTIGEN: 0.38 ng/mL (ref 0.00–4.00)

## 2022-08-21 MED ORDER — TROSPIUM 20 MG TABLET
ORAL_TABLET | Freq: Every evening | ORAL | 3 refills | 100 days | Status: CP
Start: 2022-08-21 — End: 2023-08-16

## 2022-08-21 MED ORDER — TAMSULOSIN 0.4 MG CAPSULE
ORAL_CAPSULE | Freq: Every day | ORAL | 3 refills | 90 days | Status: CP
Start: 2022-08-21 — End: ?

## 2022-08-21 NOTE — Unmapped (Signed)
Assessment:   Diagnosis ICD-10-CM Associated Orders   1. Other urinary incontinence / bph/ elevated psa N39.498 Urology   This is a 72 y.o. male with BPH, urge incontinence, worse at night with prior good response to vesicare, however, it contributed to dry mouth. Myrbetriq wasn't covered by insurance.   Now with great response to sanctura.     Prior elevated psa in the setting of UTI.     Plan:  - Continue sanctura. (reviewed side effects including dry mouth, constipation, urinary retention, confusion). He's going to stop the medication and contact us should he develop any of these symptoms. Understands to go to the ED if he cannot urinate over a 6 hour period.   - continue flomax medication  - psa today    - plan follow up in one year     Referring Provider:  Dr. Katrinka Blazing    PCP:   Dr. Charm Barges    Reason for Visit:  Incontinence    HPI:   Mr. Kershaw Sphar  is a pleasant 72 y.o. year-old male who is being seen today to follow up on incontinence.      Interval 08/21/22:   He returns today doing well on sanctura flomax. He notes he's sleeping well. Better than he's ever slept. Great stream. No hematuria/ dysuria. Only mild urgency.     He did have open heart surgery last November. 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 No recurrence of UTI.     IPSS: 1/ QOL 0    PVR 0 mls       Interval 08/16/21:   He was previously prescribed sanctura and flomax, but he's not sure if he's still taking these medications. Thinks he is currently.  He can sleep all night, without nocturia/ leakage, and is very pleased with this. Wears one pad throughout the day, but does have some urgency; this is unchanged.  Typically doesn't have accidents.  He does drink coffee/ pepsi, so likely these contribute to some urgency. He's also on spironolactone.     History of prior elevated psa in the setting of uti.     PVR 0 mls    Interval 04/23/20:  He is taking sanctura and flomax medication. He can sleep all night, without nocturia/ leakage, and is very pleased with this. Wears one pad throughout the day, but does have some urgency. Typically doesn't have accidents unless he waits too long to urinate. He does drink coffee/ pepsi, so likely these contribute to some urgency.     History of prior elevated psa in the setting of uti.     PVR 0 mls  UA trace protein/ ketones, 1+ bili     Regular bms.     Interval 02/2019:  He has started sanctura in addition to his flomax medication. He tells me everything is wonderful. Only requiring a security pad throughout the day he rarely has to change. Notes mild, but manageable dry mouth that is worse in the morning. He's now sleeping through the night without nocturia or leakage. Very pleased with medication. No complaints.       Initial visit with me:   Essentially, his problems are unchanged. Notes that he has some urge incontinence which is worst at night. He is wearing a pad, and plastic sheet underneath him. No leakage during the day, but does wear a security pad in case. Feels like sometimes he doesn't empty his bladder.     Plan was in 2018 to switch to myrbetriq 25mg , given severe dry mouth  with vesicare, however he believes that myrbetriq wasn't covered by his insurance.     Caffeine: 1 cup coffee/ day     BM:  Daily     PVR 12   UA trace blood    IPSS    Incomplete Emptying: 4  Frequency: 4  Intermittency: 4  Urgency: 5  Weak stream: 3  Straining: 1  Nocturia: 2    Total: 24  (0-7 mild, 8-19 moderate, 20-35 severe)    QOL: 5          PSA 0.9 (2018). Does have history of one elevated psa level in the setting of UTI.     Cysto 2018:    Cystoscopy (male)  Time out was performed immediately prior to the procedure.     The patient was prepped and draped in the usual sterile fashion.  Flexible cystosopy was performed.  The urethra was normal.  Prostatic urethra showed normal prostate with very mild right lateral and anterior lobe enlargement, no median lobe, no significant obstruction noted.  The bladder urothelium was without abnormality.  The ureteral orifices were visualized bilaterally, and efflux was clear.  Saline barbotage was not performed for cytology.  The patient tolerated the procedure well and was given one dose of antibiotic prophylaxis afterwards.    Today, he reports that he is doing well. Today, he denies fever, chills, chest pain, shortness of breath, cough, wheezing, nausea, vomiting, abdominal pain, flank pain, diarrhea, constipation, dysuria, hematuria, rashes, open wounds, edema, and weakness or numbness in the upper or lower extremities.     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    Corneal abrasion 1975    steel in right eye with abrasion and removal    Cubital tunnel syndrome on left 01/16/2019    Depression 2016    Diabetes mellitus (CMS-HCC)     GERD (gastroesophageal reflux disease) 1982    Hypertension     Meningitis 12/25/2015    Mixed hyperlipidemia     Myocardial infarction (CMS-HCC) 1986    Obesity 1986    first back surgery    Peptic ulceration        Past surgical history:  Past Surgical History:   Procedure Laterality Date    BACK SURGERY      CARDIAC CATHETERIZATION      STENTS PLACEMENT     CARDIAC SURGERY  2005    stents    CORONARY STENT PLACEMENT      FOREIGN BODY REMOVAL Right about 20 years ago    HERNIA REPAIR      RIGHT INGUNIAL    JOINT REPLACEMENT Left     TOTAL SHOULDER    PR ARM/ELBOW TENDON LENGTHEN,SINGLE,EA Left 02/17/2019    Procedure: TENDON LENGTHENING UPPER ARM/ELBOW SNGL EA;  Surgeon: Daisy Lazar, MD;  Location: ASC OR [facility];  Service: Orthopedics    PR CABG, ARTERIAL, SINGLE Midline 03/20/2022    Procedure: CORONARY ARTERY BYPASS, USING ARTERIAL GRAFT(S); SINGLE ARTERIAL GRAFT;  Surgeon: Arlester Marker, MD;  Location: MAIN OR [facility];  Service: Cardiac Surgery    PR CABG, ARTERY-VEIN, SINGLE Midline 03/20/2022  Procedure: CORONARY ARTERY BYPASS, USING VENOUS GRAFT(S) AND ARTERIAL GRAFT(S); SINGLE VEIN GRAFT;  Surgeon: Arlester Marker, MD;  Location: MAIN OR [facility];  Service: Cardiac Surgery    PR CATH PLACE/CORON ANGIO, IMG SUPER/INTERP,W LEFT HEART VENTRICULOGRAPHY N/A 02/01/2022    Procedure: Left Heart Catheterization;  Surgeon: Jacquelyne Balint, MD;  Location: [facility] CATH;  Service: Cardiology    PR ENDOSCOPY W/VIDEO-ASST VEIN HARVEST,CABG Right 03/20/2022    Procedure: ENDOSCOPY, SURGICAL, INCLUDING VIDEO-ASSISTED HARVEST OF VEIN(S) FOR CORONARY ARTERY BYPASS PROCEDURE;  Surgeon: Arlester Marker, MD;  [facility];  Service: Cardiac Surgery    PR OSTEOTOMY 1ST METATARSAL,BASE/SHAFT Left 07/20/2017    Procedure: OSTEOTOMY, W/WO LENGTHENING, SHORTENING OR ANGULAR CORRECTION, METATARSAL; FIRST METATARSAL;  Surgeon: Valda Favia, MD;  Location: ASC OR [facility];  Service: Ortho Foot & Ankle    PR OSTEOTOMY HEEL BONE Left 07/20/2017    Procedure: OSTEOTOMYLoistine Chance W/WO INT FIXA;  Surgeon: Valda Favia, MD;  Location: ASC OR [facility];  Service: Ortho Foot & Ankle    PR OSTEOTOMY METATARSAL (NOT 1ST) Left 06/06/2018    Procedure: OSTEOTOMY, W/WO LENGTHENING, SHORTENING OR ANGULAR CORRECTION, METATARSAL; OTHER THAN FIRST METATARSAL, EA;  Surgeon: Valda Favia, MD;  Location: [facility] OR [facility];  Service: Orthopedics    PR PART REMV PHALANX OF TOE Left 06/06/2018    Procedure: PART EXC BONE PHALANX TOE;  Surgeon: Valda Favia, MD;  Location: [facility] OR [facility];  Service: Orthopedics    PR RECONSTR TOTAL SHOULDER IMPLANT Left 11/12/2018    Procedure: R28   ARTHROPLASTY, GLENOHUMERAL JOINT; TOTAL SHOULDER(GLENOID & PROXIMAL HUMERAL REPLACEMENT(EG, TOTAL SHOULDER);  Surgeon: Nickola Major, MD;  Location: [facility] OR [facility];  Service: Orthopedics    PR REMOVAL IMPLANT DEEP Left 11/12/2018 Procedure: REMOVE IMPLANT; DEEP SHOULDER;  Surgeon: Nickola Major, MD;  [facility] OR [facility];  Service: Orthopedics    PR REPAIR BICEPS LONG TENDON Left 11/12/2018    Procedure: TENODESIS LONG TENDON BICEPS;  Surgeon: Nickola Major, MD;  Location: [facility] OR [facility];  Service: Orthopedics    PR REPAIR PERONEAL TENDONS,FIB OSTEOTMY Left 07/20/2017    Procedure: REPR DISLOC PERONEAL TENDONS; W/FIB OSTEOTOMY;  Surgeon: Valda Favia, MD;  Location: ASC OR [facility];  Service: Ortho Foot & Ankle    PR REVISE MEDIAN N/CARPAL TUNNEL SURG Left 02/17/2019    Procedure: R22 NEUROPLASTY AND/OR TRANSPOSITION; MEDIAN NERVE AT CARPAL TUNNEL;  Surgeon: Daisy Lazar, MD;  Location: ASC OR [facility];  Service: Orthopedics    PR REVISE ULNAR NERVE AT ELBOW Left 02/17/2019    Procedure: NEUROPLASTY AND/OR TRANSPOSITION; ULNAR NERVE AT ELBOW;  Surgeon: Daisy Lazar, MD;  Location: [facility] OR [facility];  Service: Orthopedics    SHOULDER SURGERY Bilateral     L TSA, R ROTATOR CUFF      SKIN BIOPSY      SPINE SURGERY  1986/1995/2000/2002    LUMBAR FUSION    stent         Social history:  Social History     Social History Narrative    Not on file       Family history:  family history includes Alcohol abuse in his father; COPD in his brother, brother, and sister; Cancer in his father and maternal grandmother; Cataracts in his father, mother, and paternal grandfather; Diabetes in his brother, father, mother, sister, and sister; Early death in his brother; Hypertension in his father, maternal grandfather, maternal grandmother, mother, paternal grandfather,  and paternal grandmother; Kidney disease in his maternal aunt; Melanoma in his father; No Known Problems in his maternal uncle, paternal aunt, paternal uncle, and another family member; Stroke in his mother.    MEDICATIONS:   Current Outpatient Medications   Medication Sig Dispense Refill    alirocumab 75 mg/mL PnIj Inject the contents of 1 pen (75 mg) under the skin every fourteen (14) days. 2 mL 11    amlodipine (NORVASC) 10 MG tablet Take 1 tablet (10 mg total) by mouth daily. For high blood pressure 90 tablet 3    aspirin (ECOTRIN) 81 MG tablet Take 1 tablet (81 mg total) by mouth daily. 30 tablet 11    blood glucose control, low (TRUE METRIX LEVEL 1) Soln For glucometer machine 1 each 2    blood sugar diagnostic (GLUCOSE BLOOD) Strp Check blood sugar as directed once a day and for symptoms of high or low blood sugar. 100 strip 2    blood-glucose meter kit Use as instructed. 1 each 0    clopidogreL (PLAVIX) 75 mg tablet TAKE 1 TABLET BY MOUTH DAILY 100 tablet 2    fluticasone propionate (FLONASE) 50 mcg/actuation nasal spray 2 sprays into each nostril daily. 48 mL 3    gabapentin (NEURONTIN) 300 MG capsule Take 2 capsules (600 mg total) by mouth two (2) times a day. 180 capsule 3    lancets Misc Disp. lancets #100 or amount allowed, Testing Qday. Dx: E11.9 (Diabetes- controlled) 100 each 3    metFORMIN (GLUCOPHAGE) 1000 MG tablet Take 1 tablet by mouth in the morning and 1 tablet in the evening with meals. 180 tablet 1    metoPROLOL tartrate (LOPRESSOR) 25 MG tablet Take 1 tablet (25 mg total) by mouth two (2) times a day. 180 tablet 3    omeprazole (PRILOSEC) 20 MG capsule Take 1 capsule (20 mg total) by mouth daily. 90 capsule 1    pravastatin (PRAVACHOL) 10 MG tablet Take 1 tablet (10 mg total) by mouth daily. 90 tablet 3    predniSONE (DELTASONE) 1 MG tablet TAKE 1 TABLET EVERY DAY 30 tablet 6    predniSONE (DELTASONE) 2.5 MG tablet Start Prednisone 7.5 mg once daily 3/13; Start Prednisone 5 mg once daily 4/10; Start Prednisone 2.5 mg once daily 5/8 90 tablet 0    sertraline (ZOLOFT) 100 MG tablet Take 1 tablet (100 mg total) by mouth daily. 90 tablet 1    tamsulosin (FLOMAX) 0.4 mg capsule Take 1 capsule (0.4 mg total) by mouth daily. 90 capsule 3    traZODone (DESYREL) 150 MG tablet TAKE 1 TABLET BY MOUTH AT NIGHT 90 tablet 2    trospium (SANCTURA) 20 mg tablet TAKE 1 TABLET BY MOUTH AT NIGHT 100 tablet 2    TRUEPLUS LANCETS 33 gauge Misc       docusate sodium (COLACE) 100 MG capsule Take 1 capsule (100 mg total) by mouth two (2) times a day. (Patient not taking: Reported on 08/21/2022) 60 capsule 0    magnesium oxide (MAG-OX) 400 mg (241.3 mg elemental magnesium) tablet Take 1 tablet (400 mg total) by mouth daily. (Patient not taking: Reported on 08/21/2022) 30 tablet 0     No current facility-administered medications for this visit.       Allergies:  Allergies   Allergen Reactions    Morphine Hives    Atorvastatin      neuropathy    Atorvastatin Calcium      neuropathy    Baclofen  headache  headache    Ibuprofen      GI bleed    Pneumococcal 23-Valent Polysaccharide Vaccine Other (See Comments)     Redness and pain at injection site. Fatigue  Pt stated his arm swelled within 24hrs after injection    Zetia [Ezetimibe] Other (See Comments)     Pt states it effected his brain  Pt stated it effected his sight, I couldn't drive a car at all       Review of Systems:  Positive for arthritis.  Otherwise, a 10 system ROS questionnaire completed by the patient and reviewed by me was normal.    Results for [place] (MRN [id number]) as of 04/05/2020 14:02   Ref. Range 01/09/2019 11:23   Prostate Specific Antigen Latest Ref Range: 0.00 - 4.00 ng/mL 0.47      Latest Reference Range & Units 05/18/20 09:42   Prostate Specific Antigen 0.00 - 4.00 ng/mL 0.51       Physical Exam  Vitals:    08/21/22 0901   BP: 154/86   Pulse: 64   Temp: 36.5 ??C (97.7 ??F)   TempSrc: Temporal   Weight: 87 kg (191 lb 14.4 oz)     GENERAL: The patient is a pleasant, male in no acute distress.   HEENT: Normocephalic and atraumatic.   NECK: Supple with trachea midline.   LYMPHATICS: No cervical or supraclavicular lymphadenopathy.   PULMONARY: Relaxed respiratory effort on room air.   CARDIOVASCULAR: Regular rate   GASTROINTESTINAL: Soft, nontender and nondistended     SKIN: No signs of cyanosis or clubbing.   NEUROLOGICAL: Grossly intact.   PSYCH: Alert and oriented x 3.     GENITOURINARY Circumcised penis with bilaterally descended testicles. No testicular masses or tenderness.  Digital rectal exam reveals a 30 g enlarged prostate that is firm, smooth and symmetric without nodules or tenderness.

## 2022-08-22 ENCOUNTER — Ambulatory Visit: Admit: 2022-08-22 | Discharge: 2022-08-23 | Payer: MEDICARE | Attending: Internal Medicine | Primary: Internal Medicine

## 2022-08-22 DIAGNOSIS — E1159 Type 2 diabetes mellitus with other circulatory complications: Principal | ICD-10-CM

## 2022-08-22 DIAGNOSIS — Z789 Other specified health status: Principal | ICD-10-CM

## 2022-08-22 DIAGNOSIS — E1169 Type 2 diabetes mellitus with other specified complication: Principal | ICD-10-CM

## 2022-08-22 DIAGNOSIS — I240 Acute coronary thrombosis not resulting in myocardial infarction: Principal | ICD-10-CM

## 2022-08-22 DIAGNOSIS — N182 Chronic kidney disease, stage 2 (mild): Principal | ICD-10-CM

## 2022-08-22 DIAGNOSIS — I259 Chronic ischemic heart disease, unspecified: Principal | ICD-10-CM

## 2022-08-22 DIAGNOSIS — I152 Hypertension secondary to endocrine disorders: Principal | ICD-10-CM

## 2022-08-22 DIAGNOSIS — Z951 Presence of aortocoronary bypass graft: Principal | ICD-10-CM

## 2022-08-22 DIAGNOSIS — E1122 Type 2 diabetes mellitus with diabetic chronic kidney disease: Principal | ICD-10-CM

## 2022-08-22 DIAGNOSIS — E785 Hyperlipidemia, unspecified: Principal | ICD-10-CM

## 2022-08-22 NOTE — Unmapped (Addendum)
If you find out that your insurer will not cover Praluent (injection for cholesterol) we can try Repatha. Very similar drugs.    Keep up the great work with the exercise/staying active.     On days you aren't outside working, go for a nice brisk walk.    Lets catch up in 6 months    Lorretta Harp, MD, PhD  [facility]  Cardiology

## 2022-08-22 NOTE — Unmapped (Signed)
[facility] CARDIOLOGY AT [facility]  [address] - Phone: [phone]  [place], [place] [address] - Fax: [phone]     Date of Service: 08/22/2022    PCP: Amada Kingfisher, MD  Referring Provider: Amada Kingfisher, MD    ASSESSMENT and PLAN:     Christopher [name] is a 72 y.o. male presenting at the request of Amada Kingfisher, MD for evaluation and management of:  1. Ischemic heart disease due to coronary artery obstruction (CMS-HCC)    2. S/P CABG (coronary artery bypass graft)    3. Hypertension associated with type 2 diabetes mellitus (CMS-HCC)    4. Hyperlipidemia associated with type 2 diabetes mellitus (CMS-HCC)    5. Statin intolerance    6. Type 2 diabetes mellitus with stage 2 chronic kidney disease, without long-term current use of insulin (CMS-HCC)      Christopher [name] presents in follow up. I last saw him 05/2022. He had CABG in Nov 2023. This was completed without complication. EF post CABG normal. He is doing very well. Staying quite active. No angina or SOB. Medications reviewed. His insurer may require him to switch to [place] at the end of the year. For now no medication changes. He would benefit from SGLT2 inhibitor but we can disucsus that and I will send his PCP a message. BP normal. Exam unremarkable.     1. Ischemic heart disease s/p PCI to LAD/D1 2004 with new angina 12/2021 => LHC showed 90% ostial LAD. Referred for CABG evaluation. S/p CABG 2V 02/2022 LIMA to LAD, RSVG to OM.  - aspirin 81mg  daily monotherapy  - on amlodipine, metoprolol  - last echo: 02/2022 normal EF  - stress/angio: see below  - low dose statin, +praluent    2. HTN,   - continue amlodipine, metoprolol    3. TIA, presented 05/2020 to [facility] ER with dysarthria and right sided weakness. Symptoms now resolved  - aggressive RF modification (BP, HLD, DM)  - anti-platelets as above  - ziopatch no afib, 4% PVCs    4. Diabetes, not on insulin. Good control previously.   - he would benefit from SGLT2 inhibitor  Lab Results   Component Value Date    A1C 7.4 (H) 06/06/2022     5. ED  - prescribed viagra with instructions for use re: nitroglycerin sl (hasn't needed in 2+ years)    6. PMR  - managed by his PCP. On prednisone    Follow-up: Return in about 6 months (around 02/21/2023).    Lorretta Harp, MD, PhD  [facility] Cardiology      ORDERS THIS VISIT:     No orders of the defined types were placed in this encounter.     New Prescriptions    No medications on file      Modified Medications    No medications on file      Discontinued Medications    CLOPIDOGREL (PLAVIX) 75 MG TABLET    TAKE 1 TABLET BY MOUTH DAILY        SUBJECTIVE:     ID: Christopher [name] is a 72 y.o. male with a history of HTN, DM, CAD s/p PCI    Reason for Visit: Coronary Artery Disease (2 month checkup)     Cardiovascular History:    History of PCI in 2004. Presented to [facility] ER 06/10/20 with dysarthria and right sided weakness. Diagnosed with TIA. Carotid U/S showing mild carotid disease.  04/2021 referred to [facility] ER for severe episode of chest pain with subsequent new fatigue/SOB. Troponin negative x2.    9-01/2022 accelerating angina. LHC showed 90% ostial LAD with patent proximal LAD stent. Referred for CABG evaluation.     02/2022 [place]. CABG 2V    04/2022 diagnosed with PMR    Interval History:    Doing well. 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 Walks 3 miles a day.    No chest pain at all. Feels good.      Review of Systems  10 systems were reviewed and negative except as noted in HPI.    Cardiovascular History and Pertinent Past Medical History:  CAD  NSTEMI in 2004 s/p PCI to LAD/D1  TIA 05/2020  HTN  DM  Statin intolerance  PMR 04/2022     has a past medical history of Acute kidney injury (CMS-HCC) (01/12/2018), Anemia, Anxiety (dont know), Arthritis, Basal cell carcinoma, Cataract, CHF (congestive heart failure) (CMS-HCC), Chronic gout of right knee (12/22/2021), Corneal abrasion (1975), Cubital tunnel syndrome on left (01/16/2019), Depression (2016), Diabetes mellitus (CMS-HCC), GERD (gastroesophageal reflux disease) (1982), Hypertension, Meningitis (12/25/2015), Mixed hyperlipidemia, Myocardial infarction (CMS-HCC) (1986), Obesity (1986), and Peptic ulceration.    Prior Cardiovascular Interventions / Surgery:  NSTEMI in 2004 s/p PCI to LAD/D1    CABG  LIMA to LAD, RSVG to OM.  03/20/22     Prior Cardiovascular Diagnostics:    ECG   05/11/21 sinus rhythm, PRWP ?anterior infarct new from last tracing 05/2020  01/31/22 sinus rhythm, PRWP ?lead placement, NS TWA new from prior  02/21/22 sinus rhythm, NS TWA, PRWP    Ziopatch 06/10/20 - 4% PVC burden. Otherwise, unremarkable 14 day ambulatory monitor.     Echo 05/2021, normal EF  Echo 01/2022 EF 50-55%, normal RV  Echo 02/2022 normal EF, post CABG    SPECT [facility] 2017  - Low risk study  - There is a small in size, mild in severity, partially reversible defect involving the mid inferior and mid inferolateral segments. This is consistent with probable artifact (diaphragmatic attenuation) and cannot rule out mild ischemia.  - Post stress: Global systolic function is low normal. The ejection fraction calculated at 51%.     Carotid U/S 05/2020  1. Small amount of atherosclerotic plaque at the carotid bulbs  bilaterally.  2. No hemodynamically significant stenosis of the extracranial  portions of the internal carotid arteries.  3. Antegrade flow within the vertebral arteries.    LHC 01/2022  - 90% ostial LAD stenosis  - Moderate diffuse CAD  - Patent stent in proximal LAD     Other Surgical History:   has a past surgical history that includes Hernia repair; stent; Foreign Body Removal (Right, about 20 years ago); Cardiac catheterization; Coronary stent placement; Back surgery; Spine surgery (1986/1995/2000/2002); Cardiac surgery (2005); pr repair peroneal tendons,fib osteotmy (Left, 07/20/2017); pr osteotomy heel bone (Left, 07/20/2017); pr osteotomy 1st metatarsal,base/shaft (Left, 07/20/2017); Skin biopsy; pr osteotomy metatarsal (not 1st) (Left, 06/06/2018); pr part remv phalanx of toe (Left, 06/06/2018); pr reconstr total shoulder implant (Left, 11/12/2018); pr removal implant deep (Left, 11/12/2018); pr repair biceps long tendon (Left, 11/12/2018); pr revise median n/carpal tunnel surg (Left, 02/17/2019); pr arm/elbow tendon lengthen,single,ea (Left, 02/17/2019); pr revise ulnar nerve at elbow (Left, 02/17/2019); Joint replacement (Left); Shoulder surgery (Bilateral); pr cath place/coron angio, img super/interp,w left heart ventriculography (N/A, 02/01/2022); pr cabg, arterial, single (Midline, 03/20/2022); pr endoscopy w/video-asst vein harvest,cabg (Right, 03/20/2022); and pr cabg, artery-vein, single (Midline, 03/20/2022).  Current Outpatient Medications   Medication Instructions    alirocumab 75 mg/mL PnIj Inject the contents of 1 pen (75 mg) under the skin every fourteen (14) days.    amlodipine (NORVASC) 10 mg, Oral, Daily (standard), For high blood pressure    aspirin (ECOTRIN) 81 mg, Oral, Daily (standard)    blood glucose control, low (TRUE METRIX LEVEL 1) Soln For glucometer machine    blood sugar diagnostic (GLUCOSE BLOOD) Strp Check blood sugar as directed once a day and for symptoms of high or low blood sugar.    blood-glucose meter kit Use as instructed.    docusate sodium (COLACE) 100 mg, Oral, 2 times a day (standard)    fluticasone propionate (FLONASE) 50 mcg/actuation nasal spray 2 sprays, Each Nare, Daily (standard)    gabapentin (NEURONTIN) 600 mg, Oral, 2 times a day (standard)    lancets Misc Disp. lancets #100 or amount allowed, Testing Qday. Dx: E11.9 (Diabetes- controlled)    magnesium oxide (MAGOX) 400 mg, Oral, Daily (standard)    metFORMIN (GLUCOPHAGE) 1000 MG tablet Take 1 tablet by mouth in the morning and 1 tablet in the evening with meals.    metoPROLOL tartrate (LOPRESSOR) 25 mg, Oral, 2 times a day (standard)    omeprazole (PRILOSEC) 20 mg, Oral, Daily (standard)    pravastatin (PRAVACHOL) 10 mg, Oral, Daily (standard)    predniSONE (DELTASONE) 2.5 MG tablet Start Prednisone 7.5 mg once daily 3/13; Start Prednisone 5 mg once daily 4/10; Start Prednisone 2.5 mg once daily 5/8    predniSONE (DELTASONE) 1 mg, Oral, Daily (standard)    sertraline (ZOLOFT) 100 mg, Oral, Daily (standard)    tamsulosin (FLOMAX) 0.4 mg, Oral, Daily (standard)    traZODone (DESYREL) 150 mg, Oral, Nightly    trospium (SANCTURA) 20 mg, Oral, Nightly    TRUEPLUS LANCETS 33 gauge Misc         Allergies:  is allergic to morphine, atorvastatin, atorvastatin calcium, baclofen, ibuprofen, pneumococcal 23-valent polysaccharide vaccine, and zetia [ezetimibe].    Social History:  He  reports that he has never smoked. He has never used smokeless tobacco. He reports that he does not currently use drugs. He reports that he does not drink alcohol.    Family History:  His family history includes Alcohol abuse in his father; COPD in his brother, brother, and sister; Cancer in his father and maternal grandmother; Cataracts in his father, mother, and paternal grandfather; Diabetes in his brother, father, mother, sister, and sister; Early death in his brother; Hypertension in his father, maternal grandfather, maternal grandmother, mother, paternal grandfather, and paternal grandmother; Kidney disease in his maternal aunt; Melanoma in his father; No Known Problems in his maternal uncle, paternal aunt, paternal uncle, and another family member; Stroke in his mother.       OBJECTIVE:      BP 130/78 (BP Site: L Arm, BP Position: Sitting, BP Cuff Size: Large)  - Pulse 94  - Temp 36.8 ??C (98.2 ??F) (Temporal)  - Resp 18  - Ht 165.1 cm (5' 5)  - Wt 87.7 kg (193 lb 6.4 oz)  - SpO2 97%  - BMI 32.18 kg/m??    Wt Readings from Last 3 Encounters:   08/22/22 87.7 kg (193 lb 6.4 oz)   08/21/22 87 kg (191 lb 14.4 oz)   06/06/22 82.1 kg (181 lb)     BP Readings from Last 5 Encounters:   08/22/22 130/78   08/21/22 154/86   06/06/22 142/91   05/25/22 140/84  05/04/22 (!) 171/111     Pulse Readings from Last 3 Encounters:   08/22/22 94   08/21/22 64   06/06/22 82       General:  Alert, no distress.   HEENT:  EOMI, sclerae anicteric   Neck: No carotid bruit. JVP is not visible sitting upright   Lungs:   CTAB bilaterally with normal WOB.   CV:  RRR, normal s1 and s2, no murmurs, normal radial pulses b/l   Abdomen:   Soft, non-tender   Extremities: No LE edema   Skin: No lesions/rashes.   Neurologic: No focal deficits.     Lab Results   Component Value Date    HGB 12.3 (L) 04/28/2022    HGB 9.4 (L) 03/27/2022    HGB 9.7 (L) 03/26/2022    PLT 225 04/28/2022    PLT 212 03/27/2022    PLT 193 03/26/2022     Lab Results   Component Value Date    CREATININE 0.98 04/28/2022    CREATININE 1.00 03/27/2022    CREATININE 0.89 03/26/2022    K 4.4 04/28/2022    K 4.2 03/27/2022    K 4.1 03/26/2022      No results found for: BNP  Lab Results   Component Value Date    PROBNP 155.0 01/11/2018     Lab Results   Component Value Date    CHOL 247 (H) 11/18/2021    CHOL 214 (H) 08/29/2017    LDL 138 (H) 11/18/2021    LDL 91 08/29/2017    HDL 48 11/18/2021    HDL 53 08/29/2017    TRIG 098 (H) 11/18/2021    TRIG 349 (H) 08/29/2017     Lab Results   Component Value Date    A1C 7.4 (H) 06/06/2022

## 2022-08-28 NOTE — Unmapped (Signed)
Hi,     Patient contacted the clinic regarding the following:    - Please call [facility] patient about auth of medication. Thanks    Please contact patient at [phone].    Thank you,    Hulan Amato  Urology Services  [phone]

## 2022-09-06 NOTE — Unmapped (Signed)
Called patient he said he wants trospium to be transferred to [facility], contacted [facility] and they sent the prior authorization letter via fax, for which I will forward to [facility]. Thanks

## 2022-09-06 NOTE — Unmapped (Signed)
Hi,     Patient contacted the clinic regarding the following:    - Please call [facility] patient he has a questions about medication. Thanks     Please contact patient at [phone].    Thank you,    Hulan Amato  Urology Services  [phone]

## 2022-09-22 NOTE — Unmapped (Signed)
[facility] Refill Coordination Note    Specialty Medication(s) to be Shipped:   General Specialty: Praluent    Other medication(s) to be shipped: No additional medications requested for fill at this time     Korin Saladino, DOB: 1951-03-23  Phone: [phone] (home)       All above HIPAA information was verified with patient.     Was a Nurse, learning disability used for this call? No    Completed refill call assessment today to schedule patient's medication shipment from the [facility] [phone]).  All relevant notes have been reviewed.     Specialty medication(s) and dose(s) confirmed: Regimen is correct and unchanged.   Changes to medications: Taisei reports no changes at this time.  Changes to insurance: No  New side effects reported not previously addressed with a pharmacist or physician: None reported  Questions for the pharmacist: No    Confirmed patient received a Conservation officer, historic buildings and a Surveyor, mining with first shipment. The patient will receive a drug information handout for each medication shipped and additional FDA Medication Guides as required.       DISEASE/MEDICATION-SPECIFIC INFORMATION        For patients on injectable medications: Patient currently has 1 doses left.  Next injection is scheduled for 6/5.    SPECIALTY MEDICATION ADHERENCE     Medication Adherence    Patient reported X missed doses in the last month: 0  Specialty Medication: praluent pen 75mg /ml              Were doses missed due to medication being on hold? No    praluent pen 75mg /ml   : 5 days of medicine on hand       REFERRAL TO PHARMACIST     Referral to the pharmacist: Not needed      [facility]     Shipping address confirmed in Epic.       Delivery Scheduled: Yes, Expected medication delivery date: 6/5.     Medication will be delivered via UPS to the prescription address in Epic WAM.    Westley Gambles   [facility] Specialty Technician

## 2022-09-26 MED FILL — PRALUENT PEN 75 MG/ML SUBCUTANEOUS PEN INJECTOR: SUBCUTANEOUS | 84 days supply | Qty: 6 | Fill #5

## 2022-09-30 DIAGNOSIS — F32A Depression, unspecified depression type: Principal | ICD-10-CM

## 2022-09-30 DIAGNOSIS — N182 Chronic kidney disease, stage 2 (mild): Principal | ICD-10-CM

## 2022-09-30 DIAGNOSIS — E1122 Type 2 diabetes mellitus with diabetic chronic kidney disease: Principal | ICD-10-CM

## 2022-09-30 MED ORDER — METFORMIN 1,000 MG TABLET
ORAL_TABLET | 3 refills | 0 days
Start: 2022-09-30 — End: ?

## 2022-09-30 MED ORDER — SERTRALINE 100 MG TABLET
ORAL_TABLET | Freq: Every day | ORAL | 3 refills | 0 days
Start: 2022-09-30 — End: ?

## 2022-09-30 MED ORDER — OMEPRAZOLE 20 MG CAPSULE,DELAYED RELEASE
ORAL_CAPSULE | Freq: Every day | ORAL | 3 refills | 0 days
Start: 2022-09-30 — End: ?

## 2022-10-02 MED ORDER — METFORMIN 1,000 MG TABLET
ORAL_TABLET | 1 refills | 0 days | Status: CP
Start: 2022-10-02 — End: ?

## 2022-10-02 MED ORDER — SERTRALINE 100 MG TABLET
ORAL_TABLET | Freq: Every day | ORAL | 1 refills | 90 days | Status: CP
Start: 2022-10-02 — End: ?

## 2022-10-02 MED ORDER — OMEPRAZOLE 20 MG CAPSULE,DELAYED RELEASE
ORAL_CAPSULE | Freq: Every day | ORAL | 1 refills | 90 days | Status: CP
Start: 2022-10-02 — End: ?

## 2022-10-03 ENCOUNTER — Ambulatory Visit: Admit: 2022-10-03 | Discharge: 2022-10-04 | Payer: MEDICARE

## 2022-10-03 MED ORDER — EMPAGLIFLOZIN 10 MG TABLET
ORAL_TABLET | Freq: Every day | ORAL | 3 refills | 90 days | Status: CP
Start: 2022-10-03 — End: ?

## 2022-10-03 NOTE — Unmapped (Addendum)
Diagnosed January 2024 with profound fatige and weakness. Feels almost back to normal now. Plan for long taper. Now taking Prednisone 1 mg once daily. Counseled to take first thing in the morning to help with sleep.     Taper as below:  - 15mg  daily for 4 weeks   - 12.5mg  daily for 4 weeks   - 10 mg daily for 4 weeks   - 7.5mg  for 4 weeks   - 5 mg for 4 weeks   - 2.5mg  for 4 weeks   - 1mg  for 4 weeks

## 2022-10-03 NOTE — Unmapped (Addendum)
DIABETES PLAN     Dm goal is <8.0%  Lab Results   Component Value Date    A1C 8.7 (H) 10/03/2022    A1C 7.4 (H) 06/06/2022    A1C 6.7 04/06/2022       BP goal is <130/90  BP Readings from Last 3 Encounters:   10/03/22 133/80   08/22/22 130/78   08/21/22 154/86       The 10-yr ASCVD Risk score Denman George DC Jr., et al., 2013) failed to calculate due to the following reason:  The 2013 10-yr ASCVD risk score is only valid if the patient does not have prior/existing clinical ASCVD (myocardial infarction, stroke, CABG, coronary angioplasty, angina or peripheral arterial disease, coronary atherosclerosis, ischemic heart disease, or cerebrovascular disease). The patient has prior/existing ASCVD.  Had Coronary Angioplasty  Has Angina  Has Coronary Atherosclerosis      Current Diabetes Medications: Metformin 1000 mg PO BID     Today's Recommendations:  Continue Metformin 1000 mg BID. Start Jardiance 10 mg once daily. Discussed history of UTI. No UTI since 2020. Counseled on increased risk in older adults, those with previous infection, and T2DM. Will alert me ASAP if he develops any potential symptoms. FU 4 weeks.    Continue baby aspirin and pravastatin.    Recommend continued regular exercise as tolerated.  Recommend avoiding carbohydrate rich foods and moderation.    Diabetes Quality Metrics    Metric Comments   Most Recent Foot Exam Date: 04/06/2022    Eye Exam date: 01/03/2022    Prescribed aspirin: Yes    Prescribed ACE inhibitor: No OR Prescribed ARBs: No    Prescribed statins: Yes    Pneumonia vaccination: 12/07/2015    Smokes Tobacco: No      Most Recent Diabetes Labs  Lab Results   Component Value Date    LDL 138 (H) 11/18/2021    ALBCRERATIO 3.7 11/28/2016    NA 138 04/28/2022    K 4.4 04/28/2022    CREATININE 0.98 04/28/2022

## 2022-10-03 NOTE — Unmapped (Signed)
S/P second CABG 03/20/2022, doing well without any concerns. Continue follow-up with cardiology.

## 2022-10-03 NOTE — Unmapped (Signed)
Family Medicine Office Visit    Assessment/Plan    Problem List Items Addressed This Visit          Endocrine    Hypertension associated with type 2 diabetes mellitus (CMS-HCC)     Well controlled.  - Continue Amlodipine 10 mg and Metop 25 mg BID.           Type 2 diabetes mellitus with stage 2 chronic kidney disease, without long-term current use of insulin (CMS-HCC) - Primary     DIABETES PLAN     Dm goal is <8.0%  Lab Results   Component Value Date    A1C 8.7 (H) 10/03/2022    A1C 7.4 (H) 06/06/2022    A1C 6.7 04/06/2022       BP goal is <130/90  BP Readings from Last 3 Encounters:   10/03/22 133/80   08/22/22 130/78   08/21/22 154/86       The 10-yr ASCVD Risk score Denman George DC Jr., et al., 2013) failed to calculate due to the following reason:  The 2013 10-yr ASCVD risk score is only valid if the patient does not have prior/existing clinical ASCVD (myocardial infarction, stroke, CABG, coronary angioplasty, angina or peripheral arterial disease, coronary atherosclerosis, ischemic heart disease, or cerebrovascular disease). The patient has prior/existing ASCVD.  Had Coronary Angioplasty  Has Angina  Has Coronary Atherosclerosis      Current Diabetes Medications: Metformin 1000 mg PO BID     Today's Recommendations:  Continue Metformin 1000 mg BID. Start Jardiance 10 mg once daily. Discussed history of UTI. No UTI since 2020. Counseled on increased risk in older adults, those with previous infection, and T2DM. Will alert me ASAP if he develops any potential symptoms. FU 4 weeks.    Continue baby aspirin and pravastatin.    Recommend continued regular exercise as tolerated.  Recommend avoiding carbohydrate rich foods and moderation.    Diabetes Quality Metrics    Metric Comments   Most Recent Foot Exam Date: 04/06/2022    Eye Exam date: 01/03/2022    Prescribed aspirin: Yes    Prescribed ACE inhibitor: No OR Prescribed ARBs: No    Prescribed statins: Yes    Pneumonia vaccination: 12/07/2015    Smokes Tobacco: No Most Recent Diabetes Labs  Lab Results   Component Value Date    LDL 138 (H) 11/18/2021    ALBCRERATIO 3.7 11/28/2016    NA 138 04/28/2022    K 4.4 04/28/2022    CREATININE 0.98 04/28/2022            Relevant Medications    empagliflozin (JARDIANCE) 10 mg tablet       Cardiovascular and Mediastinum    Coronary artery disease involving native coronary artery of native heart with angina pectoris (CMS-HCC)     S/P second CABG 03/20/2022, doing well without any concerns. Continue follow-up with cardiology.            Other    Polymyalgia rheumatica (CMS-HCC)     Diagnosed January 2024 with profound fatige and weakness. Feels almost back to normal now. Plan for long taper. Now taking Prednisone 1 mg once daily. Counseled to take first thing in the morning to help with sleep.     Taper as below:  - 15mg  daily for 4 weeks   - 12.5mg  daily for 4 weeks   - 10 mg daily for 4 weeks   - 7.5mg  for 4 weeks   - 5 mg for 4 weeks   -  2.5mg  for 4 weeks   - 1mg  for 4 weeks               # Screening and Prevention  - Colorectal Screening:  Brought records from [facility] (scanned) Colonoscopy performed 2015 - three 2-5 mm hyperplastic polyps in the sigmoid colon, internal hemorrhoids, repeat 10 years (2025).  - Hep C Ab:  Negative.  - HIV Ab:  Negative.    # Vaccines  - TDaP: 2017.  - pneumovax: 2016, 2017.  - seasonal influenza: Received.  - COVID:  Received.    I personally spent 45 minutes face-to-face and non-face-to-face in the care of this patient, which includes all pre, intra, and post visit time on the date of service.       Subjective:    Chief Complaint   Patient presents with    Follow-up       History of Present Illness  Christopher [name] is a 72 y.o. male who is here for follow-up.    Taking prednisone 1 mg once daily. Started 6/5.  Experiencing constipation and gas.    Physical Exam:  Vitals Reviewed:  BP 133/80 (BP Site: L Arm, BP Position: Sitting, BP Cuff Size: Medium)  - Pulse 69  - Temp 36.1 ??C (96.9 ??F) (Temporal)  - Ht 165.1 cm (5' 5) Comment: as reported by pt - Wt 86.5 kg (190 lb 12.8 oz)  - BMI 31.75 kg/m??    Weight:   Wt Readings from Last 3 Encounters:   10/03/22 86.5 kg (190 lb 12.8 oz)   08/22/22 87.7 kg (193 lb 6.4 oz)   08/21/22 87 kg (191 lb 14.4 oz)     General: well-nourished, no acute distress, pleasant.   Head: normocephalic, atraumatic  Heart: RRR, no M/R/G.   Lungs: CTAB, no wheezes.  Psychiatry: A&O to person, place, and time. Mood appropriately reactive.  Thought process linear and goal directed.

## 2022-10-03 NOTE — Unmapped (Signed)
Patient Advice Request     Patient Name: Christopher [name].  Caller: Spouse-Deborah Amaro   Contact Method: Telephone Call: Time- Any Time [phone]   Reason for Call: Patients wife is calling in and states that she will be by in a few days to drop a form that is needing pcp signature, patient is not able to get medication due to insurance not covering the medication at all. Patient will call pharmacy assistance first and see what they say regarding the pharmacy assistance program and then go from there.  Previously Discussed: yes  Appointment Offered: No

## 2022-10-03 NOTE — Unmapped (Addendum)
Well controlled.  - Continue Amlodipine 10 mg and Metop 25 mg BID.

## 2022-10-05 DIAGNOSIS — N182 Chronic kidney disease, stage 2 (mild): Principal | ICD-10-CM

## 2022-10-05 DIAGNOSIS — E1122 Type 2 diabetes mellitus with diabetic chronic kidney disease: Principal | ICD-10-CM

## 2022-10-05 MED ORDER — JARDIANCE 10 MG TABLET
ORAL_TABLET | Freq: Every day | ORAL | 3 refills | 90 days
Start: 2022-10-05 — End: ?

## 2022-10-05 NOTE — Unmapped (Signed)
Pt is requesting a different medication because   empagliflozin (JARDIANCE) 10 mg tablet [[id number]]  is to expensive. Please advise

## 2022-10-06 MED ORDER — EMPAGLIFLOZIN 10 MG TABLET
ORAL_TABLET | Freq: Every day | ORAL | 3 refills | 90 days
Start: 2022-10-06 — End: ?

## 2022-10-06 MED ORDER — TROSPIUM 20 MG TABLET
ORAL_TABLET | Freq: Every evening | ORAL | 3 refills | 90 days | Status: CP
Start: 2022-10-06 — End: 2023-10-01

## 2022-10-10 NOTE — Unmapped (Signed)
[facility] ([facility]  Medication Management - Initial      Patient seen for referral of items noted below    Name: Christopher [name]  Date of Birth: 1950-08-08  Today's Date: 10/11/2022  Age: 72 y.o.    To patients reading this note: Please be advised that the primary purpose of this note is for [place] to keep track of your care and communicate with other members of your medical team. These suggested changes are intended to enhance the overall care you receive.  Approved changes will be communicated to you by your care team.     Provider Recommendations     Assisted patient in reapplying for Extra Help through Social Security to see if he will qualify again. If he does, Jardiance copay should be $11.20 per month.   If patient does not qualify for Extra Help, recommend the patient apply for the manufacturer assistance program for Jardiance to see if they will qualify to get the medication for free from the manufacturer. Mailed application to patient.        Patient Summary      Medication Affordability   Patient reports the following medications are unaffordable: Jardiance  Prescription insurance status: [employer]     Assessment / Plan  Reviewed patient's insurance. [place] is covered as tier 3 medications and is estimated to have a $45 per month copay unless he goes into the coverage gap then copay is estimated to increase to ~$140-150 per month. Based on the reported copay cost from the patient, I suspect he is in the coverage gap currently. I think patient would benefit from Extra Help if he qualifies. Discussed with patient who reports he previously had Extra Help but it was taken away last year. He reports he received no explanation for why he no longer qualifies. Advised that sometimes Social Security makes you reapply and I wonder if that is why his was taken away. Goal to lower medication cost and improve adherence.   Assisted patient in reapplying for Extra Help through Social Security to see if he will qualify again. If he does, Jardiance copay should be $11.20 per month.   If patient does not qualify for Extra Help, recommend the patient apply for the manufacturer assistance program for Jardiance to see if they will qualify to get the medication for free from the manufacturer. Mailed application to patient.   _____________________________________________________    Medication Management   All medications including indication, dose, frequency, and affordability concerns (if applicable) and disease states were reviewed and assessed during visit. Please see specific findings and recommendations above. Patient education provided regarding [facility].     Patient has completed the [facility] program. Questions/concerns were addressed to the patient's satisfaction. Patient provided the [facility] phone number for future needs. We have appreciated the opportunity to be part of this patient's care.     Notes and recommendations from today's visit have been sent to the appropriate provider(s)      Chesley Mires, CPP, PharmD  Clinical Pharmacist- [facility] ([facility])  [facility]: [phone] - Fax: [phone]      The patient reports they are physically located in [place] and is currently: at home. I conducted a phone visit.  I spent 16 minutes on the phone call with the patient on the date of service .     Future Appointments   Date Time Provider Department Center   10/11/2022  8:30 AM Dub Amis, CPP [facility]   10/27/2022  8:00 AM Amada Kingfisher, MD [facility]   11/24/2022  8:20 AM Amada Kingfisher, MD [facility]   02/27/2023  8:40 AM Lorretta Harp, MD [facility]      Current Medications:     Current Outpatient Medications on File Prior to Visit   Medication Sig Dispense Refill    alirocumab 75 mg/mL PnIj Inject the contents of 1 pen (75 mg) under the skin every fourteen (14) days. 2 mL 11 amlodipine (NORVASC) 10 MG tablet Take 1 tablet (10 mg total) by mouth daily. For high blood pressure 90 tablet 3    aspirin (ECOTRIN) 81 MG tablet Take 1 tablet (81 mg total) by mouth daily. 30 tablet 11    blood glucose control, low (TRUE METRIX LEVEL 1) Soln For glucometer machine 1 each 2    blood sugar diagnostic (GLUCOSE BLOOD) Strp Check blood sugar as directed once a day and for symptoms of high or low blood sugar. 100 strip 2    blood-glucose meter kit Use as instructed. 1 each 0    docusate sodium (COLACE) 100 MG capsule Take 1 capsule (100 mg total) by mouth two (2) times a day. (Patient taking differently: Take 1 capsule (100 mg total) by mouth once as needed.) 60 capsule 0    empagliflozin (JARDIANCE) 10 mg tablet Take 1 tablet (10 mg total) by mouth daily. 90 tablet 3    fluticasone propionate (FLONASE) 50 mcg/actuation nasal spray 2 sprays into each nostril daily. 48 mL 3    gabapentin (NEURONTIN) 300 MG capsule Take 2 capsules (600 mg total) by mouth two (2) times a day. 180 capsule 3    lancets Misc Disp. lancets #100 or amount allowed, Testing Qday. Dx: E11.9 (Diabetes- controlled) 100 each 3    magnesium oxide (MAG-OX) 400 mg (241.3 mg elemental magnesium) tablet Take 1 tablet (400 mg total) by mouth daily. (Patient not taking: Reported on 08/21/2022) 30 tablet 0    metFORMIN (GLUCOPHAGE) 1000 MG tablet TAKE 1 TABLET BY MOUTH IN THE MORNING AND 1 TABLET IN THE EVENING WITH MEALS. 180 tablet 1    metoPROLOL tartrate (LOPRESSOR) 25 MG tablet Take 1 tablet (25 mg total) by mouth two (2) times a day. 180 tablet 3    omeprazole (PRILOSEC) 20 MG capsule TAKE 1 CAPSULE (20 MG TOTAL) BY MOUTH DAILY. 90 capsule 1    pravastatin (PRAVACHOL) 10 MG tablet Take 1 tablet (10 mg total) by mouth daily. 90 tablet 3    predniSONE (DELTASONE) 1 MG tablet TAKE 1 TABLET EVERY DAY 30 tablet 6    predniSONE (DELTASONE) 2.5 MG tablet Start Prednisone 7.5 mg once daily 3/13; Start Prednisone 5 mg once daily 4/10; Start Prednisone 2.5 mg once daily 5/8 (Patient not taking: Reported on 10/03/2022) 90 tablet 0    sertraline (ZOLOFT) 100 MG tablet TAKE 1 TABLET (100 MG TOTAL) BY MOUTH DAILY. 90 tablet 1    tamsulosin (FLOMAX) 0.4 mg capsule Take 1 capsule (0.4 mg total) by mouth daily. 90 capsule 3    traZODone (DESYREL) 150 MG tablet TAKE 1 TABLET BY MOUTH AT NIGHT 90 tablet 2    trospium (SANCTURA) 20 mg tablet Take 1 tablet (20 mg total) by mouth nightly. 90 tablet 3    TRUEPLUS LANCETS 33 gauge Misc        No current facility-administered medications on file prior to visit.

## 2022-10-11 ENCOUNTER — Ambulatory Visit: Admit: 2022-10-11 | Discharge: 2022-10-12

## 2022-10-11 DIAGNOSIS — Z79899 Other long term (current) drug therapy: Principal | ICD-10-CM

## 2022-10-20 NOTE — Unmapped (Signed)
I have put these forms in your box

## 2022-10-20 NOTE — Unmapped (Signed)
Form Processing     Patient Name: Christopher [name].   Form Left By: Self (Patient)  How was the Form Received: Dropped off  Type of Form:  Application for Drug   Completed Form Delivery: Faxed- NumberIncluded  Date needed: ASAP    Provider form should go to:Dr. Ok Anis

## 2022-10-26 NOTE — Unmapped (Signed)
Family Medicine Office Visit    Assessment/Plan    Problem List Items Addressed This Visit          Endocrine    Hypertension associated with type 2 diabetes mellitus (CMS-HCC)     Not well controlled; however, did not take medications this morning and well controlled at last visit so will not make any changes at this time.  - Continue Amlodipine 10 mg and Metop 25 mg BID.         Type 2 diabetes mellitus with stage 2 chronic kidney disease, without long-term current use of insulin (CMS-HCC) - Primary     DIABETES PLAN     Dm goal is <8.0%  Lab Results   Component Value Date    A1C 8.7 (H) 10/03/2022    A1C 7.4 (H) 06/06/2022    A1C 6.7 04/06/2022   8.7 today. Unchanged from last check.    BP goal is <130/90  BP Readings from Last 3 Encounters:   10/27/22 151/85   10/03/22 133/80   08/22/22 130/78       The 10-yr ASCVD Risk score Denman George DC Jr., et al., 2013) failed to calculate due to the following reason:  The 2013 10-yr ASCVD risk score is only valid if the patient does not have prior/existing clinical ASCVD (myocardial infarction, stroke, CABG, coronary angioplasty, angina or peripheral arterial disease, coronary atherosclerosis, ischemic heart disease, or cerebrovascular disease). The patient has prior/existing ASCVD.  Had Coronary Angioplasty  Has Angina  Has Coronary Atherosclerosis      Current Diabetes Medications: Metformin 1000 mg PO BID     Today's Recommendations:  Continue Metformin 1000 mg BID. At last visit, attempted to start Jardiance 10 mg once daily. Unable to afford, filled out manufacturer assistance forms today.     Of note, with starting Jardiance discussed history of UTI. No UTI since 2020. Counseled on increased risk in older adults, those with previous infection, and T2DM. Will alert me ASAP if he develops any potential symptoms.     Continue baby aspirin and pravastatin.    Recommend continued regular exercise as tolerated.  Recommend avoiding carbohydrate rich foods and moderation.    Diabetes Quality Metrics    Metric Comments   Most Recent Foot Exam Date: 04/06/2022    Eye Exam date: 01/03/2022    Prescribed aspirin: Yes    Prescribed ACE inhibitor: No OR Prescribed ARBs: No    Prescribed statins: Yes    Pneumonia vaccination: 12/07/2015    Smokes Tobacco: No      Most Recent Diabetes Labs  Lab Results   Component Value Date    LDL 138 (H) 11/18/2021    ALBCRERATIO 3.7 11/28/2016    NA 138 04/28/2022    K 4.4 04/28/2022    CREATININE 0.98 04/28/2022               # Screening and Prevention  - Colorectal Screening:  Brought records from [facility] (scanned) Colonoscopy performed 2015 - three 2-5 mm hyperplastic polyps in the sigmoid colon, internal hemorrhoids, repeat 10 years (2025).  - Hep C Ab:  Negative.  - HIV Ab:  Negative.    # Vaccines  - TDaP: 2017.  - pneumovax: 2016, 2017.  - seasonal influenza: Received.  - COVID:  Received.    I personally spent 35 minutes face-to-face and non-face-to-face in the care of this patient, which includes all pre, intra, and post visit time on the date of service.  Subjective:    Chief Complaint   Patient presents with    Follow-up     Diabetes check in       History of Present Illness  Christopher [name] is a 72 y.o. male who is here for follow-up.    Stressed as wife is in hospital recovering from back surgery.    - HTN: Did not take BP meds this morning.    Physical Exam:  Vitals Reviewed:  BP 151/85 (BP Site: R Arm, BP Position: Sitting, BP Cuff Size: Large) Comment: Average BP - Pulse 66  - Temp 36.4 ??C (97.6 ??F) (Temporal)  - Ht 165.1 cm (5' 5)  - Wt 87.1 kg (192 lb)  - BMI 31.95 kg/m??    Weight:   Wt Readings from Last 3 Encounters:   10/27/22 87.1 kg (192 lb)   10/03/22 86.5 kg (190 lb 12.8 oz)   08/22/22 87.7 kg (193 lb 6.4 oz)     General: well-nourished, no acute distress, pleasant.

## 2022-10-26 NOTE — Unmapped (Signed)
DIABETES PLAN     Dm goal is <8.0%  Lab Results   Component Value Date    A1C 8.7 (H) 10/03/2022    A1C 7.4 (H) 06/06/2022    A1C 6.7 04/06/2022       BP goal is <130/90  BP Readings from Last 3 Encounters:   10/03/22 133/80   08/22/22 130/78   08/21/22 154/86       The 10-yr ASCVD Risk score Denman George DC Jr., et al., 2013) failed to calculate due to the following reason:  The 2013 10-yr ASCVD risk score is only valid if the patient does not have prior/existing clinical ASCVD (myocardial infarction, stroke, CABG, coronary angioplasty, angina or peripheral arterial disease, coronary atherosclerosis, ischemic heart disease, or cerebrovascular disease). The patient has prior/existing ASCVD.  Had Coronary Angioplasty  Has Angina  Has Coronary Atherosclerosis      Current Diabetes Medications: Metformin 1000 mg PO BID     Today's Recommendations:  Continue Metformin 1000 mg BID. At last visit, attempted to start Jardiance 10 mg once daily. Unable to afford, filled out manufacturer assistance forms today.     Of note, with starting Jardiance discussed history of UTI. No UTI since 2020. Counseled on increased risk in older adults, those with previous infection, and T2DM. Will alert me ASAP if he develops any potential symptoms.     Continue baby aspirin and pravastatin.    Recommend continued regular exercise as tolerated.  Recommend avoiding carbohydrate rich foods and moderation.    Diabetes Quality Metrics    Metric Comments   Most Recent Foot Exam Date: 04/06/2022    Eye Exam date: 01/03/2022    Prescribed aspirin: Yes    Prescribed ACE inhibitor: No OR Prescribed ARBs: No    Prescribed statins: Yes    Pneumonia vaccination: 12/07/2015    Smokes Tobacco: No      Most Recent Diabetes Labs  Lab Results   Component Value Date    LDL 138 (H) 11/18/2021    ALBCRERATIO 3.7 11/28/2016    NA 138 04/28/2022    K 4.4 04/28/2022    CREATININE 0.98 04/28/2022

## 2022-10-26 NOTE — Unmapped (Signed)
Well controlled.  - Continue Amlodipine 10 mg and Metop 25 mg BID.

## 2022-10-27 ENCOUNTER — Ambulatory Visit: Admit: 2022-10-27 | Discharge: 2022-10-28 | Payer: MEDICARE

## 2022-10-27 LAB — LIPID PANEL
CHOLESTEROL/HDL RATIO SCREEN: 2.7 (ref 1.0–4.5)
CHOLESTEROL: 147 mg/dL (ref <=200)
HDL CHOLESTEROL: 54 mg/dL (ref 40–60)
LDL CHOLESTEROL CALCULATED: 43 mg/dL (ref 40–99)
NON-HDL CHOLESTEROL: 93 mg/dL (ref 70–130)
TRIGLYCERIDES: 249 mg/dL — ABNORMAL HIGH (ref 0–150)
VLDL CHOLESTEROL CAL: 49.8 mg/dL — ABNORMAL HIGH (ref 12–42)

## 2022-10-27 LAB — BASIC METABOLIC PANEL
ANION GAP: 5 mmol/L (ref 5–14)
BLOOD UREA NITROGEN: 12 mg/dL (ref 9–23)
BUN / CREAT RATIO: 13
CALCIUM: 9.2 mg/dL (ref 8.7–10.4)
CHLORIDE: 105 mmol/L (ref 98–107)
CO2: 30 mmol/L (ref 20.0–31.0)
CREATININE: 0.95 mg/dL
EGFR CKD-EPI (2021) MALE: 84 mL/min/{1.73_m2} (ref >=60)
GLUCOSE RANDOM: 207 mg/dL — ABNORMAL HIGH (ref 70–99)
POTASSIUM: 3.8 mmol/L (ref 3.4–4.8)
SODIUM: 140 mmol/L (ref 135–145)

## 2022-11-17 MED ORDER — DOCUSATE SODIUM 100 MG CAPSULE
ORAL_CAPSULE | Freq: Two times a day (BID) | ORAL | 0 refills | 30.00000 days | Status: CP
Start: 2022-11-17 — End: 2022-11-17

## 2022-11-17 MED ORDER — TRAZODONE 150 MG TABLET
ORAL_TABLET | Freq: Every evening | ORAL | 3 refills | 90 days | Status: CP
Start: 2022-11-17 — End: 2022-11-17

## 2022-11-17 NOTE — Unmapped (Signed)
Medication Request     Patient Name: Christopher [name].   Caller: Self (Patient)  Name of Caller:  Have you contacted your pharmacy? yes      Last Visit: 10/27/2022       Medication Name: traZODone (DESYREL) ; docusate sodium (COLACE)   Dosage:150 MG tablet ; 100 MG capsule   Route: Oral (PO)  Frequency: Nightly ; daily prn  Day Supply Requested: 30  Pharmacy (Name & Address): [facility] - [place], [place] - [address]    Pharmacy Phone Number: [phone]

## 2022-11-23 NOTE — Unmapped (Addendum)
Family Medicine Office Visit    Assessment/Plan    Problem List Items Addressed This Visit          Endocrine    Hypertension associated with type 2 diabetes mellitus (CMS-HCC) - Primary     Well controlled today.  - Continue Amlodipine 10 mg and Metop 25 mg BID.         Type 2 diabetes mellitus with stage 2 chronic kidney disease, without long-term current use of insulin (CMS-HCC)     DIABETES PLAN     Dm goal is <8.0%  Lab Results   Component Value Date    A1C 8.7 (H) 10/27/2022    A1C 8.7 (H) 10/03/2022    A1C 7.4 (H) 06/06/2022       BP goal is <130/90  BP Readings from Last 3 Encounters:   11/24/22 130/79   10/27/22 151/85   10/03/22 133/80       The 10-yr ASCVD Risk score Denman George DC Jr., et al., 2013) failed to calculate due to the following reason:  The 2013 10-yr ASCVD risk score is only valid if the patient does not have prior/existing clinical ASCVD (myocardial infarction, stroke, CABG, coronary angioplasty, angina or peripheral arterial disease, coronary atherosclerosis, ischemic heart disease, or cerebrovascular disease). The patient has prior/existing ASCVD.  Had Coronary Angioplasty  Has Angina  Has Coronary Atherosclerosis      Current Diabetes Medications: Metformin 1000 mg PO BID     Today's Recommendations:  Continue Metformin 1000 mg BID. At last visit, attempted to start Jardiance 10 mg once daily. Unable to afford, filled out manufacturer assistance forms at last visit and re-faxed today as company reports that they have not received it yet.    Of note, with starting Jardiance discussed history of UTI. No UTI since 2020. Counseled on increased risk in older adults, those with previous infection, and T2DM. Will alert me ASAP if he develops any potential symptoms.     Continue baby aspirin and pravastatin.    Recommend continued regular exercise as tolerated.  Recommend avoiding carbohydrate rich foods and moderation.    Diabetes Quality Metrics    Metric Comments   Most Recent Foot Exam Date: 04/06/2022 Eye exam 11/14/2022 with no diabetic retinopathy, scanned under media tab today 11/24/2022   Eye Exam date: 01/03/2022    Prescribed aspirin: Yes    Prescribed ACE inhibitor: No OR Prescribed ARBs: No    Prescribed statins: Yes    Pneumonia vaccination: 12/07/2015    Smokes Tobacco: No      Most Recent Diabetes Labs  Lab Results   Component Value Date    LDL 43 10/27/2022    ALBCRERATIO 3.7 11/28/2016    NA 140 10/27/2022    K 3.8 10/27/2022    CREATININE 0.95 10/27/2022            Relevant Orders    Albumin/creatinine urine ratio         # Screening and Prevention  - Colorectal Screening:  Brought records from [place] (scanned) Colonoscopy performed 2015 - three 2-5 mm hyperplastic polyps in the sigmoid colon, internal hemorrhoids, repeat 10 years (2025).  - Hep C Ab:  Negative.  - HIV Ab:  Negative.    # Vaccines  - TDaP: 2017.  - pneumovax: 2016, 2017.  - seasonal influenza: Received.  - COVID:  Received.    I personally spent 35 minutes face-to-face and non-face-to-face in the care of this patient, which includes all pre, intra, and post  visit time on the date of service.       Subjective:    Chief Complaint   Patient presents with    Follow-up     Diabetes     Hip Pain     Left hip been going on for about a month        History of Present Illness  Alston Berrie is a 72 y.o. male who is here for follow-up.    - T2DM: Reports that company has not yet received paperwork.    Physical Exam:  Vitals Reviewed:  BP 130/79 (BP Site: L Arm, BP Position: Sitting, BP Cuff Size: Medium)  - Pulse 74  - Temp 36.6 ??C (97.8 ??F) (Temporal)  - Ht 167.6 cm (5' 6) Comment: pt reported - Wt 86.2 kg (190 lb)  - BMI 30.67 kg/m??    Weight:   Wt Readings from Last 3 Encounters:   11/24/22 86.2 kg (190 lb)   10/27/22 87.1 kg (192 lb)   10/03/22 86.5 kg (190 lb 12.8 oz)     General: well-nourished, no acute distress, pleasant.

## 2022-11-23 NOTE — Unmapped (Addendum)
DIABETES PLAN     Dm goal is <8.0%  Lab Results   Component Value Date    A1C 8.7 (H) 10/27/2022    A1C 8.7 (H) 10/03/2022    A1C 7.4 (H) 06/06/2022       BP goal is <130/90  BP Readings from Last 3 Encounters:   11/24/22 130/79   10/27/22 151/85   10/03/22 133/80       The 10-yr ASCVD Risk score Denman George DC Jr., et al., 2013) failed to calculate due to the following reason:  The 2013 10-yr ASCVD risk score is only valid if the patient does not have prior/existing clinical ASCVD (myocardial infarction, stroke, CABG, coronary angioplasty, angina or peripheral arterial disease, coronary atherosclerosis, ischemic heart disease, or cerebrovascular disease). The patient has prior/existing ASCVD.  Had Coronary Angioplasty  Has Angina  Has Coronary Atherosclerosis      Current Diabetes Medications: Metformin 1000 mg PO BID     Today's Recommendations:  Continue Metformin 1000 mg BID. At last visit, attempted to start Jardiance 10 mg once daily. Unable to afford, filled out manufacturer assistance forms at last visit and re-faxed today as company reports that they have not received it yet.    Of note, with starting Jardiance discussed history of UTI. No UTI since 2020. Counseled on increased risk in older adults, those with previous infection, and T2DM. Will alert me ASAP if he develops any potential symptoms.     Continue baby aspirin and pravastatin.    Recommend continued regular exercise as tolerated.  Recommend avoiding carbohydrate rich foods and moderation.    Diabetes Quality Metrics    Metric Comments   Most Recent Foot Exam Date: 04/06/2022 Eye exam 11/14/2022 with no diabetic retinopathy, scanned under media tab today 11/24/2022   Eye Exam date: 01/03/2022    Prescribed aspirin: Yes    Prescribed ACE inhibitor: No OR Prescribed ARBs: No    Prescribed statins: Yes    Pneumonia vaccination: 12/07/2015    Smokes Tobacco: No      Most Recent Diabetes Labs  Lab Results   Component Value Date    LDL 43 10/27/2022 ALBCRERATIO 3.7 11/28/2016    NA 140 10/27/2022    K 3.8 10/27/2022    CREATININE 0.95 10/27/2022

## 2022-11-24 ENCOUNTER — Ambulatory Visit: Admit: 2022-11-24 | Discharge: 2022-11-25 | Payer: MEDICARE

## 2022-11-24 DIAGNOSIS — N182 Chronic kidney disease, stage 2 (mild): Principal | ICD-10-CM

## 2022-11-24 DIAGNOSIS — I152 Hypertension secondary to endocrine disorders: Principal | ICD-10-CM

## 2022-11-24 DIAGNOSIS — E1159 Type 2 diabetes mellitus with other circulatory complications: Principal | ICD-10-CM

## 2022-11-24 DIAGNOSIS — E1122 Type 2 diabetes mellitus with diabetic chronic kidney disease: Principal | ICD-10-CM

## 2022-11-24 LAB — ALBUMIN / CREATININE URINE RATIO
ALBUMIN QUANT URINE: 9.8 mg/dL
ALBUMIN/CREATININE RATIO: 72.2 ug/mg — ABNORMAL HIGH (ref 0.0–30.0)
CREATININE, URINE: 135.8 mg/dL

## 2022-11-28 NOTE — Unmapped (Signed)
Medication Request     Patient Name: Christopher [name].   Caller: Self (Patient)  Name of Caller:  Have you contacted your pharmacy? yes      Last Visit: 11/24/2022       Medication Name:   empagliflozin (JARDIANCE) 10 mg tablet    Dosage:10 mg    The patient received a letter from [facility] stating they need a new prescription for the medication faxed to [phone]. The previous Fax did not contain dosage

## 2022-11-29 NOTE — Unmapped (Signed)
Fax sent on 11/29/22 at 9:15 am. Results ok.

## 2022-12-08 NOTE — Unmapped (Signed)
Patient does not need a PA for trospium. Patient is getting this medication from [place] [employer] [facility].

## 2022-12-11 ENCOUNTER — Ambulatory Visit: Admit: 2022-12-11 | Discharge: 2022-12-12 | Payer: MEDICARE

## 2022-12-11 DIAGNOSIS — M353 Polymyalgia rheumatica: Principal | ICD-10-CM

## 2022-12-11 MED ORDER — PREDNISONE 5 MG TABLET
ORAL_TABLET | ORAL | 0 refills | 180 days | Status: CP
Start: 2022-12-11 — End: 2023-06-09

## 2022-12-11 NOTE — Unmapped (Signed)
Important changes to your medical care:     Prednisone 15mg  once daily x 1 month  Prednisone 12.5mg  once daily x 1 month  Prednisone 10mg  once daily x 1 month  Prednisone 7.5mg  once daily x 39month  Prednisone 5 mg once daily x 1 month  Prednisone 2.5 mg once daily x 1 month  Prednisone 1 mg once daily x 1 month       If you need to get a non urgent message to me:   Please go to [email or web address] and sign in to your [facility] Chart.  If you do not have a [facility]  account, we can assist you in getting one.  Please allow up to 72 business hours for responses to My [facility] chart messages. This is NOT for URGENT needs.    If you need to schedule an appointment:     Schedule through [facility] Chart OR call us at [phone]    If you need to request medication refills:  Please request a refill via [facility] Chart OR have your pharmacy send a request electronically. Do not call the clinic to request a refill.    Urgent medical issue after normal business hours, on weekends, or during holidays:    [facility] at [facility] at [address] in [place] provides extended hours on evenings and weekends. Click here for latest info.    Teresita Madura MD  [facility]/Him/His  [facility]   [address]   [place], [place] [address]   [phone] (Phone); [phone] (Fax)     [facility] MyChart is for non-urgent messages. I do my best to respond within 2 business days, however occasionally it may take longer. If you have immediate concerns, contact our clinic by phone [phone]. If you are having an emergency, you should call 911 or present to the Emergency Department.

## 2022-12-11 NOTE — Unmapped (Addendum)
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 He was diagnosed at the beginning of this year and placed on prednisone taper which helped with symptoms. He completed prednisone taper recently but had flare of his symptoms (bilateral hip stiffness, pain with getting up) for the past two weeks. He says this is pretty c/w past PMR flare. Discussed doing another prednisone flare. Opted against chronic prednisone maintenance therapy at this time; but, if he flares again, I'd consider maintenance prednisone therapy.   - See prednisone taper plan as below. He has a pill cutter at home and felt comfortable with this regimen below.  Prednisone 15mg  once daily x 1 month  Prednisone 12.5mg  once daily x 1 month  Prednisone 10mg  once daily x 1 month  Prednisone 7.5mg  once daily x 38month  Prednisone 5 mg once daily x 1 month  Prednisone 2.5 mg once daily x 1 month  Prednisone 1 mg once daily x 1 month    Orders:    predniSONE (DELTASONE) 5 MG tablet; Take 3 tablets (15 mg total) by mouth daily for 30 days, THEN 2.5 tablets (12.5 mg total) daily for 30 days, THEN 2 tablets (10 mg total) daily for 30 days, THEN 1.5 tablets (7.5 mg total) daily for 30 days, THEN 1 tablet (5 mg total) daily for 30 days, THEN 0.5 tablets (2.5 mg total) daily.    predniSONE (DELTASONE) 1 MG tablet; Take 1 tablet (1 mg total) by mouth daily.

## 2022-12-11 NOTE — Unmapped (Signed)
[facility]  Established Patient Clinic Note    Assessment & Plan  Polymyalgia rheumatica (CMS-HCC)  [phone] with diagnosis of PMR p/w bilateral hip pain. He was diagnosed at the beginning of this year and placed on prednisone taper which helped with symptoms. He completed prednisone taper recently but had flare of his symptoms (bilateral hip stiffness, pain with getting up) for the past two weeks. He says this is pretty c/w past PMR flare. Discussed doing another prednisone flare. Opted against chronic prednisone maintenance therapy at this time; but, if he flares again, I'd consider maintenance prednisone therapy.   - See prednisone taper plan as below. He has a pill cutter at home and felt comfortable with this regimen below.  Prednisone 15mg  once daily x 1 month  Prednisone 12.5mg  once daily x 1 month  Prednisone 10mg  once daily x 1 month  Prednisone 7.5mg  once daily x 4month  Prednisone 5 mg once daily x 1 month  Prednisone 2.5 mg once daily x 1 month  Prednisone 1 mg once daily x 1 month    Orders:    predniSONE (DELTASONE) 5 MG tablet; Take 3 tablets (15 mg total) by mouth daily for 30 days, THEN 2.5 tablets (12.5 mg total) daily for 30 days, THEN 2 tablets (10 mg total) daily for 30 days, THEN 1.5 tablets (7.5 mg total) daily for 30 days, THEN 1 tablet (5 mg total) daily for 30 days, THEN 0.5 tablets (2.5 mg total) daily.    predniSONE (DELTASONE) 1 MG tablet; Take 1 tablet (1 mg total) by mouth daily.         Attending: Dr. Boneta Lucks    Subjective   [name] is a 72 y.o. male  coming to clinic today for the following issues:    Chief Complaint   Patient presents with    Hip Pain     Pt states he started having pain in her lower parts of his body for 3 weeks      HPI:    Problem Walking  - The pain is on bilateral hip.   - started two weeks ago.   - the pain has worsened since two weeks ago.   - pain similar to past PMR flare   -  moving makes the pain worse; stiffness in hips  - no fever    I have reviewed the problem list, medications, and allergies and have updated/reconciled them if needed.    [name]  reports that he has never smoked. He has never used smokeless tobacco.  Health Maintenance   Topic Date Due    Medicare Annual Wellness Visit (AWV)  Never done    Zoster Vaccines (2 of 3) 06/16/2014    COVID-19 Vaccine (5 - 2023-24 season) 05/16/2022    Retinal Eye Exam  01/04/2023    Influenza Vaccine (1) 12/24/2022    Hemoglobin A1c  01/27/2023    Foot Exam  04/07/2023    Colon Cancer Screening  07/09/2023    Serum Creatinine Monitoring  10/27/2023    Potassium Monitoring  10/27/2023    Urine Albumin/Creatinine Ratio  11/24/2023    DTaP/Tdap/Td Vaccines (2 - Td or Tdap) 05/25/2025    Pneumococcal Vaccine 65+  Completed    Hepatitis C Screen  Completed       Objective     VITALS: BP 135/83  - Pulse 76  - Temp 36.6 ??C (97.8 ??F) (Temporal)  - Ht 167.6 cm (5' 6)  -  Wt 88.9 kg (196 lb)  - BMI 31.64 kg/m??     Physical Exam  Constitutional:       General: He is not in acute distress.  Musculoskeletal:      Comments: Pain with standing noted.   Pain with passive hip flexion, FADIR/FABER bilaterally.  No midline back pain present.    Neurological:      Mental Status: He is alert.         LABS/IMAGING  I have reviewed pertinent recent labs and imaging in Epic    2021 XR Hip: Moderate bilateral hip osteoarthrosis, slightly progressed since 2020.     [facility]  [place] [facility]  [address] [place], [place] [address]  Telephone [phone]  Fax [phone]  [email or web address]

## 2022-12-12 NOTE — Unmapped (Signed)
I saw and evaluated the patient, participating in the key portions of the service.  I reviewed the resident’s note.  I agree with the resident’s findings and plan. Karen Debra Halpert, MD

## 2022-12-15 DIAGNOSIS — I209 Angina pectoris, unspecified: Principal | ICD-10-CM

## 2022-12-15 DIAGNOSIS — I240 Acute coronary thrombosis not resulting in myocardial infarction: Principal | ICD-10-CM

## 2022-12-15 DIAGNOSIS — I259 Chronic ischemic heart disease, unspecified: Principal | ICD-10-CM

## 2022-12-15 DIAGNOSIS — Z789 Other specified health status: Principal | ICD-10-CM

## 2022-12-15 DIAGNOSIS — E1169 Type 2 diabetes mellitus with other specified complication: Principal | ICD-10-CM

## 2022-12-15 DIAGNOSIS — E785 Hyperlipidemia, unspecified: Principal | ICD-10-CM

## 2022-12-15 MED ORDER — PRALUENT PEN 75 MG/ML SUBCUTANEOUS PEN INJECTOR
SUBCUTANEOUS | 11 refills | 28 days | Status: CP
Start: 2022-12-15 — End: 2023-12-15
  Filled 2022-12-19: qty 6, 84d supply, fill #0

## 2022-12-15 MED ORDER — TRUE METRIX GLUCOSE TEST STRIP
ORAL_STRIP | 2 refills | 0 days | Status: CP
Start: 2022-12-15 — End: ?

## 2022-12-15 NOTE — Unmapped (Signed)
[facility] Refill Coordination Note    Specialty Lite Medication(s) to be Shipped:   Praluent    Other medication(s) to be shipped: No additional medications requested for fill at this time     Christopher [name], DOB: 1951-01-15  Phone: [phone] (home)       All above HIPAA information was verified with patient.     Was a Nurse, learning disability used for this call? No    Changes to medications: [name] reports no changes at this time.  Changes to insurance: No      REFERRAL TO PHARMACIST     Referral to the pharmacist: Not needed      [facility]     Shipping address confirmed in Epic.     Delivery Scheduled: Yes, Expected medication delivery date: 8/28, requested 84 day supply.  However, Rx request for refills was sent to the provider as there are none remaining.     Medication will be delivered via UPS to the prescription address in Epic WAM.    Westley Gambles   [facility] Specialty Technician

## 2022-12-15 NOTE — Unmapped (Signed)
Medication Request     Patient Name: Christopher [name].   Caller: Self (Patient)  Name of Caller:  Have you contacted your pharmacy? yes      Last Visit: 12/11/2022       Medication Name: empagliflozin (JARDIANCE)   Dosage:10 mg tablet   Route: Oral (PO)  Frequency: Daily  Day Supply Requested: 83  Pharmacy (Name & Address): [facility] Number: [phone]      Pharmacy is requesting that the medicine be faxed to [phone]. PT is requesting that the prescription be resubmitted after the pharmacy rejected it  due to it not having a dosage or signature. PT is asking that the prescription be completed in totality so that the pharmacy will release the medication.

## 2022-12-15 NOTE — Unmapped (Signed)
Patient is requesting the following refill  Requested Prescriptions     Pending Prescriptions Disp Refills    alirocumab (PRALUENT PEN) 75 mg/mL PnIj 2 mL 11     Sig: Inject the contents of 1 pen (75 mg) under the skin every fourteen (14) days.       Recent Visits  Date Type Provider Dept   12/11/22 Office Visit Teresita Madura, MD [facility] Family Medicine [place]   11/24/22 Office Visit Amada Kingfisher, MD [facility] Family Medicine [place]   10/27/22 Office Visit Amada Kingfisher, MD [facility] [place]   10/03/22 Office Visit Amada Kingfisher, MD [facility] Medicine [place]   08/22/22 Office Visit Lorretta Harp, MD [facility] Cardiology Primary Care [facility]   07/04/22 Telemedicine Katrinka Blazing, Tawni Millers, MD [facility] Family Medicine [place]   06/06/22 Office Visit Amada Kingfisher, MD [facility] Family Medicine [place]   05/25/22 Office Visit Lorretta Harp, MD [facility] Cardiology Primary Care [place]   05/04/22 Office Visit Maisie Fus, MD [facility] Family Medicine [facility]   04/28/22 Office Visit Candie Chroman, DO [facility] [place]   Showing recent visits within past 365 days and meeting all other requirements  Future Appointments  Date Type Provider Dept   01/10/23 Appointment Amada Kingfisher, MD [facility] Family Medicine [facility]   02/27/23 Appointment Hope, Deatra Robinson, [place] [facility] Primary [facility]   Showing future appointments within next 365 days and meeting all other requirements       Labs: Vitals:   BP Readings from Last 3 Encounters:   12/11/22 135/83   11/24/22 130/79   10/27/22 151/85    and   Pulse Readings from Last 3 Encounters:   12/11/22 76   11/24/22 74   10/27/22 66

## 2023-01-09 NOTE — Unmapped (Signed)
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  - Continue Amlodipine 10 mg and Metop 25 mg BID.            Musculoskeletal and Integument    Tinea cruris     Found incidentally while examining hip. Will treat with Clotrimazole x 2 weeks.         Relevant Medications    clotrimazole (LOTRIMIN) 1 % cream    Skin lesion     Discussed that it is difficult to tell exact diagnosis because of secondary scabing. Would benefit from biopsy for definitive diagnosis due to age, location and sun exposure. Previously seen by dermatology in [place] (last appointment 09/2019). Recommend follow-up, referral placed today, discussed calling for an appointment.         Relevant Orders    Ambulatory referral to Dermatology       Other    Polymyalgia rheumatica (CMS-HCC)     - Diagnosed January 2024 with profound fatige and weakness. Completed extended prednisone taper and felt improved. Seen August 2024 for bilateral hip pain for prior two weeks (bilateral hip stiffness, pain with getting up) consistent with previous PMR flare. Started on prednisone taper again (see below). Counseled to take first thing in the morning to help with sleep.     Taper as below:  - 15mg  daily for 4 weeks   - 12.5mg  daily for 4 weeks   - 10 mg daily for 4 weeks   - 7.5mg  for 4 weeks   - 5 mg for 4 weeks   - 2.5mg  for 4 weeks   - 1mg  for 4 weeks          Left hip pain - Primary     Unclear etiology. No trauma. Diagnosed with PMR earlier this year along with recent flare so started on prednisone taper again. Describes this pain as different from PMR flare. Bilateral hip XR with pelvis moderate bilateral hip osteoarthrosis slightly progressed since 2021. With history of extensive OA and back pain, offered referral to orthopedic surgery which patient will consider.         Relevant Orders    XR Hips Bilateral with Pelvis 5 views or more (Completed)       # Screening and Prevention  - Colorectal Screening:  Brought records from [place] (scanned) Colonoscopy performed 2015 - three 2-5 mm hyperplastic polyps in the sigmoid colon, internal hemorrhoids, repeat 10 years (2025).  - Hep C Ab:  Negative.  - HIV Ab:  Negative.    # Vaccines  - TDaP: 2017.  - pneumovax: 2016, 2017.  - seasonal influenza: Received.  - COVID:  Received.    I personally spent 35 minutes face-to-face and non-face-to-face in the care of this patient, which includes all pre, intra, and post visit time on the date of service.       Subjective:    Chief Complaint   Patient presents with    Follow-up       History of Present Illness  Christopher [name] is a 72 y.o. male who is here for follow-up.    - Seen 9/19 for flare of PMR. Prescribed prednisone taper.  - Thinks that symptoms are improving on prednisone.  -  Still having L buttocks into left groin pain. Constant 9/10. Worse with initially starting movement of left leg. Worse with hip flexion. Difficulty putting foot in shoe. No falls or accidents.No improvement with prednisone. No N/T.  - No dysuria. No testicular pain or swelling.    Right ear lesion: started off scaly, present for about 3 months, not itchy    Has still not received Jardiance.    Physical Exam:  Vitals Reviewed:  BP 144/81 Comment: average bp - Pulse 64  - Temp 36.6 ??C (97.9 ??F) (Temporal)  - Ht 165.1 cm (5' 5) Comment: pt reported - Wt 85.4 kg (188 lb 3.2 oz)  - BMI 31.32 kg/m??    Weight:   Wt Readings from Last 3 Encounters:   01/10/23 85.4 kg (188 lb 3.2 oz)   12/11/22 88.9 kg (196 lb)   11/24/22 86.2 kg (190 lb)     General: well-nourished, no acute distress, pleasant.   MSK: No L spine tenderness, no tenderness over hip or greater trochanteric bursa. TTP over L inguinal canal and L buttocks. 5/5 strength with hip abduction, adduction, knee flexion, extension, dorsiflexion, plantarflexion. 4/5 strength with hip flexion 2/2 pain. Sensation intact distally. Antalgic gait.  Skin: left inguinal crease with erythematous plaque with central clearing; right ear concha with approximately 5 mm scabbing (see photo below)

## 2023-01-10 ENCOUNTER — Ambulatory Visit: Admit: 2023-01-10 | Discharge: 2023-01-11 | Payer: MEDICARE

## 2023-01-10 MED ORDER — CLOTRIMAZOLE 1 % TOPICAL CREAM
Freq: Two times a day (BID) | TOPICAL | 0 refills | 0 days | Status: CP
Start: 2023-01-10 — End: ?

## 2023-01-10 NOTE — Unmapped (Signed)
-   Diagnosed January 2024 with profound fatige and weakness. Completed extended prednisone taper and felt improved. Seen August 2024 for bilateral hip pain for prior two weeks (bilateral hip stiffness, pain with getting up) consistent with previous PMR flare. Started on prednisone taper again (see below). Counseled to take first thing in the morning to help with sleep.     Taper as below:  - 15mg  daily for 4 weeks   - 12.5mg  daily for 4 weeks   - 10 mg daily for 4 weeks   - 7.5mg  for 4 weeks   - 5 mg for 4 weeks   - 2.5mg  for 4 weeks   - 1mg  for 4 weeks

## 2023-01-10 NOTE — Unmapped (Signed)
Abbie Sons, MD  [address]  [address]  [place] [address]  [phone]

## 2023-01-11 NOTE — Unmapped (Signed)
Found incidentally while examining hip. Will treat with Clotrimazole x 2 weeks.

## 2023-01-11 NOTE — Unmapped (Signed)
Unclear etiology. No trauma. Diagnosed with PMR earlier this year along with recent flare so started on prednisone taper again. Describes this pain as different from PMR flare. Bilateral hip XR with pelvis moderate bilateral hip osteoarthrosis slightly progressed since 2021. With history of extensive OA and back pain, offered referral to orthopedic surgery which patient will consider.

## 2023-01-11 NOTE — Unmapped (Signed)
Discussed that it is difficult to tell exact diagnosis because of secondary scabing. Would benefit from biopsy for definitive diagnosis due to age, location and sun exposure. Previously seen by dermatology in [place] (last appointment 09/2019). Recommend follow-up, referral placed today, discussed calling for an appointment.

## 2023-01-11 NOTE — Unmapped (Signed)
Not well controlled today; however, in pain so will not make any changes.  - Continue Amlodipine 10 mg and Metop 25 mg BID.

## 2023-02-07 DIAGNOSIS — B356 Tinea cruris: Principal | ICD-10-CM

## 2023-02-07 MED ORDER — CLOTRIMAZOLE 1 % TOPICAL CREAM
Freq: Two times a day (BID) | TOPICAL | 11 refills | 0 days | Status: CP
Start: 2023-02-07 — End: ?

## 2023-02-08 NOTE — Unmapped (Signed)
[facility] FAMILY MEDICINE [place] POPULATION HEALTH  Care Management Progress Note    Date: 02/08/2023  Outcome:  Phone outreach completed    Purpose of contact:           Follow up on CCM (HTN, pain, meds). CM discussed the following with the Christopher [name]:    Pain  Christopher [name] reports hip pain - got appointment to see an orthopedic doctor on 22nd at 2pm with Dr. Izola Price.     Medication  Christopher [name] reports never getting the jardiance, unable to afford $500. Christopher [name] applied to the program 1-2 months ago via [employer] Cares Patient Assistance Program but never got a status update. CM contacted the company [phone]) to check on the status of the application. Agent checking on the status, CM was put on hold then disconnected. CM sent a form via the company website for app status check request.   Duration of the call: 78 minutes    CM will call Christopher [name] back at 3:30 PM on 10/18 to complete rest of the outreach regarding diabetes & HTN.     Lab Results   Component Value Date    A1C 8.7 (H) 10/27/2022       Diabetes:  Experiencing symptoms of high blood sugar?: no  Experiencing symptoms of low blood sugar?: no  Know how to treat low blood sugar?: yes  Experiencing symptoms of numbness or tingling in fingers or toes?: no  Regularly checking blood sugars?: yes  If yes, when? before breakfast, after breakfast, or before bed  Typical daily diet: well-balanced effort  Weekly physical activity: 30-60 mins    130-140 range  157 on 10/15 when he ate high sugar desert    Educational points mentioned:  Goals: fasting blood glucose (in the morning before eating) is 80-130, and 2 hours after meals <180  Treating low blood sugars (the Rule of 15):   check blood sugar; if it is less than 70 it is considered hypoglycemic  treat with 15 grams of fasting acting carbohydrates like 1/2 cup of juice or regular soda, OR 3-4 glucose tablets and recheck blood sugar in 15 minutes  If it is back above 70, continue with routine as normal and eat a snack or a small meal  If it is still below 70, drink 1/2 cup more of juice or regular soda OR take 3-4 more glucose tablets and recheck again after 15 minutes  Diet: restrict carbs (bread, cereal, pasta) and sugary foods and drinks (especially soda and lemonade), as these will increase blood sugars. Many fruits (watermelon, pineapple, mangos, cherries, oranges, grapes, ripe bananas) will also increase blood sugar, even though they aren't traditional desserts.  Exercise: guidelines recommend 150 minutes of moderate-to-vigorous aerobic exercise weekly (walking, going to the gym) along with strength training 2-3 times per week  Be sure to be keeping a log of blood sugars and bring the log and/or glucometer to appointments  Be sure to discuss other diabetic-related health maintenance items: annual dilated eye exam, annual foot exam, annual dental exam, etc.   and Hypertension:  Have blood pressure cuff at home?: yes- arm cuff  Regularly checking blood pressure?: yes  Experienced really high blood pressure (systolic >180 mmHg or diastolic >110 mmHg)?: no  Experienced really low blood pressure (systolic <90 mmHg or diastolic <60 mmHg)?: no  Symptoms of low blood pressures? no  Symptoms of high blood pressures? no  How many times in the past 2 weeks have you missed doses of your blood pressure medications? 0  Typical daily diet: well-balanced  Weekly physical activity: 30-60 mins      PTHomeBP     The patient???s Average Home Blood Pressure during the last two weeks is : 120  / 78  based on  readings           Total number of home blood pressures documented over past two weeks: 14        Educational points mentioned:  Avoid sodium-rich foods (salty foods) and stay hydrated  Fast food, deli meat, and canned foods can have a lot of salt in them which will raise your blood pressure  Exercise and a healthy diet can promote weight loss, which is the best thing for lowering blood pressure  Fruits, vegetables, and low-fat dairy products can help to lower blood pressure  Monitor blood pressure at home if possible  Goal: <130/80 per PCP note    Barriers to Care:  Medication affordability: [employer] Patient Assistance Program in progress    Provider/Care Partner(s) to follow up on:   N/A    Health Maintenance:  Health Maintenance Due   Topic Date Due    Medicare Annual Wellness Visit (AWV)  06/05/2014    Zoster Vaccines (2 of 3) 06/16/2014    COVID-19 Vaccine (5 - 2024-25 season) 12/24/2022    Retinal Eye Exam  01/04/2023    Hemoglobin A1c  01/27/2023         Additional Information/Plan:  Patient provided my direct contact information and encouraged to contact me should additional needs arise.    Time Spent Per Day:  Chart review was completed prior to outreach attempt.   02/08/2023: 5  02/09/2023 : 92    Durene Romans Buck Mcaffee  Population Health  [facility] FAMILY MEDICINE [place]

## 2023-02-13 ENCOUNTER — Ambulatory Visit: Admit: 2023-02-13 | Discharge: 2023-02-14 | Payer: MEDICARE | Attending: Family | Primary: Family

## 2023-02-13 NOTE — Unmapped (Signed)
Plan: Tylenol and ice or heat as needed for pain.  Activity as you tolerate.      Thank you for coming to [facility] and our clinic today!     We aim to provide you with the highest quality, individualized care.  If you have any unanswered questions after the visit, please do not hesitate to reach out to [place] on MyChart or leave a message for the nurse.  ?  MyChart messages: These messages can be sent to your provider and will be checked by their clinical support staff.? The messages are checked throughout the day during normal business hours from 8:30 am-4:00 pm Monday-Friday, however responses may take up to 48 hours.? Please use this method of communication for non-urgent and non-emergent concerns, questions, refill requests or inquiries only.? ?Our team will help respond to all of your questions.? Please note that you may be asked to see a provider by either a telehealth or in person visit if it is deemed your questions are best handled in the clinic setting in person.??  ?  Please keep in mind, these messages are not real time communications, so be patient when waiting for a response.    If you do not have access to MyChart, do not know how to use MyChart or have an issue that may require more extensive discussion, please call the nurses' call line: [phone].? This line is checked throughout the day and will be responded to as time allows.? Please note that return calls could take up to 48 hours, depending on the nature of the need.?  ?  If you have an issue that requires emergent attention that cannot wait; either call the Orthopaedics resident on call at [phone], consider coming to our [facility] walk-in clinic, or go to the nearest Emergency Department.    If you need to schedule future appointments, please call [phone].     We look forward to seeing you again in the future and appreciate you choosing [place] for your care!    Thank you,                We provide innovative and comprehensive patient centered care that is supported by evidence-based research                                                                                                    RESEARCH PARTICIPATION    Please check out our current research studies to see if you or someone you know may qualify at:    [email or web address]

## 2023-02-13 NOTE — Unmapped (Signed)
SPORTS MEDICINE RETURN VISIT    ASSESSMENT AND PLAN     Checking     Recommended follow-up with one of our nonoperative sports medicine physicians for consideration of ultrasound-guided steroid injection into the left hip.  Continue with activity as tolerated limitation for pain.  Over-the-counter analgesia as needed for pain.    Return for Nonop sports MD procedure left hip injection.    Procedure(s):  None      SUBJECTIVE     Chief Complaint: No chief complaint on file.      History of Present Illness: 72 y.o. male who presents for L hip pain for about 3 months that began without injury or activity change. Pain and difficulty when trying to cross leg. Deep anterior hip pain. No prior symptoms. Pain at rest and with walking.     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    Corneal abrasion 1975    steel in right eye with abrasion and removal    Cubital tunnel syndrome on left 01/16/2019    Depression 2016    Diabetes mellitus (CMS-HCC)     GERD (gastroesophageal reflux disease) 1982    Hypertension     Meningitis 12/25/2015    Mixed hyperlipidemia     Myocardial infarction (CMS-HCC) 1986    Obesity 1986    first back surgery    Peptic ulceration          OBJECTIVE     Physical Exam:  Vitals:   Wt Readings from Last 3 Encounters:   01/10/23 85.4 kg (188 lb 3.2 oz)   12/11/22 88.9 kg (196 lb)   11/24/22 86.2 kg (190 lb)     Estimated body mass index is 31.32 kg/m?? as calculated from the following:    Height as of 01/10/23: 165.1 cm (5' 5).    Weight as of 01/10/23: 85.4 kg (188 lb 3.2 oz).  Gen: Well-appearing male in no acute distress  MSK: Gait is steady.  No tenderness to palpation left lateral hip or buttock.  No pain with gentle logroll of the left hip.  Pain with FADIR.  No pain with FABER.  Negative seated straight leg raise.  No pain with cross body adduction.  Bilateral lower extremity strength 5/5.  Knee    Imaging/other tests: No new x-rays taken today.  X-rays of bilateral hips dated 01/10/2023 with supine views only reviewed and show:  No acute fracture. Joint alignment is normal. Mild hip narrowing with osteophyte formation and sclerosis. Partially imaged interbody fusion hardware in the lower lumbar spine. Hernia mesh markers overlie the right pelvis.    @SPORTSPROMIS @      ADMINISTRATIVE     I have personally reviewed and interpreted the images (as available).  Point-of-care ultrasound imaging is on file and stored in a permanent location (if performed).  I have personally reviewed prior records and incorporated relevant information above (as available).    @SMIBILLING @    PROCEDURES     Procedures     DME     DME ORDER:  Dx:  ,

## 2023-02-26 DIAGNOSIS — E1122 Type 2 diabetes mellitus with diabetic chronic kidney disease: Principal | ICD-10-CM

## 2023-02-26 DIAGNOSIS — N182 Chronic kidney disease, stage 2 (mild): Principal | ICD-10-CM

## 2023-02-26 DIAGNOSIS — F32A Depression, unspecified depression type: Principal | ICD-10-CM

## 2023-02-26 MED ORDER — OMEPRAZOLE 20 MG CAPSULE,DELAYED RELEASE
ORAL_CAPSULE | Freq: Every day | ORAL | 3 refills | 90 days
Start: 2023-02-26 — End: ?

## 2023-02-26 MED ORDER — METFORMIN 1,000 MG TABLET
ORAL_TABLET | 3 refills | 0 days
Start: 2023-02-26 — End: ?

## 2023-02-26 MED ORDER — SERTRALINE 100 MG TABLET
ORAL_TABLET | Freq: Every day | ORAL | 3 refills | 90 days
Start: 2023-02-26 — End: ?

## 2023-02-26 MED ORDER — TRUEPLUS LANCETS 33 GAUGE
3 refills | 0 days
Start: 2023-02-26 — End: ?

## 2023-02-26 MED ORDER — PRAVASTATIN 10 MG TABLET
ORAL_TABLET | Freq: Every day | ORAL | 3 refills | 90 days | Status: CP
Start: 2023-02-26 — End: ?

## 2023-02-27 ENCOUNTER — Ambulatory Visit: Admit: 2023-02-27 | Discharge: 2023-02-28 | Payer: MEDICARE

## 2023-02-27 DIAGNOSIS — N182 Chronic kidney disease, stage 2 (mild): Principal | ICD-10-CM

## 2023-02-27 DIAGNOSIS — I2510 Atherosclerotic heart disease of native coronary artery without angina pectoris: Principal | ICD-10-CM

## 2023-02-27 DIAGNOSIS — E1159 Type 2 diabetes mellitus with other circulatory complications: Principal | ICD-10-CM

## 2023-02-27 DIAGNOSIS — E782 Mixed hyperlipidemia: Principal | ICD-10-CM

## 2023-02-27 DIAGNOSIS — M353 Polymyalgia rheumatica: Principal | ICD-10-CM

## 2023-02-27 DIAGNOSIS — I152 Hypertension secondary to endocrine disorders: Principal | ICD-10-CM

## 2023-02-27 DIAGNOSIS — G459 Transient cerebral ischemic attack, unspecified: Principal | ICD-10-CM

## 2023-02-27 DIAGNOSIS — E1122 Type 2 diabetes mellitus with diabetic chronic kidney disease: Principal | ICD-10-CM

## 2023-02-27 NOTE — Unmapped (Signed)
ESTABLISHED PATIENT VISIT    Date of Encounter: 02/27/2023  Patient Name: Christopher [name] [birth year] 72 y.o.    Primary Care Provider:  Amada Kingfisher, MD  Primary Cardiologist: Dr. Novella [name]      Reason for visit: Christopher [name]  is a 72 y.o. male who is seen in follow up for CAD.       ASSESSMENT/PLAN .     1. Coronary artery disease involving native coronary artery of native heart without angina pectoris  s/p PCI to LAD/D1 2004; S/p 2V CABG 02/2022 (LIMA to LAD, RSVG to OM). SOB and chest pain were his presenting symptoms. Normal LVEF. No ischemic symptoms. Currently on ASA, BB, and Praluent.    2. TIA (transient ischemic attack)  Presented 05/2020 to [facility] ER with dysarthria and right sided weakness. Symptoms resolved. Ziopatch without afib. Continue aggressive RF modification (BP, HLD, DM). On ASA and Praluent    3. Hypertension associated with type 2 diabetes mellitus (CMS-HCC)  BP 128/78 today. Goal BP <130/80.  Currently on amlodipine 10mg  daily and Lopressor 25mg  BID    4. Mixed hyperlipidemia  LDL 43 by labs 10/2022. Goal LDL <70. On pravastatin 10mg  daily and praluent.  He would like to stop pravastatin which is reasonable. Continue to monitor with lipid panel.     5. Type 2 diabetes mellitus with stage 2 chronic kidney disease, without long-term current use of insulin (CMS-HCC)  A1c 8.7% by labs 10/2022. PCP prescribed Jardiance, but due to financial constraints he is working to see if he qualifies for a medication assistance program. Continue to follow with PCP for management.    6. Polymyalgia rheumatica (CMS-HCC)  Continue to follow with PCP for management.       Disposition: Return to clinic in 6mos.    Christopher Spanish Shanyn Preisler, PA-C  02/27/2023, 8:58 AM    Accessory Clinical Findings . I independently reviewed cardiology notes, echo, lipid panel, A1c, BMP    Echo 02/2022 - EF >55%, grade 1 DD, no significant valvular abnormalities     History of Present Illness    Christopher [name] is a 72 y.o. male with PMHx as below. Marland KitchenHe was last evaluated in clinic in April. He is overall doing well. He stays busy doing yard work. No exertional chest pain or sob. No orthopnea, edema, or syncope.  No melena/hematochezia or hematuria. BP 120s/70s at home.       Past Medical History   Past Medical History:   Diagnosis Date    Acute kidney injury (CMS-HCC) 01/12/2018    Anemia     IRON DEFICIENT     Anxiety dont know    Arthritis     Basal cell carcinoma     Cataract     CHF (congestive heart failure) (CMS-HCC)     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    Corneal abrasion 1975    steel in right eye with abrasion and removal    Cubital tunnel syndrome on left 01/16/2019    Depression 2016    Diabetes mellitus (CMS-HCC)     GERD (gastroesophageal reflux disease) 1982    Hypertension     Meningitis 12/25/2015    Mixed hyperlipidemia     Myocardial infarction (CMS-HCC) 1986    Obesity 1986    first back surgery    Peptic ulceration      Past Surgical History:   Procedure Laterality Date    BACK SURGERY  CARDIAC CATHETERIZATION      STENTS PLACEMENT     CARDIAC SURGERY  2005    stents    CORONARY STENT PLACEMENT      FOREIGN BODY REMOVAL Right about 20 years ago    HERNIA REPAIR      RIGHT INGUNIAL    JOINT REPLACEMENT Left     TOTAL SHOULDER    PR ARM/ELBOW TENDON LENGTHEN,SINGLE,EA Left 02/17/2019    Procedure: TENDON LENGTHENING UPPER ARM/ELBOW SNGL EA;  Surgeon: Daisy Lazar, MD;  Location: ASC OR [facility];  Service: Orthopedics    PR CABG, ARTERIAL, SINGLE Midline 03/20/2022    Procedure: CORONARY ARTERY BYPASS, USING ARTERIAL GRAFT(S); SINGLE ARTERIAL GRAFT;  Surgeon: Arlester Marker, MD;  Location: MAIN OR [facility];  Service: Cardiac Surgery    PR CABG, ARTERY-VEIN, SINGLE Midline 03/20/2022    Procedure: CORONARY ARTERY BYPASS, USING VENOUS GRAFT(S) AND ARTERIAL GRAFT(S); SINGLE VEIN GRAFT;  Surgeon: Arlester Marker, MD;  Location: MAIN OR [facility];  Service: Cardiac Surgery    PR CATH PLACE/CORON ANGIO, IMG SUPER/INTERP,W LEFT HEART VENTRICULOGRAPHY N/A 02/01/2022    Procedure: Left Heart Catheterization;  Surgeon: Jacquelyne Balint, MD;  [facility] CATH;  Service: Cardiology    PR ENDOSCOPY W/VIDEO-ASST VEIN HARVEST,CABG Right 03/20/2022    Procedure: ENDOSCOPY, SURGICAL, INCLUDING VIDEO-ASSISTED HARVEST OF VEIN(S) FOR CORONARY ARTERY BYPASS PROCEDURE;  Surgeon: Arlester Marker, MD;  Location: MAIN OR [facility];  Service: Cardiac Surgery    PR OSTEOTOMY 1ST METATARSAL,BASE/SHAFT Left 07/20/2017    Procedure: OSTEOTOMY, W/WO LENGTHENING, SHORTENING OR ANGULAR CORRECTION, METATARSAL; FIRST METATARSAL;  Surgeon: Valda Favia, MD;  Location: ASC OR [facility];  Service: Ortho Foot & Ankle    PR OSTEOTOMY HEEL BONE Left 07/20/2017    Procedure: OSTEOTOMYLoistine Chance W/WO INT FIXA;  Surgeon: Valda Favia, MD;  Location: [facility] OR [facility];  Service: Ortho Foot & Ankle    PR OSTEOTOMY METATARSAL (NOT 1ST) Left 06/06/2018    Procedure: OSTEOTOMY, W/WO LENGTHENING, SHORTENING OR ANGULAR CORRECTION, METATARSAL; OTHER THAN FIRST METATARSAL, EA;  Surgeon: Valda Favia, MD;  Location: ASC OR [facility];  Service: Orthopedics    PR PART REMV PHALANX OF TOE Left 06/06/2018    Procedure: PART EXC BONE PHALANX TOE;  Surgeon: Valda Favia, MD;  Location: ASC OR [facility];  Service: Orthopedics    PR RECONSTR TOTAL SHOULDER IMPLANT Left 11/12/2018    Procedure: R28   ARTHROPLASTY, GLENOHUMERAL JOINT; TOTAL SHOULDER(GLENOID & PROXIMAL HUMERAL REPLACEMENT(EG, TOTAL SHOULDER);  Surgeon: Nickola Major, MD;  [facility];  Service: Orthopedics    PR REMOVAL IMPLANT DEEP Left 11/12/2018    Procedure: REMOVE IMPLANT; DEEP SHOULDER;  Surgeon: Nickola Major, MD;  Location: [facility] OR [facility];  Service: Orthopedics    PR REPAIR BICEPS LONG TENDON Left 11/12/2018    Procedure: TENODESIS LONG TENDON BICEPS;  Surgeon: Nickola Major, MD;  Location: [facility] OR [facility];  Service: Orthopedics    PR REPAIR PERONEAL TENDONS,FIB OSTEOTMY Left 07/20/2017    Procedure: REPR DISLOC PERONEAL TENDONS; W/FIB OSTEOTOMY;  Surgeon: Valda Favia, MD;  Location: ASC OR [facility];  Service: Ortho Foot & Ankle    PR REVISE MEDIAN N/CARPAL TUNNEL SURG Left 02/17/2019    Procedure: R22 NEUROPLASTY AND/OR TRANSPOSITION; MEDIAN NERVE AT CARPAL TUNNEL;  Surgeon: Daisy Lazar, MD;  Location: ASC OR [facility];  Service: Orthopedics    PR REVISE ULNAR NERVE AT ELBOW Left 02/17/2019    Procedure: NEUROPLASTY AND/OR  TRANSPOSITION; ULNAR NERVE AT ELBOW;  Surgeon: Theodora Blow Jacqlyn Krauss, MD;  Location: ASC OR [facility];  Service: Orthopedics    SHOULDER SURGERY Bilateral     L TSA, R ROTATOR CUFF      SKIN BIOPSY      SPINE SURGERY  1986/1995/2000/2002    LUMBAR FUSION    stent         Current Allergy List  Allergies   Allergen Reactions    Morphine Hives    Atorvastatin      neuropathy    Atorvastatin Calcium      neuropathy    Baclofen      headache  headache    Ibuprofen      GI bleed    Pneumococcal 23-Valent Polysaccharide Vaccine Other (See Comments)     Redness and pain at injection site. Fatigue  Pt stated his arm swelled within 24hrs after injection    Zetia [Ezetimibe] Other (See Comments)     Pt states it effected his brain  Pt stated it effected his sight, I couldn't drive a car at all       Current Medication List  Current Outpatient Medications   Medication Sig Dispense Refill    alirocumab (PRALUENT PEN) 75 mg/mL PnIj Inject the contents of 1 pen (75 mg) under the skin every fourteen (14) days. 2 mL 11    amlodipine (NORVASC) 10 MG tablet Take 1 tablet (10 mg total) by mouth daily. For high blood pressure 90 tablet 3    aspirin (ECOTRIN) 81 MG tablet Take 1 tablet (81 mg total) by mouth daily. 30 tablet 11    blood glucose control, low (TRUE METRIX LEVEL 1) Soln For glucometer machine 1 each 2    blood sugar diagnostic (TRUE METRIX GLUCOSE TEST STRIP) Strp CHECK BLOOD SUGAR AS DIRECTED ONCE A DAY AND FOR SYMPTOMS OF HIGH OR LOW BLOOD SUGAR. 100 strip 2    blood-glucose meter kit Use as instructed. 1 each 0    clotrimazole (LOTRIMIN) 1 % cream APPLY TOPICALLY TWO (2) TIMES A DAY. 45 g 11    docusate sodium (COLACE) 100 MG capsule Take 1 capsule (100 mg total) by mouth two (2) times a day. 60 capsule 0    fluticasone propionate (FLONASE) 50 mcg/actuation nasal spray 2 sprays into each nostril daily. 48 mL 3    gabapentin (NEURONTIN) 300 MG capsule Take 2 capsules (600 mg total) by mouth two (2) times a day. 180 capsule 3    lancets Misc Disp. lancets #100 or amount allowed, Testing Qday. Dx: E11.9 (Diabetes- controlled) 100 each 3    metFORMIN (GLUCOPHAGE) 1000 MG tablet TAKE 1 TABLET BY MOUTH IN THE MORNING AND 1 TABLET IN THE EVENING WITH MEALS. 180 tablet 1    metoPROLOL tartrate (LOPRESSOR) 25 MG tablet Take 1 tablet (25 mg total) by mouth two (2) times a day. 180 tablet 3    omeprazole (PRILOSEC) 20 MG capsule TAKE 1 CAPSULE (20 MG TOTAL) BY MOUTH DAILY. 90 capsule 1    pravastatin (PRAVACHOL) 10 MG tablet TAKE 1 TABLET EVERY DAY 90 tablet 3    [START ON 06/09/2023] predniSONE (DELTASONE) 1 MG tablet Take 1 tablet (1 mg total) by mouth daily. 30 tablet 0    predniSONE (DELTASONE) 5 MG tablet Take 3 tablets (15 mg total) by mouth daily for 30 days, THEN 2.5 tablets (12.5 mg total) daily for 30 days, THEN 2 tablets (10 mg total) daily for 30 days, THEN 1.5  tablets (7.5 mg total) daily for 30 days, THEN 1 tablet (5 mg total) daily for 30 days, THEN 0.5 tablets (2.5 mg total) daily. 315 tablet 0    sertraline (ZOLOFT) 100 MG tablet TAKE 1 TABLET (100 MG TOTAL) BY MOUTH DAILY. 90 tablet 1    tamsulosin (FLOMAX) 0.4 mg capsule Take 1 capsule (0.4 mg total) by mouth daily. 90 capsule 3    traZODone (DESYREL) 150 MG tablet Take 1 tablet (150 mg total) by mouth nightly. 90 tablet 3    trospium (SANCTURA) 20 mg tablet Take 1 tablet (20 mg total) by mouth nightly. 90 tablet 3    TRUEPLUS LANCETS 33 gauge Misc       empagliflozin (JARDIANCE) 10 mg tablet Take 1 tablet (10 mg total) by mouth daily. (Patient not taking: Reported on 02/27/2023) 90 tablet 3     No current facility-administered medications for this visit.         Family History    Family History   Problem Relation Age of Onset    Diabetes Mother     Stroke Mother     Cataracts Mother     Hypertension Mother     Alcohol abuse Father     Cancer Father     Diabetes Father     Cataracts Father     Hypertension Father     Melanoma Father     Hypertension Maternal Grandmother     Cancer Maternal Grandmother     Hypertension Maternal Grandfather     Hypertension Paternal Grandmother     Cataracts Paternal Grandfather     Hypertension Paternal Grandfather     COPD Brother         moker and alcholic    Early death Brother     COPD Sister         was a smoker    Diabetes Sister     COPD Brother         moker    Diabetes Brother     Diabetes Sister     Kidney disease Maternal Aunt         died from kidney diease due to diabeties    No Known Problems Maternal Uncle     No Known Problems Paternal Aunt     No Known Problems Paternal Uncle     No Known Problems Other     Blindness Neg Hx     Thyroid disease Neg Hx     Macular degeneration Neg Hx     Glaucoma Neg Hx     Basal cell carcinoma Neg Hx     Squamous cell carcinoma Neg Hx     Anesthesia problems Neg Hx     Broken bones Neg Hx     Clotting disorder Neg Hx     Collagen disease Neg Hx     Dislocations Neg Hx     Fibromyalgia Neg Hx     Gout Neg Hx     Hemophilia Neg Hx     Osteoporosis Neg Hx     Rheumatologic disease Neg Hx     Scoliosis Neg Hx     Severe sprains Neg Hx     Sickle cell anemia Neg Hx     Spinal Compression Fracture Neg Hx        Social History    Social History     Socioeconomic History    Marital status: Married     Spouse name: Stanton Kidney   Occupational  History    Occupation: Insurance claims handler work   Tobacco Use    Smoking status: Never Smokeless tobacco: Never    Tobacco comments:     never will   Vaping Use    Vaping status: Never Used   Substance and Sexual Activity    Alcohol use: No     Alcohol/week: 0.0 standard drinks of alcohol    Drug use: Not Currently    Sexual activity: Yes     Partners: Female   Other Topics Concern    Do you use sunscreen? No    Tanning bed use? No    Are you easily burned? Yes    Excessive sun exposure? Yes    Blistering sunburns? Yes    Exercise Yes    Living Situation No     Social Determinants of Health     Financial Resource Strain: Low Risk  (01/10/2023)    Overall Financial Resource Strain (CARDIA)     Difficulty of Paying Living Expenses: Not hard at all   Food Insecurity: No Food Insecurity (01/10/2023)    Hunger Vital Sign     Worried About Running Out of Food in the Last Year: Never true     Ran Out of Food in the Last Year: Never true   Transportation Needs: No Transportation Needs (01/10/2023)    PRAPARE - Therapist, art (Medical): No     Lack of Transportation (Non-Medical): No       Review of Systems  Except as noted in the history of present of illness, all other systems are negative.      Physical Exam    Constitutional: Elderly male, in no acute distress  Blood pressure 128/78, pulse 98, temperature 36.4 ??C (97.5 ??F), temperature source Temporal, resp. rate 18, height 165.1 cm (5' 5), weight 86.7 kg (191 lb 3.2 oz), SpO2 97%.   Cardiovascular: RRR. No murmurs. No rubs or gallops. PMI not displaced. No thrills, lifts, or heaves. No carotid bruits. No edema.   Respiratory: Normal respiratory effort with symmetrical lung expansion. Clear to auscultation.  Skin: Skin is warm, moist, soft, and elastic. No discoloration or lesions   Neurologic: Alert and oriented X 3.   Psychiatric: Mood and affect appropriate.  Hematologic/Lymphatic/Immunologic: No signs of bleeding or excessive bruising.

## 2023-02-27 NOTE — Unmapped (Signed)
You may stop your pravastatin. Continue your praluent injectable cholesterol medication.

## 2023-02-28 ENCOUNTER — Emergency Department
Admit: 2023-02-28 | Discharge: 2023-03-01 | Disposition: A | Discharge status: Home / Self Care | Payer: MEDICARE | Attending: Family Medicine

## 2023-02-28 ENCOUNTER — Ambulatory Visit
Admit: 2023-02-28 | Discharge: 2023-03-01 | Disposition: A | Discharge status: Home / Self Care | Payer: MEDICARE | Attending: Family Medicine

## 2023-02-28 DIAGNOSIS — M25552 Pain in left hip: Principal | ICD-10-CM

## 2023-02-28 DIAGNOSIS — R1032 Left lower quadrant pain: Principal | ICD-10-CM

## 2023-02-28 DIAGNOSIS — M25472 Effusion, left ankle: Principal | ICD-10-CM

## 2023-02-28 DIAGNOSIS — M6580 Other synovitis and tenosynovitis, unspecified site: Principal | ICD-10-CM

## 2023-02-28 LAB — CBC W/ AUTO DIFF
BASOPHILS ABSOLUTE COUNT: 0 10*9/L (ref 0.0–0.1)
BASOPHILS RELATIVE PERCENT: 0.3 %
EOSINOPHILS ABSOLUTE COUNT: 0.1 10*9/L (ref 0.0–0.5)
EOSINOPHILS RELATIVE PERCENT: 1.7 %
HEMATOCRIT: 36.9 % — ABNORMAL LOW (ref 39.0–48.0)
HEMOGLOBIN: 11.9 g/dL — ABNORMAL LOW (ref 12.9–16.5)
LYMPHOCYTES ABSOLUTE COUNT: 1.4 10*9/L (ref 1.1–3.6)
LYMPHOCYTES RELATIVE PERCENT: 23.6 %
MEAN CORPUSCULAR HEMOGLOBIN CONC: 32.4 g/dL (ref 32.0–36.0)
MEAN CORPUSCULAR HEMOGLOBIN: 28.7 pg (ref 25.9–32.4)
MEAN CORPUSCULAR VOLUME: 88.7 fL (ref 77.6–95.7)
MEAN PLATELET VOLUME: 7.3 fL (ref 6.8–10.7)
MONOCYTES ABSOLUTE COUNT: 0.6 10*9/L (ref 0.3–0.8)
MONOCYTES RELATIVE PERCENT: 9.2 %
NEUTROPHILS ABSOLUTE COUNT: 3.9 10*9/L (ref 1.8–7.8)
NEUTROPHILS RELATIVE PERCENT: 65.2 %
NUCLEATED RED BLOOD CELLS: 0 /100{WBCs} (ref <=4)
PLATELET COUNT: 139 10*9/L — ABNORMAL LOW (ref 150–450)
RED BLOOD CELL COUNT: 4.16 10*12/L — ABNORMAL LOW (ref 4.26–5.60)
RED CELL DISTRIBUTION WIDTH: 14.6 % (ref 12.2–15.2)
WBC ADJUSTED: 6 10*9/L (ref 3.6–11.2)

## 2023-02-28 LAB — URINALYSIS WITH MICROSCOPY
BACTERIA: NONE SEEN /HPF
BILIRUBIN UA: NEGATIVE
BLOOD UA: NEGATIVE
GLUCOSE UA: 500 — AB
KETONES UA: NEGATIVE
LEUKOCYTE ESTERASE UA: NEGATIVE
NITRITE UA: NEGATIVE
PH UA: 5.5 (ref 5.0–7.0)
PROTEIN UA: NEGATIVE
RBC UA: 0 /HPF (ref 0–3)
SPECIFIC GRAVITY UA: 1.02 (ref 1.005–1.030)
SQUAMOUS EPITHELIAL: 1 /HPF (ref 0–5)
UROBILINOGEN UA: 0.2
WBC UA: 1 /HPF (ref 0–3)

## 2023-02-28 LAB — COMPREHENSIVE METABOLIC PANEL
ALBUMIN: 4.1 g/dL (ref 3.4–5.0)
ALKALINE PHOSPHATASE: 59 U/L (ref 38–126)
ALT (SGPT): 14 U/L (ref <50)
ANION GAP: 10 mmol/L (ref 7–15)
AST (SGOT): 20 U/L (ref 19–55)
BILIRUBIN TOTAL: 0.5 mg/dL (ref 0.1–1.2)
BLOOD UREA NITROGEN: 11 mg/dL (ref 7–21)
BUN / CREAT RATIO: 12
CALCIUM: 8.3 mg/dL — ABNORMAL LOW (ref 8.5–10.2)
CHLORIDE: 99 mmol/L (ref 98–107)
CO2: 25 mmol/L (ref 22.0–32.0)
CREATININE: 0.9 mg/dL (ref 0.70–1.30)
EGFR CKD-EPI (2021) MALE: 90 mL/min/{1.73_m2} (ref >=60)
GLUCOSE RANDOM: 353 mg/dL — ABNORMAL HIGH (ref 74–106)
POTASSIUM: 4 mmol/L (ref 3.5–5.0)
PROTEIN TOTAL: 7 g/dL (ref 6.5–8.3)
SODIUM: 134 mmol/L — ABNORMAL LOW (ref 135–145)

## 2023-02-28 LAB — D-DIMER, QUANTITATIVE: D-DIMER QUANTITATIVE (CW,ML,HL,HS,CH,JS,JC,RX,RH): 728 ng{FEU}/mL — ABNORMAL HIGH (ref <=500)

## 2023-02-28 LAB — URIC ACID: URIC ACID: 3.6 mg/dL — ABNORMAL LOW (ref 4.0–9.0)

## 2023-02-28 MED ADMIN — iohexol (OMNIPAQUE) 350 mg iodine/mL solution 100 mL: 100 mL | INTRAVENOUS | @ 22:00:00 | Stop: 2023-02-28

## 2023-02-28 MED ADMIN — oxyCODONE (ROXICODONE) immediate release tablet 5 mg: 5 mg | ORAL | @ 19:00:00 | Stop: 2023-02-28

## 2023-02-28 NOTE — Unmapped (Signed)
[facility]  Emergency Department Provider Note     ED Clinical Impression      Final diagnoses:   Left groin pain (Primary)          Impression and Progress Notes      Initial Impression, Differential Diagnosis and Plan of Care    Christopher [name] is a 72 y.o. male with PMHx of CHF, BCC, T2DM, CAD, polymyalgia rheumatica, TIA, and MI who presents to the ED for evaluation of left sided groin pain, which began 2 weeks ago. Triage VS WNL. DDx includes, but isn't not limited to: DVT, nerve compression, bursitis, hernia, arthritis, strain, sprain, fracture, UTI vs less likely epididymitis vs orchitis. Plan for CBC, CMP, D-dimer. Will give 5 mg oxycodone.     CBC and CMP reassuring. D-dimer elevated, PVL obtained which was negative for DVT.     Patient's care transferred to PA Pulliam at shift change. CT A/P and UA pending.    Medication(s) Prescribed    New Prescriptions    No medications on file        Additional Progress Notes    ED Course as of 02/28/23 1625   Wed Feb 28, 2023   1417 Platelet(!): 139   1417 HGB(!): 11.9  Previously 12.3   1431 D-Dimer(!): 728   1454 Calcium(!): 8.3   1454 Glucose(!): 353   1614 PVL Venous Duplex Lower Extremity Left  IMPRESSION:  No deep vein thrombosis in the left lower extremity.         Portions of this record have been created using Scientist, clinical (histocompatibility and immunogenetics). Dictation errors have been sought, but may not have been identified and corrected.    See chart documentation for details.    ____________________________________________         History        Reason for Visit  Hip Pain Non-Traumatic and Leg Swelling      HPI   Christopher [name] is a 72 y.o. male with PMHx of CHF, BCC, T2DM, CAD, polymyalgia rheumatica, TIA, and MI who presents to the ED for evaluation of left sided groin pain, which began 2 weeks ago.    Pt reports that he has had 2 weeks of left-sided groin pain. The pain will radiate to his testicle at times. Now, he states the pain is radiating down his left leg and into his foot. His wife notes his lower leg looks swollen today. He reports the pain is 9/10 in intensity. He denies any falls or trauma. Pt saw Orthopedics on 10/22 with plan for a steroid injection of the left hip. Pt denies fever, dysuria, hematuria, urgency, frequency, numbness, tingling and vomiting. LNBM was this morning. Pt is not on blood thinners. He has been taking his chronic hydrocodone for his pain without improvement.     Outside Historian(s)  Pt's wife present at bedside.     External Records Reviewed  See above (02/13/23 Ortho note)        Past Medical History:   Diagnosis Date    Acute kidney injury (CMS-HCC) 01/12/2018    Anemia     IRON DEFICIENT     Anxiety dont know    Arthritis     Basal cell carcinoma     Cataract     CHF (congestive heart failure) (CMS-HCC)     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    Corneal abrasion 1975    steel in right eye with abrasion and removal  Cubital tunnel syndrome on left 01/16/2019    Depression 2016    Diabetes mellitus (CMS-HCC)     GERD (gastroesophageal reflux disease) 1982    Hypertension     Meningitis 12/25/2015    Mixed hyperlipidemia     Myocardial infarction (CMS-HCC) 1986    Obesity 1986    first back surgery    Peptic ulceration        Patient Active Problem List   Diagnosis    Coronary artery disease involving native coronary artery of native heart without angina pectoris    Hypertension associated with type 2 diabetes mellitus (CMS-HCC)    Mixed hyperlipidemia    Moderate episode of recurrent major depressive disorder (CMS-HCC)    Primary insomnia    Back pain    Type 2 diabetes mellitus with stage 2 chronic kidney disease, without long-term current use of insulin (CMS-HCC)    Gastroesophageal reflux disease    Tinea cruris    Absence of bladder continence    Herpes labialis    Controlled substance agreement signed    Os peroneum syndrome of right foot    Cavovarus deformity of foot, acquired, left    Skin lesion Vitamin D deficiency    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)    Urinary urgency    Opioid dependence with current use (CMS-HCC)    Metatarsalgia of left foot    History of arthroplasty of left shoulder    Cubital tunnel syndrome on left    RLS (restless legs syndrome)    TIA (transient ischemic attack)    Chronic gout of right knee    Accelerating angina (CMS-HCC)    Muscle stiffness    Polymyalgia rheumatica (CMS-HCC)    Left hip pain       Past Surgical History:   Procedure Laterality Date    BACK SURGERY      CARDIAC CATHETERIZATION      STENTS PLACEMENT     CARDIAC SURGERY  2005    stents    CORONARY STENT PLACEMENT      FOREIGN BODY REMOVAL Right about 20 years ago    HERNIA REPAIR      RIGHT INGUNIAL    JOINT REPLACEMENT Left     TOTAL SHOULDER    PR ARM/ELBOW TENDON LENGTHEN,SINGLE,EA Left 02/17/2019    Procedure: TENDON LENGTHENING UPPER ARM/ELBOW SNGL EA;  Surgeon: Daisy Lazar, MD;  Location: ASC OR [facility];  Service: Orthopedics    PR CABG, ARTERIAL, SINGLE Midline 03/20/2022    Procedure: CORONARY ARTERY BYPASS, USING ARTERIAL GRAFT(S); SINGLE ARTERIAL GRAFT;  Surgeon: Arlester Marker, MD;  Location: MAIN OR [facility];  Service: Cardiac Surgery    PR CABG, ARTERY-VEIN, SINGLE Midline 03/20/2022    Procedure: CORONARY ARTERY BYPASS, USING VENOUS GRAFT(S) AND ARTERIAL GRAFT(S); SINGLE VEIN GRAFT;  Surgeon: Arlester Marker, MD;  Location: MAIN OR [facility];  Service: Cardiac Surgery    PR CATH PLACE/CORON ANGIO, IMG SUPER/INTERP,W LEFT HEART VENTRICULOGRAPHY N/A 02/01/2022    Procedure: Left Heart Catheterization;  Surgeon: Jacquelyne Balint, MD;  Location: [facility] CATH;  Service: Cardiology    PR ENDOSCOPY W/VIDEO-ASST VEIN HARVEST,CABG Right 03/20/2022    Procedure: ENDOSCOPY, SURGICAL, INCLUDING VIDEO-ASSISTED HARVEST OF VEIN(S) FOR CORONARY ARTERY BYPASS PROCEDURE;  Surgeon: Arlester Marker, MD;  Location: MAIN OR ;  Service: Cardiac Surgery    PR OSTEOTOMY 1ST METATARSAL,BASE/SHAFT Left 07/20/2017    Procedure: OSTEOTOMY, W/WO LENGTHENING, SHORTENING OR ANGULAR CORRECTION, METATARSAL; FIRST METATARSAL;  Surgeon: Valda Favia, MD;  Location: ASC OR [facility];  Service: Ortho Foot & Ankle    PR OSTEOTOMY HEEL BONE Left 07/20/2017    Procedure: OSTEOTOMY; Loistine Chance W/WO INT FIXA;  Surgeon: Valda Favia, MD;  Location: ASC OR [facility];  Service: Ortho Foot & Ankle    PR OSTEOTOMY METATARSAL (NOT 1ST) Left 06/06/2018    Procedure: OSTEOTOMY, W/WO LENGTHENING, SHORTENING OR ANGULAR CORRECTION, METATARSAL; OTHER THAN FIRST METATARSAL, EA;  Surgeon: Valda Favia, MD;  Location: ASC OR [facility];  Service: Orthopedics    PR PART REMV PHALANX OF TOE Left 06/06/2018    Procedure: PART EXC BONE PHALANX TOE;  Surgeon: Valda Favia, MD;  Location: ASC OR [facility];  Service: Orthopedics    PR RECONSTR TOTAL SHOULDER IMPLANT Left 11/12/2018    Procedure: R28   ARTHROPLASTY, GLENOHUMERAL JOINT; TOTAL SHOULDER(GLENOID & PROXIMAL HUMERAL REPLACEMENT(EG, TOTAL SHOULDER);  Surgeon: Nickola Major, MD;  Location: [facility] OR [facility];  Service: Orthopedics    PR REMOVAL IMPLANT DEEP Left 11/12/2018    Procedure: REMOVE IMPLANT; DEEP SHOULDER;  Surgeon: Nickola Major, MD;  Location[facility] OR [facility];  Service: Orthopedics    PR REPAIR BICEPS LONG TENDON Left 11/12/2018    Procedure: TENODESIS LONG TENDON BICEPS;  Surgeon: Nickola Major, MD;  Location: [facility] OR [facility];  Service: Orthopedics    PR REPAIR PERONEAL TENDONS,FIB OSTEOTMY Left 07/20/2017    Procedure: REPR DISLOC PERONEAL TENDONS; W/FIB OSTEOTOMY;  Surgeon: Valda Favia, MD;  Location: ASC OR [facility];  Service: Ortho Foot & Ankle    PR REVISE MEDIAN N/CARPAL TUNNEL SURG Left 02/17/2019    Procedure: R22 NEUROPLASTY AND/OR TRANSPOSITION; MEDIAN NERVE AT CARPAL TUNNEL;  Surgeon: Theodora Blow Jacqlyn Krauss, MD;  Location: ASC OR [facility];  Service: Orthopedics    PR REVISE ULNAR NERVE AT ELBOW Left 02/17/2019    Procedure: NEUROPLASTY AND/OR TRANSPOSITION; ULNAR NERVE AT ELBOW;  Surgeon: Daisy Lazar, MD;  Location: ASC OR [facility];  Service: Orthopedics    SHOULDER SURGERY Bilateral     L TSA, R ROTATOR CUFF      SKIN BIOPSY      SPINE SURGERY  1986/1995/2000/2002    LUMBAR FUSION    stent           Current Facility-Administered Medications:     iohexol (OMNIPAQUE) 350 mg iodine/mL solution 100 mL, 100 mL, Intravenous, Once in imaging, Macarthur Critchley, PA    Current Outpatient Medications:     pravastatin (PRAVACHOL) 10 MG tablet, , Disp: , Rfl:     alirocumab (PRALUENT PEN) 75 mg/mL PnIj, Inject the contents of 1 pen (75 mg) under the skin every fourteen (14) days., Disp: 2 mL, Rfl: 11    amlodipine (NORVASC) 10 MG tablet, Take 1 tablet (10 mg total) by mouth daily. For high blood pressure, Disp: 90 tablet, Rfl: 3    aspirin (ECOTRIN) 81 MG tablet, Take 1 tablet (81 mg total) by mouth daily., Disp: 30 tablet, Rfl: 11    blood glucose control, low (TRUE METRIX LEVEL 1) Soln, For glucometer machine, Disp: 1 each, Rfl: 2    blood sugar diagnostic (TRUE METRIX GLUCOSE TEST STRIP) Strp, CHECK BLOOD SUGAR AS DIRECTED ONCE A DAY AND FOR SYMPTOMS OF HIGH OR LOW BLOOD SUGAR., Disp: 100 strip, Rfl: 2    blood-glucose meter kit, Use as instructed., Disp: 1 each, Rfl: 0    clotrimazole (LOTRIMIN) 1 % cream, APPLY TOPICALLY TWO (2) TIMES  A DAY., Disp: 45 g, Rfl: 11    diclofenac sodium (VOLTAREN ARTHRITIS PAIN) 1 % gel, USE TRANSDERMAL 4 TIMES DAILY AS NEEDED, Disp: , Rfl:     docusate sodium (COLACE) 100 MG capsule, Take 1 capsule (100 mg total) by mouth two (2) times a day., Disp: 60 capsule, Rfl: 0    empagliflozin (JARDIANCE) 10 mg tablet, Take 1 tablet (10 mg total) by mouth daily. (Patient not taking: Reported on 02/27/2023), Disp: 90 tablet, Rfl: 3    fluticasone propionate (FLONASE) 50 mcg/actuation nasal spray, 2 sprays into each nostril daily., Disp: 48 mL, Rfl: 3    gabapentin (NEURONTIN) 300 MG capsule, Take 2 capsules (600 mg total) by mouth two (2) times a day., Disp: 180 capsule, Rfl: 3    lancets Misc, Disp. lancets #100 or amount allowed, Testing Qday. Dx: E11.9 (Diabetes- controlled), Disp: 100 each, Rfl: 3    metFORMIN (GLUCOPHAGE) 1000 MG tablet, TAKE 1 TABLET BY MOUTH IN THE MORNING AND 1 TABLET IN THE EVENING WITH MEALS., Disp: 180 tablet, Rfl: 1    metoPROLOL tartrate (LOPRESSOR) 25 MG tablet, Take 1 tablet (25 mg total) by mouth two (2) times a day., Disp: 180 tablet, Rfl: 3    omeprazole (PRILOSEC) 20 MG capsule, TAKE 1 CAPSULE (20 MG TOTAL) BY MOUTH DAILY., Disp: 90 capsule, Rfl: 1    [START ON 06/09/2023] predniSONE (DELTASONE) 1 MG tablet, Take 1 tablet (1 mg total) by mouth daily., Disp: 30 tablet, Rfl: 0    predniSONE (DELTASONE) 5 MG tablet, Take 3 tablets (15 mg total) by mouth daily for 30 days, THEN 2.5 tablets (12.5 mg total) daily for 30 days, THEN 2 tablets (10 mg total) daily for 30 days, THEN 1.5 tablets (7.5 mg total) daily for 30 days, THEN 1 tablet (5 mg total) daily for 30 days, THEN 0.5 tablets (2.5 mg total) daily., Disp: 315 tablet, Rfl: 0    sertraline (ZOLOFT) 100 MG tablet, TAKE 1 TABLET (100 MG TOTAL) BY MOUTH DAILY., Disp: 90 tablet, Rfl: 1    tamsulosin (FLOMAX) 0.4 mg capsule, Take 1 capsule (0.4 mg total) by mouth daily., Disp: 90 capsule, Rfl: 3    traZODone (DESYREL) 150 MG tablet, Take 1 tablet (150 mg total) by mouth nightly., Disp: 90 tablet, Rfl: 3    trospium (SANCTURA) 20 mg tablet, Take 1 tablet (20 mg total) by mouth nightly., Disp: 90 tablet, Rfl: 3    TRUEPLUS LANCETS 33 gauge Misc, , Disp: , Rfl:     Allergies  Morphine, Atorvastatin, Atorvastatin calcium, Baclofen, Ibuprofen, Pneumococcal 23-valent polysaccharide vaccine, and Zetia [ezetimibe]    Family History   Problem Relation Age of Onset    Diabetes Mother     Stroke Mother     Cataracts Mother     Hypertension Mother     Alcohol abuse Father     Cancer Father     Diabetes Father     Cataracts Father     Hypertension Father     Melanoma Father     Hypertension Maternal Grandmother     Cancer Maternal Grandmother     Hypertension Maternal Grandfather     Hypertension Paternal Grandmother     Cataracts Paternal Grandfather     Hypertension Paternal Grandfather     COPD Brother         moker and alcholic    Early death Brother     COPD Sister         was a  smoker    Diabetes Sister     COPD Brother         moker    Diabetes Brother     Diabetes Sister     Kidney disease Maternal Aunt         died from kidney diease due to diabeties    No Known Problems Maternal Uncle     No Known Problems Paternal Aunt     No Known Problems Paternal Uncle     No Known Problems Other     Blindness Neg Hx     Thyroid disease Neg Hx     Macular degeneration Neg Hx     Glaucoma Neg Hx     Basal cell carcinoma Neg Hx     Squamous cell carcinoma Neg Hx     Anesthesia problems Neg Hx     Broken bones Neg Hx     Clotting disorder Neg Hx     Collagen disease Neg Hx     Dislocations Neg Hx     Fibromyalgia Neg Hx     Gout Neg Hx     Hemophilia Neg Hx     Osteoporosis Neg Hx     Rheumatologic disease Neg Hx     Scoliosis Neg Hx     Severe sprains Neg Hx     Sickle cell anemia Neg Hx     Spinal Compression Fracture Neg Hx        Social History  Social History     Tobacco Use    Smoking status: Never     Passive exposure: Never    Smokeless tobacco: Never    Tobacco comments:     never will   Vaping Use    Vaping status: Never Used   Substance Use Topics    Alcohol use: No     Alcohol/week: 0.0 standard drinks of alcohol    Drug use: Not Currently          Review of Systems   Review of Systems   Constitutional:  Negative for chills and fever.   Respiratory:  Negative for shortness of breath.    Cardiovascular:  Positive for leg swelling. Negative for chest pain.   Gastrointestinal:  Negative for abdominal pain, nausea and vomiting.   Genitourinary:  Negative for dysuria, frequency, hematuria and urgency.   Musculoskeletal:  Negative for back pain.        + left groin and leg   Skin:  Negative for rash.   Neurological:  Negative for dizziness, tingling, weakness and headaches.   All other systems reviewed and are negative.        Physical Exam       ED Triage Vitals [02/28/23 1227]   Enc Vitals Group      BP 113/68      Heart Rate 78      SpO2 Pulse       Resp 18      Temp 37 ??C (98.6 ??F)      Temp Source Temporal      SpO2 96 %      Weight 86.6 kg (191 lb)      Height 1.651 m (5' 5)       Physical Exam  Vitals and nursing note reviewed.   Constitutional:       General: He is not in acute distress.     Appearance: Normal appearance. He is not ill-appearing or toxic-appearing.      Comments: Laying  comfortably in bed, nontoxic    HENT:      Head: Normocephalic and atraumatic.      Mouth/Throat:      Mouth: Mucous membranes are moist.   Eyes:      Conjunctiva/sclera: Conjunctivae normal.   Cardiovascular:      Rate and Rhythm: Normal rate and regular rhythm.      Pulses: Normal pulses.      Heart sounds: Normal heart sounds. No murmur heard.  Pulmonary:      Effort: Pulmonary effort is normal. No respiratory distress.      Breath sounds: Normal breath sounds. No wheezing, rhonchi or rales.   Abdominal:      General: Bowel sounds are normal. There is no distension.      Palpations: Abdomen is soft.      Tenderness: There is no abdominal tenderness. There is no right CVA tenderness, left CVA tenderness, guarding or rebound.   Genitourinary:     Comments: No obvious hernia appreciated to left groin. No overlying skin changes.  No edema or tenderness appreciated to testes.  Musculoskeletal:         General: Normal range of motion.      Cervical back: Normal range of motion.      Left upper leg: Edema and tenderness present.      Right lower leg: No edema.      Left lower leg: Tenderness present. Edema present.      Comments: Diffuse tenderness to left groin, medial leg, and lower leg. Mild edema in lower leg appreciated. Discomfort with hip flexion.   Skin:     General: Skin is warm.      Capillary Refill: Capillary refill takes less than 2 seconds.      Findings: No rash.   Neurological:      General: No focal deficit present.      Mental Status: He is alert and oriented to person, place, and time.      Comments: Sensation intact to BLE.   Psychiatric:         Mood and Affect: Mood normal.         Behavior: Behavior normal.        Radiology     PVL Venous Duplex Lower Extremity Left   Final Result   No deep vein thrombosis in the left lower extremity.         CT Abdomen Pelvis W Contrast    (Results Pending)        Procedures     None    Medical Decision Making and Critical Care      Independent Interpretation of Studies    I have independently interpreted the following studies:  Ultrasound(s): negative for DVT; see ED course       The chart was initially created by Clarene Essex using scribe services on February 28, 2023 1:50 PM, on behalf of Willeen Cass, [place].    February 28, 2023 3:28 PM. Documentation assistance provided by the scribe. I was present during the time the encounter was recorded. The information recorded by the scribe was done at my direction and has been reviewed and validated by me.        Willeen Cass [place], [place]  02/28/23 [phone]

## 2023-02-28 NOTE — Unmapped (Signed)
Ongoing left hip and groin pain x 4-6 weeks. Patient mentioned this to PCP, xrays were done on 9/18 and he was referred to Ortho which he saw on 10/22. He is scheduled to have hip injections on 03/06/23. He states that as time has went on, the pain is now radiating into the left groin, left testicle pain. He also feels like he has been having swelling that has increased by the day from the left hip area and below. Also states that it is becoming more difficult to lift the left leg.  Denies recent injury or trauma.     CSM intact in BLE. No distress. Brought to triage in wheelchair secondary to difficulty with ambulation. Vitals all WNL.

## 2023-03-01 NOTE — Unmapped (Cosign Needed)
ED Progress Note    4:27 PM  Assumed care of Mr. Harvel Quale from [place], [place].    Jaymere Vanblaricom Legacy Raffetto is a 72 y.o. male with PMHx of CHF, BCC, T2DM, CAD, polymyalgia rheumatica, TIA, and MI who presents to the ED for evaluation of left sided groin pain, which began 2 weeks ago.  He saw orthopedics on 10/22 with plan for steroid injection of the left hip.  Patient does not believe this is musculoskeletal pain.  Pain again radiates to his groin and down his leg.    Workup so far is included CBC, CMP, urinalysis and D-dimer.  This has been overall unremarkable except for glucose of 353 and a D-dimer of 728.  PVL was performed which was negative for DVT.    Patient has contrasted CT of the abdomen pelvis pending will add musculoskeletal windows of the pelvis as well.    On exam he is well-appearing in no acute distress, there is no inguinal mass or tenderness.  He does have pain with flexion of the hip.  Additionally he has tenderness and swelling over his left ankle.    Contrast CT abdomen pelvis negative for acute abnormality, musculoskeletal windows shows:     Mineralization in the left iliopsoas tendon was not clearly present on the left hip with pelvis radiographic examination dated 01/10/2023. This finding may reflect an age-indeterminate (late subacute or chronic) partial tear of the left iliopsoas tendon (differential metabolic calcium crystal deposition as seen in the hamstring tendons). Correlate with left hip flexion. Consider noncontrast MRI to further evaluate if this information will affect management.     Is consistent with findings and I suspect this is the culprit.    Patient with swelling and tenderness to his left ankle.  Uric acid is not elevated and plain film is negative for bony abnormality but does show ankle joint effusion.  Patient is provided with an Ace wrap.  We have discussed using his walker and limiting his weightbearing.  Note sent to his orthopedist and PCP regarding today's ED visit.  Follow-up and return precautions reviewed.

## 2023-03-06 ENCOUNTER — Ambulatory Visit: Admit: 2023-03-06 | Discharge: 2023-03-07 | Payer: MEDICARE

## 2023-03-06 DIAGNOSIS — M1612 Unilateral primary osteoarthritis, left hip: Principal | ICD-10-CM

## 2023-03-06 MED ADMIN — triamcinolone acetonide (KENALOG) injection 20 mg: 20 mg | INTRA_ARTICULAR | @ 19:00:00 | Stop: 2023-03-06

## 2023-03-06 NOTE — Unmapped (Signed)
Home Exercise Program  Hip Osteoarthritis       Goals of Home Exercise Program:  Increase Range of Motion of Hip  Strengthen the Muscles that Support the Hip  Stretch the Muscles that Support the Hip  Improve or Maintain Cardiovascular Fitness  Decrease Inflammation and Pain    Please note:  Exercises are not always comfortable and may cause soreness and discomfort. However, they should not cause sharp pain. Please consult the athletic trainer associated with your provider if you have any questions or exercises that are causing a significant amount of pain. However, please also know that all rehab needs slight discomfort in order to provide the effect necessary.      Warm Up:    In order to warm up the tissues and make them more pliable for exercises, try heating the area for 10-15 minutes with a heating pad, warm wet towel, or a hot shower.      Goal 1: Increase Range of Motion of Hip  Heel Slides:       With a band or towel, pull your involved foot towards your buttocks bending your knee as you go. The work should be done with your arms pulls your foot, and your leg/knee should be relaxed.        Repeat 15 Times   Complete 3 Set   Perform 3x/Day                    Lying down, place your buttocks as close to the wall as possible. Beginning with your legs straight in the air, slowly bend your involved side down as close to your buttocks as possible and slowly back up the wall. Using a towel under your foot or socks makes this easier.        Repeat 15 Times   Complete 3 Set   Perform 3x/Day                   Goal 2: Strengthen the Muscles that Support the Hip    Isometric Holds (Contractions without Movement):           Place a ball or rolled towel between your knees. Squeeze your knees together and hold.        Repeat   10 Times   Hold 3 Secs   Complete 3 Sets   Perform 3x/ Day          Marching:       While standing near a firm object for balance, bend your knee and bring it as high as you can and slowly lower it.      Repeat 10 Times   Hold 3 Secs   Complete 3 Sets   Perform 3x/Day        Clamshells:     Perform this exercise while lying on your uninvolved side. Keeping your feet together, lift your involved hip towards the ceiling. Be careful not to twist your entire body, but only to rotate the leg towards the ceiling.       Repeat   10 Times   Hold 3 Secs   Complete 3 Sets   Perform 3x/Day          Goal 3: Stretch the Muscles that Support the Hip    Hamstrings:         In a seated position, reach for your involved foot. You should feel this in the back of your leg (hamstrings).  Hold   20 seconds   Complete 3 Sets   Perform 2x/Day            Quadriceps:        Standing, hold your involved foot and gently pull behind you. You should feel this in your thigh (quadriceps).        Hold   20 seconds   Complete 3 Sets   Perform 2x/Day            IT Band:       In a standing position, cross you uninvolved leg in front of your bad leg. Reach towards your uninvolved side. You should feel this down the side of your leg (IT Band).        Hold   20 seconds   Complete 3 Sets   Perform 2x/Day              Goal 4: Improve or Maintain Cardiovascular Fitness    With hip osteoarthritis, it is important to keep moving! However, impact on the hip can be particularly difficult and painful. We recommend using an exercise bike or swimming/running in a pool to take the load off of your body. If you are a runner and must run, we recommend a track that has cushion over a road or concrete surface. Cardiovascular exercise should be at least 20 minutes per day.    Goal 5: Decrease Pain and Inflammation    Icing:    Icing is really important after exercises to decrease your discomfort and increase your likelihood of completing them again in the future. Inflammation in the area is likely and icing the area keeps this minimal. You can ice with ice in a Ziploc bag, a bag of frozen peas (as long as you don???t later eat them), or by purchasing a reusable flexible ice pack. It is important to ice for 15-20 minutes at a time. After icing, do not reapply ice within the hour, as you can cause adverse effects.    Tylenol:    If cleared by your doctor, taking Tylenol (Acetaminophen) is a great way to reduce pain and make exercises more comfortable.

## 2023-03-06 NOTE — Unmapped (Signed)
SPORTS MEDICINE RETURN VISIT    ASSESSMENT AND PLAN      Diagnosis ICD-10-CM Associated Orders   1. Primary localized osteoarthritis of left hip  M16.12 Lg Joint Inj: L hip joint           Pain is likely multifactorial with pain coming from both an intra-articular source as well as the gluteal tendons.  Based upon his examination, it seems as if more pain is likely coming from the joint than the tendons on the side.  We initially discussed physical therapy, but he was originally opposed to it as he states that he is active all the time from sun up to sundown.  I discussed that physical therapy is very specific strengthening exercises.  He became more amenable to a home exercise program which was provided today.  He was also interested in a left hip injection.  R/B/A were discussed including risk of infection as well as more rapid arthritis progression, and he would like to proceed with left hip intra-articular injection.  He needs to work on strengthening gluteus medius and gluteus minimus.  He should return to clinic if no improvement after 6 to 8 weeks of physical therapy.    No follow-ups on file.    Procedure(s):  PROCEDURE NOTE:  Hip injection Left  Consent   After discussing the various treatment options for the condition, It was agreed that a corticosteroid injection would be the next step in treatment. The nature of and the indications for a corticosteroid and / or local anaesthetic injection were reviewed in detail with the patient today. The inherent risks of injection including infection, bleeding, allergic reaction, increased pain, incomplete relief or temporary relief of symptoms, alterations of blood glucose levels requiring careful monitoring and treatment as indicated, tendon, ligament or articular cartilage rupture or degeneration, nerve injury, skin depigmentation, and/or fatty atrophy were discussed.     Procedure :  Left Hip Intraarticular injection.  The risks and benefits of the procedure were explained,verbal consent was given, and a procedural time-out was performed. The hip joint and surrounding structures were visualized with ultrasound and the hip joint and anterior recess were identified  The site for the injection was properly marked and prepped with Chlorhexadine solution.  Using ultrasound guidance, the soft tissues down to the joint were anesthetized with ethyl chloride and 4cc of 1% Lidocaine with a 22 gauge 3.5 inch needle, then the syringe was switched and 20mg  of Kenalog and 4cc of 1% lidocaine were injected into the hip joint using a sterile technique and a 22 gauge 3.5 inch needle. During the injection, there was unrestricted flow and care was taken not to inject corticosteroid into the skin or subcutaneous tissues. There were no complications during the procedure.    NEED FOR SONOGRAPHIC GUIDANCE    Given the complexity of this problem, the anatomic location of this structure, sonographic guidance is recommended to prevent injury to neurovascular structures and confirm accuracy of injection. The accuracy of doing these injections blind is poor and the benefit to the patient by using ultrasound guidance is significant to avoid complications.     Reference:  American Medical Society for Sports Medicine (AMSSM) position statement: interventional musculoskeletal ultrasound in sports medicine.  Morey Hummingbird MM, Adams E, Berkoff D, Concoff AL, [employer]ia [employer] Sports Med. 2014 Oct 20. pii: [id number]. doi: 10.1136/[id number]    Post procedure a sterile band-aide was applied. Post-injection instructions were given regarding post-procedure care, when to follow  up in clinic and what to expect from the procedure. The patient tolerated the injection well and was discharged without complication.    SUBJECTIVE       History of Present Illness: 72 y.o. male who presents for evaluation and treatment of left hip pain.  He is accompanied by his wife who is also my patient and here for visit today.  He was evaluated by Finis Bud, NP who referred him to me for further evaluation.  She reports that pain started a little over 3 months ago without any clear trauma or injury.  He notes that pain is worse when he sits for too long, transitions from sitting to standing, and when having to lift his leg to get in and out of the car.  The pain is predominately in the groin but can wrap around to the lateral and posterior lateral aspect of the hip.  He reports that walking does not result in increased pain.  He has had chronic intermittent pain in the past and he consider this to be an exacerbation of that pain.    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    Corneal abrasion 1975    steel in right eye with abrasion and removal    Cubital tunnel syndrome on left 01/16/2019    Depression 2016    Diabetes mellitus (CMS-HCC)     GERD (gastroesophageal reflux disease) 1982    Hypertension     Meningitis 12/25/2015    Mixed hyperlipidemia     Myocardial infarction (CMS-HCC) 1986    Obesity 1986    first back surgery    Peptic ulceration          OBJECTIVE     Physical Exam:  Vitals:   Wt Readings from Last 3 Encounters:   02/28/23 86.6 kg (191 lb)   02/27/23 86.7 kg (191 lb 3.2 oz)   01/10/23 85.4 kg (188 lb 3.2 oz)     Estimated body mass index is 31.78 kg/m?? as calculated from the following:    Height as of 02/28/23: 165.1 cm (5' 5).    Weight as of 02/28/23: 86.6 kg (191 lb).  Gen: Well-appearing male in no acute distress  MSK:   Hip and Pelvis:   Palpation:              Tenderness: yes, greater trochanter, mild             Masses: Absent bilateral hips/pelvis  ROM:           L   IR::   30   ER:  50   Flexion: 100    Muscle Strength: 5/5 strength of all muscle of the bilateral hip and pelvis  Pain with Resistance Testing:              Rectus Femoris: Positive             Iliopsoas: positive   Hamstring extension: negative   Hamstring flexion: negative   Abduction: negative   Adduction: negative  Special Tests:             Straight  Leg Raise: Negative              FADIR: positive             FABER: positive, anterolateral hip pain             Scour: positive   Log roll: positive    Imaging/other tests:   XR left hip demonstrates mild to moderate femoral acetabular degenerative changes.  Mild joint space narrowing with osteophytosis present.    Lab Results   Component Value Date    A1C 8.7 (H) 10/27/2022       ADMINISTRATIVE     I have personally reviewed and interpreted the images (as available).  Point-of-care ultrasound imaging is on file and stored in a permanent location (if performed).  I have personally reviewed prior records and incorporated relevant information above (as available).    MEDICAL DECISION MAKING (level of service defined by 2/3 elements)     Number/Complexity of Problems Addressed 1 or more chronic illnesses with exacerbation, progression, or side effects of treatment (99204/99214)   Amount/Complexity of Data to be Reviewed/Analyzed Independent interpretation of a test performed by another physician/other qualified health care professional (99204/99214)   Risk of Complications/Morbidity/Mortality of Management Physical Therapy/Occupational Therapy (99203/99213)  Decision for MINOR Surgery (Including Injection) WITH Risk Factors (99204/99214)   Risk: DM2    MODIFIER 25 (Significant, Separately Billable Evaluation and Management)     Documentation to ensure appropriate insurance payment for medically necessary work    Per the [employer] (rev. 04/24/2021) Chapter 13, Section B. Evaluation & Management (E&M) Services, paragraph 5:   ???In general, E&M services on the same date of service as the minor surgical procedure are included in the payment for the procedure???However, a significant and separately identifiable E&M service unrelated to the decision to perform the minor surgical procedure is separately reportable with modifier 25. The E&M service and minor surgical procedure do not require different diagnoses.???    Per the American Medical Association in Reporting CPT Modifier 25 [CPT??Geophysicist/field seismologist (Online). 2023;33(11):1-12.] Page 1, Appropriate Use, paragraph 1:   ???Modifier 25 is used to indicate that a patient's condition required a significant, separately identifiable evaluation and management (E/M) service above and beyond that associated with another procedure or service being reported by the same physician or other qualified health care professional [facility]) on the same date. This service must be above and beyond the other service provided or beyond the usual preoperative and postoperative care associated with the procedure or service that was performed on that same date, and it must be substantiated by documentation in the patient's record that satisfies the relevant criteria for the respective E/M service to be reported.???    Per the American Medical Association in Reporting CPT Modifier 25 [CPT??Geophysicist/field seismologist (Online). 2023;33(11):1-12.] Page 2, Considerations, bullet point 2 (Requires awareness of usual preoperative and postoperative services):   ???Pre- and post-operative services typically associated with a procedure include the following and cannot be reported with a separate E/M services code:   Review of patient's relevant past medical history,  Assessment of the problem area to be treated by surgical or other service,  Formulation and explanation of the clinical diagnosis,  Review and explanation of the procedure to the patient, family, or caregiver,  Discussion of alternative treatments or diagnostic options,  Obtaining informed consent,  Providing postoperative care instructions,  Discussion of any further treatment and follow up after the procedure???    As  the service provider for this encounter, I attest that the patient's condition required a significant, separately identifiable, medical necessary evaluation and management service in addition to the procedure performed on the same date of service. The evaluation and management service was above and beyond the usual preoperative and postoperative care associated with the procedure. The specific elements of the encounter that represent evaluation and management service above and beyond the usual preoperative and postoperative care associated with the procedure include, but are not limited to:  Reviewed the patient's relevant past surgical history, social history and family history  Reviewed the patient's medications  Reviewed the patient's allergies  Independently obtained history of present illness and relevant review of systems  Independently reviewed laboratory, imaging and/or other data  Independently interpreted laboratory, imaging and/or other data  Independently synthesized history, physical examination, laboratory, imaging and/or other data to formulate a management plan including elements separate from the procedure itself and usual postprocedural care       PROCEDURES     Lg Joint Inj: L hip joint on 03/06/2023 1:40 PM  Indications: pain  Details: 22 G needle, ultrasound-guided anterior approach  Laterality: left  Location: hip  Medications: 20 mg triamcinolone acetonide 10 mg/mL    Medical Care Team Attestation: All ProcDoc orders were read back and verbally confirmed with the procedure provider, including but not limited to patient name, medication name, dose, and route, before any actions were taken.         DME     DME ORDER:  Dx:  ,

## 2023-03-14 NOTE — Unmapped (Signed)
I have called Cephus Shelling[facility] they stated they need prescription sent over fax is [phone]. Fax sent on 03/14/23.

## 2023-03-14 NOTE — Unmapped (Signed)
[facility] SUBJECT FOR NEW MSG: Patient Advice Request Patient Name: Christopher [name].  Caller: Self (Patient)  Name of Caller: BenContact Method: Telephone Call: Time- Any Time [phone]   Reason for Call: Romeo Apple is calling in due to being connected to the [facility] to receive his [place], patient states that he has called multiple tines and has not received an answer. He is needing his medication. He is providing the number to the [facility] so a nurse or Dr. Evaristo Bury reach out Phone # to foundation is [phone].  Previously Discussed: no  Appointment Offered: No  If offered accepted, scheduled appt date: N/A

## 2023-03-17 DIAGNOSIS — I152 Hypertension secondary to endocrine disorders: Principal | ICD-10-CM

## 2023-03-17 DIAGNOSIS — E1159 Type 2 diabetes mellitus with other circulatory complications: Principal | ICD-10-CM

## 2023-03-17 MED ORDER — METOPROLOL TARTRATE 25 MG TABLET
ORAL_TABLET | Freq: Two times a day (BID) | ORAL | 3 refills | 0 days
Start: 2023-03-17 — End: ?

## 2023-03-17 MED ORDER — AMLODIPINE 10 MG TABLET
ORAL_TABLET | Freq: Every day | ORAL | 3 refills | 0 days
Start: 2023-03-17 — End: ?

## 2023-03-19 MED ORDER — AMLODIPINE 10 MG TABLET
ORAL_TABLET | Freq: Every day | ORAL | 3 refills | 90 days | Status: CP
Start: 2023-03-19 — End: 2024-03-18

## 2023-03-19 MED ORDER — METOPROLOL TARTRATE 25 MG TABLET
ORAL_TABLET | Freq: Two times a day (BID) | ORAL | 3 refills | 90 days | Status: CP
Start: 2023-03-19 — End: 2024-03-18

## 2023-03-19 NOTE — Unmapped (Signed)
Patient is requesting the following refill  Requested Prescriptions     Pending Prescriptions Disp Refills    amlodipine (NORVASC) 10 MG tablet [Pharmacy Med Name: amLODIPine Besylate Oral Tablet 10 MG] 90 tablet 3     Sig: TAKE 1 TABLET (10 MG TOTAL) BY MOUTH DAILY. FOR HIGH BLOOD PRESSURE    metoPROLOL tartrate (LOPRESSOR) 25 MG tablet [Pharmacy Med Name: Metoprolol Tartrate Oral Tablet 25 MG] 180 tablet 3     Sig: TAKE 1 TABLET (25 MG TOTAL) BY MOUTH TWO (2) TIMES A DAY.       Recent Visits  Date Type Provider Dept   02/27/23 Office Visit [place], Deatra Robinson, [facility] Primary Care [facility]   01/10/23 Office Visit Amada Kingfisher, MD [facility] Family Medicine [place]   12/11/22 Office Visit Teresita Madura, MD [facility] Medicine [place]   11/24/22 Office Visit Amada Kingfisher, MD [facility] Family Medicine [place]   10/27/22 Office Visit Amada Kingfisher, MD [facility] Family Medicine [place]   10/03/22 Office Visit Amada Kingfisher, MD [facility] Family Medicine [place]   08/22/22 Office Visit Lorretta Harp, MD [facility] Primary Care [facility]   07/04/22 Telemedicine Amada Kingfisher, MD [facility] Family Medicine [place]   06/06/22 Office Visit Amada Kingfisher, MD [facility] Family Medicine [place]   05/25/22 Office Visit Lorretta Harp, MD [place] [facility] Primary [facility]   Showing recent visits within past 365 days and meeting all other requirements  Future Appointments  Date Type Provider Dept   09/06/23 Appointment Lorretta Harp, MD [place] [facility] Primary [facility]   Showing future appointments within next 365 days and meeting all other requirements       Labs: Creatinine:   Creatinine (mg/dL)   Date Value   29/56/2130 0.90    Potassium:   Potassium (mmol/L)   Date Value   02/28/2023 4.0     Potassium, Bld (mmol/L)   Date Value   03/21/2022 4.6    Sodium:   Sodium (mmol/L)   Date Value   02/28/2023 134 (L)     Sodium Whole Blood (mmol/L)   Date Value   03/21/2022 137    Vitals:   BP Readings from Last 3 Encounters:   02/28/23 122/84   02/27/23 128/78   01/10/23 144/81    and   Pulse Readings from Last 3 Encounters:   02/28/23 77   02/27/23 98   01/10/23 64

## 2023-03-29 DIAGNOSIS — I259 Chronic ischemic heart disease, unspecified: Principal | ICD-10-CM

## 2023-03-29 DIAGNOSIS — I209 Angina pectoris, unspecified: Principal | ICD-10-CM

## 2023-03-29 DIAGNOSIS — E1169 Type 2 diabetes mellitus with other specified complication: Principal | ICD-10-CM

## 2023-03-29 DIAGNOSIS — I240 Acute coronary thrombosis not resulting in myocardial infarction: Principal | ICD-10-CM

## 2023-03-29 DIAGNOSIS — Z789 Other specified health status: Principal | ICD-10-CM

## 2023-03-29 DIAGNOSIS — E785 Hyperlipidemia, unspecified: Principal | ICD-10-CM

## 2023-03-29 NOTE — Unmapped (Signed)
Christopher [name] requested a refill of their Praluent via IVR/Web. The [facility] has scheduled delivery per the patients request via Same Day Courier to be delivered to their prescription address on 03/30/23.

## 2023-03-30 MED FILL — PRALUENT PEN 75 MG/ML SUBCUTANEOUS PEN INJECTOR: SUBCUTANEOUS | 84 days supply | Qty: 6 | Fill #1

## 2023-04-04 NOTE — Unmapped (Signed)
Form and med list faxed to [facility] [employer] [facility] at [phone]

## 2023-04-04 NOTE — Unmapped (Signed)
Hi! I sent this through adobe for signature - let me know if there's anything I missed or that would need changed! I also still have the physical copy if you need that at all.

## 2023-04-04 NOTE — Unmapped (Signed)
[facility] SUBJECT FOR NEW MSG: Form Processing Patient Name: Christopher [name].   Form Left By: Self (Patient)  How was the Form Received: Dropped off  Type of Form:  Medication assistance form   Completed Form Delivery: Faxed- Number [phone]  Last seen in-person: 01/10/2023  Last telemedicine visit: 07/04/2022  Date needed: ASAP     Provider form should go NF:AOZHY Smith   Scheduled Appt Date (if needed within 7 days): ASAP       Patient states forms must be in faxed in office by the 31st of December. Pt would like a phone call for conformation  once forms have been faxed.

## 2023-04-12 MED ORDER — TROSPIUM 20 MG TABLET
ORAL_TABLET | Freq: Every evening | ORAL | 3 refills | 90.00 days
Start: 2023-04-12 — End: 2024-04-06

## 2023-04-13 MED ORDER — TROSPIUM 20 MG TABLET
ORAL_TABLET | Freq: Every evening | ORAL | 3 refills | 90.00 days | Status: CP
Start: 2023-04-13 — End: 2024-04-07

## 2023-04-13 NOTE — Unmapped (Signed)
Copied from CRM #[id number]. Topic: Access To Clinicians - Req Clinic Call Back  >> Apr 13, 2023  9:42 AM Hulan Amato wrote:  The patient is requesting for the Nurse to contact them in regards to a new script need to be sent to pharmacy and he is [place] patient.  Please contact The patient by Cell Phone      Routine callback turnaround time: 24-48 business hours. Programmer, systems Notified)

## 2023-04-16 MED ORDER — TROSPIUM 20 MG TABLET
ORAL_TABLET | Freq: Every evening | ORAL | 3 refills | 90.00 days | Status: CP
Start: 2023-04-16 — End: 2024-04-10

## 2023-04-16 NOTE — Unmapped (Signed)
Copied from CRM #[id number]. Topic: Access To Clinicians - Req Clinic Call Back  >> Apr 16, 2023 11:12 AM Marylou Mccoy F wrote:  Cleone Slim,    Patient Christopher [name] called requesting a medication refill for the following:    Medication: trospium (SANCTURA)  Dosage: 20 mg tablet  Days left of medication: 0  Pharmacy: [facility]      The expected turnaround time is 5 business days       Check Indicates criteria has been reviewed and confirmed with the patient:    [ ]  Preferred Name   [ ]  DOB and/or MR#  [ ]  Preferred Contact Method  [ ]  Phone Number(s)   [ ]  Preferred Pharmacy   [ ]  MyChart     Thank you,  Trellis Paganini  Urology Services  [phone]

## 2023-05-02 NOTE — Unmapped (Signed)
Copied from CRM #[id number]. Topic: Access To Clinicians - Req Clinic Call Back  >> May 02, 2023  8:45 AM Hulan Amato wrote:  The patient called requesting: to speak with [facility] nurse about his script sent to wrong pharmacy. Thanks        Preferred Contact: Cell Phone Routine callback turnaround time: 24-48 business hours. Programmer, systems Notified)

## 2023-05-03 NOTE — Unmapped (Signed)
The patient was instructed that the medication was sent to [facility], the patient stated yes that is where he wants it sent. I let the patient know that he has to make account and then check out and go on line and pay for the medication before it will be shipped out to him.  Pt stated he understood

## 2023-05-04 NOTE — Unmapped (Signed)
Copied from CRM #[id number]. Topic: Access To Clinicians - Medication Refill  >> May 04, 2023  9:26 AM Saverio Danker wrote:  The caller reports that they have previously requested refill from pharmacy, but have not received authorization yet.     The patient is requesting the following:     Medication(s) for refill: trospium 20 mg  Quantity for 3 month supply    [facility] and [facility]    Please contact The patient by Cell Phone in regards to this request.    Coverage has been verified and updated if needed: no    Routine callback turnaround time: 24-48 business hours. Programmer, systems Notified)

## 2023-05-09 MED ORDER — TROSPIUM 20 MG TABLET
ORAL_TABLET | Freq: Every evening | ORAL | 3 refills | 90.00 days | Status: CP
Start: 2023-05-09 — End: 2024-05-03

## 2023-05-10 NOTE — Unmapped (Signed)
I spoke with the patient and advised him that Christopher [name] did send the RX again and for him to call and check on it and if there are still problems to call back

## 2023-05-16 NOTE — Unmapped (Signed)
Lab Results   Component Value Date    CREATININE 0.90 02/28/2023

## 2023-05-16 NOTE — Unmapped (Addendum)
DIABETES PLAN     Dm goal is <8.0%  Lab Results   Component Value Date    A1C 10.0 (H) 05/17/2023    A1C 8.7 (H) 10/27/2022    A1C 8.7 (H) 10/03/2022   Not well controlled    BP goal is <130/80  BP Readings from Last 3 Encounters:   05/17/23 135/80   02/28/23 122/84   02/27/23 128/78       The 10-yr ASCVD Risk score Denman George DC Jr., et al., 2013) failed to calculate due to the following reason:  The 2013 10-yr ASCVD risk score is only valid if the patient does not have prior/existing clinical ASCVD (myocardial infarction, stroke, CABG, coronary angioplasty, angina or peripheral arterial disease, coronary atherosclerosis, ischemic heart disease, or cerebrovascular disease). The patient has prior/existing ASCVD.  Had Coronary Angioplasty  Has Angina  Has Coronary Atherosclerosis      Current Diabetes Medications: Metformin 1000 mg PO BID and Jardiance 10 mg once daily (just started 2.5 weeks ago)    Today's Recommendations:  Continue Metformin 1000 mg BID and Jardiance 10 mg once daily    Of note, with starting Jardiance discussed history of UTI. No UTI since 2020. Counseled on increased risk in older adults, those with previous infection, and T2DM. Will alert me ASAP if he develops any potential symptoms.     Continue baby aspirin and pravastatin.    Recommend continued regular exercise as tolerated.  Recommend avoiding carbohydrate rich foods and moderation.    Diabetes Quality Metrics    Metric Comments   Most Recent Foot Exam Date: 05/17/2023 Eye exam 11/14/2022 with no diabetic retinopathy, scanned under media tab 11/24/2022   Eye Exam date: 11/14/2022    Prescribed aspirin: No    Prescribed ACE inhibitor: No OR Prescribed ARBs: No    Prescribed statins: Yes    Pneumonia vaccination: 12/07/2015    Smokes Tobacco: No      Most Recent Diabetes Labs  Lab Results   Component Value Date    LDL 43 10/27/2022    ALBCRERATIO 3.7 11/28/2016    NA 134 (L) 02/28/2023    K 4.0 02/28/2023    CREATININE 0.90 02/28/2023

## 2023-05-16 NOTE — Unmapped (Addendum)
-   SBP slightly elevated above goal today. Goal BP <130/80.  - Continue Amlodipine 10 mg and Metop 25 mg BID.

## 2023-05-16 NOTE — Unmapped (Signed)
Family Medicine Office Visit    Assessment/Plan    Problem List Items Addressed This Visit          Endocrine    Hypertension associated with type 2 diabetes mellitus (CMS-HCC)    - SBP slightly elevated above goal today. Goal BP <130/80.  - Continue Amlodipine 10 mg and Metop 25 mg BID.         Type 2 diabetes mellitus with stage 2 chronic kidney disease, without long-term current use of insulin (CMS-HCC) - Primary    DIABETES PLAN     Dm goal is <8.0%  Lab Results   Component Value Date    A1C 10.0 (H) 05/17/2023    A1C 8.7 (H) 10/27/2022    A1C 8.7 (H) 10/03/2022   Not well controlled    BP goal is <130/80  BP Readings from Last 3 Encounters:   05/17/23 135/80   02/28/23 122/84   02/27/23 128/78       The 10-yr ASCVD Risk score Denman George DC Jr., et al., 2013) failed to calculate due to the following reason:  The 2013 10-yr ASCVD risk score is only valid if the patient does not have prior/existing clinical ASCVD (myocardial infarction, stroke, CABG, coronary angioplasty, angina or peripheral arterial disease, coronary atherosclerosis, ischemic heart disease, or cerebrovascular disease). The patient has prior/existing ASCVD.  Had Coronary Angioplasty  Has Angina  Has Coronary Atherosclerosis      Current Diabetes Medications: Metformin 1000 mg PO BID and Jardiance 10 mg once daily (just started 2.5 weeks ago)    Today's Recommendations:  Continue Metformin 1000 mg BID and Jardiance 10 mg once daily    Of note, with starting Jardiance discussed history of UTI. No UTI since 2020. Counseled on increased risk in older adults, those with previous infection, and T2DM. Will alert me ASAP if he develops any potential symptoms.     Continue baby aspirin and pravastatin.    Recommend continued regular exercise as tolerated.  Recommend avoiding carbohydrate rich foods and moderation.    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  - Hep C Ab:  Negative.  - HIV Ab:  Negative.    # Vaccines  - TDaP: 2017.  - pneumovax: 2016, 2017.  - seasonal influenza: Received.  - COVID:  Received 2023.    I personally spent 35 minutes face-to-face and non-face-to-face in the care of this patient, which includes all pre, intra, and post visit time on the date of service.       Subjective:    Chief Complaint   Patient presents with    Follow-up       History of Present Illness  Christopher [name] is a 73 y.o. male who is here for follow-up.    Our last visit 11/24/2022.    Interval events:  - Seen by cardiology 02/27/2023 for follow-up. Stopped Pravastatin.  - ED visit 02/28/2023 for left groin pain.  - Seen by Sports Med 03/06/2023 for left hip pain. Recommended PT and underwent CSI.    Received Jardiance about 2.5 weeks ago.    Physical Exam:  Vitals Reviewed:  BP 135/80 Comment: average - Pulse 69  - Temp 36.3 ??C (97.3 ??F) (Temporal)  - Wt 82.1 kg (181 lb) Comment: pt reported - BMI 30.12 kg/m??    Weight:   Wt Readings from Last 3 Encounters:   05/17/23 82.1 kg (181 lb)   02/28/23 86.6 kg (191 lb)   02/27/23 86.7 kg (191 lb 3.2 oz)     General: well-nourished, no acute distress, pleasant.

## 2023-05-17 ENCOUNTER — Ambulatory Visit: Admit: 2023-05-17 | Discharge: 2023-05-18 | Payer: MEDICARE

## 2023-05-17 LAB — COMPREHENSIVE METABOLIC PANEL
ALBUMIN: 4.2 g/dL (ref 3.4–5.0)
ALKALINE PHOSPHATASE: 83 U/L (ref 46–116)
ALT (SGPT): 16 U/L (ref 10–49)
ANION GAP: 13 mmol/L (ref 5–14)
AST (SGOT): 23 U/L (ref <=34)
BILIRUBIN TOTAL: 0.4 mg/dL (ref 0.3–1.2)
BLOOD UREA NITROGEN: 15 mg/dL (ref 9–23)
BUN / CREAT RATIO: 14
CALCIUM: 10.2 mg/dL (ref 8.7–10.4)
CHLORIDE: 98 mmol/L (ref 98–107)
CO2: 27 mmol/L (ref 20.0–31.0)
CREATININE: 1.08 mg/dL (ref 0.73–1.18)
EGFR CKD-EPI (2021) MALE: 72 mL/min/{1.73_m2} (ref >=60)
GLUCOSE RANDOM: 149 mg/dL (ref 70–179)
POTASSIUM: 4.1 mmol/L (ref 3.4–4.8)
PROTEIN TOTAL: 7.8 g/dL (ref 5.7–8.2)
SODIUM: 138 mmol/L (ref 135–145)

## 2023-05-17 LAB — CBC W/ AUTO DIFF
BASOPHILS ABSOLUTE COUNT: 0 10*9/L (ref 0.0–0.1)
BASOPHILS RELATIVE PERCENT: 0.5 %
EOSINOPHILS ABSOLUTE COUNT: 0.1 10*9/L (ref 0.0–0.5)
EOSINOPHILS RELATIVE PERCENT: 1.6 %
HEMATOCRIT: 40.8 % (ref 39.0–48.0)
HEMOGLOBIN: 13.6 g/dL (ref 12.9–16.5)
LYMPHOCYTES ABSOLUTE COUNT: 2.4 10*9/L (ref 1.1–3.6)
LYMPHOCYTES RELATIVE PERCENT: 36.4 %
MEAN CORPUSCULAR HEMOGLOBIN CONC: 33.3 g/dL (ref 32.0–36.0)
MEAN CORPUSCULAR HEMOGLOBIN: 28.9 pg (ref 25.9–32.4)
MEAN CORPUSCULAR VOLUME: 87 fL (ref 77.6–95.7)
MEAN PLATELET VOLUME: 8.1 fL (ref 6.8–10.7)
MONOCYTES ABSOLUTE COUNT: 0.4 10*9/L (ref 0.3–0.8)
MONOCYTES RELATIVE PERCENT: 6.5 %
NEUTROPHILS ABSOLUTE COUNT: 3.6 10*9/L (ref 1.8–7.8)
NEUTROPHILS RELATIVE PERCENT: 55 %
PLATELET COUNT: 216 10*9/L (ref 150–450)
RED BLOOD CELL COUNT: 4.7 10*12/L (ref 4.26–5.60)
RED CELL DISTRIBUTION WIDTH: 13.7 % (ref 12.2–15.2)
WBC ADJUSTED: 6.6 10*9/L (ref 3.6–11.2)

## 2023-05-17 LAB — LIPID PANEL
CHOLESTEROL/HDL RATIO SCREEN: 3.1 (ref 1.0–4.5)
CHOLESTEROL: 181 mg/dL (ref <=200)
HDL CHOLESTEROL: 58 mg/dL (ref 40–60)
LDL CHOLESTEROL CALCULATED: 63 mg/dL (ref 40–99)
NON-HDL CHOLESTEROL: 123 mg/dL (ref 70–130)
TRIGLYCERIDES: 298 mg/dL — ABNORMAL HIGH (ref 0–150)
VLDL CHOLESTEROL CAL: 59.6 mg/dL — ABNORMAL HIGH (ref 12–42)

## 2023-05-17 NOTE — Unmapped (Signed)
DNR/DNI  HCDM wife, Christopher [name], but if unavailable daughter Christopher [name] [phone]

## 2023-05-18 DIAGNOSIS — N182 Chronic kidney disease, stage 2 (mild): Principal | ICD-10-CM

## 2023-05-18 DIAGNOSIS — E1122 Type 2 diabetes mellitus with diabetic chronic kidney disease: Principal | ICD-10-CM

## 2023-05-31 NOTE — Unmapped (Signed)
Patient is requesting the following refill  Requested Prescriptions     Pending Prescriptions Disp Refills    aspirin (ECOTRIN) 81 MG tablet       Sig: Take 1 tablet (81 mg total) by mouth daily.       Recent Visits  Date Type Provider Dept   05/17/23 Office Visit Amada Kingfisher, MD [facility] Family Medicine [facility]   02/27/23 Office Visit [place], Deatra Robinson, [place] [facility] Cardiology Primary Care [facility]   01/10/23 Office Visit Amada Kingfisher, MD [facility] Family Medicine [place]   12/11/22 Office Visit Teresita Madura, MD [facility] Family Medicine [place]   11/24/22 Office Visit Amada Kingfisher, MD [facility] Family Medicine [place]   10/27/22 Office Visit Amada Kingfisher, MD [facility] Family Medicine [place]   10/03/22 Office Visit Amada Kingfisher, MD [facility] Family Medicine [place]   08/22/22 Office Visit Lorretta Harp, MD [facility]   07/04/22 Telemedicine Amada Kingfisher, MD [facility] Family Medicine [place]   06/06/22 Office Visit Amada Kingfisher, MD [place] Family Medicine [facility]   Showing recent visits within past 365 days and meeting all other requirements  Future Appointments  Date Type Provider Dept   06/28/23 Appointment Amada Kingfisher, MD [facility] Family Medicine [place]   09/06/23 Appointment Lorretta Harp, MD [place] Cardiology Primary [facility]   Showing future appointments within next 365 days and meeting all other requirements       Labs: Not applicable this refill

## 2023-06-09 MED ORDER — PREDNISONE 1 MG TABLET
ORAL_TABLET | Freq: Every day | ORAL | 0 refills | 30 days | Status: CP
Start: 2023-06-09 — End: ?

## 2023-06-15 DIAGNOSIS — I259 Chronic ischemic heart disease, unspecified: Principal | ICD-10-CM

## 2023-06-15 DIAGNOSIS — I240 Acute coronary thrombosis not resulting in myocardial infarction: Principal | ICD-10-CM

## 2023-06-15 DIAGNOSIS — E785 Hyperlipidemia, unspecified: Principal | ICD-10-CM

## 2023-06-15 DIAGNOSIS — I209 Angina pectoris, unspecified: Principal | ICD-10-CM

## 2023-06-15 NOTE — Unmapped (Signed)
 Christopher [name] [name] requested a refill of their Praluent via IVR/Web. [facility] has scheduled delivery per the patients request via UPS to be delivered to their prescription address on 06/19/23.

## 2023-06-18 MED FILL — PRALUENT PEN 75 MG/ML SUBCUTANEOUS PEN INJECTOR: SUBCUTANEOUS | 28 days supply | Qty: 2 | Fill #2

## 2023-06-28 ENCOUNTER — Ambulatory Visit: Admit: 2023-06-28 | Discharge: 2023-06-29 | Payer: MEDICARE

## 2023-06-28 NOTE — Unmapped (Addendum)
-   Well controlled. Goal BP <130/80.  - Continue Amlodipine 10 mg and Metop 25 mg BID.  - CMP 04/2023.

## 2023-06-28 NOTE — Unmapped (Signed)
 Family Medicine Office Visit    Assessment/Plan    Problem List Items Addressed This Visit          Endocrine    Hypertension associated with type 2 diabetes mellitus (CMS-HCC)    - Well controlled. Goal BP <130/80.  - Continue Amlodipine 10 mg and Metop 25 mg BID.  - CMP 04/2023.         Type 2 diabetes mellitus with stage 2 chronic kidney disease, without long-term current use of insulin (CMS-HCC) - Primary    DIABETES PLAN     Dm goal is <8.0%  Lab Results   Component Value Date    A1C 10.0 (H) 05/17/2023    A1C 8.7 (H) 10/27/2022    A1C 8.7 (H) 10/03/2022   6 weeks ago not well controlled but has made significant changes to diet so will recheck today.    BP goal is <130/80  BP Readings from Last 3 Encounters:   06/28/23 122/72   05/17/23 135/80   02/28/23 122/84       The 10-yr ASCVD Risk score Denman George DC Jr., et al., 2013) failed to calculate due to the following reason:  The 2013 10-yr ASCVD risk score is only valid if the patient does not have prior/existing clinical ASCVD (myocardial infarction, stroke, CABG, coronary angioplasty, angina or peripheral arterial disease, coronary atherosclerosis, ischemic heart disease, or cerebrovascular disease). The patient has prior/existing ASCVD.  Had Coronary Angioplasty  Has Angina  Has Coronary Atherosclerosis      Current Diabetes Medications: Metformin 1000 mg PO BID and Jardiance 10 mg once daily (just started 2.5 weeks ago)    Today's Recommendations:  Continue Metformin 1000 mg BID and Jardiance 10 mg once daily    Of note, with starting Jardiance discussed history of UTI. No UTI since 2020. Counseled on increased risk in older adults, those with previous infection, and T2DM. Will alert me ASAP if he develops any potential symptoms.     Continue baby aspirin and pravastatin.    Recommend continued regular exercise as tolerated.  Recommend avoiding carbohydrate rich foods and moderation.    Diabetes Quality Metrics    Metric Comments   Most Recent Foot Exam Date: 05/17/2023 Eye exam 11/14/2022 with no diabetic retinopathy, scanned under media tab 11/24/2022   Eye Exam date: 11/14/2022    Prescribed aspirin: Yes    Prescribed ACE inhibitor: No OR Prescribed ARBs: No    Prescribed statins: No    Pneumonia vaccination: 12/07/2015    Smokes Tobacco: No      Most Recent Diabetes Labs  Lab Results   Component Value Date    LDL 63 05/17/2023    ALBCRERATIO 3.7 11/28/2016    NA 138 05/17/2023    K 4.1 05/17/2023    CREATININE 1.08 05/17/2023               # Screening and Prevention  - Colorectal Screening:  Brought records from [place] (scanned) Colonoscopy performed 2015 - three 2-5 mm hyperplastic polyps in the sigmoid colon, internal hemorrhoids, repeat 10 years (2025). Declines further screening today.  - Hep C Ab:  Negative.  - HIV Ab:  Negative.    # Vaccines  - TDaP: 2017.  - pneumovax: 2016, 2017.  - seasonal influenza: Received.  - COVID:  Received 2023.    I personally spent 35 minutes face-to-face and non-face-to-face in the care of this patient, which includes all pre, intra, and post visit time on the date of service.  Subjective:    Chief Complaint   Patient presents with    Follow-up     On diabetes        History of Present Illness  Christopher [name] is a 73 y.o. male who is here for follow-up.    HTN: Took meds around 11 AM which is late for him -- Amlodipine 10 mg once daily and Metop 25 mg BID    T2DM: Taking Jardiance 10 mg once daily. No side effects. Cut out regular pepsi. Now drinking diet pepsi and water. Decreased sugar in sweet tea.      Physical Exam:  Vitals Reviewed:  BP 122/72  - Pulse 66  - Temp 36.3 ??C (97.3 ??F) (Temporal)  - Ht 165.1 cm (5' 5) Comment: pt reported - Wt 78.5 kg (173 lb)  - BMI 28.79 kg/m??    Weight:   Wt Readings from Last 3 Encounters:   06/28/23 78.5 kg (173 lb)   05/17/23 82.1 kg (181 lb)   02/28/23 86.6 kg (191 lb)     General: well-nourished, no acute distress, pleasant.   Head: normocephalic, atraumatic  Eyes: sclera clear, conjunctiva normal, external ocular movements intact.  Heart: RRR, no M/R/G.   Lungs: CTAB, no wheezes.

## 2023-06-28 NOTE — Unmapped (Addendum)
 DIABETES PLAN     Dm goal is <8.0%  Lab Results   Component Value Date    A1C 10.0 (H) 05/17/2023    A1C 8.7 (H) 10/27/2022    A1C 8.7 (H) 10/03/2022   6 weeks ago not well controlled but has made significant changes to diet so will recheck today.    BP goal is <130/80  BP Readings from Last 3 Encounters:   06/28/23 122/72   05/17/23 135/80   02/28/23 122/84       The 10-yr ASCVD Risk score Christopher George DC Jr., et al., 2013) failed to calculate due to the following reason:  The 2013 10-yr ASCVD risk score is only valid if the patient does not have prior/existing clinical ASCVD (myocardial infarction, stroke, CABG, coronary angioplasty, angina or peripheral arterial disease, coronary atherosclerosis, ischemic heart disease, or cerebrovascular disease). The patient has prior/existing ASCVD.  Had Coronary Angioplasty  Has Angina  Has Coronary Atherosclerosis      Current Diabetes Medications: Metformin 1000 mg PO BID and Jardiance 10 mg once daily (just started 2.5 weeks ago)    Today's Recommendations:  Continue Metformin 1000 mg BID and Jardiance 10 mg once daily    Of note, with starting Jardiance discussed history of UTI. No UTI since 2020. Counseled on increased risk in older adults, those with previous infection, and T2DM. Will alert me ASAP if he develops any potential symptoms.     Continue baby aspirin and pravastatin.    Recommend continued regular exercise as tolerated.  Recommend avoiding carbohydrate rich foods and moderation.    Diabetes Quality Metrics    Metric Comments   Most Recent Foot Exam Date: 05/17/2023 Eye exam 11/14/2022 with no diabetic retinopathy, scanned under media tab 11/24/2022   Eye Exam date: 11/14/2022    Prescribed aspirin: Yes    Prescribed ACE inhibitor: No OR Prescribed ARBs: No    Prescribed statins: No    Pneumonia vaccination: 12/07/2015    Smokes Tobacco: No      Most Recent Diabetes Labs  Lab Results   Component Value Date    LDL 63 05/17/2023    ALBCRERATIO 3.7 11/28/2016    NA 138 05/17/2023    K 4.1 05/17/2023    CREATININE 1.08 05/17/2023

## 2023-06-29 NOTE — Unmapped (Signed)
-----   Message from Abel Presto sent at 06/28/2023  3:26 PM EST -----  Please call pt back - his insurance will not cover meds  [phone]

## 2023-06-29 NOTE — Unmapped (Signed)
 Spoke to patient stated he got a letter from insurance won't pay for Praluent inj but will pay for Repatha inj. Could you send in new rx for this.

## 2023-07-02 DIAGNOSIS — F32A Depression, unspecified depression type: Principal | ICD-10-CM

## 2023-07-02 DIAGNOSIS — E1122 Type 2 diabetes mellitus with diabetic chronic kidney disease: Principal | ICD-10-CM

## 2023-07-02 DIAGNOSIS — N182 Chronic kidney disease, stage 2 (mild): Principal | ICD-10-CM

## 2023-07-02 DIAGNOSIS — G8929 Other chronic pain: Principal | ICD-10-CM

## 2023-07-02 DIAGNOSIS — M549 Dorsalgia, unspecified: Principal | ICD-10-CM

## 2023-07-02 MED ORDER — SERTRALINE 100 MG TABLET
ORAL_TABLET | Freq: Every day | ORAL | 3 refills | 0.00 days
Start: 2023-07-02 — End: ?

## 2023-07-02 MED ORDER — REPATHA SURECLICK 140 MG/ML SUBCUTANEOUS PEN INJECTOR
SUBCUTANEOUS | 3 refills | 0.00 days | Status: CP
Start: 2023-07-02 — End: 2024-07-01
  Filled 2023-07-12: qty 6, 84d supply, fill #0

## 2023-07-02 MED ORDER — METFORMIN 1,000 MG TABLET
ORAL_TABLET | 3 refills | 0.00 days
Start: 2023-07-02 — End: ?

## 2023-07-02 MED ORDER — TAMSULOSIN 0.4 MG CAPSULE
ORAL_CAPSULE | Freq: Every day | ORAL | 3 refills | 90.00 days | Status: CP
Start: 2023-07-02 — End: ?

## 2023-07-02 MED ORDER — GABAPENTIN 300 MG CAPSULE
ORAL_CAPSULE | 3 refills | 0.00 days
Start: 2023-07-02 — End: ?

## 2023-07-02 NOTE — Unmapped (Signed)
 07/02/23  9:02 PM    Insurer now prefers repatha over praluent. Prescribed to [place] [facility].    Lorretta Harp, MD, PhD  [facility]  Cardiology

## 2023-07-03 DIAGNOSIS — I240 Acute coronary thrombosis not resulting in myocardial infarction: Principal | ICD-10-CM

## 2023-07-03 DIAGNOSIS — G8929 Other chronic pain: Principal | ICD-10-CM

## 2023-07-03 DIAGNOSIS — M549 Dorsalgia, unspecified: Principal | ICD-10-CM

## 2023-07-03 DIAGNOSIS — I209 Angina pectoris, unspecified: Principal | ICD-10-CM

## 2023-07-03 DIAGNOSIS — E785 Hyperlipidemia, unspecified: Principal | ICD-10-CM

## 2023-07-03 DIAGNOSIS — E1122 Type 2 diabetes mellitus with diabetic chronic kidney disease: Principal | ICD-10-CM

## 2023-07-03 DIAGNOSIS — N182 Chronic kidney disease, stage 2 (mild): Principal | ICD-10-CM

## 2023-07-03 DIAGNOSIS — F32A Depression, unspecified depression type: Principal | ICD-10-CM

## 2023-07-03 DIAGNOSIS — I259 Chronic ischemic heart disease, unspecified: Principal | ICD-10-CM

## 2023-07-03 MED ORDER — METFORMIN 1,000 MG TABLET
ORAL_TABLET | 3 refills | 0.00 days
Start: 2023-07-03 — End: ?

## 2023-07-03 MED ORDER — OMEPRAZOLE 20 MG CAPSULE,DELAYED RELEASE
ORAL_CAPSULE | Freq: Every day | ORAL | 3 refills | 90.00 days | Status: CP
Start: 2023-07-03 — End: ?

## 2023-07-03 MED ORDER — SERTRALINE 100 MG TABLET
ORAL_TABLET | Freq: Every day | ORAL | 1 refills | 90.00 days | Status: CP
Start: 2023-07-03 — End: ?

## 2023-07-03 MED ORDER — GABAPENTIN 300 MG CAPSULE
ORAL_CAPSULE | Freq: Two times a day (BID) | ORAL | 3 refills | 45.00 days | Status: CP
Start: 2023-07-03 — End: ?

## 2023-07-03 NOTE — Unmapped (Signed)
 Notified patient new rx for repatha sent to pharmacy. V/u

## 2023-07-05 NOTE — Unmapped (Signed)
 error

## 2023-07-09 NOTE — Unmapped (Signed)
 [facility] FAMILY MEDICINE [place] POPULATION HEALTH  Care Management Progress Note    Date: 07/09/2023  Outcome:  Phone outreach completed    Purpose of contact:           Follow up on CCM. CM discussed the following:    General  Pt reports doing well. Taking medications as directed. SDOH screening was completed and denied need for resource coordination at this time.    Medication  Pt asked questions for process for the new prescribed medication by his cardiologist, evolocumab (REPATHA SURECLICK) 140 mg/mL PnIj. Pt understands how to fill out the paperwork and bring it to or send a Mychart message to Lorretta Harp, MD for the physician's portion. Pt denies questions or need help in this process at this time.    CM reassignment  CM shared the planned leave in early May. CM explained a new CM will be assigned in early July and in the meantime, Pt can leave a message if any issues arise. Pt expressed understanding. CM and Pt shared mutual gratitude in sharing the care journey.       Barriers to Care:  N/A    Provider/Care Partner(s) to follow up on:   N/A    Health Maintenance:  Health Maintenance Due   Topic Date Due    Medicare Annual Wellness Visit (AWV)  06/05/2014    Zoster Vaccines (2 of 3) 06/16/2014    COVID-19 Vaccine (5 - 2024-25 season) 12/24/2022    Colon Cancer Screening  07/09/2023         Additional Information/Plan:  Patient provided my direct contact information and encouraged to contact me should additional needs arise.    Time Spent Per Day:  Chart review was completed prior to outreach attempt.   07/09/2023: 20      Durene Romans [place]  Population Health  [facility] FAMILY MEDICINE [place]

## 2023-07-10 ENCOUNTER — Ambulatory Visit: Admit: 2023-07-10 | Discharge: 2023-07-11 | Payer: MEDICARE

## 2023-07-10 DIAGNOSIS — M7662 Achilles tendinitis, left leg: Principal | ICD-10-CM

## 2023-07-10 NOTE — Unmapped (Signed)
 Thank-you for coming to [facility]      Recommendations/Plan  Medications: voltaren gel, lidocaine patches.   Activity modifications : home stretches as tolerated   Follow-up: As needed.  Please contact us via MyChart or phone if any problems arise.     Contact my clinical staff, at [phone] for any questions/concerns. Voicemail is checked Monday-Friday from 8:00 am- 4:00 pm      To schedule an appointment with Melonie [place], PA-C at [facility] please call: [phone]     MyChart messages: These messages can be sent to your provider and will be checked by their clinical support staff.? The messages are checked throughout the day during normal business hours from 8:30 am-4:00 pm Monday-Friday, however responses may take up to 48 hours.? Please use this method of communication for non-urgent and non-emergent concerns, questions, refill requests or inquiries only.? ?Our team will help respond to all of your questions.? Please note that you may be asked to see a provider by either a telehealth or in person visit if it is deemed your questions are best handled in the clinic setting in person.??  ?  Please keep in mind, these messages are not real time communications, so be patient when waiting for a response.  ?  If you have an issue that requires emergent attention that cannot wait; either call the Orthopaedics resident on call at [phone], consider coming to our [facility] walk-in clinic, or go to the nearest Emergency Department.    We look forward to seeing you again in the future and appreciate you choosing [facility] for your care!

## 2023-07-10 NOTE — Unmapped (Signed)
 Orthopaedic Foot and Ankle Division  Encounter Provider: Arlyn Leak, PA  Date of Service: 07/10/2023     Primary Care Provider: Amada Kingfisher, MD  Referring Provider: Referred Self  No diagnosis found. Orthopaedic notes: - s/p left cavovarus foot s/p peroneal tendon repair, calc and 1st MT osteotomy on 07/20/2017 (Attending: [facility])  - s/p left great toe prox phalanx cheilectomy, 2nd MT Weil osteotomy on 06/06/2018 (Attending: [facility])  PROMIS Physical Function CAT:     PROMIS Pain Interference CAT:       History:  No chief complaint on file.  (Data reviewed and verified by encounter provider.)  HPI:  History of Present Illness  Christopher [name] is a 73 year old male who presents with left achilles pain.  History is significant for s/p left cavovarus foot s/p peroneal tendon repair, calc and 1st MT osteotomy on 07/20/2017 (Attending: [facility])  - s/p left great toe prox phalanx cheilectomy, 2nd MT Weil osteotomy on 06/06/2018 (Attending: [facility])    He experiences progressive pain in his left achilles area, which is particularly severe when sitting and attempting to elevate his foot on a surface. He finds relief by keeping it on the floor. The pain is described as sharp when standing and more of a pressure sensation otherwise. It does not occur while walking but intensifies when sitting and relaxing with the foot elevated. No new injury, bruising, or redness is noted.   He is currently taking gabapentin for pain relief but cannot take ibuprofen or other anti-inflammatory medications. He has been using topical pain relief treatments, which provide some benefit.      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    Corneal abrasion 1975    steel in right eye with abrasion and removal    Cubital tunnel syndrome on left 01/16/2019    Depression 2016    Diabetes mellitus (CMS-HCC)     GERD (gastroesophageal reflux disease) 1982    Hypertension     Meningitis 12/25/2015    Mixed hyperlipidemia     Myocardial infarction (CMS-HCC) 1986    Obesity 1986    first back surgery    Peptic ulceration       Surgical History Past Surgical History:   Procedure Laterality Date    BACK SURGERY      CARDIAC CATHETERIZATION      STENTS PLACEMENT     CARDIAC SURGERY  2005    stents    CORONARY STENT PLACEMENT      FOREIGN BODY REMOVAL Right about 20 years ago    HERNIA REPAIR      RIGHT INGUNIAL    JOINT REPLACEMENT Left     TOTAL SHOULDER    PR ARM/ELBOW TENDON LENGTHEN,SINGLE,EA Left 02/17/2019    Procedure: TENDON LENGTHENING UPPER ARM/ELBOW SNGL EA;  Surgeon: Daisy Lazar, MD;  [facility]: [facility];  Service: Orthopedics    PR CABG, ARTERIAL, SINGLE Midline 03/20/2022    Procedure: CORONARY ARTERY BYPASS, USING ARTERIAL GRAFT(S); SINGLE ARTERIAL GRAFT;  Surgeon: Arlester Marker, MD;  Location: MAIN OR [facility];  Service: Cardiac Surgery  PR CABG, ARTERY-VEIN, SINGLE Midline 03/20/2022    Procedure: CORONARY ARTERY BYPASS, USING VENOUS GRAFT(S) AND ARTERIAL GRAFT(S); SINGLE VEIN GRAFT;  Surgeon: Arlester Marker, MD;  Location: MAIN OR [facility];  Service: Cardiac Surgery    PR CATH PLACE/CORON ANGIO, IMG SUPER/INTERP,W LEFT HEART VENTRICULOGRAPHY N/A 02/01/2022    Procedure: Left Heart Catheterization;  Surgeon: Jacquelyne Balint, MD;  Location[facility] CATH;  Service: Cardiology    PR ENDOSCOPY W/VIDEO-ASST VEIN HARVEST,CABG Right 03/20/2022    Procedure: ENDOSCOPY, SURGICAL, INCLUDING VIDEO-ASSISTED HARVEST OF VEIN(S) FOR CORONARY ARTERY BYPASS PROCEDURE;  Surgeon: Arlester Marker, MD;  Location: [facility];  Service: Cardiac Surgery    PR OSTEOTOMY 1ST METATARSAL,BASE/SHAFT Left 07/20/2017    Procedure: OSTEOTOMY, W/WO LENGTHENING, SHORTENING OR ANGULAR CORRECTION, METATARSAL; FIRST METATARSAL;  Surgeon: Valda Favia, MD;  Location: ASC OR [facility];  Service: Ortho Foot & Ankle    PR OSTEOTOMY HEEL BONE Left 07/20/2017    Procedure: OSTEOTOMYLoistine Chance W/WO INT FIXA;  Surgeon: Valda Favia, MD;  Location: ASC OR [facility];  Service: Ortho Foot & Ankle    PR OSTEOTOMY METATARSAL (NOT 1ST) Left 06/06/2018    Procedure: OSTEOTOMY, W/WO LENGTHENING, SHORTENING OR ANGULAR CORRECTION, METATARSAL; OTHER THAN FIRST METATARSAL, EA;  Surgeon: Valda Favia, MD;  Location: ASC OR [facility];  Service: Orthopedics    PR PART REMV PHALANX OF TOE Left 06/06/2018    Procedure: PART EXC BONE PHALANX TOE;  Surgeon: Valda Favia, MD;  Location: ASC OR [facility];  Service: Orthopedics    PR RECONSTR TOTAL SHOULDER IMPLANT Left 11/12/2018    Procedure: R28   ARTHROPLASTY, GLENOHUMERAL JOINT; TOTAL SHOULDER(GLENOID & PROXIMAL HUMERAL REPLACEMENT(EG, TOTAL SHOULDER);  Surgeon: Nickola Major, MD;  [facility] OR [facility];  Service: Orthopedics    PR REMOVAL IMPLANT DEEP Left 11/12/2018    Procedure: REMOVE IMPLANT; DEEP SHOULDER;  Surgeon: Nickola Major, MD;  [facility];  Service: Orthopedics    PR REPAIR BICEPS LONG TENDON Left 11/12/2018    Procedure: TENODESIS LONG TENDON BICEPS;  Surgeon: Nickola Major, MD;  [facility] OR [facility];  Service: Orthopedics    PR REPAIR PERONEAL TENDONS,FIB OSTEOTMY Left 07/20/2017    Procedure: REPR DISLOC PERONEAL TENDONS; W/FIB OSTEOTOMY;  Surgeon: Valda Favia, MD;  Location: [facility] OR [facility];  Service: Ortho Foot & Ankle    PR REVISE MEDIAN N/CARPAL TUNNEL SURG Left 02/17/2019    Procedure: R22 NEUROPLASTY AND/OR TRANSPOSITION; MEDIAN NERVE AT CARPAL TUNNEL;  Surgeon: Daisy Lazar, MD;  Location: ASC OR [facility];  Service: Orthopedics    PR REVISE ULNAR NERVE AT ELBOW Left 02/17/2019 Procedure: NEUROPLASTY AND/OR TRANSPOSITION; ULNAR NERVE AT ELBOW;  Surgeon: Daisy Lazar, MD;  Location: ASC OR [facility];  Service: Orthopedics    SHOULDER SURGERY Bilateral     L TSA, R ROTATOR CUFF      SKIN BIOPSY      SPINE SURGERY  1986/1995/2000/2002    LUMBAR FUSION    stent        Allergies Morphine, Atorvastatin, Atorvastatin calcium, Baclofen, Ibuprofen, Pneumococcal 23-valent polysaccharide vaccine, and Zetia [ezetimibe]   Medications He has a current medication list which includes the following prescription(s): amlodipine, aspirin, true metrix level 1, true metrix glucose test strip, blood-glucose meter, diclofenac sodium, empagliflozin, repatha sureclick, fluticasone propionate, gabapentin, metformin, metoprolol tartrate, omeprazole, sertraline, tamsulosin, trazodone, trospium, and trueplus lancets.   Family History His family history includes Alcohol abuse in his father; COPD  in his brother, brother, and sister; Cancer in his father and maternal grandmother; Cataracts in his father, mother, and paternal grandfather; Diabetes in his brother, father, mother, sister, and sister; Early death in his brother; Hypertension in his father, maternal grandfather, maternal grandmother, mother, paternal grandfather, and paternal grandmother; Kidney disease in his maternal aunt; Melanoma in his father; No Known Problems in his maternal uncle, paternal aunt, paternal uncle, and another family member; Stroke in his mother.   Social History He reports that he has never smoked. He has never been exposed to tobacco smoke. He has never used smokeless tobacco. He reports that he does not currently use drugs. He reports that he does not drink alcohol.Home [address]  [place] [address]  Occupation:         Occupational History    Occupation: Insurance claims handler work     Social History     Social History Narrative    Not on file            Exam:  There were no encounter diagnoses.   Estimated body mass index is 28.79 kg/m?? as calculated from the following:    Height as of 06/28/23: 165.1 cm (5' 5).    Weight as of 06/28/23: 78.5 kg (173 lb).     Musculoskeletal   Left ankle    Tenderness at the achilles tendon without defect. Mild tenderness at the retrocalcaneal bursa.   ROM of the ankle with pf/df is without pain  Strength 5/5  Janee Morn test is negative.   No point tenderness at the calcaneus, negative calcaneal squeeze test            Pulses well-perfused distally, 2+ DP and posterior tibial pulses    Swelling no swelling    Neurologic Sensation to light touch distally normal   Skin Benign, no lesions        Test Results  There were no encounter diagnoses.  Lab Results   Component Value Date    A1C 8.0 (H) 06/28/2023       No results found for: VITD    Imaging  No orders of the defined types were placed in this encounter.  Previous imaging was personally reviewed and interpreted by the encounter provider today.Marland Kitchen           Christopher [name] is a 73 y.o. male   ASSESSMENT and PLAN     Assessment & Plan  Achilles tendonitis  Chronic left heel pain with tenderness at mid-Achilles. Differential includes tendonitis or bursitis. Tendon intact. Prefers conservative management, avoiding oral medications.   - Recommend topical pain relief as needed.  - Suggest compression Achilles sleeves for support.  - Consider gel heel cup to reduce pressure on the Achilles tendon.  - Discuss lidocaine patches and voltaren gel for pain relief.  -follow up with non op sports if symptoms worsen or fail to improve.        No diagnosis found. Orthopaedic notes: - s/p left cavovarus foot s/p peroneal tendon repair, calc and 1st MT osteotomy on 07/20/2017 (Attending: [facility])  - s/p left great toe prox phalanx cheilectomy, 2nd MT Weil osteotomy on 06/06/2018 (Attending: [facility])  PROMIS Physical Function CAT:     PROMIS Pain Interference CAT: [email or web address]       Requested Prescriptions      No prescriptions requested or ordered in this encounter      No orders of the defined types were placed in this encounter.  MDM (Two highest determine E&M code)   Problems 1 or more chronic illnesses with exacerbation, progression, or side effects of treatment (28413, 99214)   Data [Use LOS Wand to tabulate number of outside records reviewed, ordering tests, reviewing tests; use of independent historian; discussion with other provider]   Risk Low risk of morbidity from additional diagnostic testing or treatment (99203/99213)

## 2023-07-10 NOTE — Unmapped (Signed)
 [facility] Specialty Medication Onboarding    Specialty Medication: Repatha  Prior Authorization: Approved   Financial Assistance: Yes - grant approved as secondary   Final Copay/Day Supply: $0 / 84 days     Insurance Restrictions: None     Notes to Pharmacist: Previously on [employer] on File: not applicable  Start Date on Rx:  07/02/23    The triage team has completed the benefits investigation and has determined that the patient is able to fill this medication at [facility]. Please contact the patient to complete the onboarding or follow up with the prescribing physician as needed.

## 2023-07-11 NOTE — Unmapped (Signed)
 [facility] has reached out to this patient via MyChart to onboard them to our Specialty Lite services for their Repatha. Their medication has never been filled.They will now receive proactive outreach from the pharmacy team for refills.    Tera Helper, [facility] Specialty and Home Delivery Pharmacist

## 2023-07-12 NOTE — Unmapped (Signed)
 Christopher [name] requested a refill of their Repatha via IVR/Web. The [facility] has scheduled delivery per the patients request via Next Day Courier to be delivered to their prescription address on 07/13/23.

## 2023-07-16 MED ORDER — TRUE METRIX LEVEL 1 SOLUTION
3 refills | 0 days
Start: 2023-07-16 — End: ?

## 2023-07-16 MED ORDER — TRUEPLUS LANCETS 33 GAUGE
3 refills | 0 days
Start: 2023-07-16 — End: ?

## 2023-07-17 MED ORDER — TRUE METRIX LEVEL 1 SOLUTION
3 refills | 0 days | Status: CP
Start: 2023-07-17 — End: ?

## 2023-07-17 MED ORDER — TRUEPLUS LANCETS 33 GAUGE
3 refills | 0 days | Status: CP
Start: 2023-07-17 — End: ?

## 2023-07-23 MED ORDER — TRUE METRIX GLUCOSE TEST STRIP
ORAL_STRIP | 3 refills | 0 days | Status: CP
Start: 2023-07-23 — End: ?

## 2023-07-25 ENCOUNTER — Ambulatory Visit: Admit: 2023-07-25 | Discharge: 2023-07-26

## 2023-07-25 ENCOUNTER — Inpatient Hospital Stay: Admit: 2023-07-25 | Discharge: 2023-07-26

## 2023-07-25 DIAGNOSIS — Z96612 Presence of left artificial shoulder joint: Principal | ICD-10-CM

## 2023-07-25 NOTE — Unmapped (Signed)
 SPORTS MEDICINE POSTOPERATIVE VISIT    DATE OF PROCEDURE: 11/12/2018    PROCEDURE PERFORMED: Left reverse shoulder arthroplasty    ASSESSMENT      Christopher [name] is a 73 y.o. male  s/p above    PLAN:    -- I am not really sure where his discomfort is coming from as his x-rays look excellent today and his function is pretty well-maintained.  I think it would be reasonable to get a primary care sports appointment to do a dynamic ultrasound to see if he is having any impingement on his anterior shoulder at the coracoid or possibly his acromioclavicular joint or if he has any fluid to potentially aspirate although my concern for an infection is quite low based on his current symptoms.  After that appointment I will see him back in 4 to 6 weeks just to double check on him.    --Imaging findings, further diagnostic and therapeutic options were reviewed with the patient, along with the benefits and drawbacks of each, and the patient expressed understanding    Prescriptions today: None    Follow-up: As above    X-rays at next visit:  None      SUBJECTIVE       History of Present Illness: 73 y.o. male who presents for for evaluation of his left shoulder.  He he states that he started develop some anterior shoulder pain with crossarm adduction about 2 months ago.  He has had no issues with his shoulder otherwise since his surgery in 2020 pleased with his surgery.  Comes in today for evaluation.      Surgical Hx Past Surgical History:   Procedure Laterality Date    BACK SURGERY      CARDIAC CATHETERIZATION      STENTS PLACEMENT     CARDIAC SURGERY  2005    stents    CORONARY STENT PLACEMENT      FOREIGN BODY REMOVAL Right about 20 years ago    HERNIA REPAIR      RIGHT INGUNIAL    JOINT REPLACEMENT Left     TOTAL SHOULDER    PR ARM/ELBOW TENDON LENGTHEN,SINGLE,EA Left 02/17/2019    Procedure: TENDON LENGTHENING UPPER ARM/ELBOW SNGL EA;  Surgeon: Daisy Lazar, MD;  Location: [facility] OR [facility]; Service: Orthopedics    PR CABG, ARTERIAL, SINGLE Midline 03/20/2022    Procedure: CORONARY ARTERY BYPASS, USING ARTERIAL GRAFT(S); SINGLE ARTERIAL GRAFT;  Surgeon: Arlester Marker, MD;  Location: MAIN OR [facility];  Service: Cardiac Surgery    PR CABG, ARTERY-VEIN, SINGLE Midline 03/20/2022    Procedure: CORONARY ARTERY BYPASS, USING VENOUS GRAFT(S) AND ARTERIAL GRAFT(S); SINGLE VEIN GRAFT;  Surgeon: Arlester Marker, MD;  Location: MAIN OR [facility];  Service: Cardiac Surgery    PR CATH PLACE/CORON ANGIO, IMG SUPER/INTERP,W LEFT HEART VENTRICULOGRAPHY N/A 02/01/2022    Procedure: Left Heart Catheterization;  Surgeon: Jacquelyne Balint, MD;  Location: [facility] CATH;  Service: Cardiology    PR ENDOSCOPY W/VIDEO-ASST VEIN HARVEST,CABG Right 03/20/2022    Procedure: ENDOSCOPY, SURGICAL, INCLUDING VIDEO-ASSISTED HARVEST OF VEIN(S) FOR CORONARY ARTERY BYPASS PROCEDURE;  Surgeon: Arlester Marker, MD;  Location: MAIN OR [facility];  Service: Cardiac Surgery    PR OSTEOTOMY 1ST METATARSAL,BASE/SHAFT Left 07/20/2017    Procedure: OSTEOTOMY, W/WO LENGTHENING, SHORTENING OR ANGULAR CORRECTION, METATARSAL; FIRST METATARSAL;  Surgeon: Valda Favia, MD;  Location: ASC OR [facility];  Service: Ortho Foot & Ankle    PR OSTEOTOMY HEEL BONE Left 07/20/2017  Procedure: OSTEOTOMY; CALCAN W/WO INT FIXA;  Surgeon: Valda Favia, MD;  Location: ASC OR [facility];  Service: Ortho Foot & Ankle    PR OSTEOTOMY METATARSAL (NOT 1ST) Left 06/06/2018    Procedure: OSTEOTOMY, W/WO LENGTHENING, SHORTENING OR ANGULAR CORRECTION, METATARSAL; OTHER THAN FIRST METATARSAL, EA;  Surgeon: Valda Favia, MD;  Location: ASC OR [facility];  Service: Orthopedics    PR PART REMV PHALANX OF TOE Left 06/06/2018    Procedure: PART EXC BONE PHALANX TOE;  Surgeon: Valda Favia, MD;  Location: ASC OR [facility];  Service: Orthopedics    PR RECONSTR TOTAL SHOULDER IMPLANT Left 11/12/2018    Procedure: R28   ARTHROPLASTY, GLENOHUMERAL JOINT; TOTAL SHOULDER(GLENOID & PROXIMAL HUMERAL REPLACEMENT(EG, TOTAL SHOULDER);  Surgeon: Nickola Major, MD;  Location: [facility] OR [facility];  Service: Orthopedics    PR REMOVAL IMPLANT DEEP Left 11/12/2018    Procedure: REMOVE IMPLANT; DEEP SHOULDER;  Surgeon: Nickola Major, MD;  [facility] OR [facility];  Service: Orthopedics    PR REPAIR BICEPS LONG TENDON Left 11/12/2018    Procedure: TENODESIS LONG TENDON BICEPS;  Surgeon: Nickola Major, MD;  Location: [facility] OR [facility];  Service: Orthopedics    PR REPAIR PERONEAL TENDONS,FIB OSTEOTMY Left 07/20/2017    Procedure: REPR DISLOC PERONEAL TENDONS; W/FIB OSTEOTOMY;  Surgeon: Valda Favia, MD;  Location: ASC OR [facility];  Service: Ortho Foot & Ankle    PR REVISE MEDIAN N/CARPAL TUNNEL SURG Left 02/17/2019    Procedure: R22 NEUROPLASTY AND/OR TRANSPOSITION; MEDIAN NERVE AT CARPAL TUNNEL;  Surgeon: Daisy Lazar, MD;  Location: ASC OR [facility];  Service: Orthopedics    PR REVISE ULNAR NERVE AT ELBOW Left 02/17/2019    Procedure: NEUROPLASTY AND/OR TRANSPOSITION; ULNAR NERVE AT ELBOW;  Surgeon: Daisy Lazar, MD;  Location: ASC OR [facility];  Service: Orthopedics    SHOULDER SURGERY Bilateral     L TSA, R ROTATOR CUFF      SKIN BIOPSY      SPINE SURGERY  1986/1995/2000/2002    LUMBAR FUSION    stent            OBJECTIVE     Physical Exam:    DETAILED PHYSICAL EXAM  General Appearance well-nourished and no acute distress   Vitals Estimated body mass index is 28.79 kg/m?? as calculated from the following:    Height as of 06/28/23: 165.1 cm (5' 5).    Weight as of 06/28/23: 78.5 kg (173 lb).   Mood and Affect alert, cooperative, and pleasant       Cardiovascular well-perfused distally and no swelling     MUSCULOSKELETAL   LEFT:      SHOULDER:  Healed surgical incision  FE 160, ER 45, IR back pocket  Discomfort with cross arm adduction  No feelings of instability or subluxation         Imaging/other tests: No adverse features on reverse shoulder with no signs of loosening or notching.  We also compared these x-rays to his last x-rays in 2020.    Orthopaedic PROMIS:  Failed to redirect to the Timeline version of the REVFS SmartLink.         No data to display                PRO Status:  Patient completed PRO.  Patient IQ  Physical function 55.4  Pain interference 57.8

## 2023-07-27 DIAGNOSIS — N182 Chronic kidney disease, stage 2 (mild): Principal | ICD-10-CM

## 2023-07-27 DIAGNOSIS — E1122 Type 2 diabetes mellitus with diabetic chronic kidney disease: Principal | ICD-10-CM

## 2023-07-27 MED ORDER — JARDIANCE 10 MG TABLET
ORAL_TABLET | Freq: Every day | ORAL | 2 refills | 90 days | Status: CP
Start: 2023-07-27 — End: ?

## 2023-08-14 ENCOUNTER — Ambulatory Visit: Admit: 2023-08-14 | Discharge: 2023-08-15 | Payer: MEDICARE

## 2023-08-14 DIAGNOSIS — Z1283 Encounter for screening for malignant neoplasm of skin: Principal | ICD-10-CM

## 2023-08-14 DIAGNOSIS — H16139 Photokeratitis, unspecified eye: Principal | ICD-10-CM

## 2023-08-14 DIAGNOSIS — L989 Disorder of the skin and subcutaneous tissue, unspecified: Principal | ICD-10-CM

## 2023-08-14 DIAGNOSIS — L570 Actinic keratosis: Principal | ICD-10-CM

## 2023-08-14 NOTE — Unmapped (Addendum)
 Skin Cancer Prevention: Care Instructions  Your Care Instructions     Skin cancer is the abnormal growth of cells in the skin. It usually appears as a growth that changes in color, shape, or size. This can be a sore that does not heal or a change in a mole. Skin cancer is almost always curable when found early and treated. So it is important to see your doctor if you have any of these changes in your skin.  Skin cancer is the most common type of cancer. It often appears on areas of the body that have been exposed to the sun, such as the head, face, neck, back, chest, or shoulders.  Follow-up care is a key part of your treatment and safety. Be sure to make and go to all appointments, and call your doctor if you are having problems. It's also a good idea to know your test results and keep a list of the medicines you take.  How can you care for yourself at home?  Wear a wide-brimmed hat and long sleeves and pants if you are going to be outdoors for a long time.  Avoid the sun between 10 a.m. and 4 p.m., which is the peak time for UV rays.  Wear sunscreen on exposed skin. Make sure to use a broad-spectrum sunscreen that has a sun protection factor (SPF) of 30 or higher. Use it every day, even when it is cloudy.  Do not use tanning booths or sunlamps.  Use lip balm or cream that has sun protection factor (SPF) to protect your lips from getting sunburned.  Wear sunglasses that block UV rays.  When should you call for help?  Watch closely for changes in your health, and be sure to contact your doctor if:    You see a change in your skin, such as a growth or mole that:  Grows bigger. This may happen very slowly.  Changes color.  Changes shape.  Starts to bleed easily.     You have swollen glands in your armpits, groin, or neck.     You do not get better as expected.   Where can you learn more?  Go to [facility] at [email or web address] in the Menu. Enter P392 in the search box to learn more about Skin Cancer Prevention: Care Instructions.  Current as of: March 09, 2022  Content Version: 14.3  ?? 2024 [place], [place].   Care instructions adapted under license by [facility]. If you have questions about a medical condition or this instruction, always ask your healthcare professional. Laren Player, [facility], disclaims any warranty or liability for your use of this information.     Actinic Keratosis: Care Instructions  Overview  Actinic keratosis is a skin growth caused by sun damage. It can turn into skin cancer, but this isn't common. Actinic keratoses, also called solar keratoses, are small red, brown, or skin-colored scaly patches. They are most common on the scalp, face, neck, hands, and forearms.  Your doctor can remove these growths by freezing or scraping them off or by putting medicines on them.  Follow-up care is a key part of your treatment and safety. Be sure to make and go to all appointments, and call your doctor if you are having problems. It's also a good idea to know your test results and keep a list of the medicines you take.  How can you care for yourself at home?  If your doctor told you how to care  for the treated area, follow your doctor's instructions. If you did not get instructions, follow this general advice:  Wash around the area with clean water  2 times a day. Don't use hydrogen peroxide or alcohol. They can slow healing.  You may cover the area with a thin layer of petroleum jelly, such as Vaseline, and a nonstick bandage.  Apply more petroleum jelly, and replace the bandage as needed.  Avoid using an antibiotic ointment unless your doctor recommends it.  How can you help prevent it?  To help prevent getting another actinic keratosis:  Always wear sunscreen on exposed skin. Make sure to use a broad-spectrum sunscreen that has a sun protection factor (SPF) of 30 or higher. Use it every day, even when it is cloudy.  Wear long sleeves, a hat, and pants if you are going to be outdoors for a long time.  Avoid the sun between 10 a.m. and 4 p.m., the peak time for UV rays.  Do not use tanning booths or sunlamps.  When should you call for help?  Watch closely for changes in your health, and be sure to contact your doctor if:    You have symptoms of infection, such as:  Increased pain, swelling, warmth, or redness.  Red streaks leading from the area.  Pus draining from the area.  A fever.     You see a change in your skin, such as a spot, growth, or mole that:  Grows bigger. This may happen slowly.  Changes color.  Changes shape.  Starts to bleed easily.     You have a wound that does not heal.   Where can you learn more?  Go to [facility] at [email or web address] in the Menu. Enter L364 in the search box to learn more about Actinic Keratosis: Care Instructions.  Current as of: March 09, 2022  Content Version: 14.3  ?? 2024 [place], [place].   Care instructions adapted under license by [facility]. If you have questions about a medical condition or this instruction, always ask your healthcare professional. Laren Player, [facility], disclaims any warranty or liability for your use of this information.

## 2023-08-14 NOTE — Unmapped (Signed)
 DERMATOLOGY CLINIC NOTE    ASSESSMENT AND PLAN:     Actinic Keratoses:   - After R/B/A discussed (including scarring, pigment alteration, recurrence, or persistence of the lesion) and verbal consent was obtained, identified  lesions were treated with cryotherapy x 1 ten second freeze-thaw cycle. The patient tolerated the procedure well and was instructed on post-cryotherapy wound care.   Total AK's frozen:> 15  Location and number: Face, ears, back    Benign lesions, waist up (seborrheic keratoses, nevi, lentigines):  -Reassurance provided.  -Discussed good photoprotective practices.  Recommended sunscreen with SPF.  -Discussed make us  aware should lesions grow or change significantly and also discussed other worrisome findings such as bleeding.     Return to clinic: 6 months to recheck actinic keratoses because numerous frozen today.       CHIEF COMPLAINT:  Lesion of concern on face     HPI:   This is a pleasant 73 y.o. Christopher [name] who last saw me in 2021.  At that time he was seen for actinic keratoses.  He also had pruritus of the scrotum at that time that we were trying to treat with gabapentin .  He has a number of comorbidities in terms of his overall health including diabetes and coronary artery disease.  He had a benign biopsy of his inguinal fold in 2021.    He is here today for a spot on his forehead.  A referral was placed on 01/10/2023 by Dr. Felipe Horton.     PAST MEDICAL HISTORY:  History of 5 different operations in lumbar spine, most recently in 2005  No known history of skin cancer     Hypertension  Hyperlipidemia  Obesity  Depression  Coronary artery disease  Type 2 diabetes     MEDICATIONS:   Current Outpatient Medications on File Prior to Visit   Medication Sig Dispense Refill    amlodipine  (NORVASC ) 10 MG tablet TAKE 1 TABLET (10 MG TOTAL) BY MOUTH DAILY. FOR HIGH BLOOD PRESSURE 90 tablet 3    aspirin  (ECOTRIN) 81 MG tablet Take 1 tablet (81 mg total) by mouth daily.      blood glucose control, low (TRUE METRIX LEVEL 1) Soln USE FOR GLUCOMETER MACHINE AS DIRECTED 1 each 3    blood-glucose meter kit Use as instructed. 1 each 0    diclofenac  sodium (VOLTAREN  ARTHRITIS PAIN) 1 % gel USE TRANSDERMAL 4 TIMES DAILY AS NEEDED      evolocumab  (REPATHA  SURECLICK) 140 mg/mL PnIj Inject the contents of one pen (140 mg) under the skin every fourteen (14) days. 6 mL 3    fluticasone  propionate (FLONASE ) Christopher mcg/actuation nasal spray 2 sprays into each nostril daily. 48 mL 3    gabapentin  (NEURONTIN ) 300 MG capsule Take 2 capsules (600 mg total) by mouth two (2) times a day. 180 capsule 3    JARDIANCE  10 mg tablet TAKE ONE TABLET BY MOUTH DAILY 90 tablet 2    metFORMIN  (GLUCOPHAGE ) 1000 MG tablet Take 1 tablet by mouth in the morning and 1 tablet in the evening with meals. 180 tablet 1    metoPROLOL  tartrate (LOPRESSOR ) 25 MG tablet TAKE 1 TABLET (25 MG TOTAL) BY MOUTH TWO (2) TIMES A DAY. 180 tablet 3    omeprazole  (PRILOSEC) 20 MG capsule TAKE 1 CAPSULE (20 MG TOTAL) BY MOUTH DAILY. 90 capsule 3    sertraline  (ZOLOFT ) 100 MG tablet Take 1 tablet (100 mg total) by mouth daily. 90 tablet 1    tamsulosin  (FLOMAX ) 0.4  mg capsule TAKE 1 CAPSULE EVERY DAY 90 capsule 3    traZODone  (DESYREL ) 150 MG tablet Take 1 tablet (150 mg total) by mouth nightly. 90 tablet 3    trospium  (SANCTURA ) 20 mg tablet Take 1 tablet (20 mg total) by mouth nightly. 90 tablet 3    TRUE METRIX GLUCOSE TEST STRIP Strp CHECK BLOOD SUGAR AS DIRECTED ONCE A DAY AND FOR SYMPTOMS OF HIGH OR LOW BLOOD SUGAR. 100 strip 3    TRUEPLUS LANCETS 33 gauge Misc TEST BLOOD SUGAR EVERY DAY 100 each 3     No current facility-administered medications on file prior to visit.       ALLERGIES:   Morphine  Atorvastatin    SOCIAL HISTORY:  Lives in [place]  Works in farming     REVIEW OF SYSTEMS:  Baseline state of health. No recent illnesses. No other skin complaints.     PHYSICAL EXAMINATION:  Examination in the presence of chaperone:  General: Well-developed, well-nourished. No acute distress.   Neuro: Alert and oriented, answers questions appropriately.  Skin: Waist up examination of the scalp, face, head, neck, chest, back, upper extremities, hands, palms was performed and notable for the following:  Numerous thin scaly macules on face, ears, forehead, back  Scattered lentigines light brown spots  Scattered stuck on seborrheic keratoses       Dictation software was used while making this note. Please excuse any errors made with dictation software.

## 2023-09-05 ENCOUNTER — Ambulatory Visit: Admit: 2023-09-05 | Discharge: 2023-09-06 | Payer: Medicare (Managed Care)

## 2023-09-05 DIAGNOSIS — M19012 Primary osteoarthritis, left shoulder: Principal | ICD-10-CM

## 2023-09-05 DIAGNOSIS — Z96612 Presence of left artificial shoulder joint: Principal | ICD-10-CM

## 2023-09-05 MED ADMIN — triamcinolone acetonide (KENALOG-40) injection 20 mg: 20 mg | INTRA_ARTICULAR | @ 17:00:00 | Stop: 2023-09-05

## 2023-09-05 NOTE — Unmapped (Signed)
 SPORTS MEDICINE RETURN VISIT    ASSESSMENT AND PLAN      Diagnosis ICD-10-CM Associated Orders   1. Osteoarthritis of left AC (acromioclavicular) joint  M19.012 Med Joint Inj: L acromioclavicular      2. S/P reverse total shoulder arthroplasty, left  Z96.612            Assessment & Plan  Status post left reverse shoulder arthroplasty with likely AC joint osteoarthritis  Chronic pain likely from [facility] joint. Injection offered as initial treatment and for diagnostic purposes.  Discussed with Dr. Azalea Lento prior to injection to ensure that he would be okay with [facility] joint injection. R/B/A were discussed with patient and he elected to proceed.  - Administer injection into [facility] joint of right shoulder.  - I discussed that if his pain does not resolve after 2 weeks he should schedule follow-up visit and at that point I would recommend that we perform aspiration with synovial fluid analysis of the glenohumeral joint to rule out infection given that he does have fluid within the sulcus, although that is quite common in reverse shoulder arthroplasty.    Diabetes mellitus  Improved glycemic control with A1c reduced from 10 to 8, safe for shoulder injection.  - Continue current diabetes management regimen.  - Monitor blood glucose levels regularly.    No follow-ups on file.    Procedure(s):  Consent - left acromioclavicular joint injection  After discussing the various treatment options for the condition, it was agreed that corticosteroid injection would be the next step in treatment. The nature of and the indications for injection of corticosteroid and local anaesthetic were reviewed in detail with the patient today. The inherent risks of injection, including infection, bleeding, allergic reaction, increased pain, incomplete relief or temporary relief of symptoms, alterations of blood glucose levels requiring careful monitoring and treatment as indicated, tendon, ligament or articular cartilage rupture or degeneration, nerve injury, skin depigmentation, and/or fatty atrophy were discussed. The patient expressed understanding and consented to proceed.     Procedure   After the risks and benefits of the procedure were explained, verbal consent was given, and a procedural time-out was performed. The shoulder was palpated and the correct anatomical landmarks were used to identify the acromioclavicular joint. The acromioclavicular joint and surrounding structures were then visualized with ultrasound. The site for the injection was properly marked and prepped with chlorhexidine  solution.  Using ultrasound guidance, the acromioclavicular joint was visualized and injected with 10 milligrams of Kenalog  (triamcinolone  acetonide),1 milliliters of 1% plain lidocaine  using a sterile technique and a 25 gauge 1.5 inch needle. During the injection, there was unrestricted flow and care was taken not to inject corticosteroid into the skin or subcutaneous tissues. There were no complications during the procedure.    Post-procedure   A sterile adhesive bandage was applied.    Discharge  Instructions were given regarding post-injection care, when to follow up in clinic and what to expect from the procedures. The patient tolerated the procedures well and was discharged without immediate complication.    NEED FOR SONOGRAPHIC GUIDANCE  Given the complexity of this problem and the anatomic location of this structure, sonographic guidance is recommended to prevent injury to neurovascular structures and confirm accuracy of injection. The accuracy of doing these injections blind is poor and the benefit to the patient by using ultrasound guidance is significant to avoid complications.     Reference:  American Medical Society for Sports Medicine (AMSSM) position statement: interventional musculoskeletal ultrasound in sports medicine.  Maretta Shaper MM, Adams E, Berkoff D, Concoff AL, Bland Bunnell J Sports Med. 2014 Oct 20. pii: [id number]. doi: [id number]        SUBJECTIVE     DATE OF PROCEDURE: 11/12/2018     PROCEDURE PERFORMED: Left reverse shoulder arthroplasty    History of Present Illness  Christopher [name] is a 73 year old male with diabetes who presents with left shoulder pain.    He has been experiencing shoulder pain for approximately six months. Initially minor, the pain has progressively worsened over time. It is exacerbated by pressure, such as when lifting objects while seated, and is localized to the shoulder, radiating across the area.    He experiences difficulty with certain movements, such as reaching across his body and lifting his arm behind his back, which causes discomfort. The pain is particularly pronounced during these actions, and he describes it as severe when trying to pick up anything while seated.    He has a history of diabetes, with a recent A1c reported to be 8, improved from a previous level of 10. This improvement is attributed to a new medication regimen and significant dietary changes, including reducing his intake of sweets.    Past Medical History:   Past Medical History:   Diagnosis Date    Acute kidney injury 01/12/2018    Anemia     IRON DEFICIENT     Anxiety dont know    Arthritis     Basal cell carcinoma     Cataract     CHF (congestive heart failure)       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    Corneal abrasion 1975    steel in right eye with abrasion and removal    Cubital tunnel syndrome on left 01/16/2019    Depression 2016    Diabetes mellitus       GERD (gastroesophageal reflux disease) 1982    Hypertension     Meningitis (HHS-HCC) 12/25/2015    Mixed hyperlipidemia     Myocardial infarction   1986    Obesity 1986    first back surgery    Peptic ulceration      OBJECTIVE     Physical Exam:  Vitals:   Wt Readings from Last 3 Encounters:   06/28/23 78.5 kg (173 lb)   05/17/23 82.1 kg (181 lb)   02/28/23 86.6 kg (191 lb)     Estimated body mass index is 28.79 kg/m?? as calculated from the following:    Height as of 06/28/23: 165.1 cm (5' 5).    Weight as of 06/28/23: 78.5 kg (173 lb).  Gen: Well-appearing male in no acute distress  MSK: Left shoulder  Well-healed deltopectoral incision  Forward elevation 150  External rotation 40  Internal rotation back pocket  4/5 supraspinatus/infraspinatus/subscapularis  Tenderness to palpation at the [facility] joint  Pain with cross body adduction    Imaging/other tests: X-ray left shoulder:/05/2023 demonstrates reverse total shoulder arthroplasty.  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  The stem is well-seated without evidence of lucency, but there is some mild gapping seen on the axillary view    Lab Results   Component Value Date    A1C 8.0 (H) 06/28/2023         ADMINISTRATIVE     I have personally reviewed and interpreted the images (as available).  Point-of-care ultrasound imaging is on file and stored  in a permanent location (if performed).  I have personally reviewed prior records and incorporated relevant information above (as available).    Note written with [employer].      MEDICAL DECISION MAKING (level of service defined by 2/3 elements)     Number/Complexity of Problems Addressed 1 undiagnosed new problem with uncertain prognosis (99204/99214)   Amount/Complexity of Data to be Reviewed/Analyzed Independent interpretation of a test performed by another physician/other qualified health care professional (99204/99214)   Risk of Complications/Morbidity/Mortality of Management Decision for MINOR Surgery (Including Injection) WITH Risk Factors (99204/99214)   Risk: DM2    MODIFIER 25 (Significant, Separately Billable Evaluation and Management)     Documentation to ensure appropriate insurance payment for medically necessary work    Per the [employer] (rev. 04/24/2021) Chapter 13, Section B. Evaluation & Management (E&M) Services, paragraph 5:   ???In general, E&M services on the same date of service as the minor surgical procedure are included in the payment for the procedure???However, a significant and separately identifiable E&M service unrelated to the decision to perform the minor surgical procedure is separately reportable with modifier 25. The E&M service and minor surgical procedure do not require different diagnoses.???    Per the American Medical Association in Reporting CPT Modifier 25 [CPT??Geophysicist/field seismologist (Online). 2023;33(11):1-12.] Page 1, Appropriate Use, paragraph 1:   ???Modifier 25 is used to indicate that a patient's condition required a significant, separately identifiable evaluation and management (E/M) service above and beyond that associated with another procedure or service being reported by the same physician or other qualified health care professional [facility]) on the same date. This service must be above and beyond the other service provided or beyond the usual preoperative and postoperative care associated with the procedure or service that was performed on that same date, and it must be substantiated by documentation in the patient's record that satisfies the relevant criteria for the respective E/M service to be reported.???    Per the American Medical Association in Reporting CPT Modifier 25 [CPT??Geophysicist/field seismologist (Online). 2023;33(11):1-12.] Page 2, Considerations, bullet point 2 (Requires awareness of usual preoperative and postoperative services):   ???Pre- and post-operative services typically associated with a procedure include the following and cannot be reported with a separate E/M services code:   Review of patient's relevant past medical history,  Assessment of the problem area to be treated by surgical or other service,  Formulation and explanation of the clinical diagnosis,  Review and explanation of the procedure to the patient, family, or caregiver,  Discussion of alternative treatments or diagnostic options,  Obtaining informed consent,  Providing postoperative care instructions,  Discussion of any further treatment and follow up after the procedure???    As the service provider for this encounter, I attest that the patient's condition required a significant, separately identifiable, medical necessary evaluation and management service in addition to the procedure performed on the same date of service. The evaluation and management service was above and beyond the usual preoperative and postoperative care associated with the procedure. The specific elements of the encounter that represent evaluation and management service above and beyond the usual preoperative and postoperative care associated with the procedure include, but are not limited to:  Reviewed the patient's relevant past surgical history, social history and family history  Reviewed the patient's medications  Reviewed the patient's allergies  Independently obtained history of present illness and relevant review of systems  Independently reviewed laboratory, imaging and/or other  data  Independently interpreted laboratory, imaging and/or other data  Independently synthesized history, physical examination, laboratory, imaging and/or other data to formulate a management plan including elements separate from the procedure itself and usual postprocedural care       PROCEDURES     Med Joint Inj: L acromioclavicular on 09/05/2023 1:10 PM  Details: 25 G needle, ultrasound-guided superior approach  Laterality: left  Location: shoulder  Medications: 20 mg triamcinolone  acetonide 40 mg/mL           DME     DME ORDER:  Dx:  ,

## 2023-09-06 ENCOUNTER — Ambulatory Visit
Admit: 2023-09-06 | Discharge: 2023-09-07 | Payer: Medicare (Managed Care) | Attending: Internal Medicine | Primary: Internal Medicine

## 2023-09-06 DIAGNOSIS — I259 Chronic ischemic heart disease, unspecified: Principal | ICD-10-CM

## 2023-09-06 DIAGNOSIS — E1122 Type 2 diabetes mellitus with diabetic chronic kidney disease: Principal | ICD-10-CM

## 2023-09-06 DIAGNOSIS — I152 Hypertension secondary to endocrine disorders: Principal | ICD-10-CM

## 2023-09-06 DIAGNOSIS — T466X5A Adverse effect of antihyperlipidemic and antiarteriosclerotic drugs, initial encounter: Principal | ICD-10-CM

## 2023-09-06 DIAGNOSIS — E1169 Type 2 diabetes mellitus with other specified complication: Principal | ICD-10-CM

## 2023-09-06 DIAGNOSIS — M791 Myalgia, unspecified site: Principal | ICD-10-CM

## 2023-09-06 DIAGNOSIS — E785 Hyperlipidemia, unspecified: Principal | ICD-10-CM

## 2023-09-06 DIAGNOSIS — I240 Acute coronary thrombosis not resulting in myocardial infarction: Principal | ICD-10-CM

## 2023-09-06 DIAGNOSIS — N182 Chronic kidney disease, stage 2 (mild): Principal | ICD-10-CM

## 2023-09-06 DIAGNOSIS — Z951 Presence of aortocoronary bypass graft: Principal | ICD-10-CM

## 2023-09-06 DIAGNOSIS — Z789 Other specified health status: Principal | ICD-10-CM

## 2023-09-06 DIAGNOSIS — E1159 Type 2 diabetes mellitus with other circulatory complications: Principal | ICD-10-CM

## 2023-09-06 NOTE — Unmapped (Addendum)
 Today we discussed:  No concerns from me - heart conditions are well controlled and stable!  Keep up the great work.    Recommendations:  No medication changes needed  ECG today looks good    Follow-up:  Return in about 9 months (around 06/08/2024).    Thank you for the opportunity to participate in your health care. You may receive a survey about your recent visit via MyChart, text message, or mail.  Please take a moment to complete this survey.  We take your feedback very seriously and will use it to guide our continuous improvement efforts.    Terrell Ferris, MD, PhD  [facility]  Cardiology

## 2023-09-06 NOTE — Unmapped (Signed)
 [facility] CARDIOLOGY    [facility]  [address]  [place], [place] [address]  Ph: [phone]  Fax: [phone] [facility]  [address]  [place], [place] [address]   Ph: [phone]  Fax: [phone]     Date of Service: 09/06/2023    PCP: Lucilla Saa, MD  Referring Provider: Terrell Ferris, MD    ASSESSMENT and PLAN:     Christopher [name] is a 73 y.o. male presenting at the request of Terrell Ferris, MD for evaluation and management of:  1. Ischemic heart disease due to coronary artery obstruction      2. S/P CABG (coronary artery bypass graft)    3. Hypertension associated with type 2 diabetes mellitus      4. Hyperlipidemia associated with type 2 diabetes mellitus      5. Type 2 diabetes mellitus with stage 2 chronic kidney disease, without long-term current use of insulin       6. Statin intolerance    7. Myalgia due to statin      Christopher [name] presents in follow up. We last saw him 02/2023. He is doing very well. No concerning symptoms. Medications reviewed - compliant with no AE's. He has worked on reducing sugar intake and has lost 10 lbs. Trying to stay active. ECG today with no sig changes from priors. BP controlled. Exam unremarkable. We will follow up in 9 months for longitudinal management of chronic CVD.    1. Ischemic heart disease s/p PCI to LAD/D1 2004 with new angina 12/2021 => LHC showed 90% ostial LAD. Referred for CABG evaluation. S/p CABG 2V 02/2022 LIMA to LAD, RSVG to OM.  - aspirin  81mg  daily monotherapy  - on amlodipine , metoprolol  *stick with tartrate BID, jardiance   - last echo: 02/2022 normal EF  - stress/angio: see below  - low dose statin, +praluent  => repatha  (insurance)    2. HTN,   - continue amlodipine , metoprolol     3. TIA, presented 05/2020 to [facility] ER with dysarthria and right sided weakness. Symptoms now resolved. Carotid US  2023 - no sig plaque  - aggressive RF modification (BP, HLD, DM)  - anti-platelets as above  - ziopatch no afib, 4% PVCs    4. Diabetes, not on insulin . Good control previously.   - jardiance    Lab Results   Component Value Date    A1C 8.0 (H) 06/28/2023     5. ED  - prescribed viagra  with instructions for use re: nitroglycerin  sl (hasn't needed in 2+ years)    6. PMR  - managed by his PCP. On prednisone     Follow-up: Return in about 9 months (around 06/08/2024).    Terrell Ferris, MD, PhD  [facility]      ORDERS THIS VISIT:     Orders Placed This Encounter   Procedures    ECG 12 lead      New Prescriptions    No medications on file      Modified Medications    No medications on file      Discontinued Medications    No medications on file        SUBJECTIVE:     ID: Christopher [name] is a 73 y.o. male with a history of HTN, DM, CAD s/p PCI    Reason for Visit: Coronary Artery Disease     Cardiovascular History:    History of PCI in 2004. Presented to [facility] ER 06/10/20  with dysarthria and right sided weakness. Diagnosed with TIA. Carotid U/S showing mild carotid disease.     04/2021 referred to [facility] ER for severe episode of chest pain with subsequent new fatigue/SOB. Troponin negative x2.    9-01/2022 accelerating angina. LHC showed 90% ostial LAD with patent proximal LAD stent. Referred for CABG evaluation.     02/2022 [place]. CABG 2V    04/2022 diagnosed with PMR    Interval History:    No concerns. No chest pain. No nitroglycerin .    Able to get repatha .     Has cut out sugar.     Review of Systems  10 systems were reviewed and negative except as noted in HPI.    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    Prior Cardiovascular Interventions / Surgery:  NSTEMI in 2004 s/p PCI to LAD/D1    CABG  LIMA to LAD, RSVG to OM.  03/20/22     Prior Cardiovascular Diagnostics:    ECG   05/11/21 sinus rhythm, PRWP ?anterior infarct new from last tracing 05/2020  01/31/22 sinus rhythm, PRWP ?lead placement, NS TWA new from prior  02/21/22 sinus rhythm, NS TWA, PRWP  09/06/23 sinus rhythm, PRWP, NS TWA    Ziopatch 06/10/20 - 4% PVC burden. Otherwise, unremarkable 14 day ambulatory monitor.     Echo 05/2021, normal EF  Echo 01/2022 EF 50-55%, normal RV  Echo 02/2022 normal EF, post CABG    SPECT [facility] 2017  - Low risk study  - There is a small in size, mild in severity, partially reversible defect involving the mid inferior and mid inferolateral segments. This is consistent with probable artifact (diaphragmatic attenuation) and cannot rule out mild ischemia.  - Post stress: Global systolic function is low normal. The ejection fraction calculated at 51%.     Carotid U/S 05/2020  1. Small amount of atherosclerotic plaque at the carotid bulbs  bilaterally.  2. No hemodynamically significant stenosis of the extracranial  portions of the internal carotid arteries.  3. Antegrade flow within the vertebral arteries.    LHC 01/2022  - 90% ostial LAD stenosis  - Moderate diffuse CAD  - Patent stent in proximal LAD     Other Surgical History:   has a past surgical history that includes Hernia repair; stent; Foreign Body Removal (Right, about 20 years ago); Cardiac catheterization; Coronary stent placement; Back surgery; Spine surgery (1986/1995/2000/2002); Cardiac surgery (2005); pr repair peroneal tendons,fib osteotmy (Left, 07/20/2017); pr osteotomy heel bone (Left, 07/20/2017); pr osteotomy 1st metatarsal,base/shaft (Left, 07/20/2017); Skin biopsy; pr osteotomy metatarsal (not 1st) (Left, 06/06/2018); pr part remv phalanx of toe (Left, 06/06/2018); pr reconstr total shoulder implant (Left, 11/12/2018); pr removal implant deep (Left, 11/12/2018); pr repair biceps long tendon (Left, 11/12/2018); pr revise median n/carpal tunnel surg (Left, 02/17/2019); pr arm/elbow tendon lengthen,single,ea (Left, 02/17/2019); pr revise ulnar nerve at elbow (Left, 02/17/2019); Joint replacement (Left); Shoulder surgery (Bilateral); pr cath place/coron angio, img super/interp,w left heart ventriculography (N/A, 02/01/2022);  pr cabg, arterial, single (Midline, 03/20/2022); pr endoscopy w/video-asst vein harvest,cabg (Right, 03/20/2022); and pr cabg, artery-vein, single (Midline, 03/20/2022).    Current Outpatient Medications   Medication Instructions    amlodipine  (NORVASC ) 10 mg, Oral, Daily (standard), For high blood pressure    aspirin  (ECOTRIN) 81 mg, Oral, Daily (standard)    blood glucose control, low (TRUE METRIX LEVEL 1) Soln USE FOR GLUCOMETER MACHINE AS DIRECTED    blood-glucose meter kit Use as instructed.    evolocumab  (REPATHA  SURECLICK) 140 mg/mL PnIj Inject the contents of one pen (140 mg) under the skin every fourteen (14) days.    fluticasone  propionate (FLONASE ) 50 mcg/actuation nasal spray 2 sprays, Each Nare, Daily (standard)    gabapentin  (NEURONTIN ) 600 mg, Oral, 2 times a day (standard)    JARDIANCE  10 mg, Oral, Daily (standard)    metFORMIN  (GLUCOPHAGE ) 1000 MG tablet Take 1 tablet by mouth in the morning and 1 tablet in the evening with meals.    metoPROLOL  tartrate (LOPRESSOR ) 25 mg, Oral, 2 times a day (standard)    omeprazole  (PRILOSEC) 20 mg, Oral, Daily (standard)    sertraline  (ZOLOFT ) 100 mg, Oral, Daily (standard)    tamsulosin  (FLOMAX ) 0.4 mg, Oral, Daily (standard)    traZODone  (DESYREL ) 150 mg, Oral, Nightly    trospium  (SANCTURA ) 20 mg, Oral, Nightly    TRUE METRIX GLUCOSE TEST STRIP Strp CHECK BLOOD SUGAR AS DIRECTED ONCE A DAY AND FOR SYMPTOMS OF HIGH OR LOW BLOOD SUGAR.    TRUEPLUS LANCETS 33 gauge Misc TEST BLOOD SUGAR EVERY DAY        Allergies:  is allergic to morphine, atorvastatin, atorvastatin calcium , baclofen, ibuprofen, pneumococcal 23-valent polysaccharide vaccine, and zetia [ezetimibe].    Social History:  He  reports that he has never smoked. He has never been exposed to tobacco smoke. He has never used smokeless tobacco. He reports that he does not currently use drugs. He reports that he does not drink alcohol.    Family History:  His family history includes Alcohol abuse in his father; COPD in his brother, brother, and sister; Cancer in his father and maternal grandmother; Cataracts in his father, mother, and paternal grandfather; Diabetes in his brother, father, mother, sister, and sister; Early death in his brother; Hypertension in his father, maternal grandfather, maternal grandmother, mother, paternal grandfather, and paternal grandmother; Kidney disease in his maternal aunt; Melanoma in his father; No Known Problems in his maternal uncle, paternal aunt, paternal uncle, and another family member; Stroke in his mother.       OBJECTIVE:      BP 122/80 (BP Site: R Arm, BP Position: Sitting, BP Cuff Size: Large)  - Pulse 67  - Temp 36.4 ??C (97.6 ??F) (Temporal)  - Resp 18  - Ht 165.1 cm (5' 5)  - Wt 76.7 kg (169 lb)  - SpO2 95%  - BMI 28.12 kg/m??    Wt Readings from Last 3 Encounters:   09/06/23 76.7 kg (169 lb)   06/28/23 78.5 kg (173 lb)   05/17/23 82.1 kg (181 lb)     BP Readings from Last 5 Encounters:   09/06/23 122/80   06/28/23 122/72   05/17/23 135/80   02/28/23 122/84   02/27/23 128/78     Pulse Readings from Last 3 Encounters:   09/06/23 67   06/28/23 66   05/17/23 69       General:  Alert, no distress.   HEENT:  EOMI, sclerae anicteric   Neck: No  carotid bruit. JVP is not visible sitting upright   Lungs:   CTAB bilaterally with normal WOB.   CV:  RRR, normal s1 and s2, no murmurs, normal radial pulses b/l Abdomen:   Soft, non-tender   Extremities: No LE edema   Skin: No lesions/rashes.   Neurologic: No focal deficits.     Lab Results   Component Value Date    HGB 13.6 05/17/2023    HGB 11.9 (L) 02/28/2023    HGB 12.3 (L) 04/28/2022    PLT 216 05/17/2023    PLT 139 (L) 02/28/2023    PLT 225 04/28/2022     Lab Results   Component Value Date    CREATININE 1.08 05/17/2023    CREATININE 0.90 02/28/2023    CREATININE 0.95 10/27/2022    K 4.1 05/17/2023    K 4.0 02/28/2023    K 3.8 10/27/2022      No results found for: BNP  Lab Results   Component Value Date    PROBNP 155.0 01/11/2018     Lab Results   Component Value Date    CHOL 181 05/17/2023    CHOL 147 10/27/2022    LDL 63 05/17/2023    LDL 43 10/27/2022    HDL 58 05/17/2023    HDL 54 10/27/2022    TRIG 161 (H) 05/17/2023    TRIG 249 (H) 10/27/2022     Lab Results   Component Value Date    A1C 8.0 (H) 06/28/2023

## 2023-09-12 MED ORDER — TRAZODONE 150 MG TABLET
ORAL_TABLET | Freq: Every evening | ORAL | 3 refills | 0.00000 days
Start: 2023-09-12 — End: ?

## 2023-09-17 MED ORDER — TRAZODONE 150 MG TABLET
ORAL_TABLET | Freq: Every evening | ORAL | 3 refills | 90.00000 days
Start: 2023-09-17 — End: ?

## 2023-09-18 MED ORDER — TRAZODONE 150 MG TABLET
ORAL_TABLET | Freq: Every evening | ORAL | 3 refills | 90.00000 days | Status: CP
Start: 2023-09-18 — End: ?

## 2023-09-18 NOTE — Unmapped (Signed)
 Copied from CRM #[id number]. Topic: Access To Clinicians - Medication Refill  >> Sep 18, 2023 11:41 AM Lisette Ridgel wrote:  The PAC has received an incoming call requesting medication refill/clarification/question:    Caller: Malcolm Scrivener.  Best callback number: [phone]    Type of request (refill, clarification, question): refill    Name of medication:       Is there a preferred amount requested (e.g. 30 pills, 3 months worth - if no leave blank):     Desired pharmacy:   [facility] - [place], [place] - [address]  [address]  [place] [address]  Phone: [phone] Fax: [phone]    Does caller request a callback from a nurse to discuss?: no

## 2023-09-25 ENCOUNTER — Ambulatory Visit
Admit: 2023-09-25 | Discharge: 2023-09-25 | Payer: Medicare (Managed Care) | Attending: Adult Health | Primary: Adult Health

## 2023-09-25 ENCOUNTER — Ambulatory Visit: Admit: 2023-09-25 | Discharge: 2023-09-25 | Payer: Medicare (Managed Care)

## 2023-09-25 DIAGNOSIS — N3943 Post-void dribbling: Principal | ICD-10-CM

## 2023-09-25 DIAGNOSIS — N3941 Urge incontinence: Principal | ICD-10-CM

## 2023-09-25 DIAGNOSIS — N401 Enlarged prostate with lower urinary tract symptoms: Principal | ICD-10-CM

## 2023-09-25 DIAGNOSIS — R972 Elevated prostate specific antigen [PSA]: Principal | ICD-10-CM

## 2023-09-25 LAB — PSA: PROSTATE SPECIFIC ANTIGEN: 0.45 ng/mL (ref 0.00–4.00)

## 2023-09-25 MED ORDER — TAMSULOSIN 0.4 MG CAPSULE
ORAL_CAPSULE | Freq: Every day | ORAL | 3 refills | 90.00000 days | Status: CP
Start: 2023-09-25 — End: ?

## 2023-09-25 MED ORDER — TROSPIUM 20 MG TABLET
ORAL_TABLET | Freq: Two times a day (BID) | ORAL | 3 refills | 90.00000 days | Status: CP
Start: 2023-09-25 — End: 2024-09-19

## 2023-09-25 NOTE — Unmapped (Signed)
 Assessment:   Diagnosis ICD-10-CM Associated Orders   1. Other urinary incontinence / bph/ elevated psa N39.498 Urology   This is a 73 y.o. male with BPH, urge incontinence, previously worse at night, now more bothersome during the day, prior good response to vesicare, however, it contributed to dry mouth. Myrbetriq  wasn't covered by insurance.       Prior elevated psa in the setting of UTI.     Plan:  - Increase sanctura  to twice daily for incontinence. (reviewed side effects including dry mouth, constipation, urinary retention, confusion). He's going to stop the medication and contact us  should he develop any of these symptoms. Understands to go to the ED if he cannot urinate over a 6 hour period.   - continue flomax  medication  - psa today  (0.38 08/21/22)   - plan follow up in three months     Referring Provider:  Dr. Felipe [name]    PCP:   Dr. Randal [name]    Reason for Visit:  Incontinence    HPI:   [name]  is a pleasant 73 y.o. year-old male who is being seen today to follow up on incontinence.      Interval 75/3/25:   He returns today doing well on sanctura / flomax . He notes he's sleeping well. Better than he's ever slept. Great stream. States that he's now leaking with urge which has been worsening over the last year. He is now wearing a male pad; having to change every 4 hours.  No hematuria/ dysuria. No nocturia, doesn't leak during the day he states.     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 No recurrence of UTI.       PVR 1 mls  UA 2 + glucose      Interval 08/21/22:   He returns today doing well on sanctura  flomax . He notes he's sleeping well. Better than he's ever slept. Great stream. No hematuria/ dysuria. Only mild urgency.     He did have open heart surgery last November. 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 No recurrence of UTI.     IPSS: 1/ QOL 0    PVR 0 mls       Interval 08/16/21:   He was previously prescribed sanctura  and flomax , but he's not sure if he's still taking these medications. Thinks he is currently.  He can sleep all night, without nocturia/ leakage, and is very pleased with this. Wears one pad throughout the day, but does have some urgency; this is unchanged.  Typically doesn't have accidents.  He does drink coffee/ pepsi, so likely these contribute to some urgency. He's also on spironolactone .     History of prior elevated psa in the setting of uti.     PVR 0 mls    Interval 04/23/20:  He is taking sanctura  and flomax  medication. He can sleep all night, without nocturia/ leakage, and is very pleased with this. Wears one pad throughout the day, but does have some urgency. Typically doesn't have accidents unless he waits too long to urinate. He does drink coffee/ pepsi, so likely these contribute to some urgency.     History of prior elevated psa in the setting of uti.     PVR 0 mls  UA trace protein/ ketones, 1+ bili     Regular bms.     Interval 02/2019:  He has started sanctura  in addition to his flomax  medication. He tells me everything is wonderful. Only requiring a security pad throughout the day he rarely has to change. Notes mild, but  manageable dry mouth that is worse in the morning. He's now sleeping through the night without nocturia or leakage. Very pleased with medication. No complaints.       Initial visit with me:   Essentially, his problems are unchanged. Notes that he has some urge incontinence which is worst at night. He is wearing a pad, and plastic sheet underneath him. No leakage during the day, but does wear a security pad in case. Feels like sometimes he doesn't empty his bladder.     Plan was in 2018 to switch to myrbetriq  25mg , given severe dry mouth with vesicare, however he believes that myrbetriq  wasn't covered by his insurance.     Caffeine: 1 cup coffee/ day     BM:  Daily     PVR 12   UA trace blood    IPSS    Incomplete Emptying: 4  Frequency: 4  Intermittency: 4  Urgency: 5  Weak stream: 3  Straining: 1  Nocturia: 2    Total: 24  (0-7 mild, 8-19 moderate, 20-35 severe)    QOL: 5          PSA 0.9 (2018). Does have history of one elevated psa level in the setting of UTI.     Cysto 2018:    Cystoscopy (male)  Time out was performed immediately prior to the procedure.     The patient was prepped and draped in the usual sterile fashion.  Flexible cystosopy was performed.  The urethra was normal.  Prostatic urethra showed normal prostate with very mild right lateral and anterior lobe enlargement, no median lobe, no significant obstruction noted.  The bladder urothelium was without abnormality.  The ureteral orifices were visualized bilaterally, and efflux was clear.  Saline barbotage was not performed for cytology.  The patient tolerated the procedure well and was given one dose of antibiotic prophylaxis afterwards.    Today, he reports that he is doing well. Today, he denies fever, chills, chest pain, shortness of breath, cough, wheezing, nausea, vomiting, abdominal pain, flank pain, diarrhea, constipation, dysuria, hematuria, rashes, open wounds, edema, and weakness or numbness in the upper or lower extremities.     Past medical history:  Past Medical History:   Diagnosis Date    Acute kidney injury 01/12/2018    Anemia     IRON DEFICIENT     Anxiety dont know    Arthritis     Basal cell carcinoma     Cataract     CHF (congestive heart failure)       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    Corneal abrasion 1975    steel in right eye with abrasion and removal    Cubital tunnel syndrome on left 01/16/2019    Depression 2016    Diabetes mellitus       GERD (gastroesophageal reflux disease) 1982    Hypertension     Meningitis 12/25/2015    Mixed hyperlipidemia     Myocardial infarction   1986    Obesity 1986    first back surgery    Peptic ulceration        Past surgical history:  Past Surgical History:   Procedure Laterality Date    BACK SURGERY      CARDIAC CATHETERIZATION      STENTS PLACEMENT     CARDIAC SURGERY  2005    stents    CORONARY STENT PLACEMENT      FOREIGN BODY REMOVAL Right about 20 years ago  HERNIA REPAIR      RIGHT INGUNIAL    JOINT REPLACEMENT Left     TOTAL SHOULDER    PR ARM/ELBOW TENDON LENGTHEN,SINGLE,EA Left 02/17/2019    Procedure: TENDON LENGTHENING UPPER ARM/ELBOW SNGL EA;  Surgeon: Adalberto Acton, MD;  Location: ASC OR [facility];  Service: Orthopedics    PR CABG, ARTERIAL, SINGLE Midline 03/20/2022    Procedure: CORONARY ARTERY BYPASS, USING ARTERIAL GRAFT(S); SINGLE ARTERIAL GRAFT;  Surgeon: Celina Colla, MD;  [facility]: MAIN OR [facility];  Service: Cardiac Surgery    PR CABG, ARTERY-VEIN, SINGLE Midline 03/20/2022    Procedure: CORONARY ARTERY BYPASS, USING VENOUS GRAFT(S) AND ARTERIAL GRAFT(S); SINGLE VEIN GRAFT;  Surgeon: Celina Colla, MD;  Location: MAIN [facility];  Service: Cardiac Surgery    PR CATH PLACE/CORON ANGIO, IMG SUPER/INTERP,W LEFT HEART VENTRICULOGRAPHY N/A 02/01/2022    Procedure: Left Heart Catheterization;  Surgeon: Jacquie Maudlin, MD;  Location: [facility] CATH;  Service: Cardiology    PR ENDOSCOPY W/VIDEO-ASST VEIN HARVEST,CABG Right 03/20/2022    Procedure: ENDOSCOPY, SURGICAL, INCLUDING VIDEO-ASSISTED HARVEST OF VEIN(S) FOR CORONARY ARTERY BYPASS PROCEDURE;  Surgeon: Celina Colla, MD;  Location: MAIN OR [facility];  Service: Cardiac Surgery    PR OSTEOTOMY 1ST METATARSAL,BASE/SHAFT Left 07/20/2017    Procedure: OSTEOTOMY, W/WO LENGTHENING, SHORTENING OR ANGULAR CORRECTION, METATARSAL; FIRST METATARSAL;  Surgeon: Roselie Conger, MD;  Location: ASC OR [facility];  Service: Ortho Foot & Ankle    PR OSTEOTOMY HEEL BONE Left 07/20/2017    Procedure: OSTEOTOMYKatharina Palin W/WO INT FIXA;  Surgeon: Roselie Conger, MD;  Location: ASC OR [facility];  Service: Ortho Foot & Ankle    PR OSTEOTOMY METATARSAL (NOT 1ST) Left 06/06/2018    Procedure: OSTEOTOMY, W/WO LENGTHENING, SHORTENING OR ANGULAR CORRECTION, METATARSAL; OTHER THAN FIRST METATARSAL, EA;  Surgeon: Roselie Conger, MD;  Location: ASC OR [facility];  Service: Orthopedics    PR PART REMV PHALANX OF TOE Left 06/06/2018    Procedure: PART EXC BONE PHALANX TOE;  Surgeon: Roselie Conger, MD;  Location: ASC [facility];  Service: Orthopedics    PR RECONSTR TOTAL SHOULDER IMPLANT Left 11/12/2018    Procedure: R28   ARTHROPLASTY, GLENOHUMERAL JOINT; TOTAL SHOULDER(GLENOID & PROXIMAL HUMERAL REPLACEMENT(EG, TOTAL SHOULDER);  Surgeon: Beau Bound, MD;  [facility];  Service: Orthopedics    PR REMOVAL IMPLANT DEEP Left 11/12/2018    Procedure: REMOVE IMPLANT; DEEP SHOULDER;  Surgeon: Beau Bound, MD;  Location: [facility] OR [facility];  Service: Orthopedics    PR REPAIR BICEPS LONG TENDON Left 11/12/2018    Procedure: TENODESIS LONG TENDON BICEPS;  Surgeon: Beau Bound, MD;  Location: [facility] OR [facility];  Service: Orthopedics    PR REPAIR PERONEAL TENDONS,FIB OSTEOTMY Left 07/20/2017    Procedure: REPR DISLOC PERONEAL TENDONS; W/FIB OSTEOTOMY;  Surgeon: Roselie Conger, MD;  Location: ASC OR [facility];  Service: Ortho Foot & Ankle    PR REVISE MEDIAN N/CARPAL TUNNEL SURG Left 02/17/2019    Procedure: R22 NEUROPLASTY AND/OR TRANSPOSITION; MEDIAN NERVE AT CARPAL TUNNEL;  Surgeon: Adalberto Acton, MD;  Location: ASC OR [facility];  Service: Orthopedics    PR REVISE ULNAR NERVE AT ELBOW Left 02/17/2019    Procedure: NEUROPLASTY AND/OR TRANSPOSITION; ULNAR NERVE AT ELBOW;  Surgeon: Adalberto Acton, MD;  Location: ASC OR [facility];  Service: Orthopedics    SHOULDER SURGERY Bilateral     L TSA, R ROTATOR CUFF      SKIN BIOPSY  SPINE SURGERY  1986/1995/2000/2002    LUMBAR FUSION    stent         Social history:  Social History     Social History Narrative    Not on file       Family history:  family history includes Alcohol abuse in his father; COPD in his brother, brother, and sister; Cancer in his father and maternal grandmother; Cataracts in his father, mother, and paternal grandfather; Diabetes in his brother, father, mother, sister, and sister; Early death in his brother; Hypertension in his father, maternal grandfather, maternal grandmother, mother, paternal grandfather, and paternal grandmother; Kidney disease in his maternal aunt; Melanoma in his father; No Known Problems in his maternal uncle, paternal aunt, paternal uncle, and another family member; Stroke in his mother.    MEDICATIONS:   Current Outpatient Medications   Medication Sig Dispense Refill    amlodipine  (NORVASC ) 10 MG tablet TAKE 1 TABLET (10 MG TOTAL) BY MOUTH DAILY. FOR HIGH BLOOD PRESSURE 90 tablet 3    aspirin  (ECOTRIN) 81 MG tablet Take 1 tablet (81 mg total) by mouth daily.      blood glucose control, low (TRUE METRIX LEVEL 1) Soln USE FOR GLUCOMETER MACHINE AS DIRECTED 1 each 3    blood-glucose meter kit Use as instructed. 1 each 0    evolocumab  (REPATHA  SURECLICK) 140 mg/mL PnIj Inject the contents of one pen (140 mg) under the skin every fourteen (14) days. 6 mL 3    fluticasone  propionate (FLONASE ) 50 mcg/actuation nasal spray 2 sprays into each nostril daily. 48 mL 3    gabapentin  (NEURONTIN ) 300 MG capsule Take 2 capsules (600 mg total) by mouth two (2) times a day. 180 capsule 3    JARDIANCE  10 mg tablet TAKE ONE TABLET BY MOUTH DAILY 90 tablet 2    metFORMIN  (GLUCOPHAGE ) 1000 MG tablet Take 1 tablet by mouth in the morning and 1 tablet in the evening with meals. 180 tablet 1    metoPROLOL  tartrate (LOPRESSOR ) 25 MG tablet TAKE 1 TABLET (25 MG TOTAL) BY MOUTH TWO (2) TIMES A DAY. 180 tablet 3    omeprazole  (PRILOSEC) 20 MG capsule TAKE 1 CAPSULE (20 MG TOTAL) BY MOUTH DAILY. 90 capsule 3    sertraline  (ZOLOFT ) 100 MG tablet Take 1 tablet (100 mg total) by mouth daily. 90 tablet 1    tamsulosin  (FLOMAX ) 0.4 mg capsule TAKE 1 CAPSULE EVERY DAY 90 capsule 3    traZODone  (DESYREL ) 150 MG tablet Take 1 tablet (150 mg total) by mouth nightly. 90 tablet 3    trospium  (SANCTURA ) 20 mg tablet Take 1 tablet (20 mg total) by mouth nightly. 90 tablet 3    TRUE METRIX GLUCOSE TEST STRIP Strp CHECK BLOOD SUGAR AS DIRECTED ONCE A DAY AND FOR SYMPTOMS OF HIGH OR LOW BLOOD SUGAR. 100 strip 3    TRUEPLUS LANCETS 33 gauge Misc TEST BLOOD SUGAR EVERY DAY 100 each 3     No current facility-administered medications for this visit.       Allergies:  Allergies   Allergen Reactions    Morphine Hives    Atorvastatin      neuropathy    Atorvastatin Calcium       neuropathy    Baclofen      headache  headache    Ibuprofen      GI bleed    Pneumococcal 23-Valent Polysaccharide Vaccine Other (See Comments)     Redness and pain at injection site. Fatigue  Pt stated his arm swelled within 24hrs after injection    Zetia [Ezetimibe] Other (See Comments)     Pt states it effected his brain  Pt stated it effected his sight, I couldn't drive a car at all       Review of Systems:  Positive for arthritis.  Otherwise, a 10 system ROS questionnaire completed by the patient and reviewed by me was normal.    Results for Christopher [name] (MRN [id number]) as of 04/05/2020 14:02   Ref. Range 01/09/2019 11:23   Prostate Specific Antigen Latest Ref Range: 0.00 - 4.00 ng/mL 0.47      Latest Reference Range & Units 05/18/20 09:42   Prostate Specific Antigen 0.00 - 4.00 ng/mL 0.51       Physical Exam  There were no vitals filed for this visit.    GENERAL: The patient is a pleasant, male in no acute distress.   HEENT: Normocephalic and atraumatic.   NECK: Supple with trachea midline.   LYMPHATICS: No cervical or supraclavicular lymphadenopathy.   PULMONARY: Relaxed respiratory effort on room air.   CARDIOVASCULAR: Regular rate   GASTROINTESTINAL: Soft, nontender and nondistended     SKIN: No signs of cyanosis or clubbing.   NEUROLOGICAL: Grossly intact.   PSYCH: Alert and oriented x 3.     GENITOURINARY2024 Circumcised penis with bilaterally descended testicles. No testicular masses or tenderness.  Digital rectal exam reveals a 30 g enlarged prostate that is firm, smooth and symmetric without nodules or tenderness.

## 2023-09-25 NOTE — Unmapped (Addendum)
-   increase sanctura  to twice daily (mark [place])   - continue flomax    - psa today

## 2023-10-02 MED FILL — REPATHA SURECLICK 140 MG/ML SUBCUTANEOUS PEN INJECTOR: SUBCUTANEOUS | 84 days supply | Qty: 6 | Fill #1

## 2023-10-10 ENCOUNTER — Ambulatory Visit: Admit: 2023-10-10 | Discharge: 2023-10-11 | Payer: Medicare (Managed Care)

## 2023-10-10 DIAGNOSIS — M25412 Effusion, left shoulder: Principal | ICD-10-CM

## 2023-10-10 DIAGNOSIS — Z96612 Presence of left artificial shoulder joint: Principal | ICD-10-CM

## 2023-10-10 NOTE — Unmapped (Signed)
 SPORTS MEDICINE POSTOPERATIVE VISIT    DATE OF PROCEDURE: 11/12/2018     PROCEDURE PERFORMED: Left reverse shoulder arthroplasty    ASSESSMENT      Quest Tavenner is a 73 y.o. male  s/p above    PLAN:    -- He did not get any relief from the acromioclavicular injections I think would be most appropriate to get the ultrasound aspiration of his glenohumeral joint to rule out an infection.  I spoke with Dr. Dawna, who was in a workmen at his [place] office prior to him going on vacation.  I will follow the aspiration.    --Imaging findings, further diagnostic and therapeutic options were reviewed with the patient, along with the benefits and drawbacks of each, and the patient expressed understanding    Prescriptions today: None    Follow-up: As above    X-rays at next visit:  None      SUBJECTIVE       History of Present Illness: 73 y.o. male who presents for follow-up of dilation of his left shoulder.  He received no relief from the acromioclavicular injection provided by Dr. Dawna.  He still has some anterior shoulder pain that goes along his entire length of the clavicle.  His function to his shoulder is still maintained.      Surgical Hx Past Surgical History[1]       OBJECTIVE     Physical Exam:    DETAILED PHYSICAL EXAM  General Appearance well-nourished and no acute distress   Vitals Estimated body mass index is 29.52 kg/m?? as calculated from the following:    Height as of 09/06/23: 165.1 cm (5' 5).    Weight as of 09/25/23: 80.5 kg (177 lb 6.4 oz).   Mood and Affect alert, cooperative, and pleasant       Cardiovascular well-perfused distally and no swelling     MUSCULOSKELETAL   LEFT:      SHOULDER:   Healed surgical incision  FE 160, ER 45, IR back pocket  Discomfort with cross arm adduction  No feelings of instability or subluxation  Discomfort along his clavicle as well as his coracoid region.  No warmth or fluctuance appreciated.         Imaging/other tests: Previous x-ray reviewed which reveals well-positioned reverse shoulder arthroplasty with no adverse features.    Orthopaedic PROMIS:  Failed to redirect to the Timeline version of the REVFS SmartLink.         No data to display                PRO Status:  Incomplete.                  [1]   Past Surgical History:  Procedure Laterality Date    BACK SURGERY      CARDIAC CATHETERIZATION      STENTS PLACEMENT     CARDIAC SURGERY  2005    stents    CORONARY STENT PLACEMENT      FOREIGN BODY REMOVAL Right about 20 years ago    HERNIA REPAIR      RIGHT INGUNIAL    JOINT REPLACEMENT Left     TOTAL SHOULDER    PR ARM/ELBOW TENDON LENGTHEN,SINGLE,EA Left 02/17/2019    Procedure: TENDON LENGTHENING UPPER ARM/ELBOW SNGL EA;  Surgeon: Delon Duwaine Terra Jakie, MD;  Location: ASC OR [facility];  Service: Orthopedics    PR CABG, ARTERIAL, SINGLE Midline 03/20/2022    Procedure: CORONARY ARTERY BYPASS,  USING ARTERIAL GRAFT(S); SINGLE ARTERIAL GRAFT;  Surgeon: Ranelle Debby Bare, MD;  Location: MAIN OR [facility];  Service: Cardiac Surgery    PR CABG, ARTERY-VEIN, SINGLE Midline 03/20/2022    Procedure: CORONARY ARTERY BYPASS, USING VENOUS GRAFT(S) AND ARTERIAL GRAFT(S); SINGLE VEIN GRAFT;  Surgeon: Ranelle Debby Bare, MD;  Location: MAIN OR [facility];  Service: Cardiac Surgery    PR CATH PLACE/CORON ANGIO, IMG SUPER/INTERP,W LEFT HEART VENTRICULOGRAPHY N/A 02/01/2022    Procedure: Left Heart Catheterization;  Surgeon: Pia Sharper, MD;  Location: [facility] CATH;  Service: Cardiology    PR ENDOSCOPY W/VIDEO-ASST VEIN HARVEST,CABG Right 03/20/2022    Procedure: ENDOSCOPY, SURGICAL, INCLUDING VIDEO-ASSISTED HARVEST OF VEIN(S) FOR CORONARY ARTERY BYPASS PROCEDURE;  Surgeon: Ranelle Debby Bare, MD;  Location: MAIN OR [facility];  Service: Cardiac Surgery    PR OSTEOTOMY 1ST METATARSAL,BASE/SHAFT Left 07/20/2017    Procedure: OSTEOTOMY, W/WO LENGTHENING, SHORTENING OR ANGULAR CORRECTION, METATARSAL; FIRST METATARSAL;  Surgeon: Fonda Rosalynn Cooler, MD; Location: ASC OR [facility];  Service: Ortho Foot & Ankle    PR OSTEOTOMY HEEL BONE Left 07/20/2017    Procedure: OSTEOTOMYMERL MC W/WO INT FIXA;  Surgeon: Fonda Rosalynn Cooler, MD;  Location: ASC OR [facility];  Service: Ortho Foot & Ankle    PR OSTEOTOMY METATARSAL (NOT 1ST) Left 06/06/2018    Procedure: OSTEOTOMY, W/WO LENGTHENING, SHORTENING OR ANGULAR CORRECTION, METATARSAL; OTHER THAN FIRST METATARSAL, EA;  Surgeon: Fonda Rosalynn Cooler, MD;  Location: ASC OR [facility];  Service: Orthopedics    PR PART REMV PHALANX OF TOE Left 06/06/2018    Procedure: PART EXC BONE PHALANX TOE;  Surgeon: Fonda Rosalynn Cooler, MD;  Location: ASC OR [facility];  Service: Orthopedics    PR RECONSTR TOTAL SHOULDER IMPLANT Left 11/12/2018    Procedure: R28   ARTHROPLASTY, GLENOHUMERAL JOINT; TOTAL SHOULDER(GLENOID & PROXIMAL HUMERAL REPLACEMENT(EG, TOTAL SHOULDER);  Surgeon: Lamar Marsa Hamming, MD;  Location: [facility] OR [facility];  Service: Orthopedics    PR REMOVAL IMPLANT DEEP Left 11/12/2018    Procedure: REMOVE IMPLANT; DEEP SHOULDER;  Surgeon: Lamar Marsa Hamming, MD;  Location[facility] OR [facility];  Service: Orthopedics    PR REPAIR BICEPS LONG TENDON Left 11/12/2018    Procedure: TENODESIS LONG TENDON BICEPS;  Surgeon: Lamar Marsa Hamming, MD;  [facility] OR [facility];  Service: Orthopedics    PR REPAIR PERONEAL TENDONS,FIB OSTEOTMY Left 07/20/2017    Procedure: REPR DISLOC PERONEAL TENDONS; W/FIB OSTEOTOMY;  Surgeon: Fonda Rosalynn Cooler, MD;  Location: ASC OR [facility];  Service: Ortho Foot & Ankle    PR REVISE MEDIAN N/CARPAL TUNNEL SURG Left 02/17/2019    Procedure: R22 NEUROPLASTY AND/OR TRANSPOSITION; MEDIAN NERVE AT CARPAL TUNNEL;  Surgeon: Delon Duwaine Terra Jakie, MD;  Location: ASC OR [facility];  Service: Orthopedics    PR REVISE ULNAR NERVE AT ELBOW Left 02/17/2019    Procedure: NEUROPLASTY AND/OR TRANSPOSITION; ULNAR NERVE AT ELBOW;  Surgeon: Delon Duwaine Terra Jakie, MD;  Location: ASC OR [facility];  Service: Orthopedics    SHOULDER SURGERY Bilateral     L TSA, R ROTATOR CUFF      SKIN BIOPSY      SPINE SURGERY  1986/1995/2000/2002    LUMBAR FUSION    stent

## 2023-10-14 NOTE — Unmapped (Signed)
 Family Medicine Office Visit    Assessment/Plan    Problem List Items Addressed This Visit          Endocrine    Hypertension associated with type 2 diabetes mellitus       - Well controlled. Goal BP <130/80.  - Continue Amlodipine  10 mg and Metop 25 mg BID.  - CMP 04/2023.         Type 2 diabetes mellitus with stage 2 chronic kidney disease, without long-term current use of insulin     - Primary    DIABETES PLAN     Dm goal is <8.0%  Lab Results   Component Value Date    A1C 8.0 (H) 06/28/2023    A1C 10.0 (H) 05/17/2023    A1C 8.7 (H) 10/27/2022     Improved today at 7.2.    BP goal is <130/80  BP Readings from Last 3 Encounters:   10/18/23 102/63   09/25/23 114/68   09/06/23 122/79       The 10-yr ASCVD Risk score [name]., et al., 2013) failed to calculate due to the following reason:  The 2013 10-yr ASCVD risk score is only valid if the patient does not have prior/existing clinical ASCVD (myocardial infarction, stroke, CABG, coronary angioplasty, angina or peripheral arterial disease, coronary atherosclerosis, ischemic heart disease, or cerebrovascular disease). The patient has prior/existing ASCVD.  Had Coronary Angioplasty  Has Angina  Has Coronary Atherosclerosis      Current Diabetes Medications: Metformin  1000 mg PO BID and Jardiance  10 mg once daily     Today's Recommendations:  Continue Metformin  1000 mg BID and Jardiance  10 mg once daily    Of note, with starting Jardiance  discussed history of UTI. No UTI since 2020. Counseled on increased risk in older adults, those with previous infection, and T2DM. Will alert me ASAP if he develops any potential symptoms.     Continue baby aspirin  and pravastatin .    Recommend continued regular exercise as tolerated.  Recommend avoiding carbohydrate rich foods and moderation.    Diabetes Quality Metrics    Metric Comments   Most Recent Foot Exam Date: 05/17/2023    Eye Exam date: 11/14/2022 Eye exam 11/14/2022 with no diabetic retinopathy, scanned under media tab 11/24/2022   Prescribed aspirin : Yes    Prescribed ACE inhibitor: No OR Prescribed ARBs: No    Prescribed statins: No    Pneumonia vaccination: 12/07/2015    Smokes Tobacco: No      Most Recent Diabetes Labs  Lab Results   Component Value Date    LDL 63 05/17/2023    ALBCRERATIO 3.7 11/28/2016    NA 138 05/17/2023    K 4.1 05/17/2023    CREATININE 1.08 05/17/2023            CKD stage 2 due to type 2 diabetes mellitus       Lab Results   Component Value Date    CREATININE 1.08 05/17/2023     - Most likely due to chronic T2DM and HTN.              # Screening and Prevention  - Colorectal Screening:  Brought records from [place] (scanned) Colonoscopy performed 2015 - three 2-5 mm hyperplastic polyps in the sigmoid colon, internal hemorrhoids, repeat 10 years (2025). Declines further screening.  - Hep C Ab:  Negative.  - HIV Ab:  Negative.    # Vaccines  - TDaP: 2017.  - pneumovax: 2016, 2017.  - seasonal  influenza: Received.  - COVID:  Received 2023.    I personally spent 35 minutes face-to-face and non-face-to-face in the care of this patient, which includes all pre, intra, and post visit time on the date of service.       Subjective:    Chief Complaint   Patient presents with    Follow-up     -DM       History of Present Illness  Christopher [name] is a 73 y.o. male who is here for follow-up.    HTN: Taking Amlodipine  10 mg once daily and Metop 25 mg BID    T2DM: Taking Jardiance  10 mg once daily and metformin  1000 mg BID      Physical Exam:  Vitals Reviewed:  BP 102/63 (BP Site: R Arm, BP Position: Sitting, BP Cuff Size: Medium)  - Pulse 64  - Temp 36.2 ??C (97.2 ??F) (Temporal)  - Ht 165.1 cm (5' 5)  - Wt 79.5 kg (175 lb 3.2 oz)  - BMI 29.15 kg/m??    Weight:   Wt Readings from Last 12 Encounters:   10/18/23 79.5 kg (175 lb 3.2 oz)   09/25/23 80.5 kg (177 lb 6.4 oz)   09/06/23 76.7 kg (169 lb)   06/28/23 78.5 kg (173 lb)   05/17/23 82.1 kg (181 lb)   02/28/23 86.6 kg (191 lb)   02/27/23 86.7 kg (191 lb 3.2 oz)   01/10/23 85.4 kg (188 lb 3.2 oz)   12/11/22 88.9 kg (196 lb)   11/24/22 86.2 kg (190 lb)   10/27/22 87.1 kg (192 lb)   10/03/22 86.5 kg (190 lb 12.8 oz)     General: well-nourished, no acute distress, pleasant.

## 2023-10-14 NOTE — Unmapped (Signed)
-   Well controlled. Goal BP <130/80.  - Continue Amlodipine 10 mg and Metop 25 mg BID.  - CMP 04/2023.

## 2023-10-14 NOTE — Unmapped (Addendum)
 DIABETES PLAN     Dm goal is <8.0%  Lab Results   Component Value Date    A1C 8.0 (H) 06/28/2023    A1C 10.0 (H) 05/17/2023    A1C 8.7 (H) 10/27/2022     Improved today at 7.2.    BP goal is <130/80  BP Readings from Last 3 Encounters:   10/18/23 102/63   09/25/23 114/68   09/06/23 122/79       The 10-yr ASCVD Risk score [name]., et al., 2013) failed to calculate due to the following reason:  The 2013 10-yr ASCVD risk score is only valid if the patient does not have prior/existing clinical ASCVD (myocardial infarction, stroke, CABG, coronary angioplasty, angina or peripheral arterial disease, coronary atherosclerosis, ischemic heart disease, or cerebrovascular disease). The patient has prior/existing ASCVD.  Had Coronary Angioplasty  Has Angina  Has Coronary Atherosclerosis      Current Diabetes Medications: Metformin  1000 mg PO BID and Jardiance  10 mg once daily     Today's Recommendations:  Continue Metformin  1000 mg BID and Jardiance  10 mg once daily    Of note, with starting Jardiance  discussed history of UTI. No UTI since 2020. Counseled on increased risk in older adults, those with previous infection, and T2DM. Will alert me ASAP if he develops any potential symptoms.     Continue baby aspirin  and pravastatin .    Recommend continued regular exercise as tolerated.  Recommend avoiding carbohydrate rich foods and moderation.    Diabetes Quality Metrics    Metric Comments   Most Recent Foot Exam Date: 05/17/2023    Eye Exam date: 11/14/2022 Eye exam 11/14/2022 with no diabetic retinopathy, scanned under media tab 11/24/2022   Prescribed aspirin : Yes    Prescribed ACE inhibitor: No OR Prescribed ARBs: No    Prescribed statins: No    Pneumonia vaccination: 12/07/2015    Smokes Tobacco: No      Most Recent Diabetes Labs  Lab Results   Component Value Date    LDL 63 05/17/2023    ALBCRERATIO 3.7 11/28/2016    NA 138 05/17/2023    K 4.1 05/17/2023    CREATININE 1.08 05/17/2023

## 2023-10-15 ENCOUNTER — Encounter: Admit: 2023-10-15 | Discharge: 2023-10-15 | Payer: Medicare (Managed Care)

## 2023-10-15 DIAGNOSIS — Z96612 Presence of left artificial shoulder joint: Principal | ICD-10-CM

## 2023-10-15 NOTE — Unmapped (Deleted)
 Name:  Christopher [name]    MR#:  [id number]    DOB: 1950/09/21    Location: [facility]  REQUEST AND CONSENT FOR PROCEDURE  HIM [phone]  I authorize Dr. ____Gregory KATHEE Beery, MD_______________________    and/or associates and assistants of his/her choice at the [facility] (referred to herein as facility) to perform the following procedures:                         I understand that surgical assistants and/or residents may perform selected tasks under the supervision of my attending surgeon(s). These tasks may include (if applicable): opening and closing a surgical site; dissecting tissue; removing tissue, blood or body fluids; injecting medications; harvesting grafts, transplanting tissue; administering anesthesia; implanting devices; inserting/removing/operating an endoscope for diagnosis or treatment; and placing invasive lines. At the time of the procedure(s), the attending physician will determine the extent of participation by the surgical assistants and/or residents depending on: (1) the complexity of the procedure(s); (2) my unique circumstances as the patient; and (3) the surgical assistants' or residents' training and experience.      I request that necessary and appropriate anesthesia and medications be given to me.      I understand that, during the operation or procedure(s), it is possible for something unexpected to happen that may require another or different procedure(s) to be performed on me. In that situation, I authorize my above-named health care providors or providers identified as necessary by my surgical team to do what is medically necessary and appropriate for me.      I have discussed with my health care provider the following issues, as appropriate to my care: [intitial one]:  _____ Authorization for Blood Products: I authorize medically necessary blood and blood products be given to me before, during or after the operation or procedure, as determined by my heath care provider;    _____ Refusal to Authorize Blood Products: : I do NOT authorize blood or blood products be given to me. (The patient or his/her guardian MUST also complete the facility's form for refusal of blood products.)      I have had an opportunity to ask questions, have had those questions answered, and have received sufficient information so that I have a general understanding of:  my medical condition,  the nature and benefits of the operation or procedure(s),  the usual and most frequent risks of the operation or procedure(s),  the risks and benefits of the alternative treatment(s), and  the prognosis of my condition with and without the operation or procedure(s).      I am aware that the practice of medicine (including surgery) is not an exact science, and no one has made any guarantees about the results of my procedure(s).      I understand this procedure(s) may result in the use of a human tissue implant, non-human implant or collagen received from a facility registered with the U.S. Food and Drug Administration. Risks with implanted tissue include infection from bacteria or viruses which include but may not be limited to HIV and/or the hepatitis viruses.      I give permission for employees, agents, or independent contractors of the facility to do the following, as long as any action they take is consistent with policies and laws that protect my rights:  take photographs  or make videos or drawings of me for permissible treatment, payment, or health care operations purposes (which may include quality assessment, education, and training), and to use or disclose such photographs, videos or drawings consistent with these purposes;  examine and dispose of any tissue, blood, or body parts that may be removed during the operation or procedure(s) or use such tissue, blood, or body parts removed during the procedure(s) for education or research; and  for the purposes of advancing healthcare education, I give consent for observers authorized by the facility to be present during the procedure(s).      For women of childbearing age: I understand there may be the potential need for diagnostic x-ray(s) during my procedure. Exposure to x-ray may cause serious injury to the unborn fetus. Large x-ray exposures to the unborn fetus have been known to cause birth defects and abortion. Doses from diagnostic x-ray are not considered ???large???, but there is a potential risk of serious injury to the unborn fetus. I understand and have no further questions.      Based on my discussion with my health care provider and the information that I have received, I give my consent to the procedure(s). I confirm that I have read this form, or that it was read to me, that all blank spaces were filled in as appropriate, and all sections that I do not agree with were crossed out and initialed before I signed below.                                    _____________________________                 Signature of Patient   (or person authorized to sign for patient)   10/15/23  Date 11:24 AM  Time         ________________________________  Relationship to Patient (if applicable)      WITNESS CERTIFICATION    The patient (or person authorized to sign for the patient) has answered yes to all of the following questions:  Did a health care provider explain the operation or procedure to you?  Did a health care provider explain that selected tasks may be performed by assistant(s)/resident(s)?  Did a health care provider explain alternative procedures and treatments and their risks and benefits?  Have you given your consent for the operation or procedure(s)?  Have all of your questions about the procedure(s) been answered?                               _____________________________  Witness Signature   10/15/23  Date 11:24 AM  Time     _______________________  Witness Printed Name

## 2023-10-15 NOTE — Unmapped (Signed)
 SPORTS MEDICINE RETURN VISIT    ASSESSMENT AND PLAN      Diagnosis ICD-10-CM Associated Orders   1. S/P reverse total shoulder arthroplasty, left  Z96.612 Cell Count (fluid analysis)     Prosthetic Device/Prosthetic Joint Culture           Assessment & Plan  Status post left reverse shoulder arthroplasty with chronic pain.  Given no benefit from the [facility] joint injection, Dr. Carolee spoke with me and requested that I perform aspiration.  R/B/A were discussed including risk of causing prosthetic joint infection.  He elected to proceed of the aspiration was performed as outlined below.  Cell count and culture were ordered.  I will relay those results to Dr. Carolee once they finalize.  The aspirate was frankly bloody.    No follow-ups on file.    Procedure(s):  PROCEDURE NOTE:  Left shoulder aspiration    Consent   After discussing the various treatment options for the condition, It was agreed that a knee aspiration would be required to rule out an occult infection.   The nature of and the indications for the aspiration were reviewed in detail with the patient today. The inherent risks of injection including infection, bleeding, nerve injury and/or infecton were discussed.     Procedure :  Left shoulder aspiration  The risks and benefits of the procedure were explained,verbal consent was given, and a procedural time-out was performed. The lateral GH joint / sulcusand surrounding structures were visualized with ultrasound. The site for the aspiration was properly marked and prepped with Chloroprep x3.  The injection site was anesthetized with ethyl chloride and 4cc of 1% Lidocaine  with a 25 gauge 2 inch needle. Then, the skin was reprepped with choloroprep x3, and using ultrasound guidance, the shoulder was visualized and aspirated.  A total of 2 cc were obtained    NEED FOR SONOGRAPHIC GUIDANCE    Given the complexity of this problem, the anatomic location of this structure, sonographic guidance is recommended to prevent injury to neurovascular structures and confirm accuracy of needle placement. The accuracy of doing these procedures blind is poor and the benefit to the patient by using ultrasound guidance is significant to avoid complications.     Reference:  American Medical Society for Sports Medicine (AMSSM) position statement: interventional musculoskeletal ultrasound in sports medicine.  Garnetta ALEC Hurst MM, Adams E, Berkoff D, Concoff AL, [place] LELON Claudene DOROTHA Mallissa JINNY Sports Med. 2014 Oct 20. pii: [email or web address]. doi: 10.1136/[email or web address]  Post procedure a sterile band-aide was applied. Post-injection instructions were given regarding post-procedure care, when to follow up in clinic and what to expect from the procedure. The patient tolerated the injection well and was discharged without complication.    NEED FOR SONOGRAPHIC GUIDANCE  Given the complexity of this problem and the anatomic location of this structure, sonographic guidance is recommended to prevent injury to neurovascular structures and confirm accuracy of injection. The accuracy of doing these injections blind is poor and the benefit to the patient by using ultrasound guidance is significant to avoid complications.     Reference:  American Medical Society for Sports Medicine (AMSSM) position statement: interventional musculoskeletal ultrasound in sports medicine.  Garnetta ALEC Hurst MM, Adams E, Berkoff D, Concoff AL, Glad[place] LELON Claudene DOROTHA Mallissa J Sports Med. 2014 Oct 20. pii: [email or web address]. doi: [email or web address]/[email or web address]        SUBJECTIVE     DATE OF PROCEDURE: 11/12/2018     PROCEDURE PERFORMED: Left reverse shoulder arthroplasty  Christopher [name] is a 73 year old male with diabetes who presents with left shoulder pain.    He has been experiencing shoulder pain for approximately six months. Initially minor, the pain has progressively worsened over time. It is exacerbated by pressure, such as when lifting objects while seated, and is localized to the shoulder, radiating across the area.    He experiences difficulty with certain movements, such as reaching across his body and lifting his arm behind his back, which causes discomfort. The pain is particularly pronounced during these actions, and he describes it as severe when trying to pick up anything while seated.    He has a history of diabetes, with a recent A1c reported to be 8, improved from a previous level of 10. This improvement is attributed to a new medication regimen and significant dietary changes, including reducing his intake of sweets.    ------------------------    10/15/2023: He returns today for a procedure only visit for left reverse total shoulder arthroplasty aspiration at the request of Dr. Carolee.  AC joint injection I performed at our last visit provided no benefit.  Dr. Carolee spoke with me and requested aspiration.    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 as calculated from the following:    Height as of 09/06/23: 165.1 cm (5' 5).    Weight as of 09/25/23: 80.5 kg (177 lb 6.4 oz).  Gen: Well-appearing male in no acute distress  MSK: Left shoulder  Well-healed deltopectoral incision  Skin and soft tissue intact without soft tissue swelling    Imaging/other tests: X-ray left shoulder:/05/2023 demonstrates reverse total shoulder arthroplasty.  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  The stem is well-seated without evidence of lucency, but there is some mild gapping seen on the axillary view    Lab Results   Component Value Date    A1C 8.0 (H) 06/28/2023         ADMINISTRATIVE     I have personally reviewed and interpreted the images (as available).  Point-of-care ultrasound imaging is on file and stored in a permanent location (if performed).  I have personally reviewed prior records and incorporated relevant information above (as available).  Note written with [employer].      PROCEDURES     Procedures     DME     DME ORDER:  Dx:  ,

## 2023-10-18 NOTE — Unmapped (Signed)
 Lab Results   Component Value Date    CREATININE 1.08 05/17/2023     - Most likely due to chronic T2DM and HTN.

## 2023-10-24 MED ORDER — TROSPIUM 20 MG TABLET
ORAL_TABLET | Freq: Two times a day (BID) | ORAL | 3 refills | 90.00000 days
Start: 2023-10-24 — End: 2024-10-18

## 2023-10-29 MED ORDER — TROSPIUM 20 MG TABLET
ORAL_TABLET | Freq: Two times a day (BID) | ORAL | 3 refills | 90.00000 days | Status: CP
Start: 2023-10-29 — End: 2024-10-23

## 2023-11-18 DIAGNOSIS — N182 Chronic kidney disease, stage 2 (mild): Principal | ICD-10-CM

## 2023-11-18 DIAGNOSIS — E1122 Type 2 diabetes mellitus with diabetic chronic kidney disease: Principal | ICD-10-CM

## 2023-11-18 DIAGNOSIS — F32A Depression, unspecified depression type: Principal | ICD-10-CM

## 2023-11-18 MED ORDER — METFORMIN 1,000 MG TABLET
ORAL_TABLET | 3 refills | 0.00000 days
Start: 2023-11-18 — End: ?

## 2023-11-18 MED ORDER — SERTRALINE 100 MG TABLET
ORAL_TABLET | Freq: Every day | ORAL | 3 refills | 0.00000 days
Start: 2023-11-18 — End: ?

## 2023-11-19 MED ORDER — SERTRALINE 100 MG TABLET
ORAL_TABLET | Freq: Every day | ORAL | 3 refills | 90.00000 days
Start: 2023-11-19 — End: ?

## 2023-11-19 MED ORDER — METFORMIN 1,000 MG TABLET
ORAL_TABLET | 3 refills | 0.00000 days
Start: 2023-11-19 — End: ?

## 2023-12-11 NOTE — Unmapped (Addendum)
 DIABETES PLAN     Dm goal is <8.0%  Lab Results   Component Value Date    A1C 7.2 (H) 10/18/2023    A1C 8.0 (H) 06/28/2023    A1C 10.0 (H) 05/17/2023     Repeat HbA1c pending.  BP goal is <130/80  BP Readings from Last 3 Encounters:   12/12/23 133/84   10/18/23 102/63   09/25/23 114/68       The 10-yr ASCVD Risk score [name]., et al., 2013) failed to calculate due to the following reason:  The 2013 10-yr ASCVD risk score is only valid if the patient does not have prior/existing clinical ASCVD (myocardial infarction, stroke, CABG, coronary angioplasty, angina or peripheral arterial disease, coronary atherosclerosis, ischemic heart disease, or cerebrovascular disease). The patient has prior/existing ASCVD.  Had Coronary Angioplasty  Has Angina  Has Coronary Atherosclerosis      Current Diabetes Medications: Metformin  1000 mg PO BID and Jardiance  10 mg once daily     Today's Recommendations:  Continue Metformin  1000 mg BID and Jardiance  10 mg once daily    Of note, with starting Jardiance  discussed history of UTI. No UTI since 2020. Counseled on increased risk in older adults, those with previous infection, and T2DM. Will alert me ASAP if he develops any potential symptoms.     Continue baby aspirin  and pravastatin .    Recommend continued regular exercise as tolerated.  Recommend avoiding carbohydrate rich foods and moderation.    Diabetes Quality Metrics    Metric Comments   Most Recent Foot Exam Date: 05/17/2023    Eye Exam date: 11/14/2022 Eye exam 11/14/2022 with no diabetic retinopathy, scanned under media tab 11/24/2022   Prescribed aspirin : Yes    Prescribed ACE inhibitor: No OR Prescribed ARBs: No    Prescribed statins: No    Pneumonia vaccination: 12/07/2015    Smokes Tobacco: No      Most Recent Diabetes Labs  Lab Results   Component Value Date    LDL 63 05/17/2023    ALBCRERATIO 3.7 11/28/2016    NA 138 05/17/2023    K 4.1 05/17/2023    CREATININE 1.08 05/17/2023

## 2023-12-11 NOTE — Unmapped (Signed)
S/P second CABG 03/20/2022, doing well without any concerns. Continue follow-up with cardiology.

## 2023-12-11 NOTE — Unmapped (Addendum)
-   Not at goal BP <130/80; however, has not taken medications today so will hold on changes.  - Continue Amlodipine  10 mg and Metop 25 mg BID.  - CMP 04/2023.

## 2023-12-11 NOTE — Unmapped (Signed)
 Family Medicine Office Visit    Assessment/Plan    Problem List Items Addressed This Visit          Digestive    Dysphagia    Patient reports history of chronic dysphagia for many years 2/2 esophageal stricture or structural abnormality necessitating dilation. Reports last EGD >10 years ago. No records available in Epic. With worsened chronic dysphagia with weight loss and progression to difficulty swallowing both liquids and solids, will order EGD to determine etiology and next steps.         Relevant Orders    Upper Endoscopy       Endocrine    Hypertension associated with type 2 diabetes mellitus        - Not at goal BP <130/80; however, has not taken medications today so will hold on changes.  - Continue Amlodipine  10 mg and Metop 25 mg BID.  - CMP 04/2023.         Type 2 diabetes mellitus with stage 2 chronic kidney disease, without long-term current use of insulin         DIABETES PLAN     Dm goal is <8.0%  Lab Results   Component Value Date    A1C 7.2 (H) 10/18/2023    A1C 8.0 (H) 06/28/2023    A1C 10.0 (H) 05/17/2023     Repeat HbA1c pending.  BP goal is <130/80  BP Readings from Last 3 Encounters:   12/12/23 133/84   10/18/23 102/63   09/25/23 114/68       The 10-yr ASCVD Risk score [name]., et al., 2013) failed to calculate due to the following reason:  The 2013 10-yr ASCVD risk score is only valid if the patient does not have prior/existing clinical ASCVD (myocardial infarction, stroke, CABG, coronary angioplasty, angina or peripheral arterial disease, coronary atherosclerosis, ischemic heart disease, or cerebrovascular disease). The patient has prior/existing ASCVD.  Had Coronary Angioplasty  Has Angina  Has Coronary Atherosclerosis      Current Diabetes Medications: Metformin  1000 mg PO BID and Jardiance  10 mg once daily     Today's Recommendations:  Continue Metformin  1000 mg BID and Jardiance  10 mg once daily    Of note, with starting Jardiance  discussed history of UTI. No UTI since 2020. Counseled on increased risk in older adults, those with previous infection, and T2DM. Will alert me ASAP if he develops any potential symptoms.     Continue baby aspirin  and pravastatin .    Recommend continued regular exercise as tolerated.  Recommend avoiding carbohydrate rich foods and moderation.    Diabetes Quality Metrics    Metric Comments   Most Recent Foot Exam Date: 05/17/2023    Eye Exam date: 11/14/2022 Eye exam 11/14/2022 with no diabetic retinopathy, scanned under media tab 11/24/2022   Prescribed aspirin : Yes    Prescribed ACE inhibitor: No OR Prescribed ARBs: No    Prescribed statins: No    Pneumonia vaccination: 12/07/2015    Smokes Tobacco: No      Most Recent Diabetes Labs  Lab Results   Component Value Date    LDL 63 05/17/2023    ALBCRERATIO 3.7 11/28/2016    NA 138 05/17/2023    K 4.1 05/17/2023    CREATININE 1.08 05/17/2023            Relevant Orders    Albumin /creatinine urine ratio    Hemoglobin A1c    CKD stage 2 due to type 2 diabetes mellitus  Lab Results   Component Value Date    CREATININE 1.08 05/17/2023     - Most likely due to chronic T2DM and HTN.  - Repeat CMP pending.            Cardiovascular and Mediastinum    Coronary artery disease involving native coronary artery of native heart without angina pectoris    S/P second CABG 03/20/2022, doing well without any concerns. Continue follow-up with cardiology.            Other    Cough    Chronic cough with right-sided chest pain, likely musculoskeletal such as costochondritis or esophageal involvement. Lung sounds clear and no tenderness on exam.  - Order chest x-ray to evaluate chest pain and cough.  - Recommend Voltaren  gel topically on right side of chest for inflammation.         Relevant Orders    XR Chest 2 views (Completed)    Epigastric pain    Potentially related to chest pain or difficulty swallowing or GERD.  - Will evaluate with abd XR and CMP, lipase.   - FU 4 weeks.         Relevant Orders    Comprehensive Metabolic Panel Lipase    XR Abdomen 2 Views (Completed)     Other Visit Diagnoses         Annual physical exam    -  Primary              # Screening and Prevention  - Colorectal Screening:  Brought records from [place] (scanned) Colonoscopy performed 2015 - three 2-5 mm hyperplastic polyps in the sigmoid colon, internal hemorrhoids, repeat 10 years (2025). Declines further screening.  - Hep C Ab:  Negative.  - HIV Ab:  Negative.    # Vaccines  - TDaP: 2017.  - pneumovax: 2016, 2017.  - seasonal influenza: Received.  - COVID:  Received 2023.    I personally spent 35 minutes face-to-face and non-face-to-face in the care of this patient, which includes all pre, intra, and post visit time on the date of service.       Subjective:    Chief Complaint   Patient presents with    Follow-up     Diabetes     Cough     Been going on for a couple of months feels like it is affecting right lung        History of Present Illness  Christopher [name] is a 73 year old male who presents with right-sided chest pain and difficulty swallowing.    He has been experiencing right-sided chest pain for a couple of months, which occurs when he coughs or sneezes. The pain is located anterior on right side his chest, is worse in the mornings, and is exacerbated when lying on his right side, causing tightness and discomfort. The pain is present with deep breathing, but there is no sputum production.    He has a long-standing history of difficulty swallowing, which has progressively worsened over the past six months. He describes episodes where his esophagus 'closes up,' making it difficult to swallow solids and occasionally liquids. He has had his esophagus dilated twice over ten years ago, but was informed that it could not be stretched further. He has been losing weight unintentionally despite eating a bowl of cereal in the morning and a good supper, with no snacking during the day.    He has a history of diabetes and is currently  taking metformin  and Jardiance  without any issues. He forgot to take his blood pressure medication this morning, but his blood pressure was noted to be good. He is due for an eye exam, which he usually gets done at [facility].    He reports having trouble sleeping at night, despite taking trazodone . He has difficulty both falling asleep and staying asleep, attributing some of his sleep issues to worries about his family, as his brother and sister-in-law are both hospitalized with serious conditions.    No urinary symptoms such as burning or blood in the urine are present. He reports normal bowel movements without any complaints.          Physical Exam:  Vitals Reviewed:  BP 133/84 (BP Site: R Arm, BP Position: Sitting, BP Cuff Size: Medium)  - Pulse 72  - Temp 36.5 ??C (97.7 ??F) (Temporal)  - Ht 165.1 cm (5' 5) Comment: pt reported - Wt 76.2 kg (168 lb)  - BMI 27.96 kg/m??    Weight:   Wt Readings from Last 12 Encounters:   12/12/23 76.2 kg (168 lb)   10/18/23 79.5 kg (175 lb 3.2 oz)   09/25/23 80.5 kg (177 lb 6.4 oz)   09/06/23 76.7 kg (169 lb)   06/28/23 78.5 kg (173 lb)   05/17/23 82.1 kg (181 lb)   02/28/23 86.6 kg (191 lb)   02/27/23 86.7 kg (191 lb 3.2 oz)   01/10/23 85.4 kg (188 lb 3.2 oz)   12/11/22 88.9 kg (196 lb)   11/24/22 86.2 kg (190 lb)   10/27/22 87.1 kg (192 lb)     General: well-nourished, no acute distress, pleasant.   Head: normocephalic, atraumatic  Eyes: sclera clear, conjunctiva normal, external ocular movements intact.  Chest: No chest wall tenderness  Heart: RRR, no M/R/G.   Lungs: CTAB, no wheezes.  GI: +BS, TTP epigastrum, no rebound or guarding  Skin: warm, well perfused, no rashes.   Psychiatry: A&O to person, place, and time. Mood appropriately reactive.  Thought process linear and goal directed.  Neurological: Normal gait. Cranial nerves 2-12 grossly intact. '

## 2023-12-11 NOTE — Unmapped (Addendum)
 Lab Results   Component Value Date    CREATININE 1.08 05/17/2023     - Most likely due to chronic T2DM and HTN.  - Repeat CMP pending.

## 2023-12-12 ENCOUNTER — Encounter: Admit: 2023-12-12 | Discharge: 2023-12-12 | Payer: Medicare (Managed Care)

## 2023-12-12 ENCOUNTER — Inpatient Hospital Stay: Admit: 2023-12-12 | Discharge: 2023-12-12 | Payer: Medicare (Managed Care)

## 2023-12-12 DIAGNOSIS — Z0000 Encounter for general adult medical examination without abnormal findings: Principal | ICD-10-CM

## 2023-12-12 DIAGNOSIS — N182 Chronic kidney disease, stage 2 (mild): Principal | ICD-10-CM

## 2023-12-12 DIAGNOSIS — E1122 Type 2 diabetes mellitus with diabetic chronic kidney disease: Principal | ICD-10-CM

## 2023-12-12 DIAGNOSIS — I2510 Atherosclerotic heart disease of native coronary artery without angina pectoris: Principal | ICD-10-CM

## 2023-12-12 DIAGNOSIS — R1013 Epigastric pain: Principal | ICD-10-CM

## 2023-12-12 DIAGNOSIS — E1159 Type 2 diabetes mellitus with other circulatory complications: Principal | ICD-10-CM

## 2023-12-12 DIAGNOSIS — R059 Cough, unspecified type: Principal | ICD-10-CM

## 2023-12-12 DIAGNOSIS — I152 Hypertension secondary to endocrine disorders: Principal | ICD-10-CM

## 2023-12-12 DIAGNOSIS — R1310 Dysphagia, unspecified: Principal | ICD-10-CM

## 2023-12-12 LAB — COMPREHENSIVE METABOLIC PANEL
ALBUMIN: 4.2 g/dL (ref 3.4–5.0)
ALKALINE PHOSPHATASE: 94 U/L (ref 46–116)
ALT (SGPT): 14 U/L (ref 10–49)
ANION GAP: 11 mmol/L (ref 5–14)
AST (SGOT): 23 U/L (ref <=34)
BILIRUBIN TOTAL: 0.4 mg/dL (ref 0.3–1.2)
BLOOD UREA NITROGEN: 18 mg/dL (ref 9–23)
BUN / CREAT RATIO: 16
CALCIUM: 10.2 mg/dL (ref 8.7–10.4)
CHLORIDE: 102 mmol/L (ref 98–107)
CO2: 27 mmol/L (ref 20.0–31.0)
CREATININE: 1.13 mg/dL (ref 0.73–1.18)
EGFR CKD-EPI (2021) MALE: 68 mL/min/1.73m2 (ref >=60)
GLUCOSE RANDOM: 111 mg/dL (ref 70–179)
POTASSIUM: 4.2 mmol/L (ref 3.4–4.8)
PROTEIN TOTAL: 8 g/dL (ref 5.7–8.2)
SODIUM: 140 mmol/L (ref 135–145)

## 2023-12-12 LAB — LIPASE: LIPASE: 27 U/L (ref 12–53)

## 2023-12-12 LAB — ALBUMIN / CREATININE URINE RATIO
ALBUMIN QUANT URINE: 2.8 mg/dL
ALBUMIN/CREATININE RATIO: 36.8 ug/mg — ABNORMAL HIGH (ref 0.0–30.0)
CREATININE, URINE: 76.1 mg/dL

## 2023-12-12 LAB — HEMOGLOBIN A1C
ESTIMATED AVERAGE GLUCOSE: 163 mg/dL
HEMOGLOBIN A1C: 7.3 % — ABNORMAL HIGH (ref 4.8–5.6)

## 2023-12-12 NOTE — Unmapped (Signed)
 Chronic cough with right-sided chest pain, likely musculoskeletal such as costochondritis or esophageal involvement. Lung sounds clear and no tenderness on exam.  - Order chest x-ray to evaluate chest pain and cough.  - Recommend Voltaren  gel topically on right side of chest for inflammation.

## 2023-12-12 NOTE — Unmapped (Signed)
 Potentially related to chest pain or difficulty swallowing or GERD.  - Will evaluate with abd XR and CMP, lipase.   - FU 4 weeks.

## 2023-12-12 NOTE — Unmapped (Signed)
 Patient reports history of chronic dysphagia for many years 2/2 esophageal stricture or structural abnormality necessitating dilation. Reports last EGD >10 years ago. No records available in Epic. With worsened chronic dysphagia with weight loss and progression to difficulty swallowing both liquids and solids, will order EGD to determine etiology and next steps.

## 2023-12-13 NOTE — Unmapped (Signed)
 EGD  Procedure #1     Procedure #2   [id number]  MRN   generic  Endoscopist     Is the patient's health insurance [employer], [employer], [facility] [facility]), [employer] [place], [employer], or [employer]?     Urgent procedure     Are you pregnant?     Are you in the process of scheduling or awaiting results of a heart ultrasound, stress test, or catheterization to evaluate new or worsening chest pain, dizziness, or shortness of breath?     Do you take: Plavix  (clopidogrel ), Coumadin (warfarin), Lovenox (enoxaparin), Pradaxa (dabigatran), Effient (prasugrel), Xarelto (rivaroxaban), Eliquis (apixaban), Pletal (cilostazol), or Brilinta (ticagrelor)?          Did ordering provider indicate how long to hold this medication in the order comments?          Which of the above medications are you taking?          What is the name of the medical practice that manages this medication?          What is the name of the medical provider who manages this medication?     Do you have hemophilia, von Willebrand disease, or low platelets?     Do you have a pacemaker or implanted cardiac defibrillator?     Has a [facility] provider specified the location(s)?     Which location(s) did the [facility] provider specify?          [facility]          [place]          [facility]      Do you see a liver specialist for chronic liver disease?     Is the procedure indication for variceal screening?     Is procedure indication for variceal banding (this does NOT include variceal screening)?     Have you had a heart attack, stroke or heart stent placement within the past 6 months?     Month of event     Year of event (ONLY ENTER LAST 2 DIGITS)        5  Height (feet)   5  Height (inches)   163  Weight (pounds)   27.1  BMI          Did the ordering provider specify a bowel prep?          What bowel prep was specified?     Do you have an ostomy (bag on your stomach that collects your stool)?          Is it an ileostomy?          Is it a colostomy? Patient doesn't know.     Do you have chronic kidney disease?     Do you have chronic constipation or have you had poor quality bowel preps for past colonoscopies?     Do you have Crohn's disease or ulcerative colitis?     Have you had weight loss surgery?          When you walk around your house or grocery store, do you have to stop and rest due to shortness of breath, chest pain, or light-headedness?     Do you ever use supplemental oxygen?     Have you been hospitalized for cirrhosis of the liver or heart failure in the last 12 months?     Have you been treated for mouth or throat cancer with radiation or surgery?  Have you been told that it is difficult for doctors to insert a breathing tube in you during anesthesia?     Have you had a heart or lung transplant?          Are you on dialysis?     Do you have cirrhosis of the liver?     Do you have myasthenia gravis?     Is the patient a prisoner?   ################# ## ###################################################################################################################   MRN:  [id number]   Anticoag Review  No   Nurse Triage  No   GI clinic consult  No   Procedure(s):  EGD     0   Endoscopist:  generic   Urgent:  No   Prep:                    --------------------------- --- ----------------------------------------------------------------------------------------------------------------------------------------------------------------------------   G[facility] Locations:  [facility]     [facility]      [place]        Requested Locations:              ################# ## ###################################################################################################################

## 2023-12-14 ENCOUNTER — Encounter
Admit: 2023-12-14 | Discharge: 2023-12-14 | Payer: Medicare (Managed Care) | Attending: Anesthesiology | Primary: Anesthesiology

## 2023-12-14 ENCOUNTER — Inpatient Hospital Stay: Admit: 2023-12-14 | Discharge: 2023-12-14 | Payer: Medicare (Managed Care)

## 2023-12-14 MED ADMIN — sodium chloride (NS) 0.9 % infusion: 10 mL/h | INTRAVENOUS | @ 12:00:00 | Stop: 2023-12-14

## 2023-12-14 MED ADMIN — fentaNYL (PF) (SUBLIMAZE) injection: INTRAVENOUS | @ 13:00:00 | Stop: 2023-12-14

## 2023-12-14 MED ADMIN — dexmedeTOMIDine (Precedex) 200 mcg in sodium chloride (NS) 0.9 % 50 mL infusion: INTRAVENOUS | @ 13:00:00 | Stop: 2023-12-14

## 2023-12-14 MED ADMIN — Propofol (DIPRIVAN) injection: INTRAVENOUS | @ 13:00:00 | Stop: 2023-12-14

## 2023-12-18 DIAGNOSIS — R1310 Dysphagia, unspecified: Principal | ICD-10-CM

## 2023-12-18 DIAGNOSIS — E1122 Type 2 diabetes mellitus with diabetic chronic kidney disease: Principal | ICD-10-CM

## 2023-12-18 DIAGNOSIS — E1159 Type 2 diabetes mellitus with other circulatory complications: Principal | ICD-10-CM

## 2023-12-18 DIAGNOSIS — I152 Hypertension secondary to endocrine disorders: Principal | ICD-10-CM

## 2023-12-18 DIAGNOSIS — N182 Chronic kidney disease, stage 2 (mild): Principal | ICD-10-CM

## 2023-12-18 NOTE — Unmapped (Signed)
 Patient reports history of chronic dysphagia for many years 2/2 esophageal stricture or structural abnormality necessitating dilation. Reports last EGD >10 years ago. No records available in Epic. With worsened chronic dysphagia with weight loss and progression to difficulty swallowing both liquids and solids, ordered EGD.  - Abd XR 12/12/2023 with nonobstructive bowel gas pattern with moderate to large volume of solid colonic stool. CXR 12/12/2023 normal.  - EGD 12/14/2023 with widely patent Schatzki ring was found at the GE junction. A large amount of food residue found in the entire examined stomach. The procedure was subsequently aborted. Plan for repeat EGD at [facility] main with CLD for the day prior to the exam.  - Ordered gastric emptying study for potential gastroparesis.

## 2023-12-18 NOTE — Unmapped (Addendum)
-   Not at goal BP <130/80; however, close so will not make adjustments.  - Continue Amlodipine  10 mg and Metop 25 mg BID.  - CMP 11/2023.

## 2023-12-18 NOTE — Unmapped (Signed)
Colonoscopy scheduling is delayed.  They will likely not call you to schedule.  You should call them at:  [phone], option 1, then option 2 to schedule your colonoscopy.

## 2023-12-18 NOTE — Unmapped (Addendum)
 DIABETES PLAN     Dm goal is <8.0%  Lab Results   Component Value Date    A1C 7.3 (H) 12/12/2023    A1C 7.2 (H) 10/18/2023    A1C 8.0 (H) 06/28/2023       BP goal is <130/80  BP Readings from Last 3 Encounters:   12/18/23 133/80   12/14/23 115/73   12/12/23 133/84       The 10-yr ASCVD Risk score [name]., et al., 2013) failed to calculate due to the following reason:  The 2013 10-yr ASCVD risk score is only valid if the patient does not have prior/existing clinical ASCVD (myocardial infarction, stroke, CABG, coronary angioplasty, angina or peripheral arterial disease, coronary atherosclerosis, ischemic heart disease, or cerebrovascular disease). The patient has prior/existing ASCVD.  Had Coronary Angioplasty  Has Angina  Has Coronary Atherosclerosis      Current Diabetes Medications: Metformin  1000 mg PO BID and Jardiance  10 mg once daily     Today's Recommendations:  Continue Metformin  1000 mg BID and Jardiance  10 mg once daily    Of note, with starting Jardiance  discussed history of UTI. No UTI since 2020. Counseled on increased risk in older adults, those with previous infection, and T2DM. Will alert me ASAP if he develops any potential symptoms.     Continue baby aspirin  and pravastatin .    Recommend continued regular exercise as tolerated.  Recommend avoiding carbohydrate rich foods and moderation.    Diabetes Quality Metrics    Metric Comments   Most Recent Foot Exam Date: 05/17/2023    Eye Exam date: 11/14/2022 Eye exam 11/14/2022 with no diabetic retinopathy, scanned under media tab 11/24/2022   Prescribed aspirin : Yes    Prescribed ACE inhibitor: No OR Prescribed ARBs: No    Prescribed statins: No    Pneumonia vaccination: 12/07/2015    Smokes Tobacco: No      Most Recent Diabetes Labs  Lab Results   Component Value Date    LDL 63 05/17/2023    ALBCRERATIO 3.7 11/28/2016    NA 140 12/12/2023    K 4.2 12/12/2023    CREATININE 1.13 12/12/2023

## 2023-12-18 NOTE — Unmapped (Signed)
 Family Medicine Office Visit    Assessment/Plan    Problem List Items Addressed This Visit          Digestive    Dysphagia - Primary    Patient reports history of chronic dysphagia for many years 2/2 esophageal stricture or structural abnormality necessitating dilation. Reports last EGD >10 years ago. No records available in Epic. With worsened chronic dysphagia with weight loss and progression to difficulty swallowing both liquids and solids, ordered EGD.  - Abd XR 12/12/2023 with nonobstructive bowel gas pattern with moderate to large volume of solid colonic stool. CXR 12/12/2023 normal.  - EGD 12/14/2023 with widely patent Schatzki ring was found at the GE junction. A large amount of food residue found in the entire examined stomach. The procedure was subsequently aborted. Plan for repeat EGD at [facility] main with CLD for the day prior to the exam.  - Ordered gastric emptying study for potential gastroparesis.         Relevant Orders    NM Gastric Emptying       Endocrine    Hypertension associated with type 2 diabetes mellitus        - Not at goal BP <130/80; however, close so will not make adjustments.  - Continue Amlodipine  10 mg and Metop 25 mg BID.  - CMP 11/2023.         Type 2 diabetes mellitus with stage 2 chronic kidney disease, without long-term current use of insulin         DIABETES PLAN     Dm goal is <8.0%  Lab Results   Component Value Date    A1C 7.3 (H) 12/12/2023    A1C 7.2 (H) 10/18/2023    A1C 8.0 (H) 06/28/2023       BP goal is <130/80  BP Readings from Last 3 Encounters:   12/18/23 133/80   12/14/23 115/73   12/12/23 133/84       The 10-yr ASCVD Risk score [name]., et al., 2013) failed to calculate due to the following reason:  The 2013 10-yr ASCVD risk score is only valid if the patient does not have prior/existing clinical ASCVD (myocardial infarction, stroke, CABG, coronary angioplasty, angina or peripheral arterial disease, coronary atherosclerosis, ischemic heart disease, or cerebrovascular disease). The patient has prior/existing ASCVD.  Had Coronary Angioplasty  Has Angina  Has Coronary Atherosclerosis      Current Diabetes Medications: Metformin  1000 mg PO BID and Jardiance  10 mg once daily     Today's Recommendations:  Continue Metformin  1000 mg BID and Jardiance  10 mg once daily    Of note, with starting Jardiance  discussed history of UTI. No UTI since 2020. Counseled on increased risk in older adults, those with previous infection, and T2DM. Will alert me ASAP if he develops any potential symptoms.     Continue baby aspirin  and pravastatin .    Recommend continued regular exercise as tolerated.  Recommend avoiding carbohydrate rich foods and moderation.    Diabetes Quality Metrics    Metric Comments   Most Recent Foot Exam Date: 05/17/2023    Eye Exam date: 11/14/2022 Eye exam 11/14/2022 with no diabetic retinopathy, scanned under media tab 11/24/2022   Prescribed aspirin : Yes    Prescribed ACE inhibitor: No OR Prescribed ARBs: No    Prescribed statins: No    Pneumonia vaccination: 12/07/2015    Smokes Tobacco: No      Most Recent Diabetes Labs  Lab Results   Component Value Date  LDL 63 05/17/2023    ALBCRERATIO 3.7 11/28/2016    NA 140 12/12/2023    K 4.2 12/12/2023    CREATININE 1.13 12/12/2023               # Screening and Prevention  - Colorectal Screening:  Brought records from [place] (scanned) Colonoscopy performed 2015 - three 2-5 mm hyperplastic polyps in the sigmoid colon, internal hemorrhoids, repeat 10 years (2025). Declines further screening.  - Hep C Ab:  Negative.  - HIV Ab:  Negative.    # Vaccines  - TDaP: 2017.  - pneumovax: 2016, 2017.  - seasonal influenza: Received.  - COVID:  Received 2023.    I personally spent 25 minutes face-to-face and non-face-to-face in the care of this patient, which includes all pre, intra, and post visit time on the date of service.       Subjective:    Chief Complaint   Patient presents with    Follow-up       History of Present Illness  Christopher [name] is a 73 year old male who presents with concerns about food retention in the stomach following an endoscopy.    He underwent an endoscopy which revealed a significant amount of food retained in the stomach. This finding was unexpected and has prompted further investigation into potential causes.    He has a history of difficulty swallowing, previously attributed to a Schatzki ring identified during the endoscopy.     He typically eats breakfast and supper.      Physical Exam:  Vitals Reviewed:  BP 133/80 Comment: average bp - Pulse 68  - Temp 36.2 ??C (97.1 ??F) (Temporal)  - Ht 165.1 cm (5' 5) Comment: pt reported - Wt 77.7 kg (171 lb 3.2 oz)  - BMI 28.49 kg/m??    Weight:   Wt Readings from Last 12 Encounters:   12/18/23 77.7 kg (171 lb 3.2 oz)   12/14/23 76.2 kg (168 lb)   12/12/23 76.2 kg (168 lb)   10/18/23 79.5 kg (175 lb 3.2 oz)   09/25/23 80.5 kg (177 lb 6.4 oz)   09/06/23 76.7 kg (169 lb)   06/28/23 78.5 kg (173 lb)   05/17/23 82.1 kg (181 lb)   02/28/23 86.6 kg (191 lb)   02/27/23 86.7 kg (191 lb 3.2 oz)   01/10/23 85.4 kg (188 lb 3.2 oz)   12/11/22 88.9 kg (196 lb)     General: well-nourished, no acute distress, pleasant.

## 2023-12-20 ENCOUNTER — Inpatient Hospital Stay: Admit: 2023-12-20 | Discharge: 2023-12-20 | Payer: Medicare (Managed Care)

## 2023-12-20 ENCOUNTER — Encounter
Admit: 2023-12-20 | Discharge: 2023-12-20 | Payer: Medicare (Managed Care) | Attending: Anesthesiology | Primary: Anesthesiology

## 2023-12-20 MED ADMIN — Propofol (DIPRIVAN) injection: INTRAVENOUS | @ 13:00:00 | Stop: 2023-12-20

## 2023-12-20 MED ADMIN — dexAMETHasone (DECADRON) 4 mg/mL injection: INTRAVENOUS | @ 14:00:00 | Stop: 2023-12-20

## 2023-12-20 MED ADMIN — succinylcholine chloride (ANECTINE) injection: INTRAVENOUS | @ 13:00:00 | Stop: 2023-12-20

## 2023-12-20 MED ADMIN — ondansetron (ZOFRAN) injection: INTRAVENOUS | @ 14:00:00 | Stop: 2023-12-20

## 2023-12-20 MED ADMIN — lactated Ringers infusion: 10 mL/h | INTRAVENOUS | @ 13:00:00 | Stop: 2023-12-20

## 2023-12-20 MED ADMIN — lidocaine (PF) (XYLOCAINE-MPF) 20 mg/mL (2 %) injection: INTRAVENOUS | @ 13:00:00 | Stop: 2023-12-20

## 2023-12-20 MED ADMIN — ePHEDrine (PF) 25 mg/5 mL (5 mg/mL) in 0.9% sodium chloride syringe: INTRAVENOUS | @ 13:00:00 | Stop: 2023-12-20

## 2023-12-20 MED ADMIN — phenylephrine 1 mg/10 mL (100 mcg/mL) injection Syrg: INTRAVENOUS | @ 14:00:00 | Stop: 2023-12-20

## 2023-12-20 MED ADMIN — phenylephrine 1 mg/10 mL (100 mcg/mL) injection Syrg: INTRAVENOUS | @ 13:00:00 | Stop: 2023-12-20

## 2023-12-25 MED FILL — REPATHA SURECLICK 140 MG/ML SUBCUTANEOUS PEN INJECTOR: SUBCUTANEOUS | 84 days supply | Qty: 6 | Fill #2

## 2023-12-27 NOTE — Unmapped (Signed)
 Family Medicine Office Visit    Assessment/Plan    Problem List Items Addressed This Visit          Digestive    Dysphagia - Primary    Patient reports history of chronic dysphagia for many years 2/2 esophageal stricture or structural abnormality necessitating dilation. Reports last EGD >10 years ago. No records available in Epic. With worsened chronic dysphagia with weight loss and progression to difficulty swallowing both liquids and solids, ordered EGD.  - Abd XR 12/12/2023 with nonobstructive bowel gas pattern with moderate to large volume of solid colonic stool. CXR 12/12/2023 normal.  - EGD 12/14/2023 with widely patent Schatzki ring was found at the GE junction. A large amount of food residue found in the entire examined stomach. The procedure was subsequently aborted. Plan for repeat EGD at [facility] main with CLD for the day prior to the exam. EGD 12/20/2023 with changes consistent with GERD. No H pylori, metaplasia or dysplasia.  - Ordered gastric emptying study for potential gastroparesis. Scheduled 01/10/2024. Concerned that he will not be able to complete due to cost.            # Screening and Prevention  - Colorectal Screening:  Brought records from [place] (scanned) Colonoscopy performed 2015 - three 2-5 mm hyperplastic polyps in the sigmoid colon, internal hemorrhoids, repeat 10 years (2025). Declines further screening.  - Hep C Ab:  Negative.  - HIV Ab:  Negative.    # Vaccines  - TDaP: 2017.  - pneumovax: 2016, 2017.  - seasonal influenza: Received.  - COVID:  Received 2023.    I personally spent 25 minutes face-to-face and non-face-to-face in the care of this patient, which includes all pre, intra, and post visit time on the date of service.       Subjective:    Chief Complaint   Patient presents with    Follow-up       History of Present Illness  Christopher [name] is a 73 year old male with acid reflux who presents with difficulty swallowing and gastrointestinal symptoms.    He has experienced difficulty swallowing for several years, with episodes of regurgitation after eating, even with small food portions. Previous interventions, including an endoscopy, showed no cancer or masses, only acid reflux, but the issue persists.    He recently underwent another endoscopy, which did not reveal any alarming findings. Since the procedure, he has been eating well, describing it as having 'flushed it out.' However, he continues to experience trouble swallowing, which causes anxiety.    Currently, he is not experiencing any pain, a change from previous visits where he reported throat pain. Today is the first day in months without pain.    Financial concerns are a barrier to undergoing a gastric emptying study that was scheduled for the 18th, as he states he 'can't afford it' due to financial constraints and household issues.      Physical Exam:  Vitals Reviewed:  BP 119/67 (BP Site: R Arm, BP Position: Sitting, BP Cuff Size: Medium)  - Pulse 70  - Temp 36.5 ??C (97.7 ??F) (Temporal)  - Ht 165.1 cm (5' 5) Comment: pt reported - Wt 75.3 kg (166 lb) Comment: pt reported - BMI 27.62 kg/m??    Weight:   Wt Readings from Last 12 Encounters:   12/28/23 75.3 kg (166 lb)   12/20/23 75.3 kg (166 lb)   12/18/23 77.7 kg (171 lb 3.2 oz)   12/14/23 76.2 kg (168 lb)  12/12/23 76.2 kg (168 lb)   10/18/23 79.5 kg (175 lb 3.2 oz)   09/25/23 80.5 kg (177 lb 6.4 oz)   09/06/23 76.7 kg (169 lb)   06/28/23 78.5 kg (173 lb)   05/17/23 82.1 kg (181 lb)   02/28/23 86.6 kg (191 lb)   02/27/23 86.7 kg (191 lb 3.2 oz)     General: well-nourished, no acute distress, pleasant.

## 2023-12-27 NOTE — Unmapped (Addendum)
 Patient reports history of chronic dysphagia for many years 2/2 esophageal stricture or structural abnormality necessitating dilation. Reports last EGD >10 years ago. No records available in Epic. With worsened chronic dysphagia with weight loss and progression to difficulty swallowing both liquids and solids, ordered EGD.  - Abd XR 12/12/2023 with nonobstructive bowel gas pattern with moderate to large volume of solid colonic stool. CXR 12/12/2023 normal.  - EGD 12/14/2023 with widely patent Schatzki ring was found at the GE junction. A large amount of food residue found in the entire examined stomach. The procedure was subsequently aborted. Plan for repeat EGD at [facility] main with CLD for the day prior to the exam. EGD 12/20/2023 with changes consistent with GERD. No H pylori, metaplasia or dysplasia.  - Ordered gastric emptying study for potential gastroparesis. Scheduled 01/10/2024. Concerned that he will not be able to complete due to cost.

## 2023-12-28 DIAGNOSIS — R1310 Dysphagia, unspecified: Principal | ICD-10-CM

## 2024-01-02 DIAGNOSIS — E1122 Type 2 diabetes mellitus with diabetic chronic kidney disease: Principal | ICD-10-CM

## 2024-01-02 DIAGNOSIS — E1159 Type 2 diabetes mellitus with other circulatory complications: Principal | ICD-10-CM

## 2024-01-02 DIAGNOSIS — I152 Hypertension secondary to endocrine disorders: Principal | ICD-10-CM

## 2024-01-02 DIAGNOSIS — N182 Chronic kidney disease, stage 2 (mild): Principal | ICD-10-CM

## 2024-01-02 NOTE — Progress Notes (Addendum)
 [facility] FAMILY MEDICINE [place] POPULATION HEALTH  Care Management Progress Note    Date: 01/02/2024  Outcome:  Phone outreach completed    Is this patient in [place] ? Yes    Purpose of contact:           Pt returned CM missed call. CM completed introduction for CCM and identified good call frequency for CCM check-ins.     Pt mentioned that sugars have been averaging about 120 and that BP has been good, with a general range of 120/67-80    Pt doesn't have any other concerns at this time.    Health Maintenance:  Health Maintenance Due   Topic Date Due    Zoster Vaccines (2 of 3) 06/16/2014    Colon Cancer Screening  07/09/2023    Retinal Eye Exam  11/14/2023    COVID-19 Vaccine (5 - 2025-26 season) 12/24/2023     Additional Information/Plan:  Patient provided my direct contact information and encouraged to contact me should additional needs arise.    Time Spent Per Day:  Chart review was completed prior to outreach attempt.   01/02/2024: 4      Leila Center, CMA  Population Health  [facility] FAMILY MEDICINE [place]

## 2024-01-02 NOTE — Unmapped (Signed)
 [facility] FAMILY MEDICINE [place] POPULATION HEALTH  Care Management Progress Note    Date: 01/02/2024  Outcome:  1st outreach attempt, left voicemail. CM will make further attempts at outreach.     Is this patient in [place] ? Yes    Purpose of contact:         Introductory Care Manager call for CCM, but CM could not reach Pt. CM left a vm and will try again next week.     Health Maintenance:  Health Maintenance Due   Topic Date Due    Zoster Vaccines (2 of 3) 06/16/2014    Colon Cancer Screening  07/09/2023    Retinal Eye Exam  11/14/2023    COVID-19 Vaccine (5 - 2025-26 season) 12/24/2023   Additional Information/Plan:  Patient provided my direct contact information and encouraged to contact me should additional needs arise.    Time Spent Per Day:  Chart review was completed prior to outreach attempt.   01/02/2024: 1    Leila Center, CMA  Population Health  [facility] FAMILY MEDICINE 

## 2024-01-09 DIAGNOSIS — I152 Hypertension secondary to endocrine disorders: Principal | ICD-10-CM

## 2024-01-09 DIAGNOSIS — N182 Chronic kidney disease, stage 2 (mild): Principal | ICD-10-CM

## 2024-01-09 DIAGNOSIS — E1122 Type 2 diabetes mellitus with diabetic chronic kidney disease: Principal | ICD-10-CM

## 2024-01-09 DIAGNOSIS — E1159 Type 2 diabetes mellitus with other circulatory complications: Principal | ICD-10-CM

## 2024-01-09 MED ORDER — METOPROLOL TARTRATE 25 MG TABLET
ORAL_TABLET | ORAL | 2 refills | 0.00000 days | Status: CP
Start: 2024-01-09 — End: ?

## 2024-01-09 MED ORDER — METFORMIN 1,000 MG TABLET
ORAL_TABLET | ORAL | 3 refills | 0.00000 days | Status: CP
Start: 2024-01-09 — End: ?

## 2024-01-09 MED ORDER — AMLODIPINE 10 MG TABLET
ORAL_TABLET | ORAL | 2 refills | 0.00000 days | Status: CP
Start: 2024-01-09 — End: ?

## 2024-01-09 NOTE — Unmapped (Signed)
 Patient is requesting the following refill  Requested Prescriptions     Pending Prescriptions Disp Refills    amlodipine  (NORVASC ) 10 MG tablet [Pharmacy Med Name: AMLODIPINE  BESYLATE 10 MG Oral Tablet] 90 tablet 3     Sig: TAKE 1 TABLET EVERY DAY FOR HIGH BLOOD PRESSURE    metoPROLOL  tartrate (LOPRESSOR ) 25 MG tablet [Pharmacy Med Name: METOPROLOL  TARTRATE 25 MG Oral Tablet] 180 tablet 3     Sig: TAKE 1 TABLET TWICE DAILY       Recent Visits  Date Type Provider Dept   12/28/23 Office Visit Claudene Burnard Cable, MD [facility] Family Medicine [place]   12/18/23 Office Visit Claudene Burnard Cable, MD [facility] Family Medicine [place]   12/12/23 Office Visit Claudene Burnard Cable, MD [facility] Family Medicine [place]   10/18/23 Office Visit Claudene Burnard Cable, MD [facility] Family Medicine [place]   09/06/23 Office Visit Margaree Spruce, MD [facility] Cardiology Primary Care [facility]   06/28/23 Office Visit Claudene Burnard Cable, MD [facility] [place] Family Medicine [place]   05/17/23 Office Visit Claudene Burnard Cable, MD [facility] Family Medicine [facility]   02/27/23 Office Visit [place], Harlene Caldron, [place] [facility] Cardiology Primary Care [facility]   01/10/23 Office Visit Claudene Burnard Cable, MD [place] Family Medicine [facility]   Showing recent visits within past 365 days and meeting all other requirements  Future Appointments  Date Type Provider Dept   04/01/24 Appointment Claudene Burnard Cable, MD [facility] Family Medicine [place]   06/09/24 Appointment Margaree Spruce, MD [place] Cardiology Primary Care [facility]   Showing future appointments within next 365 days and meeting all other requirements       Labs: CBC:   WBC (10*9/L)   Date Value   05/17/2023 6.6     HGB (g/dL)   Date Value   98/76/7974 13.6     Hemoglobin (g/dL)   Date Value   88/71/7976 9.4 (L)     HCT (%)   Date Value   05/17/2023 40.8     MCV (fL)   Date Value   05/17/2023 87.0     RDW (%)   Date Value   05/17/2023 13.7     Platelet (10*9/L)   Date Value   05/17/2023 216     Neutrophils % (%)   Date Value   05/17/2023 55.0 Lymphocytes % (%)   Date Value   05/17/2023 36.4     Monocytes % (%)   Date Value   05/17/2023 6.5     Eosinophils % (%)   Date Value   05/17/2023 1.6     Basophils % (%)   Date Value   05/17/2023 0.5    Creatinine:   Creatinine (mg/dL)   Date Value   91/79/7974 1.13    Potassium:   Potassium (mmol/L)   Date Value   12/12/2023 4.2     Potassium, Bld (mmol/L)   Date Value   03/21/2022 4.6    Sodium:   Sodium (mmol/L)   Date Value   12/12/2023 140     Sodium Whole Blood (mmol/L)   Date Value   03/21/2022 137

## 2024-01-19 DIAGNOSIS — I152 Hypertension secondary to endocrine disorders: Principal | ICD-10-CM

## 2024-01-19 DIAGNOSIS — E1159 Type 2 diabetes mellitus with other circulatory complications: Principal | ICD-10-CM

## 2024-01-19 MED ORDER — TROSPIUM 20 MG TABLET
ORAL_TABLET | Freq: Two times a day (BID) | ORAL | 3 refills | 90.00000 days
Start: 2024-01-19 — End: 2025-01-13

## 2024-01-19 MED ORDER — AMLODIPINE 10 MG TABLET
ORAL_TABLET | Freq: Every day | ORAL | 3 refills | 100.00000 days | Status: CP
Start: 2024-01-19 — End: ?

## 2024-01-21 MED ORDER — TROSPIUM 20 MG TABLET
ORAL_TABLET | Freq: Two times a day (BID) | ORAL | 3 refills | 90.00000 days | Status: CP
Start: 2024-01-21 — End: 2025-01-15

## 2024-01-29 ENCOUNTER — Ambulatory Visit
Admit: 2024-01-29 | Discharge: 2024-01-30 | Payer: Medicare (Managed Care) | Attending: Adult Health | Primary: Adult Health

## 2024-01-29 DIAGNOSIS — N3943 Post-void dribbling: Principal | ICD-10-CM

## 2024-01-29 DIAGNOSIS — N401 Enlarged prostate with lower urinary tract symptoms: Principal | ICD-10-CM

## 2024-01-29 MED ORDER — TAMSULOSIN 0.4 MG CAPSULE
ORAL_CAPSULE | Freq: Every day | ORAL | 3 refills | 90.00000 days | Status: CP
Start: 2024-01-29 — End: 2024-01-29

## 2024-01-29 MED ORDER — TROSPIUM 20 MG TABLET
ORAL_TABLET | Freq: Two times a day (BID) | ORAL | 3 refills | 90.00000 days | Status: CP
Start: 2024-01-29 — End: 2025-01-23

## 2024-01-29 NOTE — Unmapped (Signed)
-   increase sanctura  to twice daily (mark [place])   - continue flomax

## 2024-01-29 NOTE — Unmapped (Signed)
 Assessment:   Diagnosis ICD-10-CM Associated Orders   1. Other urinary incontinence / bph/ elevated psa N39.498 Urology   This is a 73 y.o. male with BPH, urge incontinence, previously worse at night, now more bothersome during the day, prior good response to vesicare, however, it contributed to dry mouth. Myrbetriq  wasn't covered by insurance.       Prior elevated psa in the setting of UTI.     Plan:  - Increase sanctura  to twice daily for incontinence. (reviewed side effects including dry mouth, constipation, urinary retention, confusion). He's going to stop the medication and contact us  should he develop any of these symptoms. Understands to go to the ED if he cannot urinate over a 6 hour period.   - continue flomax  medication  - psa today  (0.45 09/25/23)   - plan follow up in 12 months     Referring Provider:  Dr. Claudene    PCP:   Dr. Towana    Reason for Visit:  Incontinence    HPI:   Christopher [name]  is a pleasant 73 y.o. year-old male who is being seen today to follow up on incontinence.      Interval 01/29/24:    He returns today doing well on sanctura / flomax . He notes he's sleeping well. Better than he's ever slept. Great stream. States that he's now leaking with urge which has been worsening over the last year. He is now wearing a male pad; having to change every 4 hours.  Reports pharmacy didn't ever increase his sanctura  medication, so no real change in incontinence. No hematuria/ dysuria. Has some urgency/ frequency. No nocturia, doesn't leak during the day he states.     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 No recurrence of UTI. States he wants to bring me a [place]nut tree.     IPSS    Incomplete Emptying: 0  Frequency: 4  Intermittency: 2  Urgency: 3  Weak stream: 0  Straining: 0  Nocturia: 0    Total: 9  (0-7 mild, 8-19 moderate, 20-35 severe)    QOL: 1        PVR 0 mls      Interval 09/25/23:   He returns today doing well on sanctura / flomax . He notes he's sleeping well. Better than he's ever slept. Great stream. States that he's now leaking with urge which has been worsening over the last year. He is now wearing a male pad; having to change every 4 hours.  No hematuria/ dysuria. No nocturia, doesn't leak during the day he states.     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 No recurrence of UTI.       PVR 1 mls  UA 2 + glucose      Interval 08/21/22:   He returns today doing well on sanctura  flomax . He notes he's sleeping well. Better than he's ever slept. Great stream. No hematuria/ dysuria. Only mild urgency.     He did have open heart surgery last November. 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 No recurrence of UTI.     IPSS: 1/ QOL 0    PVR 0 mls       Interval 08/16/21:   He was previously prescribed sanctura  and flomax , but he's not sure if he's still taking these medications. Thinks he is currently.  He can sleep all night, without nocturia/ leakage, and is very pleased with this. Wears one pad throughout the day, but does have some urgency; this is unchanged.  Typically doesn't have accidents.  He does drink coffee/ pepsi, so likely these contribute to some urgency. He's also on spironolactone .     History of prior elevated psa in the setting of uti.     PVR 0 mls    Interval 04/23/20:  He is taking sanctura  and flomax  medication. He can sleep all night, without nocturia/ leakage, and is very pleased with this. Wears one pad throughout the day, but does have some urgency. Typically doesn't have accidents unless he waits too long to urinate. He does drink coffee/ pepsi, so likely these contribute to some urgency.     History of prior elevated psa in the setting of uti.     PVR 0 mls  UA trace protein/ ketones, 1+ bili     Regular bms.     Interval 02/2019:  He has started sanctura  in addition to his flomax  medication. He tells me everything is wonderful. Only requiring a security pad throughout the day he rarely has to change. Notes mild, but manageable dry mouth that is worse in the morning. He's now sleeping through the night without nocturia or leakage. Very pleased with medication. No complaints.       Initial visit with me:   Essentially, his problems are unchanged. Notes that he has some urge incontinence which is worst at night. He is wearing a pad, and plastic sheet underneath him. No leakage during the day, but does wear a security pad in case. Feels like sometimes he doesn't empty his bladder.     Plan was in 2018 to switch to myrbetriq  25mg , given severe dry mouth with vesicare, however he believes that myrbetriq  wasn't covered by his insurance.     Caffeine: 1 cup coffee/ day     BM:  Daily     PVR 12   UA trace blood    IPSS    Incomplete Emptying: 4  Frequency: 4  Intermittency: 4  Urgency: 5  Weak stream: 3  Straining: 1  Nocturia: 2    Total: 24  (0-7 mild, 8-19 moderate, 20-35 severe)    QOL: 5          PSA 0.9 (2018). Does have history of one elevated psa level in the setting of UTI.     Cysto 2018:    Cystoscopy (male)  Time out was performed immediately prior to the procedure.     The patient was prepped and draped in the usual sterile fashion.  Flexible cystosopy was performed.  The urethra was normal.  Prostatic urethra showed normal prostate with very mild right lateral and anterior lobe enlargement, no median lobe, no significant obstruction noted.  The bladder urothelium was without abnormality.  The ureteral orifices were visualized bilaterally, and efflux was clear.  Saline barbotage was not performed for cytology.  The patient tolerated the procedure well and was given one dose of antibiotic prophylaxis afterwards.    Today, he reports that he is doing well. Today, he denies fever, chills, chest pain, shortness of breath, cough, wheezing, nausea, vomiting, abdominal pain, flank pain, diarrhea, constipation, dysuria, hematuria, rashes, open wounds, edema, and weakness or numbness in the upper or lower extremities.     Past medical history:  Past Medical History: Diagnosis Date    Acute kidney injury 01/12/2018    Anemia     IRON DEFICIENT     Anxiety dont know    Arthritis     Basal cell carcinoma     Cataract  CHF (congestive heart failure) (CMS-HCC)     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    Corneal abrasion 1975    steel in right eye with abrasion and removal    Cubital tunnel syndrome on left 01/16/2019    Depression 2016    Diabetes mellitus (CMS-HCC)     Dysphagia     GERD (gastroesophageal reflux disease) 1982    Hypertension     Meningitis (HHS-HCC) 12/25/2015    Mixed hyperlipidemia     Myocardial infarction (CMS-HCC) 1986    Obesity 1986    first back surgery    Peptic ulceration        Past surgical history:  Past Surgical History:   Procedure Laterality Date    BACK SURGERY      CARDIAC CATHETERIZATION      STENTS PLACEMENT     CARDIAC SURGERY  2005    stents    CORONARY STENT PLACEMENT      ESOPHAGEAL DILATION      x 2    FOREIGN BODY REMOVAL Right about 20 years ago    HERNIA REPAIR      RIGHT INGUNIAL    JOINT REPLACEMENT Left     TOTAL SHOULDER    PR ARM/ELBOW TENDON LENGTHEN,SINGLE,EA Left 02/17/2019    Procedure: TENDON LENGTHENING UPPER ARM/ELBOW SNGL EA;  Surgeon: Delon Duwaine Terra Jakie, MD;  Location: ASC OR [facility];  Service: Orthopedics    PR CABG, ARTERIAL, SINGLE Midline 03/20/2022    Procedure: CORONARY ARTERY BYPASS, USING ARTERIAL GRAFT(S); SINGLE ARTERIAL GRAFT;  Surgeon: Ranelle Debby Bare, MD;  Location: MAIN OR [facility];  Service: Cardiac Surgery    PR CABG, ARTERY-VEIN, SINGLE Midline 03/20/2022    Procedure: CORONARY ARTERY BYPASS, USING VENOUS GRAFT(S) AND ARTERIAL GRAFT(S); SINGLE VEIN GRAFT;  Surgeon: Ranelle Debby Bare, MD;  Location: MAIN OR [facility];  Service: Cardiac Surgery    PR CATH PLACE/CORON ANGIO, IMG SUPER/INTERP,W LEFT HEART VENTRICULOGRAPHY N/A 02/01/2022    Procedure: Left Heart Catheterization;  Surgeon: Pia Sharper, MD;  Location: [facility] CATH;  Service: Cardiology    PR ENDOSCOPY W/VIDEO-ASST VEIN HARVEST,CABG Right 03/20/2022    Procedure: ENDOSCOPY, SURGICAL, INCLUDING VIDEO-ASSISTED HARVEST OF VEIN(S) FOR CORONARY ARTERY BYPASS PROCEDURE;  Surgeon: Ranelle Debby Bare, MD;  Location: MAIN [facility];  Service: Cardiac Surgery    PR OSTEOTOMY 1ST METATARSAL,BASE/SHAFT Left 07/20/2017    Procedure: OSTEOTOMY, W/WO LENGTHENING, SHORTENING OR ANGULAR CORRECTION, METATARSAL; FIRST METATARSAL;  Surgeon: Fonda Rosalynn Cooler, MD;  Location: ASC OR [facility];  Service: Ortho Foot & Ankle    PR OSTEOTOMY HEEL BONE Left 07/20/2017    Procedure: OSTEOTOMYMERL MC W/WO INT FIXA;  Surgeon: Fonda Rosalynn Cooler, MD;  Location: ASC OR [facility];  Service: Ortho Foot & Ankle    PR OSTEOTOMY METATARSAL (NOT 1ST) Left 06/06/2018    Procedure: OSTEOTOMY, W/WO LENGTHENING, SHORTENING OR ANGULAR CORRECTION, METATARSAL; OTHER THAN FIRST METATARSAL, EA;  Surgeon: Fonda Rosalynn Cooler, MD;  Location: ASC OR [facility];  Service: Orthopedics    PR PART REMV PHALANX OF TOE Left 06/06/2018    Procedure: PART EXC BONE PHALANX TOE;  Surgeon: Fonda Rosalynn Cooler, MD;  Location: ASC OR [facility];  Service: Orthopedics    PR RECONSTR TOTAL SHOULDER IMPLANT Left 11/12/2018    Procedure: R28   ARTHROPLASTY, GLENOHUMERAL JOINT; TOTAL SHOULDER(GLENOID & PROXIMAL HUMERAL REPLACEMENT(EG, TOTAL SHOULDER);  Surgeon: Lamar Marsa Hamming, MD;  Location: [facility] OR [facility];  Service: Orthopedics    PR REMOVAL IMPLANT DEEP Left 11/12/2018  Procedure: REMOVE IMPLANT; DEEP SHOULDER;  Surgeon: Lamar Marsa Hamming, MD;  Location: [facility] OR [facility];  Service: Orthopedics    PR REPAIR BICEPS LONG TENDON Left 11/12/2018    Procedure: TENODESIS LONG TENDON BICEPS;  Surgeon: Lamar Marsa Hamming, MD;  Location: [facility] OR [facility];  Service: Orthopedics    PR REPAIR PERONEAL TENDONS,FIB OSTEOTMY Left 07/20/2017    Procedure: REPR DISLOC PERONEAL TENDONS; W/FIB OSTEOTOMY;  Surgeon: Fonda Rosalynn Cooler, MD;  Location: ASC OR [facility];  Service: Ortho Foot & Ankle    PR REVISE MEDIAN N/CARPAL TUNNEL SURG Left 02/17/2019    Procedure: R22 NEUROPLASTY AND/OR TRANSPOSITION; MEDIAN NERVE AT CARPAL TUNNEL;  Surgeon: Delon Bouchard Terra Gander, MD;  Location: ASC OR [facility];  Service: Orthopedics    PR REVISE ULNAR NERVE AT ELBOW Left 02/17/2019    Procedure: NEUROPLASTY AND/OR TRANSPOSITION; ULNAR NERVE AT ELBOW;  Surgeon: Delon Bouchard Terra Gander, MD;  Location: ASC OR [facility];  Service: Orthopedics    PR UP GI ENDOSCOPY,BALL DIL,30MM N/A 12/20/2023    Procedure: UGI ENDO; W/BALLOON DILAT ESOPHAGUS (<30MM DIAM);  Surgeon: Audrey Lacinda Hun, MD;  [facility]: [facility];  Service: Gastroenterology    PR UPPER GI ENDOSCOPY,BIOPSY N/A 12/14/2023    Procedure: UGI ENDOSCOPY; WITH BIOPSY, SINGLE OR MULTIPLE;  Surgeon: Celestina Comer Norris, MD;  [facility]: [facility];  Service: Gastroenterology    PR UPPER GI ENDOSCOPY,BIOPSY N/A 12/20/2023    Procedure: UGI ENDOSCOPY; WITH BIOPSY, SINGLE OR MULTIPLE;  Surgeon: Audrey Lacinda Hun, MD;  [facility]: [facility];  Service: Gastroenterology    SHOULDER SURGERY Bilateral     L TSA, R ROTATOR CUFF      SKIN BIOPSY      SPINE SURGERY  1986/1995/2000/2002    LUMBAR FUSION    stent         Social history:  Social History     Social History Narrative    Not on file       Family history:  family history includes Alcohol abuse in his father; COPD in his brother, brother, and sister; Cancer in his father and maternal grandmother; Cataracts in his father, mother, and paternal grandfather; Diabetes in his brother, father, mother, sister, and sister; Early death in his brother; Hypertension in his father, maternal grandfather, maternal grandmother, mother, paternal grandfather, and paternal grandmother; Kidney disease in his maternal aunt; Melanoma in his father; No Known Problems in his maternal uncle, paternal aunt, paternal uncle, and another family member; Stroke in his mother.    MEDICATIONS:   Current Outpatient Medications   Medication Sig Dispense Refill    amlodipine  (NORVASC ) 10 MG tablet Take 1 tablet (10 mg total) by mouth daily. 100 tablet 3    aspirin  (ECOTRIN) 81 MG tablet Take 1 tablet (81 mg total) by mouth daily.      blood glucose control, low (TRUE METRIX LEVEL 1) Soln USE FOR GLUCOMETER MACHINE AS DIRECTED 1 each 3    blood-glucose meter kit Use as instructed. 1 each 0    evolocumab  (REPATHA  SURECLICK) 140 mg/mL PnIj Inject the contents of one pen (140 mg) under the skin every fourteen (14) days. 6 mL 3    fluticasone  propionate (FLONASE ) 50 mcg/actuation nasal spray 2 sprays into each nostril daily. 48 mL 3    gabapentin  (NEURONTIN ) 300 MG capsule Take 2 capsules (600 mg total) by mouth two (2) times a day. 180 capsule 3    JARDIANCE  10 mg tablet TAKE  ONE TABLET BY MOUTH DAILY 90 tablet 2    metFORMIN  (GLUCOPHAGE ) 1000 MG tablet TAKE 1 TABLET EVERY MORNING AND TAKE 1 TABLET EVERY EVENING (TAKE WITH MEALS) 180 tablet 3    metoPROLOL  tartrate (LOPRESSOR ) 25 MG tablet TAKE 1 TABLET TWICE DAILY 180 tablet 2    omeprazole  (PRILOSEC) 20 MG capsule TAKE 1 CAPSULE (20 MG TOTAL) BY MOUTH DAILY. 90 capsule 3    sertraline  (ZOLOFT ) 100 MG tablet Take 1 tablet (100 mg total) by mouth daily. 90 tablet 1    tamsulosin  (FLOMAX ) 0.4 mg capsule Take 1 capsule (0.4 mg total) by mouth daily. 90 capsule 3    traZODone  (DESYREL ) 150 MG tablet Take 1 tablet (150 mg total) by mouth nightly. 90 tablet 3    trospium  (SANCTURA ) 20 mg tablet Take 1 tablet (20 mg total) by mouth two (2) times a day. 180 tablet 3    TRUE METRIX GLUCOSE TEST STRIP Strp CHECK BLOOD SUGAR AS DIRECTED ONCE A DAY AND FOR SYMPTOMS OF HIGH OR LOW BLOOD SUGAR. 100 strip 3    TRUEPLUS LANCETS 33 gauge Misc TEST BLOOD SUGAR EVERY DAY 100 each 3     No current facility-administered medications for this visit.       Allergies:  Allergies   Allergen Reactions    Morphine Hives    Atorvastatin      neuropathy Atorvastatin Calcium       neuropathy    Baclofen      headache  headache    Ibuprofen      GI bleed    Pneumococcal 23-Valent Polysaccharide Vaccine Other (See Comments)     Redness and pain at injection site. Fatigue  Pt stated his arm swelled within 24hrs after injection    Zetia [Ezetimibe] Other (See Comments)     Pt states it effected his brain  Pt stated it effected his sight, I couldn't drive a car at all       Review of Systems:  Positive for arthritis.  Otherwise, a 10 system ROS questionnaire completed by the patient and reviewed by me was normal.    Results for MAEOLA ODIS JOHNIE TEDDIE (MRN [id number]) as of 04/05/2020 14:02   Ref. Range 01/09/2019 11:23   Prostate Specific Antigen Latest Ref Range: 0.00 - 4.00 ng/mL 0.47      Latest Reference Range & Units 05/18/20 09:42   Prostate Specific Antigen 0.00 - 4.00 ng/mL 0.51       Physical Exam  Vitals:    01/29/24 1110   BP: 129/74   BP Position: Sitting   Pulse: 63   Weight: 78.6 kg (173 lb 3.2 oz)   Height: 165.1 cm (5' 5)       GENERAL: The patient is a pleasant, male in no acute distress.   HEENT: Normocephalic and atraumatic.   NECK: Supple with trachea midline.   LYMPHATICS: No cervical or supraclavicular lymphadenopathy.   PULMONARY: Relaxed respiratory effort on room air.   CARDIOVASCULAR: Regular rate   GASTROINTESTINAL: Soft, nontender and nondistended     SKIN: No signs of cyanosis or clubbing.   NEUROLOGICAL: Grossly intact.   PSYCH: Alert and oriented x 3.     GENITOURINARY2024 Circumcised penis with bilaterally descended testicles. No testicular masses or tenderness.  Digital rectal exam reveals a 30 g enlarged prostate that is firm, smooth and symmetric without nodules or tenderness.

## 2024-02-05 NOTE — Unmapped (Signed)
 Copied from CRM #[id number]. Topic: Access To Clinicians - Req Clinic Call Back  >> Feb 05, 2024  9:31 AM Ashli A wrote:  Pt called stating [facility] said they are having trouble with receiving electronic prescriptions and would like to know if the provider can call in the medication over the phone. Please advise.     Does the caller want to be contacted regarding this request? Yes. Please contact The patient by Cell Phone Telephone Information:  Mobile          [phone]      Routine callback turnaround time: 24-48 business hours. Programmer, systems Notified)

## 2024-02-05 NOTE — Unmapped (Signed)
 [facility] FAMILY MEDICINE [place] POPULATION HEALTH  Care Management Progress Note    Date: 02/05/2024  Outcome:  Phone outreach completed    Is this patient in [place] ? Yes    Purpose of contact:         CM called Pt. for monthly CCM check in:  (1) Urine incontinence/Sanctura : Pt. has started using Sanctura  twice a day as encouraged and reports that it has been helping  (2) BP Management: Pt reports that he checks his BP every 2-3 days. His most recent reading was 114/75 and Pt. confirms that he has not been experiencing any symptoms of high or low BP  (3) Blood Sugar Management: Pt. reports that he checks his sugars regularly and that his average reading as of recent has been around 98. Pt. reports that he is trying to work to make extra money and so he usually skips lunches and only eats 1 meal in the morning and 1 meal at night. Pt. rarely eats snacks throughout the day but says that he does not feel any symptoms of low or high blood sugar despite not eating lunch.   (4) SDOH Concerns: Pt. does not report having any SDOH related concerns and declines needing resources at this time.    (5) Patient Concerns: Pt.'s primary concern is in regards to his [facility] going out of network with his PCP and losing insurance coverage for his medications. Pt. expressed that he would really like to keep Dr. Claudene as his PCP and is considering [facility] if they would be in network with his PCP. CM will follow up with Pt. once resources/dotpharse becomes available.     Provider/Care Partner(s) to follow up on:   Pt.  notes that he usually skips lunches due to work. His blood sugar has been an average of 98. Should CM encourage him to start eating lunch, or is it ok for him to skip since his blood sugar average is within normal range?     Health Maintenance:  Health Maintenance Due   Topic Date Due    Zoster Vaccines (2 of 3) 06/16/2014    Colon Cancer Screening  07/09/2023    Retinal Eye Exam  11/14/2023 COVID-19 Vaccine (5 - 2025-26 season) 12/24/2023   Additional Information/Plan:  Patient provided my direct contact information and encouraged to contact me should additional needs arise.    Time Spent Per Day:  Chart review was completed prior to outreach attempt.   02/05/2024: 10    Leila Center, CMA  Population Health  [facility] FAMILY MEDICINE [place]

## 2024-02-06 NOTE — Unmapped (Signed)
 This RN called [facility] and they stated that they did not receive a new prescription from October but that he had 4 refills left on the medication. Patient stated that the issue was that Sonny now wants him to take 2 pills a day. He stated he was going to call the pharmacy today and I asked him to let us  know if he has anymore issues.

## 2024-02-07 DIAGNOSIS — I152 Hypertension secondary to endocrine disorders: Principal | ICD-10-CM

## 2024-02-07 DIAGNOSIS — E1159 Type 2 diabetes mellitus with other circulatory complications: Principal | ICD-10-CM

## 2024-02-07 DIAGNOSIS — E1122 Type 2 diabetes mellitus with diabetic chronic kidney disease: Principal | ICD-10-CM

## 2024-02-07 DIAGNOSIS — N182 Chronic kidney disease, stage 2 (mild): Principal | ICD-10-CM

## 2024-02-07 NOTE — Unmapped (Signed)
 Patient stated that he was able to get in touch with [facility] who originally did not have the prescription but were able to find it and fill the prescription for him. I let him know to call us  back if he has any more issues.

## 2024-02-07 NOTE — Unmapped (Signed)
 [facility] FAMILY MEDICINE [place] POPULATION HEALTH  Care Management Progress Note    Date: 02/07/2024  Outcome:  Phone outreach completed    Is this patient in [place] ? Yes    Purpose of contact:         CM called Pt. to follow up on concern from previous CCM call. Per PCP, CM encouraged Pt. to try to eat lunch regularly. Pt. expressed understanding and says he should be able to do so.     Pt. still expresses concern about [employer] going out of network with his current PCP. Pt. has spoken with [place], but is worried about the copay for seeing a speciality doctors ($45/visit). Pt expresses that this cost barrier would prevent him from seeing the doctor as often as he needs to. CM will follow up with Pt. as more information on insurance becomes available     Health Maintenance:  Health Maintenance Due   Topic Date Due    Zoster Vaccines (2 of 3) 06/16/2014    Colon Cancer Screening  07/09/2023    Retinal Eye Exam  11/14/2023    COVID-19 Vaccine (5 - 2025-26 season) 12/24/2023     Additional Information/Plan:  Patient provided my direct contact information and encouraged to contact me should additional needs arise.    Time Spent Per Day:  Chart review was completed prior to outreach attempt.   02/07/2024: 4    Leila Center, CMA  Population Health  [facility] FAMILY MEDICINE [place]

## 2024-02-08 DIAGNOSIS — N182 Chronic kidney disease, stage 2 (mild): Principal | ICD-10-CM

## 2024-02-08 DIAGNOSIS — E1159 Type 2 diabetes mellitus with other circulatory complications: Principal | ICD-10-CM

## 2024-02-08 DIAGNOSIS — I152 Hypertension secondary to endocrine disorders: Principal | ICD-10-CM

## 2024-02-08 DIAGNOSIS — E1122 Type 2 diabetes mellitus with diabetic chronic kidney disease: Principal | ICD-10-CM

## 2024-02-08 NOTE — Unmapped (Signed)
 [facility] FAMILY MEDICINE [place] POPULATION HEALTH  Care Management Progress Note    Date: 02/08/2024  Outcome:  1st outreach attempt, left voicemail. CM will make further attempts at outreach.     Is this patient in [place] ? Yes    Purpose of contact:         CM called Pt. to follow up on concerns about Pt.'s insurance going out of network with [facility]. Pt. informed CM that he already decided to go through with [employer], but would appreciate the Chapter contact info. CM shared the Chapter contact info with Pt. via MyChart.    Information for Pt. RE insurance changes  [place] is going out of network with [facility] unless Pt. has the [place] plan through [facility]), in which case, nothing is changing  If not [facility], Pt. will have access to [facility] through 04/23/2024  To stay in-network with [facility] provider in 2026, Pt. must switch to [facility] OR choose a different [place] plan that includes [facility] during the annual enrollment period (02/06/24-03/30/24)  If Pt. would like to speak to someone for guiadance, Pt. can call Chapter (via [facility]) to find a plan that will keep her in-network with [facility]. The number for Chapter is [phone] OR toll-free number: [phone]    Health Maintenance:  Health Maintenance Due   Topic Date Due    Zoster Vaccines (2 of 3) 06/16/2014    Colon Cancer Screening  07/09/2023    Retinal Eye Exam  11/14/2023    COVID-19 Vaccine (5 - 2025-26 season) 12/24/2023     Additional Information/Plan:  Patient provided my direct contact information and encouraged to contact me should additional needs arise.    Time Spent Per Day:  Chart review was completed prior to outreach attempt.   02/08/2024: 2    Leila Center, CMA  Population Health  [facility] FAMILY MEDICINE [place]

## 2024-03-04 DIAGNOSIS — D492 Neoplasm of unspecified behavior of bone, soft tissue, and skin: Principal | ICD-10-CM

## 2024-03-04 DIAGNOSIS — Z1283 Encounter for screening for malignant neoplasm of skin: Principal | ICD-10-CM

## 2024-03-04 DIAGNOSIS — L578 Other skin changes due to chronic exposure to nonionizing radiation: Principal | ICD-10-CM

## 2024-03-04 DIAGNOSIS — L821 Other seborrheic keratosis: Principal | ICD-10-CM

## 2024-03-04 DIAGNOSIS — L570 Actinic keratosis: Principal | ICD-10-CM

## 2024-03-04 NOTE — Patient Instructions (Addendum)
 Meet your team:     Your intake nurse is: Bernice     Please remember to fill out the survey you will receive after your visit. Your comments help us  continue to improve our care.      Thanks in advance!      [facility] Clinical    Shave biopsy   A shave biopsy involves numbing a small area of your skin and then obtaining a sample to help us  with proper diagnosis or skin condition. Biopsy results typically return in 7 to 14 days.    To care for the area: Leave the bandage in place until the morning after your procedure is performed. On a daily basis, carefully remove the bandage, then shower or wash as usual. Allow water  to run over the site. Please do not scrub. Carefully dry the area, then apply ointment (some people develop an allergy to Neosporin, so we recommend Vaseline orAquaphor). Cover the site with a fresh bandage. Should any bleeding occur, apply firm pressure for 15 minutes. The treated site will heal best if  a scab never forms (the wound heals by new skin cells traveling from the outside toward the middle-their journey is easier if no scab stands in their way).    Long-term care: the site will be more sensitive than your surrounding skin. Keep it covered, and remember to apply sunscreen every day to all your exposed skin. A scar may remain which is lighter or pinker than your normal skin. Your body will continue to improve your scar for up to one year.    Infection following this procedure is rare. However, if you are worried about the appearance of your site, contact your doctor. Complete healing may take up to one month. We have a physician on call at all times. If you have any concerns about the site, please call our clinic at [phone]  Staff      Cryosurgery  Cryosurgery (???freezing???) uses liquid nitrogen to destroy certain types of skin lesions. Lowering the temperature of the lesion in a small area surrounding skin destroys the lesion. Immediately following cryosurgery, you will notice redness and swelling of the treatment area. Blistering or weeping may occur, lasting approximately one week which will then be followed by crusting. Most areas will heal completely in 10 to 14 days.    Wash the treated areas daily. Allow soap and water  to run over the areas, but do not scrub. Should a scab or crust form, allow it to fall off on its own. Do not remove or pick at it. Application of an ointment  and a bandage may make you feel more comfortable, but it is not necessary. Some people develop an allergy to Neosporin, so we recommend that Vaseline or  Aquaphor be used.    The cryotherapy site will be more sensitive than your surrounding skin. Keep it covered, and remember to apply sunscreen every day to all your sun exposed skin. A scar may remain which is lighter or pinker than your normal skin. Your body will continue to improve your scar for up to one year; however a light-colored scar may remain.    Infection following cryotherapy is rare. However if you are worried about the appearance of the treated area, contact your doctor. We have a physician on call at all times. If you have any concerns about the site, please call our clinic at [phone]     Skin Cancer Prevention: Care Instructions  Your Care Instructions  Skin cancer is the abnormal growth of cells in the skin. It usually appears as a growth that changes in color, shape, or size. This can be a sore that does not heal or a change in a wart or a mole. Skin cancer is almost always curable when found early and treated. So it is important to see your doctor if you have any of these changes in your skin.  Skin cancer is the most common type of cancer. It often appears on areas of the body that have been exposed to the sun, such as the head, face, neck, back, chest, or shoulders.  Follow-up care is a key part of your treatment and safety. Be sure to make and go to all appointments, and call your doctor if you are having problems. It's also a good idea to know your test results and keep a list of the medicines you take.  How can you care for yourself at home?  Wear a wide-brimmed hat and long sleeves and pants if you are going to be outdoors for a long time.  Avoid the sun between 10 a.m. and 4 p.m., which is the peak time for UV rays.  Wear sunscreen on exposed skin. Make sure to use a broad-spectrum sunscreen that has a sun protection factor (SPF) of 30 or higher. Use it every day, even when it is cloudy.  Do not use tanning booths or sunlamps.  Use lip balm or cream that has sun protection factor (SPF) to protect your lips from getting sunburned.  Wear sunglasses that block UV rays.  When should you call for help?   Call your doctor now or seek immediate medical care if:    You have signs of infection, such as:  Increased pain, swelling, warmth, or redness.  Red streaks leading from the area.  Pus draining from the area.  A fever.   Watch closely for changes in your health, and be sure to contact your doctor if:    You see a change in your skin, such as a growth or mole that:  Grows bigger. This may happen very slowly.  Changes color.  Changes shape.  Starts to bleed easily.     You have swollen glands in your armpits, groin, or neck.     You do not get better as expected.   Where can you learn more?  Go to [facility] at https://myuncchart.org  Select Patient Education under [employer]. Enter P392 in the search box to learn more about Skin Cancer Prevention: Care Instructions.  Current as of: April 10, 2019               Content Version: 12.8  ?? 2006-2021 [employer]wise, [employer].   Care instructions adapted under license by [facility]. If you have questions about a medical condition or this instruction, always ask your healthcare professional. Healthwise, Incorporated disclaims any warranty or liability for your use of this information.

## 2024-03-04 NOTE — Progress Notes (Signed)
 DERMATOLOGY CLINIC NOTE    ASSESSMENT AND PLAN:     Actinic Keratoses:   - After R/B/A discussed (including scarring, pigment alteration, recurrence, or persistence of the lesion) and verbal consent was obtained, identified  lesions were treated with cryotherapy x 1 ten second freeze-thaw cycle. The patient tolerated the procedure well and was instructed on post-cryotherapy wound care.   Total AK's frozen: >15  Location and number: Scalp, face, forehead, arms    Benign lesions (seborrheic keratoses, nevi, lentigines):  -Reassurance provided.  -Discussed good photoprotective practices.  Recommended sunscreen with SPF.  -Discussed make us  aware should lesions grow or change significantly and also discussed other worrisome findings such as bleeding.    Skin neoplasm, left dorsal hand, shave biopsy:  - Differential includes prurigo nodularis  - Biopsy of lesion(s) in question  was performed in typical fashion using 2% lidocaine  with epinephrine  and aluminum chloride for hemostasis.   - Dermatopathology Order    Return to clinic: 6 months       CHIEF COMPLAINT:       HPI:   This is a pleasant 73 y.o. male who last saw me on 08/14/2023.  At that time we froze many actinic keratoses.  He comes in today to recheck actinic keratoses and also for lesion of concern on his hand.    No other concerns today.  We had seen him before for an intertrigo but he says this is doing fine and does not want to recheck this.     PAST MEDICAL HISTORY:  History of 5 different operations in lumbar spine, most recently in 2005  No known history of skin cancer     Hypertension  Hyperlipidemia  Obesity  Depression  Coronary artery disease  Type 2 diabetes        MEDICATIONS:   Medications Ordered Prior to Encounter[1]    ALLERGIES:   Reviewed in epic    SOCIAL HISTORY:  Lives in [place]  Works in farming     REVIEW OF SYSTEMS:  Baseline state of health. No recent illnesses. No other skin complaints.     PHYSICAL EXAMINATION:  Examination in the presence of chaperone:  General: Well-developed, well-nourished. No acute distress.   Neuro: Alert and oriented, answers questions appropriately.  Skin: Examination of the scalp, face, head, neck, chest, back, upper extremities, lower extremities, hands, palms, soles was performed and notable for the following:       Dictation software was used while making this note. Please excuse any errors made with dictation software.       [1]   Current Outpatient Medications on File Prior to Visit   Medication Sig Dispense Refill    amlodipine  (NORVASC ) 10 MG tablet Take 1 tablet (10 mg total) by mouth daily. 100 tablet 3    aspirin  (ECOTRIN) 81 MG tablet Take 1 tablet (81 mg total) by mouth daily.      blood glucose control, low (TRUE METRIX LEVEL 1) Soln USE FOR GLUCOMETER MACHINE AS DIRECTED 1 each 3    blood-glucose meter kit Use as instructed. 1 each 0    evolocumab  (REPATHA  SURECLICK) 140 mg/mL PnIj Inject the contents of one pen (140 mg) under the skin every fourteen (14) days. 6 mL 3    fluticasone  propionate (FLONASE ) 50 mcg/actuation nasal spray 2 sprays into each nostril daily. 48 mL 3    gabapentin  (NEURONTIN ) 300 MG capsule Take 2 capsules (600 mg total) by mouth two (2) times a day. 180 capsule  3    JARDIANCE  10 mg tablet TAKE ONE TABLET BY MOUTH DAILY 90 tablet 2    metFORMIN  (GLUCOPHAGE ) 1000 MG tablet TAKE 1 TABLET EVERY MORNING AND TAKE 1 TABLET EVERY EVENING (TAKE WITH MEALS) 180 tablet 3    metoPROLOL  tartrate (LOPRESSOR ) 25 MG tablet TAKE 1 TABLET TWICE DAILY 180 tablet 2    omeprazole  (PRILOSEC) 20 MG capsule TAKE 1 CAPSULE (20 MG TOTAL) BY MOUTH DAILY. 90 capsule 3    sertraline  (ZOLOFT ) 100 MG tablet Take 1 tablet (100 mg total) by mouth daily. 90 tablet 1    tamsulosin  (FLOMAX ) 0.4 mg capsule Take 1 capsule (0.4 mg total) by mouth daily. 90 capsule 3    traZODone  (DESYREL ) 150 MG tablet Take 1 tablet (150 mg total) by mouth nightly. 90 tablet 3    trospium  (SANCTURA ) 20 mg tablet Take 1 tablet (20 mg total) by mouth two (2) times a day. 180 tablet 3    TRUE METRIX GLUCOSE TEST STRIP Strp CHECK BLOOD SUGAR AS DIRECTED ONCE A DAY AND FOR SYMPTOMS OF HIGH OR LOW BLOOD SUGAR. 100 strip 3    TRUEPLUS LANCETS 33 gauge Misc TEST BLOOD SUGAR EVERY DAY 100 each 3     No current facility-administered medications on file prior to visit.
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
## 2024-03-17 DIAGNOSIS — I2510 Atherosclerotic heart disease of native coronary artery without angina pectoris: Principal | ICD-10-CM

## 2024-03-17 DIAGNOSIS — Z789 Other specified health status: Principal | ICD-10-CM

## 2024-03-17 DIAGNOSIS — I209 Angina pectoris, unspecified: Principal | ICD-10-CM

## 2024-03-17 DIAGNOSIS — E785 Hyperlipidemia, unspecified: Principal | ICD-10-CM

## 2024-03-24 NOTE — Telephone Encounter (Signed)
 Pt reports medication is too expensive even with insurance. Pt is requesting an alternative medication.

## 2024-03-24 NOTE — Telephone Encounter (Signed)
 Copied from CRM #[id number]. Topic: Access To Clinicians - Medication Question  >> Mar 24, 2024 12:34 PM Dena MATSU wrote:  They need assistance with their medication(s). Alternate medication due to insurance/cost.    Medication Name(s): evolocumab  (REPATHA  SURECLICK) 140 mg/mL PnIj     Pharmacy: [facility]  [address] [place] [address]  Phone: [phone]  Fax: [phone]  Pharmacy Comments: [employer] covers 84ds // [place] approved 04/25/23 - 04/23/24 - Mylene // Grant approved 02/27/23 - 02/26/24 //      Please contact The patient by Cell Phone Telephone Information:  Mobile          [phone]   in regards to this request.    Coverage: yes, coverage is accurate on file.    Medication request turnaround time: 72 business hours. Programmer, Systems Notified)

## 2024-03-25 ENCOUNTER — Ambulatory Visit: Admit: 2024-03-25 | Discharge: 2024-03-26 | Payer: Medicare (Managed Care) | Attending: Family | Primary: Family

## 2024-03-25 ENCOUNTER — Inpatient Hospital Stay: Admit: 2024-03-25 | Discharge: 2024-03-26 | Payer: Medicare (Managed Care)

## 2024-03-25 DIAGNOSIS — M79642 Pain in left hand: Principal | ICD-10-CM

## 2024-03-25 DIAGNOSIS — E1122 Type 2 diabetes mellitus with diabetic chronic kidney disease: Principal | ICD-10-CM

## 2024-03-25 DIAGNOSIS — M1812 Unilateral primary osteoarthritis of first carpometacarpal joint, left hand: Principal | ICD-10-CM

## 2024-03-25 DIAGNOSIS — N182 Chronic kidney disease, stage 2 (mild): Principal | ICD-10-CM

## 2024-03-25 NOTE — Progress Notes (Signed)
 SPORTS MEDICINE RETURN VISIT    ASSESSMENT AND PLAN      Diagnosis ICD-10-CM Associated Orders   1. Arthritis of carpometacarpal (CMC) joint of left thumb  M18.12 Orthopedics DME Order      2. 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  Discussed possibility for steroid injection declined today.  - Fitted with a brace to stabilize and warm the thumb joint.  - Referred to Dr. Jakie for surgical consultation    Return for Dr. Jakie left hand eval.    Procedure(s):  none      SUBJECTIVE     Chief Complaint: No chief complaint on file.      73 y.o. male     History of Present Illness  Christopher [name] is a 73 year old male with arthritis who presents with worsening L hand pain.    He has been experiencing L hand pain for an extended period, which has progressively worsened. The pain is primarily located in base of the thumb and radiates into the fingers, causing significant difficulty with gripping and holding objects. He states, 'I can't hold anything right now.'    Being right-handed, he finds that activities such as turning a bottle or lifting objects exacerbate the pain. The most tender spot is at the tip of the thumb, and certain areas are intolerable to touch due to nerve irritation from previous surgery.    He has a history of hand surgery, after which the nerves in the area did not return to normal, resulting in persistent sensitivity.    He is the primary caregiver for his wife, who is in poor health, and emphasizes the necessity of having functional use of his hand to fulfill his caregiving responsibilities.      Past Medical History: Past Medical History[1]      OBJECTIVE     Physical Exam:  Vitals:   Wt Readings from Last 3 Encounters:   01/29/24 78.6 kg (173 lb 3.2 oz)   12/28/23 75.3 kg (166 lb)   12/20/23 75.3 kg (166 lb)     Estimated body mass index is 28.82 kg/m?? as calculated from the following:    Height as of 01/29/24: 165.1 cm (5' 5).    Weight as of 01/29/24: 78.6 kg (173 lb 3.2 oz).  Gen: Well-appearing male in no acute distress  MSK: Left hand CMC arthritis.  Difficult to small finger opposition.  Skin is warm, dry and intact.  No excessive warmth, erythema or ecchymosis.  Capillary refill is brisk.  Physical Exam        Imaging/other tests: Left hand x-rays taken today reviewed with  Mild triscaphe and moderate first CMC osteoarthrosis.      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     Results      @SPORTSPROMIS @      ADMINISTRATIVE     I have personally reviewed and interpreted the images (as available).  Point-of-care ultrasound imaging is on file and stored in a permanent location (if performed).  I have personally reviewed prior records and incorporated relevant information above (as available).    Note written with [employer].      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

## 2024-03-25 NOTE — Patient Instructions (Addendum)
 Plan: Activity as you tolerate with limitation for pain.  Wear the brace with activity as needed.      Thank you for coming to [facility] and our clinic today!     We aim to provide you with the highest quality, individualized care.  If you have any unanswered questions after the visit, please do not hesitate to reach out to us  on MyChart or leave a message for the nurse.  ?  MyChart messages: These messages can be sent to your provider and will be checked by their clinical support staff.? The messages are checked throughout the day during normal business hours from 8:30 am-4:00 pm Monday-Friday, however responses may take up to 48 hours.? Please use this method of communication for non-urgent and non-emergent concerns, questions, refill requests or inquiries only.? ?Our team will help respond to all of your questions.? Please note that you may be asked to see a provider by either a telehealth or in person visit if it is deemed your questions are best handled in the clinic setting in person.??  ?  Please keep in mind, these messages are not real time communications, so be patient when waiting for a response.    If you do not have access to MyChart, do not know how to use MyChart or have an issue that may require more extensive discussion, please call the nurses' call line: [phone].? This line is checked throughout the day and will be responded to as time allows.? Please note that return calls could take up to 48 hours, depending on the nature of the need.?  ?  If you have an issue that requires emergent attention that cannot wait; either call the Orthopaedics resident on call at [phone], consider coming to our [facility] walk-in clinic, or go to the nearest Emergency Department.    If you need to schedule future appointments, please call [phone].     We look forward to seeing you again in the future and appreciate you choosing [facility] for your care!    Thank you,                We provide innovative and comprehensive patient centered care that is supported by evidence-based research                                                                                                    RESEARCH PARTICIPATION    Please check out our current research studies to see if you or someone you know may qualify at:    [email or web address]

## 2024-03-25 NOTE — Telephone Encounter (Signed)
 Form Processing Form Left By: Self (Patient)  Type of Form: School Form  Completed Form Delivery: Faxed- Number   Last seen in-person: 03/24/2024  Last telemedicine visit: 03/24/2024  Date needed:     Scheduled Appt Date (if needed within 7 days): ASAP

## 2024-03-27 DIAGNOSIS — M549 Dorsalgia, unspecified: Principal | ICD-10-CM

## 2024-03-27 DIAGNOSIS — G8929 Other chronic pain: Principal | ICD-10-CM

## 2024-03-27 MED ORDER — GABAPENTIN 300 MG CAPSULE
ORAL_CAPSULE | ORAL | 3 refills | 0.00000 days | Status: CP
Start: 2024-03-27 — End: ?

## 2024-03-27 NOTE — Telephone Encounter (Signed)
 Copied from CRM #[id number]. Topic: Access To Clinicians - Medication Refill  >> Mar 27, 2024 10:16 AM German G wrote:  [facility] is requesting the following:    Medication Name(s), Dose/Strengths, and Instructions:   gabapentin  (NEURONTIN ) 300 MG capsule        Quantity for 1 month supply    Pharmacy name and address:   [facility] [phone]  [address] [place], [place], [place] [address]  [phone]      Coverage: yes, coverage is accurate on file.    Urgent turnaround time: within 24 business hours. (Caller Notified)    Urgent Reason: Completely out of medication(s)      Does the caller want to be contacted regarding this request? Yes. Please contact [facility] by [phone]    [facility] states the patient is out of refills and they can't submit a new refill, asking if provider can submit a new refill for the patient to a local pharmacy. Patient is completely out of medication

## 2024-03-28 NOTE — Telephone Encounter (Signed)
 Medication was sent yesterday to [facility].

## 2024-03-28 NOTE — Telephone Encounter (Signed)
 Contacted [facility] pharmacy in [place] and had them fill thirty day supply of Gabapentin  300 mg due to pt being completely out and can not wait for them to be mailed in. Spoke with pt and informed him that the pharmacy in [place] is working on filling his order and he can pick it up today. Let him know that he can call in his refills and have those delivered by mail. Pt states he understand.

## 2024-04-02 NOTE — Telephone Encounter (Signed)
-----   Message from Liza Beaver, MD sent at 04/02/2024  4:18 PM EST -----  Regarding: FW: repatha     Please let him know we are working on getting him Repatha  through a grant    AA  ----- Message -----  From: Ricky Bouchard  Sent: 03/28/2024   8:15 AM EST  To: Liza Beaver, MD  Subject: RE: repatha                                       Hey Dr. Liza,    I was able to get patient conditionally approved for [facility].  They are requiring that you complete a DX Verification Form, I have faxed that form to the clinic [phone], once that has been completed please fax back to me at [phone].    Thanks, Bouchard  ----- Message -----  From: Beaver Liza, MD  Sent: 03/27/2024   4:12 PM EST  To: Bouchard Ricky  Subject: repatha                                           Hi Megan - would you be able to look into the cost of Repatha  for this patient. He was previously getting it through [facility] but says the cost has now gone way up. Was he on a grant before and can he get back on it for next year?    Liza

## 2024-04-02 NOTE — Telephone Encounter (Signed)
 Spoke to patient to inform him we are working on getting him a grant to get his Repatha . V/u

## 2024-04-07 NOTE — Progress Notes (Signed)
 ORTHOPAEDIC NEW CLINIC NOTE     ASSESSMENT:  Christopher [name] is a 73 y.o. male with:  Left thumb CMC arthritis   Left palmar Dupuytren's nodules   Left index finger DIP arthritis  Left carpal tunnel release 02/17/2019  Left ulnar nerve transposition 02/17/2019    PLAN:  We had a thorough discussion today in clinic with the patient regarding his symptoms and clinical course.  He continues to do well postoperatively after his carpal tunnel and cubital tunnel surgeries in 2020.  He has had increasingly symptomatic left thumb CMC arthritis.  We discussed both operative and nonoperative treatment options.  His splint has been less effective for his CMC joint and he is not interested in any further injections.  We discussed the risk and benefits of surgery, as well as the expected postoperative course.  He would like to proceed with left thumb CMC arthroplasty, which we feel is reasonable.  We recommended continued observation and nonoperative management for his index finger DIP joint.  We will work to get him on the surgical schedule.  All questions were answered and the patient is in agreement with this plan    DME ORDER:  Dx:  ,            SUBJECTIVE:  Chief Complaint:  Left thumb CMC arthritis .    History of Present Illness:   Christopher [name] is a 73 y.o. male who presents for evaluation of his left thumb.  He reports multiple years of increasing left thumb pain.  He localizes pain to the base of his thumb.  He also has some pain in his index finger and on his palm.  He has some known Dupuytren's nodules that become increasingly tender to palpation.  He wears a thumb CMC splint regularly but he says this has become less and less effective with time.  He has had multiple steroid injections for other joint complaints in the past which have been minimally helpful.  He has not tried a steroid injection for his thumb CMC joint.  He denies a history of blood clot, stroke, PE.  He is a non-smoker. He has a history of a heart attack and has previous cardiac stents placed.  He underwent a bypass procedure in November 2023.  He takes aspirin  but is not on any formal anticoagulation.  He is right-hand dominant.      Medical History   Past Medical History[1]     Surgical History   Past Surgical History[2]   Medications   Current Medications[3]   Allergies   Morphine, Atorvastatin, Atorvastatin calcium , Baclofen, Ibuprofen, Pneumococcal 23-valent polysaccharide vaccine, and Zetia [ezetimibe]     Social History Social History[4]       Family History The patient's family history includes Alcohol abuse in his father; COPD in his brother, brother, and sister; Cancer in his father and maternal grandmother; Cataracts in his father, mother, and paternal grandfather; Diabetes in his brother, father, mother, sister, and sister; Early death in his brother; Hypertension in his father, maternal grandfather, maternal grandmother, mother, paternal grandfather, and paternal grandmother; Kidney disease in his maternal aunt; Melanoma in his father; No Known Problems in his maternal uncle, paternal aunt, paternal uncle, and another family member; Stroke in his mother..         Review of Systems No fever or chills.        Physical Exam:  - Examination of the left upper extremity shows he is neuovascularly intact with normal  sensation and strength throughout.  His skin is intact.  He has pain at the base of the thumb.  He is tender over the volar and dorsal aspects of the thumb CMC joint.  Pain with CMC grind test.  Nontender to palpation throughout the rest of the thumb.  Does have some tenderness over the index finger DIP joint.  Able to make a composite fist with a DPC of 0.5 cm limited by pain and stiffness.  Fingers are warm and well-perfused.  Negative Durkan's and Tinel's at the carpal tunnel.  Dupuytren's nodules along the palmar aspect of the ring finger that are tender to palpation.    Test Results  Imaging  Images were personally reviewed and interpreted by Dr. Jakie.   Radiographs of the left thumb CMC joint were ordered and independently interpreted today in the office.  Imaging demonstrates Eaton stage IV thumb CMC arthritis with cystic changes to the distal pole of the scaphoid.    Radiographs of the left hand from 03/25/2024 were independently reviewed and interpreted today in the office.  Imaging redemonstrates the known CMC arthritis.  There is also and erosive arthritis to the index finger DIP joint with complete loss of joint space and large osteophyte formation.      Problem List  Active Problems:    * No active hospital problems. 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    Corneal abrasion 1975    steel in right eye with abrasion and removal    Cubital tunnel syndrome on left 01/16/2019    Depression 2016    Diabetes mellitus (CMS-HCC)     Dysphagia     GERD (gastroesophageal reflux disease) 1982    Hypertension     Meningitis (HHS-HCC) 12/25/2015    Mixed hyperlipidemia     Myocardial infarction (CMS-HCC) 1986    Obesity 1986    first back surgery    Peptic ulceration    [2]   Past Surgical History:  Procedure Laterality Date    BACK SURGERY      CARDIAC CATHETERIZATION      STENTS PLACEMENT     CARDIAC SURGERY  2005    stents    CORONARY STENT PLACEMENT      ESOPHAGEAL DILATION      x 2    FOREIGN BODY REMOVAL Right about 20 years ago    HERNIA REPAIR      RIGHT INGUNIAL    JOINT REPLACEMENT Left     TOTAL SHOULDER    PR ARM/ELBOW TENDON LENGTHEN,SINGLE,EA Left 02/17/2019    Procedure: TENDON LENGTHENING UPPER ARM/ELBOW SNGL EA;  Surgeon: Delon Duwaine Terra Jakie, MD;  Location: ASC OR [facility];  Service: Orthopedics    PR CABG, ARTERIAL, SINGLE Midline 03/20/2022 Procedure: CORONARY ARTERY BYPASS, USING ARTERIAL GRAFT(S); SINGLE ARTERIAL GRAFT;  Surgeon: Ranelle Debby Bare, MD;  [facility]: MAIN [facility];  Service: Cardiac Surgery    PR CABG, ARTERY-VEIN, SINGLE Midline 03/20/2022    Procedure: CORONARY ARTERY BYPASS, USING VENOUS GRAFT(S) AND ARTERIAL GRAFT(S); SINGLE VEIN GRAFT;  Surgeon: Ranelle Debby Bare,  MD;  Location: MAIN OR [facility];  Service: Cardiac Surgery    PR CATH PLACE/CORON ANGIO, IMG SUPER/INTERP,W LEFT HEART VENTRICULOGRAPHY N/A 02/01/2022    Procedure: Left Heart Catheterization;  Surgeon: Pia Sharper, MD;  Location: [facility] CATH;  Service: Cardiology    PR ENDOSCOPY W/VIDEO-ASST VEIN HARVEST,CABG Right 03/20/2022    Procedure: ENDOSCOPY, SURGICAL, INCLUDING VIDEO-ASSISTED HARVEST OF VEIN(S) FOR CORONARY ARTERY BYPASS PROCEDURE;  Surgeon: Ranelle Debby Bare, MD;  Location: MAIN OR [facility];  Service: Cardiac Surgery    PR OSTEOTOMY 1ST METATARSAL,BASE/SHAFT Left 07/20/2017    Procedure: OSTEOTOMY, W/WO LENGTHENING, SHORTENING OR ANGULAR CORRECTION, METATARSAL; FIRST METATARSAL;  Surgeon: Fonda Rosalynn Cooler, MD;  Location: ASC OR [facility];  Service: Ortho Foot & Ankle    PR OSTEOTOMY HEEL BONE Left 07/20/2017    Procedure: OSTEOTOMYMERL MC W/WO INT FIXA;  Surgeon: Fonda Rosalynn Cooler, MD;  Location: ASC OR [facility];  Service: Ortho Foot & Ankle    PR OSTEOTOMY METATARSAL (NOT 1ST) Left 06/06/2018    Procedure: OSTEOTOMY, W/WO LENGTHENING, SHORTENING OR ANGULAR CORRECTION, METATARSAL; OTHER THAN FIRST METATARSAL, EA;  Surgeon: Fonda Rosalynn Cooler, MD;  Location: ASC OR [facility];  Service: Orthopedics    PR PART REMV PHALANX OF TOE Left 06/06/2018    Procedure: PART EXC BONE PHALANX TOE;  Surgeon: Fonda Rosalynn Cooler, MD;  Location: ASC OR [facility];  Service: Orthopedics    PR RECONSTR TOTAL SHOULDER IMPLANT Left 11/12/2018    Procedure: R28   ARTHROPLASTY, GLENOHUMERAL JOINT; TOTAL SHOULDER(GLENOID & PROXIMAL HUMERAL REPLACEMENT(EG, TOTAL SHOULDER);  Surgeon: Lamar Marsa Hamming, MD;  [facility]: [facility] OR [facility];  Service: Orthopedics    PR REMOVAL IMPLANT DEEP Left 11/12/2018    Procedure: REMOVE IMPLANT; DEEP SHOULDER;  Surgeon: Lamar Marsa Hamming, MD;  [facility]: [facility] OR [facility];  Service: Orthopedics    PR REPAIR BICEPS LONG TENDON Left 11/12/2018    Procedure: TENODESIS LONG TENDON BICEPS;  Surgeon: Lamar Marsa Hamming, MD;  [facility];  Service: Orthopedics    PR REPAIR PERONEAL TENDONS,FIB OSTEOTMY Left 07/20/2017    Procedure: REPR DISLOC PERONEAL TENDONS; W/FIB OSTEOTOMY;  Surgeon: Fonda Rosalynn Cooler, MD;  Location: ASC OR [facility];  Service: Ortho Foot & Ankle    PR REVISE MEDIAN N/CARPAL TUNNEL SURG Left 02/17/2019    Procedure: R22 NEUROPLASTY AND/OR TRANSPOSITION; MEDIAN NERVE AT CARPAL TUNNEL;  Surgeon: Delon Bouchard Terra Gander, MD;  Location: ASC OR [facility];  Service: Orthopedics    PR REVISE ULNAR NERVE AT ELBOW Left 02/17/2019    Procedure: NEUROPLASTY AND/OR TRANSPOSITION; ULNAR NERVE AT ELBOW;  Surgeon: Delon Bouchard Terra Gander, MD;  Location: [facility];  Service: Orthopedics    PR UP GI ENDOSCOPY,BALL DIL,30MM N/A 12/20/2023    Procedure: UGI ENDO; W/BALLOON DILAT ESOPHAGUS (<30MM DIAM);  Surgeon: Audrey Lacinda Hun, MD;  [facility];  Service: Gastroenterology    PR UPPER GI ENDOSCOPY,BIOPSY N/A 12/14/2023    Procedure: UGI ENDOSCOPY; WITH BIOPSY, SINGLE OR MULTIPLE;  Surgeon: Celestina Comer Norris, MD;  [facility]: [facility];  Service: Gastroenterology    PR UPPER GI ENDOSCOPY,BIOPSY N/A 12/20/2023    Procedure: UGI ENDOSCOPY; WITH BIOPSY, SINGLE OR MULTIPLE;  Surgeon: Audrey Lacinda Hun, MD;  [facility]: [facility];  Service: Gastroenterology    SHOULDER SURGERY Bilateral     L TSA, R ROTATOR CUFF      SKIN BIOPSY      SPINE SURGERY  1986/1995/2000/2002    LUMBAR FUSION  stent     [3]   Current Outpatient Medications   Medication Sig Dispense Refill    amlodipine  (NORVASC ) 10 MG tablet Take 1 tablet (10 mg total) by mouth daily. 100 tablet 3    aspirin  (ECOTRIN) 81 MG tablet Take 1 tablet (81 mg total) by mouth daily.      blood glucose control, low (TRUE METRIX LEVEL 1) Soln USE FOR GLUCOMETER MACHINE AS DIRECTED 1 each 3    blood-glucose meter kit Use as instructed. 1 each 0    evolocumab  (REPATHA  SURECLICK) 140 mg/mL PnIj Inject the contents of one pen (140 mg) under the skin every fourteen (14) days. 6 mL 3    fluticasone  propionate (FLONASE ) 50 mcg/actuation nasal spray 2 sprays into each nostril daily. 48 mL 3    gabapentin  (NEURONTIN ) 300 MG capsule TAKE 2 CAPSULES TWICE DAILY 180 capsule 3    JARDIANCE  10 mg tablet Take 1 tablet (10 mg total) by mouth daily. 90 tablet 3    metFORMIN  (GLUCOPHAGE ) 1000 MG tablet TAKE 1 TABLET EVERY MORNING AND TAKE 1 TABLET EVERY EVENING (TAKE WITH MEALS) 180 tablet 3    metoPROLOL  tartrate (LOPRESSOR ) 25 MG tablet TAKE 1 TABLET TWICE DAILY 180 tablet 2    omeprazole  (PRILOSEC) 20 MG capsule TAKE 1 CAPSULE (20 MG TOTAL) BY MOUTH DAILY. 90 capsule 3    sertraline  (ZOLOFT ) 100 MG tablet Take 1 tablet (100 mg total) by mouth daily. 90 tablet 1    tamsulosin  (FLOMAX ) 0.4 mg capsule Take 1 capsule (0.4 mg total) by mouth daily. 90 capsule 3    traZODone  (DESYREL ) 150 MG tablet Take 1 tablet (150 mg total) by mouth nightly. 90 tablet 3    trospium  (SANCTURA ) 20 mg tablet Take 1 tablet (20 mg total) by mouth two (2) times a day. 180 tablet 3    TRUE METRIX GLUCOSE TEST STRIP Strp CHECK BLOOD SUGAR AS DIRECTED ONCE A DAY AND FOR SYMPTOMS OF HIGH OR LOW BLOOD SUGAR. 100 strip 3    TRUEPLUS LANCETS 33 gauge Misc TEST BLOOD SUGAR EVERY DAY 100 each 3     No current facility-administered medications for this visit.   [4]   Social History  Socioeconomic History    Marital status: Married     Spouse name: Debra   Occupational History    Occupation: Insurance claims handler work   Tobacco Use    Smoking status: Never     Passive exposure: Never    Smokeless tobacco: Never    Tobacco comments:     never will   Vaping Use    Vaping status: Never Used   Substance and Sexual Activity    Alcohol use: No     Alcohol/week: 0.0 standard drinks of alcohol    Drug use: Not Currently    Sexual activity: Yes     Partners: Female   Other Topics Concern    Do you use sunscreen? No    Tanning bed use? No    Are you easily burned? Yes    Excessive sun exposure? Yes    Blistering sunburns? Yes    Exercise Yes    Living Situation No     Social Drivers of Health     Food Insecurity: Food Insecurity Present (10/13/2023)    Hunger Vital Sign     Worried About Running Out of Food in the Last Year: Sometimes true     Ran Out of Food in the Last Year: Patient declined  Tobacco Use: Low Risk (03/04/2024)    Patient History     Smoking Tobacco Use: Never     Smokeless Tobacco Use: Never     Passive Exposure: Never   Transportation Needs: No Transportation Needs (10/13/2023)    PRAPARE - Transportation     Lack of Transportation (Medical): No     Lack of Transportation (Non-Medical): No   Housing: Low Risk (10/13/2023)    Housing     Within the past 12 months, have you ever stayed: outside, in a car, in a tent, in an overnight shelter, or temporarily in someone else's home (i.e. couch-surfing)?: No     Are you worried about losing your housing?: No   Utilities: Low Risk (10/13/2023)    Utilities     Within the past 12 months, have you been unable to get utilities (heat, electricity) when it was really needed?: No   Interpersonal Safety: Not At Risk (01/29/2024)    Interpersonal Safety     Unsafe Where You Currently Live: No     Physically Hurt by Anyone: No     Abused by Anyone: No   Financial Resource Strain: Medium Risk (10/13/2023)    Overall Financial Resource Strain (CARDIA)     Difficulty of Paying Living Expenses: Somewhat hard   Internet Connectivity: Internet connectivity concern unknown (10/13/2023)    Internet Connectivity     Do you have access to internet services: Yes

## 2024-04-08 ENCOUNTER — Ambulatory Visit
Admit: 2024-04-08 | Discharge: 2024-04-08 | Payer: Medicare (Managed Care) | Attending: Orthopaedic Surgery | Primary: Orthopaedic Surgery

## 2024-04-08 ENCOUNTER — Inpatient Hospital Stay: Admit: 2024-04-08 | Discharge: 2024-04-08 | Payer: Medicare (Managed Care)

## 2024-04-08 NOTE — Patient Instructions (Signed)
 PRE-OPERATIVE INFORMATION       Madelyne Schiff, MD  [facility] Orthopaedics  Medical Questions/Concerns: [phone]  Surgery Scheduler: Gracie Lav [phone]  Financial Care Counselor: Wilene Hang [phone]  Estimate Line: [phone]        Surgery Date    If a surgery date is not provided to you today, our surgery scheduler will reach out to you within 1 week to provide a surgery date. If you have not been contacted within 1 week of your surgical consultation, please contact our scheduler: Gracie Lav at [phone].   If you need to cancel your surgery for any reason please call the scheduling office asap to due so.        Surgical Time  Surgical times are given out from 2:00pm-5:00pm on the last business day before your scheduled surgery. A preoperative nurse will call you during this time to confirm when you should arrive for your surgery. If you have not been called by 5:00pm please contact [phone].      The Night Before Your Surgery     DO NOT EAT OR DRINK ANYTHING AFTER MIDNIGHT, unless instructed otherwise. If you are diabetic, check with your primary care physician for special instructions concerning your insulin dosage prior to surgery. Take your usual medications as prescribed, unless otherwise instructed.         The Morning of Your Surgery    You should wear loose fitting clothes that will not be restrictive to your surgical site. Please leave jewelry, credit cards, cash, and other valuables at home. Do not wear any piercings, nail polish, make-up, or metal hair clips the day before surgery. Contact lenses, glasses, and dentures must be removed before surgery. Please make sure to bring a case to store these items in during surgery.     You may take your usual medications as prescribed with a small sip of water, unless otherwise instructed. Do NOT drink a full glass of water.     A RESPONSIBLE ADULT (OVER 18) MUST ACCOMPANY YOU ON THE DAY OF SURGERY AND BE AVAILABLE THROUGHOUT YOUR PROCEDURE IN THE EVENT THERE ARE QUESTIONS OR COMPLICATION. THEY ALSO MUST BE AVAILABLE TO TAKE YOU HOME FOLLOWING YOUR PROCEDURE AS IT WILL NOT BE SAFE FOR YOU TO DRIVE OR TAKE PUBLIC TRANSPORTATION ALONE.            FOLLOW-UP APPOINTMENTS    Your return appointment will be scheduled approximately 7-10 day after surgery, our scheduling office will call you to make a post operative follow-up appointment after surgery.    If you need to reschedule this appointment or have questions as to the time of your appointment please call.      OCCUPATIONAL THERAPY    You may have to attend Occupational Therapy following your surgery. A referral will be placed if it is necessary. If Occupational Therapy is determined to be necessary by your provider someone will be contacting you following your surgery to schedule. Many therapy appointments will be done same day after your first post-operative appointment with your provider.        IF YOU HAVE QUESTIONS or CONCERNS  During normal business hours, call Dr. Jennet Mode clinical support staff at [phone] for medical issues, or Dr. Jennet Mode surgery scheduler, Christyne Crane, at [phone] for scheduling concerns.      During evenings/nights/weekends, call [facility] at [phone] and ask for the on-call orthopaedic surgery resident for urgent medical issues.

## 2024-04-10 NOTE — Progress Notes (Signed)
Attending Attestation:  I saw and evaluated the patient, participating in the key portions of the service.     I determined the assessment and plan of care for the patient.  I reviewed and agree with the documented findings and plan in the resident's note above.  --J. Megan M. Sumaiya Arruda, MD

## 2024-04-15 DIAGNOSIS — M549 Dorsalgia, unspecified: Principal | ICD-10-CM

## 2024-04-15 DIAGNOSIS — I152 Hypertension secondary to endocrine disorders: Principal | ICD-10-CM

## 2024-04-15 DIAGNOSIS — G8929 Other chronic pain: Principal | ICD-10-CM

## 2024-04-15 DIAGNOSIS — E1159 Type 2 diabetes mellitus with other circulatory complications: Principal | ICD-10-CM

## 2024-04-15 NOTE — Telephone Encounter (Signed)
 This clinical research associate called pt to book appt to see provider for medication refill for prednisone  1MG . Able to book an in person appt tomorrow 11/24 at 1020 with Dr. Elige.

## 2024-04-15 NOTE — Telephone Encounter (Signed)
 Copied from CRM #[id number]. Topic: Access To Clinicians - Medication Refill  >> Apr 15, 2024  8:48 AM Jolaine Finder wrote:      The patient is requesting the following:     Medication Name(s), Dose/Strengths, and Instructions: predniSONE  (DELTASONE ) 1 MG tablet   Take 1 tablet (1 mg total) by mouth daily.    Pt in pain and needs asap    Quantity for 1 month supply    Pharmacy name and address:     [facility] [address]), [place] - [address]  [address] [place]) [place] [address]  Phone: [phone]  Fax: [phone]         Coverage: yes, coverage is accurate on file.    Urgent turnaround time: within 24 business hours. (Caller Notified)    Urgent Reason: Completely out of medication(s)      Does the caller want to be contacted regarding this request? Yes. Please contact The patient by Cell Phone Telephone Information:  Mobile          [phone]
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
## 2024-04-16 ENCOUNTER — Ambulatory Visit: Admit: 2024-04-16 | Discharge: 2024-04-17 | Payer: Medicare (Managed Care)

## 2024-04-16 DIAGNOSIS — E1122 Type 2 diabetes mellitus with diabetic chronic kidney disease: Principal | ICD-10-CM

## 2024-04-16 DIAGNOSIS — M353 Polymyalgia rheumatica: Principal | ICD-10-CM

## 2024-04-16 DIAGNOSIS — N182 Chronic kidney disease, stage 2 (mild): Principal | ICD-10-CM

## 2024-04-16 LAB — HEMOGLOBIN A1C
ESTIMATED AVERAGE GLUCOSE: 146 mg/dL
HEMOGLOBIN A1C: 6.7 % — ABNORMAL HIGH (ref 4.8–5.6)

## 2024-04-16 LAB — SEDIMENTATION RATE: ERYTHROCYTE SEDIMENTATION RATE: 31 mm/h — ABNORMAL HIGH (ref 0–20)

## 2024-04-16 LAB — C-REACTIVE PROTEIN: C-REACTIVE PROTEIN: 73.6 mg/L — ABNORMAL HIGH (ref <=10.0)

## 2024-04-16 MED ORDER — PREDNISONE 10 MG TABLET
ORAL_TABLET | ORAL | 3 refills | 0.00000 days | Status: CP
Start: 2024-04-16 — End: ?

## 2024-04-16 NOTE — Progress Notes (Signed)
 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)       Other    Polymyalgia rheumatica (HHS-HCC) - Primary    Relevant Orders    C-reactive protein    Sedimentation Rate       Prednisone  15 mg a day for next four weeks  Return in four weeks with PCP  Consider using inflammatory markers to decrease prednisone   Given DM, will need to monitor closely  A1c today  Updated problem list annotations  Database review and update  Med review and update  Lab review and update  Counseling provided and patient education    40 minutes with patient- 1/2 in face to face counseling on chronic health conditions and  PMR    SUBJECTIVE:    Christopher [name] is a 73 y.o. male that presents to clinic today regarding the following issues:    Diagnosed joint prob couple of years ago  Says it is recurring now  Hard to move when joints lock up   ? PMR- was on prednisone - was stopped earlier this year  Says that last five days can't get out of bed because of stiffness    Seen on September 2024 for same Note said:  Diagnosed January 2024 with profound fatige and weakness. Completed extended prednisone  taper and felt improved. Seen August 2024 for bilateral hip pain for prior two weeks (bilateral hip stiffness, pain with getting up) consistent with previous PMR flare. Started on prednisone  taper again (see below). Counseled to take first thing in the morning to help with sleep.      Taper as below:  - 15mg  daily for 4 weeks   - 12.5mg  daily for 4 weeks   - 10 mg daily for 4 weeks   - 7.5mg  for 4 weeks   - 5 mg for 4 weeks   - 2.5mg  for 4 weeks - 1mg  for 4 weeks     Does have diabetes - last A1c 7.3      PMH + DM, HTN, Lipids, GERD, PMR, Depression, Insomnia     reports that he has never smoked. He has never been exposed to tobacco smoke. He has never used smokeless tobacco.    The following portions of the patient's history were reviewed and updated as appropriate: allergies, current medications, past family history, past medical history, past social history, past surgical history and problem list.    Review of systems for 10 organ systems conducted and no significant positives      OBJECTIVE:    Vital signs have been reviewed:   Vitals:    04/16/24 1008   BP: 115/73   Pulse: 76   Temp: 36.6 ??C (97.8 ??F)     Stiff walking  Some proximal weakness

## 2024-04-20 MED ORDER — GABAPENTIN 100 MG CAPSULE
ORAL_CAPSULE | Freq: Three times a day (TID) | ORAL | 0 refills | 3.00000 days
Start: 2024-04-20 — End: 2024-04-23

## 2024-04-20 MED ORDER — OMEPRAZOLE 20 MG CAPSULE,DELAYED RELEASE
ORAL_CAPSULE | Freq: Every day | ORAL | 3 refills | 0.00000 days
Start: 2024-04-20 — End: ?

## 2024-04-20 MED ORDER — OXYCODONE 5 MG TABLET
ORAL_TABLET | ORAL | 0 refills | 2.00000 days | PRN
Start: 2024-04-20 — End: 2024-04-25

## 2024-04-20 MED ORDER — ONDANSETRON HCL 4 MG TABLET
ORAL_TABLET | Freq: Three times a day (TID) | ORAL | 0 refills | 3.00000 days | PRN
Start: 2024-04-20 — End: 2024-07-19

## 2024-04-20 MED ORDER — ACETAMINOPHEN 500 MG TABLET
ORAL_TABLET | Freq: Three times a day (TID) | ORAL | 0 refills | 10.00000 days
Start: 2024-04-20 — End: 2024-04-30

## 2024-04-21 ENCOUNTER — Inpatient Hospital Stay: Admit: 2024-04-21 | Discharge: 2024-04-21 | Payer: Medicare (Managed Care)

## 2024-04-21 ENCOUNTER — Encounter
Admit: 2024-04-21 | Discharge: 2024-04-21 | Payer: Medicare (Managed Care) | Attending: Student in an Organized Health Care Education/Training Program | Primary: Student in an Organized Health Care Education/Training Program

## 2024-04-21 ENCOUNTER — Ambulatory Visit: Admit: 2024-04-21 | Discharge: 2024-04-21 | Payer: Medicare (Managed Care)

## 2024-04-21 MED ORDER — ACETAMINOPHEN 500 MG TABLET
ORAL_TABLET | Freq: Three times a day (TID) | ORAL | 0 refills | 10.00000 days | Status: CP
Start: 2024-04-21 — End: 2024-05-01

## 2024-04-21 MED ORDER — OMEPRAZOLE 20 MG CAPSULE,DELAYED RELEASE
ORAL_CAPSULE | Freq: Every day | ORAL | 3 refills | 90.00000 days
Start: 2024-04-21 — End: ?

## 2024-04-21 MED ORDER — HYDROCODONE 5 MG-ACETAMINOPHEN 325 MG TABLET
ORAL_TABLET | ORAL | 0 refills | 2.00000 days | Status: CP | PRN
Start: 2024-04-21 — End: 2024-04-26

## 2024-04-21 MED ORDER — ONDANSETRON HCL 4 MG TABLET
ORAL_TABLET | Freq: Three times a day (TID) | ORAL | 0 refills | 3.00000 days | Status: CP | PRN
Start: 2024-04-21 — End: 2024-07-20

## 2024-04-21 MED ADMIN — ePHEDrine (PF) 25 mg/5 mL (5 mg/mL) in 0.9% sodium chloride syringe: INTRAVENOUS | @ 17:00:00 | Stop: 2024-04-21

## 2024-04-21 MED ADMIN — ePHEDrine (PF) 25 mg/5 mL (5 mg/mL) in 0.9% sodium chloride syringe: INTRAVENOUS | @ 16:00:00 | Stop: 2024-04-21

## 2024-04-21 MED ADMIN — Propofol (DIPRIVAN) injection: INTRAVENOUS | @ 16:00:00 | Stop: 2024-04-21

## 2024-04-21 MED ADMIN — lactated Ringers infusion: 10 mL/h | INTRAVENOUS | @ 16:00:00 | Stop: 2024-04-21

## 2024-04-21 MED ADMIN — ropivacaine (NAROPIN) 5 mg/mL (0.5 %) injection: PERINEURAL | @ 16:00:00 | Stop: 2024-04-21

## 2024-04-21 MED ADMIN — phenylephrine 1 mg/10 mL (100 mcg/mL) injection Syrg: INTRAVENOUS | @ 17:00:00 | Stop: 2024-04-21

## 2024-04-21 MED ADMIN — phenylephrine 1 mg/10 mL (100 mcg/mL) injection Syrg: INTRAVENOUS | @ 16:00:00 | Stop: 2024-04-21

## 2024-04-21 MED ADMIN — ondansetron (ZOFRAN) injection: INTRAVENOUS | @ 17:00:00 | Stop: 2024-04-21

## 2024-04-21 MED ADMIN — ceFAZolin (ANCEF) IVPB 2 g in 50 ml dextrose (premix): 30 mg/kg | INTRAVENOUS | @ 16:00:00 | Stop: 2024-04-21

## 2024-04-21 MED ADMIN — sodium chloride irrigation (NS) 0.9 % irrigation solution: @ 16:00:00 | Stop: 2024-04-21

## 2024-04-21 MED ADMIN — propofol (DIPRIVAN) infusion 10 mg/mL: INTRAVENOUS | @ 16:00:00 | Stop: 2024-04-21

## 2024-04-21 MED ADMIN — lidocaine (PF) (XYLOCAINE-MPF) 20 mg/mL (2 %) injection: INTRAVENOUS | @ 16:00:00 | Stop: 2024-04-21

## 2024-04-21 MED ADMIN — fentaNYL (PF) (SUBLIMAZE) injection: INTRAVENOUS | @ 16:00:00 | Stop: 2024-04-21

## 2024-04-21 NOTE — Discharge Instr - Anesthesia Complications (Signed)
 Ambulatory Surgery Nerve Block Instructions and FAQs    As part of your care today, you have received a regional anesthetic (nerve block) to assist with the management of moderate to severe pain after your procedure. As has been explained to you or your responsible person, this procedure was performed to ensure the highest possible degree of comfort and satisfaction, and toreduce the need for opioid pain medications after your surgery at [facility]. Below you will find a list of instructions to help guide you through the next few days with your nerve block, and telephone numbers that you may call at any time for further assistance.    What you need to know about your nerve block:    If you have pain, you may take your prescription pain medicine as directed. It will not interact with the medicine administered through your nerve block. The pain pills and the nerve block will work effectively together to reduce your pain.  Take your prescribed pain medicine as soon as you start feeling any discomfort in the extremity.  Avoid placing cold, hot, hard or sharp items on your numb body part.  Also be careful to not rest your extremity on hard or hot/cold surfaces.  If you received a cooling device after surgery it is okay to use it.  Be careful not to bang or bump the numb body part. Try to keep it in a neutral, comfortable position.  Elevate the limb; this will help decrease swelling and help increase comfort when the block wears off.  You may regain some movement or feeling, or experience some breakthrough pain, when the initial strong numbing medicine wears off. This is normal and expected.  If you experience any of the following symptoms, please call the number(s) listed below for your anesthesiologist:  Ringing in the ears  Lightheadedness  Numbness or tingling around the lips    If you had an upper extremity nerve block:  Be aware of the position of your arm and protect it from compression or other injury.  Ensure that your entire arm is supported, including the wrist.  Do not let the wrist dangle over the sling.  There are some possible side effects that you may experience with your particular block. These side effects will resolve as your block wears off. They include:  hoarse voice  slight redness of the eye  smaller pupil  sagging eyelid  mild shortness of breath (especially if lying down)    If you had a lower extremity nerve block:  Be aware of the position of your leg and protect it from compression or other injury.  Pad the knee, ankle and heel, and keep the foot and ankle elevated if possible.  Do NOT bear any weight on the numb extremity until you completely recover the feeling and full muscle strength.  Always seek assistance when getting up and moving about.    Numbers to call for help:    If you have any questiosn about your nerve block or your pain control, if you have any of the symptoms listed above, or if you are so directed by another health care provider, please call or text the physician(s) who placed your nerve block, 24 hours a day at the following numbers:    24/7 Anesthesiologist On-Call Phone: [phone]

## 2024-04-21 NOTE — Op Note (Signed)
 Preoperative Diagnosis: Left thumb CMC arthritis.     Post-operative Diagnosis:   Same.  Procedure:   1. Left thumb CMC arthroplasty - trapeziectomy with ligament reconstruction and tendon interposition using left FCR tendon.  [id number].      Resident Physician(s): zach visco, MD     Anesthesia: Peripheral nerve block with general     Antibiotics: Ancef IV preoperatively.      Tourniquet time: Please see the anesthesia record.      Estimated Blood Loss: *minimal *      Complications: * None*      Specimens: * None *      Indications for Surgery:   Christopher [name] is a 73 y.o. 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 He presents to the operating room today for surgical treatment. The risks, benefits, alternatives, and complications of the procedure were explained to the patient and their family and all their questions were answered.       Operative Findings:   End stage arthritic changes at the thumb [facility] joint were noted. The scapho-trapezoid joint was intact without arthritic changes.      Procedure:   The patient was identified in the preoperative holding area where the surgical site was marked with indelible ink. The patient was taken to the OR where a procedural timeout was called and the above noted anesthesia was induced. Preoperative antibiotics were dosed. The patient's left upper extremity was prepped and draped in the usual sterile fashion. Prior to the start of the surgical procedure a second preoperative timeout was called which included verifying the correct patient, the correct operative site, and correct procedure. We then commenced with our operation.      A knife was used to make a 2.5 cm longitudinal incision over the dorsal aspect of the thumb CMC joint. Blunt dissection was used to elevate full-thickness skin flaps. Branches of the dorsal radial sensory nerve were identified, mobilized, and carefully protected throughout the duration the procedure. Blunt dissection was used proximally to identify the radial artery as it coursed across the dorsal aspect of the proximal trapezium. It was mobilized and retracted proximally and protected throughout the duration of the procedure. Cautery was then used to make a longitudinal incision through the dorsal capsule and periosteum of the base of the thumb metacarpal, the CMC joint, and the dorsal trapezium. Cautery was used to make full-thickness capsular and periosteal flaps exposing the [facility] joint and dorsal trapezium. There was full-thickness cartilage loss noted at the [facility] joint. An osteotome was then used to divide the trapezium in half taking care to protect the FCR tendon crossing deep to the trapezium. The trapezium was then excised in its entirety. The small fluoroscopy unit was brought into the operative field. AP, lateral, and oblique images were obtained which confirmed complete excision of the trapezium. The visual inspection was then performed of the scapho-trapezoid joint which was found to be intact without arthritic changes.      We then proceeded to harvest the FCR tendon proximally in the forearm. 2 1.5 cm transverse incisions were made over the course of the FCR tendon in the volar forearm. One was made 6 cm proximal to the volar wrist crease and one was made 12 cm proximal. Blunt dissection was carried down through the subcutaneous tissues. The FCR tendon was easily identified. It's sheath was divided both proximally and distally. Traction was placed upon the FCR tendon to confirm its correct  identity. We then proceeded to divide the FCR tendon in the proximal incision. It was then delivered to the distal incision and finally delivered to the incision at the base of the thumb.      A rongeur was used to debride the subchondral bone off the base of thumb metacarpal. A mini Mytec suture anchor was then placed into the base of the thumb metacarpal. While using traction on the thumb the FCR tendon was then secured to the suture anchor to hold the thumb in a suspended position. Traction was released and the thumb was noted to be nicely suspended. The suture from the suture anchor was then used to take a small bite of the volar capsule. It was then woven through the remainder of the FCR tendon. The tendon was then folded upon itself and secured into the arthroplasty space. AP, lateral, and oblique fluoroscopic images were obtained which confirmed excellent suspension of the thumb. The wounds were thoroughly irrigated. The capsule and periosteum was repaired with a 3-0 Vicryl suture. The skin was closed with 4-0 nylon suture.        A sterile dressing was placed followed by a thumb spica splint.      The patient was awoken from anesthesia and taken to the PACU in stable condition without complication.      Post-op Plan/Instructions:   The patient will be discharged home. He will be non weight-bearing on the operative extremity. No DVT prophylaxis is indicated as the risk of DVT is acceptably low. Follow up plan will be in 10-14 days for suture removal and transition into thumb spica cast.        I was present for the entire procedure.  Delon CHRISTELLA Gander, MD

## 2024-04-22 MED FILL — REPATHA SURECLICK 140 MG/ML SUBCUTANEOUS PEN INJECTOR: SUBCUTANEOUS | 84 days supply | Qty: 6 | Fill #3

## 2024-04-22 NOTE — Addendum Note (Signed)
 Addendum  created 04/22/24 0930 by Tobie Passy, MD    Clinical Note Signed, Intraprocedure Event edited

## 2024-05-02 ENCOUNTER — Ambulatory Visit
Admit: 2024-05-02 | Discharge: 2024-05-03 | Payer: Medicare (Managed Care) | Attending: Orthopaedic Surgery | Primary: Orthopaedic Surgery

## 2024-05-02 NOTE — Progress Notes (Signed)
 Fiberglass/Plaster Cast Application  Performed by: Damien Salt  Authorizing provider: Jakie  Consent: Verbal consent obtained  Consent given by: Patient  Patient/Guardian understanding: Patient understands the procedure being performed.   Patient identity confirmed: Verbally with Patient  Laterality confirmed: Verbally with Patient    Supplies used: stockinette, cotton padding, fiberglass and/or plaster    Comments: The CMC (short thumb spica) cast with IP free was applied using standard casting techniques at the direction of the provider.  Patient was instructed in the proper cast precautions.  They were told to elevate the affected extremity when possible at or above the level of the heart and to keep the cast clean and dry.  If there are any problems with the cast, patient instructed to call the office, send MyChart message, or go to the emergency room ([address] [place], [place] [address]) after regular office hours. All questions were answered and the Patient expressed understanding. They will contact Orthopaedics [phone]) with any questions or concerns.

## 2024-05-02 NOTE — Progress Notes (Signed)
 INTERIM HISTORY:  Christopher [name] returns today for his first postoperative visit after undergoing a left [facility] arthroplasty on 04/21/2024.  He has been doing very well since his surgery.  He has had minimal discomfort.  He has kept his dressing clean and dry.  Pain has been well controlled with oral pain medications.     PHYSICAL EXAMINATION:  EXTREMITIES:   Examination of the left upper extremity shows the incision is healing appropriately.  There is a central area of wound dehiscence but no erythema or drainage.  There is a healthy appearing granulation bed.  He has normal sensation distally in the distribution of the dorsal radial sensory branch distal to the surgical site.  Sensation elsewhere is grossly normal.  His fingers are warm and well-perfused.  He is able to move his fingers with a DPC of 0.  He has appropriate postoperative swelling.     DIAGNOSTIC DATA:  None today.    ASSESSMENT:   Left thumb carpometacarpal arthroplasty, 04/21/2024.    PLAN: We had a thorough discussion today in clinic with the patient regarding his symptoms and clinical course.  He is doing well postoperatively.  We removed his sutures today and placed Steri-Strips.  We will closely monitor the area of wound dehiscence.  He was placed into a well molded, well-padded thumb spica cast.  Will plan to see him back in 2 weeks for cast removal and repeat clinical exam with no radiographs.  We will likely transition him into a removable wrist brace to begin working on range of motion with therapy at the next visit.

## 2024-05-02 NOTE — Patient Instructions (Signed)
 Caring for Your Cast: After Your Visit  Your Care Instructions  A cast protects a broken bone or other injury. Most casts are made of fiberglass, but plaster casts are still sometimes used.  Once a cast is on, you can't remove it yourself. Your doctor will take it off.  Follow-up care is a key part of your treatment and safety. Be sure to make and go to all appointments, and call your doctor if you are having problems. It's also a good idea to know your test results and keep a list of the medicines you take.  How can you care for yourself at home?  General care  Follow your doctor's instructions for when you can first put weight on the cast. Fiberglass casts dry quickly and are soon ready to bear weight. But plaster casts may take several days before they are hard enough to use. When it's okay to put weight on your cast, do not stand or walk on it unless it is designed for walking.  Prop up the injured arm or leg on a pillow anytime you sit or lie down during the next 3 days. Try to keep it above the level of your heart. This will help reduce swelling.  If the fingers or toes on the limb with the cast were not injured, wiggle them every now and then. This helps move the blood and fluids in the injured limb.  Be safe with medicines. Read and follow all instructions on the label.  If the doctor gave you a prescription medicine for pain, take it as prescribed.  If you are not taking a prescription pain medicine, ask your doctor if you can take an over-the-counter medicine.  Keep up your muscle strength and tone as much as you can while protecting your injured limb or joint. Your doctor may want you to tense and relax the muscles protected by the cast. Check with your doctor or physical therapist for instructions.  Water and your cast  Do not get your cast wet unless you have a fiberglass cast with a quick-drying lining.  Keep your cast covered with at least two layers of plastic when you take a shower or bath or when you have any other contact with water. Moisture can collect under the cast and cause skin irritation and itching. It can make infection more likely if you have had surgery or have a wound under the cast.  If you have a fiberglass cast with a fast-drying lining, make sure to rinse it with fresh water after you swim. It will take about an hour for the lining to dry.  Cast and skin care  Try blowing cool air from a hair dryer or fan into the cast to help relieve itching. Never stick items under your cast to scratch the skin.  Don't use oils or lotions near your cast. If the skin gets red or irritated around the edge of the cast, you may pad the edges with a soft material or use tape to cover them.  Watch for pressure sores. These can develop over bony areas. Symptoms include a warm spot under the cast, pain, drainage, or an odor. Call your doctor if you think you have a pressure sore.  Watch for compartment syndrome. This happens when pressure builds up in a group of muscles, nerves, and blood vessels. It is an emergency. Symptoms include severe pain or tingling or numbness.  When should you call for help?  Call your doctor now or seek immediate  medical care if:  You have increased or severe pain.  You feel a warm or painful spot under the cast.  You have problems with your cast. For example:  The skin under the cast burns or stings.  The cast feels too tight.  There is a lot of swelling near the cast. (Some swelling is normal.)  You have a new fever.  There is drainage or a bad smell coming from the cast.  Your foot or hand is cool or pale or changes color.  You have trouble moving your fingers or toes.  You have symptoms of a blood clot in your arm or leg (called a deep vein thrombosis). These may include:  Pain in the arm, calf, back of the knee, thigh, or groin.  Redness and swelling in the arm, leg, or groin.  Watch closely for changes in your health, and be sure to contact your doctor if:  The cast is breaking apart.  You are not getting better as expected.   Where can you learn more?   Go to http://[facility]  Enter [phone] in the search box to learn more about Caring for Your Cast: After Your Visit.   ?? 2006-2014 [employer]. Care instructions adapted under license by [facility]. This care instruction is for use with your licensed healthcare professional. If you have questions about a medical condition or this instruction, always ask your healthcare professional. [employer] disclaims any warranty or liability for your use of this information.  Content Version: 10.0.270728; Last Revised: November 07, 2011

## 2024-05-14 DIAGNOSIS — M1812 Unilateral primary osteoarthritis of first carpometacarpal joint, left hand: Principal | ICD-10-CM

## 2024-05-15 DIAGNOSIS — N182 Chronic kidney disease, stage 2 (mild): Principal | ICD-10-CM

## 2024-05-15 DIAGNOSIS — E1122 Type 2 diabetes mellitus with diabetic chronic kidney disease: Principal | ICD-10-CM

## 2024-05-15 DIAGNOSIS — I152 Hypertension secondary to endocrine disorders: Principal | ICD-10-CM

## 2024-05-15 DIAGNOSIS — E1159 Type 2 diabetes mellitus with other circulatory complications: Principal | ICD-10-CM

## 2024-05-15 NOTE — Progress Notes (Signed)
 [facility] [place] POPULATION HE[facility]  Care Management Progress Note    Date: 05/15/2024  Outcome:  Phone outreach completed    Is this patient in [place] ? Yes    Purpose of contact:         CM called Pt. for monthly CCM Check in:    1) Thumb Procedure: Pt. reports that he is recovering well from the procedure on his L thumb. He does not have any immediate concerns, but notes that he is getting the cast taken off tomorrow. He has not noticed any pain, swelling or abnormality     2) Insurance Update: When he switched to [employer], he was told that it would allow him to remain in network with [facility], but found out after the new year that it is OON. He has applied to switch to [facility] which will be in network after Feb 1st. He plans to bring his new insurance card to his orthopedic visit tomorrow to get it added to his chart.     3) HTN Management: Pt. Reports that his BP is doing well. He checked it 2 days ago and it was 117/77 which is great! He has not missed nay of his BP medication and is taking it as prescribed. He also reports no symptoms of high or low BP and has no concerns at this time    4) DM Management: Pt. Has been taking all of his medications as prescribed and hasn't missed any doses. His blood sugar yesterday morning was 118. He has not had any symptoms of high or low blood sugar besides frequent urination which he notes is ongoing since he is always drinking water /other liquids throughout the day     5) Parvovirus/Leg Pain: Pt. received steroids for his leg pain which has helped reduce it significantly. He does note that he is having pain in his L shoulder, but is planing to reach out to the doctor who performed his shoulder surgery to follow up on it.     Social Determinants of Health screener:  Social Drivers of Health     Food Insecurity: Food Insecurity Present (10/13/2023)    Hunger Vital Sign     Worried About Running Out of Food in the Last Year: Sometimes true Ran Out of Food in the Last Year: Patient declined   Tobacco Use: Low Risk (04/16/2024)    Patient History     Smoking Tobacco Use: Never     Smokeless Tobacco Use: Never     Passive Exposure: Never   Transportation Needs: No Transportation Needs (10/13/2023)    PRAPARE - Transportation     Lack of Transportation (Medical): No     Lack of Transportation (Non-Medical): No   Alcohol Use: Not on file   Housing: Low Risk (10/13/2023)    Housing     Within the past 12 months, have you ever stayed: outside, in a car, in a tent, in an overnight shelter, or temporarily in someone else's home (i.e. couch-surfing)?: No     Are you worried about losing your housing?: No   Physical Activity: Not on file   Utilities: Low Risk (10/13/2023)    Utilities     Within the past 12 months, have you been unable to get utilities (heat, electricity) when it was really needed?: No   Stress: Not on file   Interpersonal Safety: Not At Risk (04/21/2024)    Interpersonal Safety     Unsafe Where You Currently Live:  No     Physically Hurt by Anyone: No     Abused by Anyone: No   Substance Use: Not on file (02/27/2023)   Intimate Partner Violence: Not At Risk (04/21/2024)    Humiliation, Afraid, Rape, and Kick questionnaire     Fear of Current or Ex-Partner: No     Emotionally Abused: No     Physically Abused: No     Sexually Abused: No   Social Connections: Not on file   Financial Resource Strain: Medium Risk (10/13/2023)    Overall Financial Resource Strain (CARDIA)     Difficulty of Paying Living Expenses: Somewhat hard   Health Literacy: Not on file   Internet Connectivity: Internet connectivity concern unknown (10/13/2023)    Internet Connectivity     Do you have access to internet services: Yes     How do you connect to the internet: Not on file     Is your internet connection strong enough for you to watch video on your device without major problems?: Not on file     Do you have enough data to get through the month?: Not on file     Does at least one of the devices have a camera that you can use for video chat?: Not on file     Health Maintenance:  Health Maintenance Due   Topic Date Due    Medicare Annual Wellness Visit (AWV)  06/05/2014    Zoster Vaccines (2 of 3) 06/16/2014    Colon Cancer Screening  07/09/2023    Retinal Eye Exam  11/14/2023    COVID-19 Vaccine (5 - 2025-26 season) 12/24/2023    Foot Exam  05/16/2024     Additional Information/Plan:  Patient provided my direct contact information and encouraged to contact me should additional needs arise.    Time Spent Per Day:  Chart review was completed prior to outreach attempt.   05/15/2024: 10    Leila Center, CMA  Population Health  [facility] [place]

## 2024-05-15 NOTE — Progress Notes (Unsigned)
 [facility]   OUTPATIENT OCCUPATIONAL THERAPY    UPPER EXTREMITY                                         Patient Name: Christopher [name]  Date of Birth:1950-07-25  Date: 05/16/2024  Diagnosis: No diagnosis found.  Referring Provider: Delon Bouchard Terra Gander      Visits/Authorization  Visit #: Visit count could not be calculated. Make sure you are using a visit which is associated with an episode.  Completed/Authorized Visits: 0/1  Plan of Care Certification Dates:         Referral Information  Reason for referral: left cmc arthroplasty   Onset of Symptoms: 04/21/24   Per Referring Provider's note:  We had a thorough discussion today in clinic with the patient regarding his symptoms and clinical course.  He is doing well postoperatively.  We removed his sutures today and placed Steri-Strips.  We will closely monitor the area of wound dehiscence.  He was placed into a well molded, well-padded thumb spica cast.  Will plan to see him back in 2 weeks for cast removal and repeat clinical exam with no radiographs.  We will likely transition him into a removable wrist brace to begin working on range of motion with therapy at the next visit.       OT EVALUATION ASSESSMENT:   74 y.o. year old with above diagnosis. *** Patient requires skilled Occupational Therapy services for decreased range of motion, decreased strength, orthotic fit/management, impaired daily activities of living as appropriate.     Prognosis: {REHAB Prognosis:[phone]} due to {Prognosis Rationale:[phone]}  Communication preference: verbal, written, visual    COMPLEXITY  Low complexity: This patient demonstrates 1-3 performance deficits relating to physical, cognitive, and psychosocial skills resulting in activity limitations and/or participation restrictions.  This patient has no co-morbidities affecting occupational performance.  A problem focused assessment was performed.  Please refer to Current level of function section for further details.     Moderate complexity: This patient demonstrates 3-5 performance deficits relating to physical, cognitive and psychosocial skills that result in activity limitations and/or participation restrictions.  This patient may have comorbidities affecting occupational performance.  Please refer to Current level of function section for further details.     High complexity: This patient demonstrates 5 or more performance deficits relating to physical, cognitive and psychosocial skills that result in activity limitations and/or participation restrictions.  This patient has comorbidities that affect occupational performance.  Please refer to Current level of function section for further details.     GOALS    In *** visits the patient will:     Perform home exercise program with need for cuing to max IND with ADLs and IADLs.   Demonstrate independent donning and doffing and care of orthotic to max joint integrity necessary for ADL completion.   Achieve full composite fist to max IND with grasp and release of daily living tools  Achieve functional composite fist to max IND with grasp and release of daily living tools  Demonstrate active wrist extension/flexion of *** / *** to improve ability to manipulate items for self care and IADL tasks.   Demonstrate active elbow extension/flexion of 0/130 to improve ability to reach for ADL/IADL tools, open doors, and manipulate key items in their environment.   Demonstrate active forearm pronation/supination to 75/75 to maximize ability to pour, type, carry  items, and prepare meals.   Demonstrate active extension/flexion of the *** joint to *** / ***in order to maximize grasp  Improve grip strength to 65% of their contralateral side to max IND with I/ADLs, such as carrying/lifting heavy functional items.  Improve pinch strength to 65% of their contralateral side to maximize ability to hold and manipulate functional ADL/IADL items such as keys, silverware, and grooming tools. Report =< 2/10 pain score with activities to max IND with I/ADLs, such as writing, lifting/carrying functional items.  Be independent with joint protection principles and use of adaptive equipment to be independent with I/ADLs.  Will demonstrate an 11 point  improvement on the QuickDash representing improved IND in ADL tasks.  (11 points = minimal detectable change)       OT  PLAN OF CARE:      Pt will participate in:  Self Care/Hometraining  Orthotic Fit/Management   Therapeutic Exercise  Therapeutic Activity   Neuromuscular Re-education  Ultrasound  Hot/Cold Pack  Electrical Stimulation  Iontophoresis  Orthotic/Prosthetic Measure and Fit   Joint Mobilization  Physical Performance Measure   Manual Therapy    Planned frequency and duration of treatment: {AMB REHAB THERAPEUTIC FREQUENCIES:[phone]}/ {NUMBERS 1-20:19198} {AMB TIME UNITS REHAB:[phone]}. Plan will be adjusted as necessary.     Patient in agreement with plan of care?: Yes      PRECAUTIONS  Cancer: {YES/NO:21013:::1}  Metal implants:  {YES/NO:21013:::1}  Pacemaker:  {YES/NO:21013:::1}  Diabetes:  {YES/NO:21013:::1}  Latex allergy: {YES/NO:21013:::1}  Pregnancy: {YES/NO:21013:::1}      HEALING TIMELINE / POST OP PROTOCOL      Week 4: 01/26 :4 Weeks Postop AROM exercises are initiated to the thumb CMCJ and MP joint 4-6 times a day for 10 minute sessions. Exercises should emphasize: Thumb flexion, extension and palmar abduction Thumb circumduction (clockwise/counter-clockwise) Lightly touching each finger with the thumb It is important to emphasize avoiding movement patterns such as lateral pinch, adduction of the thumb against the index finger and wide radial abduction. This will avoid overstretching of the LTRI and accentuating the deforming forces that generated instability    Week 5: 02/02: PROM exercises are added to the MPJ and IPJ of the thumb, with the [facility] joint supported (manually or with an orthosis).    Week 6: 02/09: Add isometric resistance for the APB/opponens. On rare occasion, it becomes necessary to add a custom-fabricated dynamic flexion orthosis for the MP and IP joint of the thumb. Any dynamic orthosis must be custom-fabricated to provide maximal support of the [facility] joint and provide the proper alignment and dynamic traction to the MPJ and IPJ. Typically, the orthosis is worn for 20-30 minute sessions 3-4 times a day. The patient is encouraged to begin weaning out of the short opponens orthosis during the day. This can be a gradual process over 2-4 weeks. To leave the orthosis off for light ADLs 3-4 times a day (<= 1 hour)is encouraged. Patients fitted with the wrist and thumb static orthosis at night are encouraged to do so for 8-10 weeks and gradually eliminate by 12 weeks postop. The hand-based custom-fabricated orthosis or a prefabricated neoprene prefabricated orthosis should be worn during the day with repetitive hand activity    Week 8: 2/23: Gentle strengthening may be initiated when the patient reports concerns for hand and thumb strength. Soft putty can help build endurance in the hand. [Note: The preferred method for recapturing hand and thumb strength is through normal daily activity versus a focus on using putty or a hand  exerciser.      Weeks 10-12: 03/09 and beyond: Begin normal use of hand    Protocol: [place]     SUBJECTIVE:    Patient reports: ***    Prior OT Service: {YES/NO:21137}    Social history:    Occupation: ***   Hobbies/leisure: ***    PLOF:   ADLS: {fxl status:52127}   IADLS: {fxl status:52127}    CLOF:    ADLS: {fxl status:52127}   IADLS: {fxl status:52127}      Pain: {Pain Score:52422}    Sensation: {[phone]}    OBJECTIVE:      Upper Extremity Function:    Shoulder:  {AROM/[phone]}  {Strength/MMT:51501}    Elbow:  {[phone]}  {Strength/MMT:51501}    Hand Dominance: {Left right alternating :55426}               AROM (degrees) Date: ***  Right Date: ***  Left   Wrist Extension/flexion     Radial/ulnar deviation     Forearm     Supination/pronation     Elbow     Ext/flex     Composite flex to DPC   (cm lack)     Index     Middle     Ring     Small     Digit Extension     Thumb  Opposition        Digit ROM Index finger Long finger Ring finger Small finger   MCP ext/flex       PIP ext/flex       DIP ext/flex       TAM total         Thumb ROM    MP ext/flex    IP ext/flex    Radial abduction     Palmar abduction     TAM total        Grip and Pinch Strength Date:***  Right Date:***  Left   Gross Grip Strength (lbs)  Position 2     Trial 1     Trial 2     Trial 3          Average     Pinch Strength (lbs)     Lateral Pinch     3-point Pinch     Tip Pinch         Other notes: *** Testing deferred due to protocol / pain     Norms: It is the position of the [facility] that while validated and tested outcome measures are important in clinical testing formal grip/pinch strength is NOT an accurate measure of patient functioning. This is due to several factors including but not limited to hormonal fluctuations caused by natural aging, gender affirming care, the presence of underlying or undetected medial conditions, and the historically poor representation of several populations throughout normative testing. Therefore functional deficits should be prioritized when considering deficits or impairments.     {Hand OT Norms:[phone]}    OBSERVATION    Wound/Incision(s): {Exam; wound:13612} Incision covered with steri strips  Scar: {Scar:([phone]}    Edema: {AMB OT Zizfj:[id number]}    Educated pt on the need to perform active range of motion to allow adequate venous return and prevent stiffness.     FUNCTIONAL OUTCOME MEASURES    {Tests:[phone]}    Quick Dash     Rate from 1 (no difficulty) to 5 (unable)     1: Open a tight/new jar   ***    2: Do heavy household chores  ***  3: Carry a shopping bag or brief case  ***    4: Wash your back  ***    5: Use a knife to cut food  ***    6: Recreational activities which take some force or impact through arm/shoulder/hand  ***    Rate from 1 (not at all) to 5 (extremely)      7: In the past week to what extend did your arm/shoulder/hand interfere with social activities  ***    Rate from 1 (not at all) to 5 (unable)      8: In the past week how limited were you in your work or regular daily activities because of your hand/shoulder/arm  ***    Rate from 1 (none) to 5 (extreme)     9:  Arm/shoulder/hand pain  ***    10: Tingling/pins and needles in arm/shoulder/hand  ***    Rate from 1 (no difficulty) to 5 (unable)      11: During the past week how much difficulty have you had sleeping because of your arm/shoulder/hand  ***                                                         Raw Total  ***                                     Percent (%) disability  ***    MDC for upper extremity musculoskeletal disorders ([place], 2010) = 11    TREATMENT:  Orthotic fit and training (*** mins)   Fabrication of a custom *** orthosis was completed as per MD order to address ***. Pt was educated in donning/doffing procedures, wear and care, skin care, and hygiene. Pt instructed to wear the orthosis ***. Pt provided with number for this clinic and instruction to reach out should they experience irritation of the skin, alteration of fit, or alternate splint concern. Pt verbalized understanding of all of the above.     Self Care/home training (*** minutes):  Therapist issued HEP with patient demonstration (see below) with handouts provided to the patient.   Educated pt on weightbearing precautions and healing time-line.    Home Program:   Apply low to moderate heat 10 min prior to exercises for improved tissue extensibility  Initiated home training    Patient Education:  Topics: home program, disease process  Education Provided to: {PATIENT/FAMILY/CAREGIVER/SO:21563}  Education Type: education, demonstration, literature  Response to education/teachback: verbal understanding received, return demonstration      TREATMENT RENDERED  Today's Charges (noted here with $$):                           Total Time:  *** minutes      I attest that I have reviewed the above information.  Signed: Donnice FORBES Mooring, OT  05/16/2024 3:08 PM    I reviewed the no-show/attendance policy with the patient and caregiver(s). The family is aware that they must call to cancel appointments more than 24 hours in advance. They are also aware that if they late cancel or no-show three times, we reserve the right to cancel their remaining appointments. This policy is in place to allow us   to best serve the needs of our caseload.    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    Corneal abrasion 1975    steel in right eye with abrasion and removal    Cubital tunnel syndrome on left 01/16/2019    Depression 2016    Diabetes mellitus (CMS-HCC)     Dysphagia     GERD (gastroesophageal reflux disease) 1982    Hypertension     Meningitis (HHS-HCC) 12/25/2015    Mixed hyperlipidemia     Myocardial infarction (CMS-HCC) 1986    Obesity 1986    first back surgery    Peptic ulceration    [2]   Past Surgical History:  Procedure Laterality Date    BACK SURGERY      CARDIAC CATHETERIZATION      STENTS PLACEMENT     CARDIAC SURGERY  2005    stents    CORONARY STENT PLACEMENT      ESOPHAGEAL DILATION      x 2    FOREIGN BODY REMOVAL Right about 20 years ago    HERNIA REPAIR RIGHT INGUNIAL    JOINT REPLACEMENT Left     TOTAL SHOULDER    PR ARM/ELBOW TENDON LENGTHEN,SINGLE,EA Left 02/17/2019    Procedure: TENDON LENGTHENING UPPER ARM/ELBOW SNGL EA;  Surgeon: Delon Duwaine Terra Jakie, MD;  Location: ASC OR [facility];  Service: Orthopedics    PR ARTHRP INTERCARPAL/CARP/MTCRPL JT INTERPOSITION Left 04/21/2024    Procedure: ARTHROPLASTY, INTERCARPAL OR CARPOMETACARPAL JOINTS; INTERPOSITION (EG, TENDON);  Surgeon: Jakie Delon Duwaine Terra, MD;  Location: OR [facility];  Service: Orthopedics    PR CABG, ARTERIAL, SINGLE Midline 03/20/2022    Procedure: CORONARY ARTERY BYPASS, USING ARTERIAL GRAFT(S); SINGLE ARTERIAL GRAFT;  Surgeon: Ranelle Debby Bare, MD;  Location: MAIN OR [facility];  Service: Cardiac Surgery    PR CABG, ARTERY-VEIN, SINGLE Midline 03/20/2022    Procedure: CORONARY ARTERY BYPASS, USING VENOUS GRAFT(S) AND ARTERIAL GRAFT(S); SINGLE VEIN GRAFT;  Surgeon: Ranelle Debby Bare, MD;  Location: MAIN OR [facility];  Service: Cardiac Surgery    PR CATH PLACE/CORON ANGIO, IMG SUPER/INTERP,W LEFT HEART VENTRICULOGRAPHY N/A 02/01/2022    Procedure: Left Heart Catheterization;  Surgeon: Pia Sharper, MD;  Location: [facility] CATH;  Service: Cardiology    PR ENDOSCOPY W/VIDEO-ASST VEIN HARVEST,CABG Right 03/20/2022    Procedure: ENDOSCOPY, SURGICAL, INCLUDING VIDEO-ASSISTED HARVEST OF VEIN(S) FOR CORONARY ARTERY BYPASS PROCEDURE;  Surgeon: Ranelle Debby Bare, MD;  Location: MAIN OR [facility];  Service: Cardiac Surgery    PR OSTEOTOMY 1ST METATARSAL,BASE/SHAFT Left 07/20/2017    Procedure: OSTEOTOMY, W/WO LENGTHENING, SHORTENING OR ANGULAR CORRECTION, METATARSAL; FIRST METATARSAL;  Surgeon: Fonda Rosalynn Cooler, MD;  Location: ASC OR [facility];  Service: Ortho Foot & Ankle    PR OSTEOTOMY HEEL BONE Left 07/20/2017    Procedure: OSTEOTOMY; CALCAN W/WO INT FIXA;  Surgeon:  Fonda Rosalynn Cooler, MD;  Location: ASC OR [facility];  Service: Ortho Foot & Ankle    PR OSTEOTOMY METATARSAL (NOT 1ST) Left 06/06/2018    Procedure: OSTEOTOMY, W/WO LENGTHENING, SHORTENING OR ANGULAR CORRECTION, METATARSAL; OTHER THAN FIRST METATARSAL, EA;  Surgeon: Fonda Rosalynn Cooler, MD;  Location: ASC OR [facility];  Service: Orthopedics    PR PART REMV PHALANX OF TOE Left 06/06/2018    Procedure: PART EXC BONE PHALANX TOE;  Surgeon: Fonda Rosalynn Cooler, MD;  Location: ASC OR [facility];  Service: Orthopedics    PR RECONSTR TOTAL SHOULDER IMPLANT Left 11/12/2018    Procedure: R28   ARTHROPLASTY, GLENOHUMERAL JOINT; TOTAL SHOULDER(GLENOID & PROXIMAL HUMERAL REPLACEMENT(EG, TOTAL SHOULDER);  Surgeon: Lamar Marsa Hamming, MD;  Location: [facility];  Service: Orthopedics    PR REMOVAL IMPLANT DEEP Left 11/12/2018    Procedure: REMOVE IMPLANT; DEEP SHOULDER;  Surgeon: Lamar Marsa Hamming, MD;  Location: [facility] OR [facility];  Service: Orthopedics    PR REPAIR BICEPS LONG TENDON Left 11/12/2018    Procedure: TENODESIS LONG TENDON BICEPS;  Surgeon: Lamar Marsa Hamming, MD;  Location: [facility] OR [facility];  Service: Orthopedics    PR REPAIR PERONEAL TENDONS,FIB OSTEOTMY Left 07/20/2017    Procedure: REPR DISLOC PERONEAL TENDONS; W/FIB OSTEOTOMY;  Surgeon: Fonda Rosalynn Cooler, MD;  Location: ASC OR [facility];  Service: Ortho Foot & Ankle    PR REVISE MEDIAN N/CARPAL TUNNEL SURG Left 02/17/2019    Procedure: R22 NEUROPLASTY AND/OR TRANSPOSITION; MEDIAN NERVE AT CARPAL TUNNEL;  Surgeon: Delon Bouchard Terra Gander, MD;  Location: ASC OR [facility];  Service: Orthopedics    PR REVISE ULNAR NERVE AT ELBOW Left 02/17/2019    Procedure: NEUROPLASTY AND/OR TRANSPOSITION; ULNAR NERVE AT ELBOW;  Surgeon: Delon Bouchard Terra Gander, MD;  Location: ASC OR [facility];  Service: Orthopedics    PR TRANSPLANT HAND TENDON Left 04/21/2024    Procedure: TRANSFER OR TRANSPLANT OF TENDON, CARPOMETACARPAL AREA OR DORSUM OF HAND; WITHOUT FREE GRAFT, EACH TENDON;  Surgeon: Gander Delon Bouchard Terra, MD;  Location: OR [facility]; Service: Orthopedics    PR UP GI ENDOSCOPY,BALL DIL,30MM N/A 12/20/2023    Procedure: UGI ENDO; W/BALLOON DILAT ESOPHAGUS (<30MM DIAM);  Surgeon: Audrey Lacinda Hun, MD;  [facility]: [facility];  Service: Gastroenterology    PR UPPER GI ENDOSCOPY,BIOPSY N/A 12/14/2023    Procedure: UGI ENDOSCOPY; WITH BIOPSY, SINGLE OR MULTIPLE;  Surgeon: Celestina Comer Norris, MD;  Location: [facility] PROCEDURES [facility];  Service: Gastroenterology    PR UPPER GI ENDOSCOPY,BIOPSY N/A 12/20/2023    Procedure: UGI ENDOSCOPY; WITH BIOPSY, SINGLE OR MULTIPLE;  Surgeon: Audrey Lacinda Hun, MD;  [facility]: [facility];  Service: Gastroenterology    SHOULDER SURGERY Bilateral     L TSA, R ROTATOR CUFF      SKIN BIOPSY      SPINE SURGERY  1986/1995/2000/2002    LUMBAR FUSION    stent     [3]   Current Outpatient Medications:     amlodipine  (NORVASC ) 10 MG tablet, Take 1 tablet (10 mg total) by mouth daily., Disp: 100 tablet, Rfl: 3    aspirin  (ECOTRIN) 81 MG tablet, Take 1 tablet (81 mg total) by mouth daily., Disp: , Rfl:     blood glucose control, low (TRUE METRIX LEVEL 1) Soln, USE FOR GLUCOMETER MACHINE AS DIRECTED, Disp: 1 each, Rfl: 3    evolocumab  (REPATHA  SURECLICK) 140 mg/mL PnIj, Inject the contents of one pen (140 mg) under the skin every fourteen (  14) days., Disp: 6 mL, Rfl: 3    fluticasone  propionate (FLONASE ) 50 mcg/actuation nasal spray, 2 sprays into each nostril daily., Disp: 48 mL, Rfl: 3    gabapentin  (NEURONTIN ) 300 MG capsule, TAKE 2 CAPSULES TWICE DAILY, Disp: 180 capsule, Rfl: 3    JARDIANCE  10 mg tablet, Take 1 tablet (10 mg total) by mouth daily., Disp: 90 tablet, Rfl: 3    metFORMIN  (GLUCOPHAGE ) 1000 MG tablet, TAKE 1 TABLET EVERY MORNING AND TAKE 1 TABLET EVERY EVENING (TAKE WITH MEALS), Disp: 180 tablet, Rfl: 3    metoPROLOL  tartrate (LOPRESSOR ) 25 MG tablet, TAKE 1 TABLET TWICE DAILY, Disp: 180 tablet, Rfl: 2    omeprazole  (PRILOSEC) 20 MG capsule, TAKE 1 CAPSULE (20 MG TOTAL) BY MOUTH DAILY., Disp: 90 capsule, Rfl: 3    ondansetron  (ZOFRAN ) 4 MG tablet, Take 1 tablet (4 mg total) by mouth every eight (8) hours as needed for nausea., Disp: 8 tablet, Rfl: 0    predniSONE  (DELTASONE ) 10 MG tablet, 1.5 tab po q day, Disp: 90 tablet, Rfl: 3    sertraline  (ZOLOFT ) 100 MG tablet, Take 1 tablet (100 mg total) by mouth daily., Disp: 90 tablet, Rfl: 1    tamsulosin  (FLOMAX ) 0.4 mg capsule, Take 1 capsule (0.4 mg total) by mouth daily., Disp: 90 capsule, Rfl: 3    traZODone  (DESYREL ) 150 MG tablet, Take 1 tablet (150 mg total) by mouth nightly., Disp: 90 tablet, Rfl: 3    trospium  (SANCTURA ) 20 mg tablet, Take 1 tablet (20 mg total) by mouth two (2) times a day., Disp: 180 tablet, Rfl: 3    TRUE METRIX GLUCOSE TEST STRIP Strp, CHECK BLOOD SUGAR AS DIRECTED ONCE A DAY AND FOR SYMPTOMS OF HIGH OR LOW BLOOD SUGAR., Disp: 100 strip, Rfl: 3    TRUEPLUS LANCETS 33 gauge Misc, TEST BLOOD SUGAR EVERY DAY, Disp: 100 each, Rfl: 3

## 2024-05-16 ENCOUNTER — Ambulatory Visit: Admit: 2024-05-16 | Discharge: 2024-05-17 | Payer: Medicare (Managed Care)

## 2024-05-16 ENCOUNTER — Ambulatory Visit: Admit: 2024-05-16 | Payer: Medicare (Managed Care)

## 2024-05-16 NOTE — Patient Instructions (Signed)
 Caring for Your Cast: After Your Visit  Your Care Instructions  A cast protects a broken bone or other injury. Most casts are made of fiberglass, but plaster casts are still sometimes used.  Once a cast is on, you can't remove it yourself. Your doctor will take it off.  Follow-up care is a key part of your treatment and safety. Be sure to make and go to all appointments, and call your doctor if you are having problems. It's also a good idea to know your test results and keep a list of the medicines you take.  How can you care for yourself at home?  General care  Follow your doctor's instructions for when you can first put weight on the cast. Fiberglass casts dry quickly and are soon ready to bear weight. But plaster casts may take several days before they are hard enough to use. When it's okay to put weight on your cast, do not stand or walk on it unless it is designed for walking.  Prop up the injured arm or leg on a pillow anytime you sit or lie down during the next 3 days. Try to keep it above the level of your heart. This will help reduce swelling.  If the fingers or toes on the limb with the cast were not injured, wiggle them every now and then. This helps move the blood and fluids in the injured limb.  Be safe with medicines. Read and follow all instructions on the label.  If the doctor gave you a prescription medicine for pain, take it as prescribed.  If you are not taking a prescription pain medicine, ask your doctor if you can take an over-the-counter medicine.  Keep up your muscle strength and tone as much as you can while protecting your injured limb or joint. Your doctor may want you to tense and relax the muscles protected by the cast. Check with your doctor or physical therapist for instructions.  Water and your cast  Do not get your cast wet unless you have a fiberglass cast with a quick-drying lining.  Keep your cast covered with at least two layers of plastic when you take a shower or bath or when you have any other contact with water. Moisture can collect under the cast and cause skin irritation and itching. It can make infection more likely if you have had surgery or have a wound under the cast.  If you have a fiberglass cast with a fast-drying lining, make sure to rinse it with fresh water after you swim. It will take about an hour for the lining to dry.  Cast and skin care  Try blowing cool air from a hair dryer or fan into the cast to help relieve itching. Never stick items under your cast to scratch the skin.  Don't use oils or lotions near your cast. If the skin gets red or irritated around the edge of the cast, you may pad the edges with a soft material or use tape to cover them.  Watch for pressure sores. These can develop over bony areas. Symptoms include a warm spot under the cast, pain, drainage, or an odor. Call your doctor if you think you have a pressure sore.  Watch for compartment syndrome. This happens when pressure builds up in a group of muscles, nerves, and blood vessels. It is an emergency. Symptoms include severe pain or tingling or numbness.  When should you call for help?  Call your doctor now or seek immediate  medical care if:  You have increased or severe pain.  You feel a warm or painful spot under the cast.  You have problems with your cast. For example:  The skin under the cast burns or stings.  The cast feels too tight.  There is a lot of swelling near the cast. (Some swelling is normal.)  You have a new fever.  There is drainage or a bad smell coming from the cast.  Your foot or hand is cool or pale or changes color.  You have trouble moving your fingers or toes.  You have symptoms of a blood clot in your arm or leg (called a deep vein thrombosis). These may include:  Pain in the arm, calf, back of the knee, thigh, or groin.  Redness and swelling in the arm, leg, or groin.  Watch closely for changes in your health, and be sure to contact your doctor if:  The cast is breaking apart.  You are not getting better as expected.   Where can you learn more?   Go to http://[facility]  Enter [phone] in the search box to learn more about Caring for Your Cast: After Your Visit.   ?? 2006-2014 [employer]. Care instructions adapted under license by [facility]. This care instruction is for use with your licensed healthcare professional. If you have questions about a medical condition or this instruction, always ask your healthcare professional. [employer] disclaims any warranty or liability for your use of this information.  Content Version: 10.0.270728; Last Revised: November 07, 2011

## 2024-05-16 NOTE — Progress Notes (Signed)
 ORTHOPAEDIC NOTE     Christopher [name] L. Christopher Cuyler, PA-C        Christopher [name]    MRN: [id number]  DOB: January 19, 1951    Date of visit: 05/16/2024    Clinic location: [place]     ASSESSMENT:     Satisfactory progress after left thumb Christopher [name] arthroplasty on 04/21/2024     PLAN:     Findings reviewed with Dr. Jakie who agrees with the below treatment plan  Patient understands importance of appropriate immobilization after surgery and discussed option of self-pay OT visit versus reapplication of cast and patient preferred reapplication of cast as he states that his insurance will cover occupational therapy starting on 05/25/2024.  Well molded short thumb spica cast applied in clinic today on the left upper extremity  -Advised OTC analgesic PRN pain  -Discussed treatment options and patient was amenable to the above plan and was instructed to call and be seen if there is any increasing pain or concerns.     Follow up: Dr. Jakie 05/28/2023 with coordinated occupational therapy appointment       Chief Complaint:     Recheck left thumb     SUBJECTIVE:     HPI: Christopher [name] is a right handed 74 y.o. status post left Christopher [name] arthroplasty on 04/21/2024 by Dr. Jakie who is doing well.  He has been in a thumb spica cast and states he has no pain.  He has an appointment for occupational therapy later today however he is unable to afford this secondary to his insurance.  Denies numbness to his thumb.       Allergies  Allergies[1]  Past Medical History  Past Medical History[2]     PHYSICAL EXAM:     Left UE thumb  Inspection: Incision clean dry intact without discharge or drainage, no current evidence of wound dehiscence or significant swelling  Intact sensation along the dorsal radial sensory branch  Brisk capillary refill left thumb     Imaging   no new films obtained today      Dx:  ,                   cc:  Christopher Burnard Cable, MD  *Patient note was created using Dragon Dictation sotware. Errors in syntax or grammar may not have been identified and edited on initial review.           [1]   Allergies  Allergen Reactions    Morphine Hives    Atorvastatin      neuropathy    Atorvastatin Calcium       neuropathy    Baclofen      headache  headache    Ibuprofen      GI bleed    Pneumococcal 23-Valent Polysaccharide Vaccine Other (See Comments)     Redness and pain at injection site. Fatigue  Pt stated his arm swelled within 24hrs after injection    Zetia [Ezetimibe] Other (See Comments)     Pt states it effected his brain  Pt stated it effected his sight, I couldn't drive a car at all   [7]   Past Medical History:  Diagnosis Date    Acute kidney injury 01/12/2018    Anemia     IRON DEFICIENT     Anxiety dont know    Arthritis     Basal cell carcinoma     Cataract     CHF (congestive heart failure) (CMS-HCC)     Chronic  gout of right knee 12/22/2021    Corneal abrasion 1975    steel in right eye with abrasion and removal    Cubital tunnel syndrome on left 01/16/2019    Depression 2016    Diabetes mellitus (CMS-HCC)     Dysphagia     GERD (gastroesophageal reflux disease) 1982    Hypertension     Meningitis (HHS-HCC) 12/25/2015    Mixed hyperlipidemia     Myocardial infarction (CMS-HCC) 1986    Obesity 1986    first back surgery    Peptic ulceration

## 2024-05-26 ENCOUNTER — Other Ambulatory Visit (HOSPITAL_COMMUNITY): Payer: Self-pay

## 2024-05-26 DIAGNOSIS — M549 Dorsalgia, unspecified: Principal | ICD-10-CM

## 2024-05-26 DIAGNOSIS — N182 Chronic kidney disease, stage 2 (mild): Secondary | ICD-10-CM

## 2024-05-26 DIAGNOSIS — G8929 Other chronic pain: Secondary | ICD-10-CM

## 2024-05-26 DIAGNOSIS — M1812 Unilateral primary osteoarthritis of first carpometacarpal joint, left hand: Principal | ICD-10-CM

## 2024-05-26 DIAGNOSIS — E1122 Type 2 diabetes mellitus with diabetic chronic kidney disease: Secondary | ICD-10-CM

## 2024-05-26 MED ORDER — METFORMIN HCL 1000 MG PO TABS
1000.0000 mg | ORAL_TABLET | Freq: Two times a day (BID) | ORAL | 3 refills | Status: AC
  Filled 2024-05-26: qty 180, 90d supply, fill #0

## 2024-05-26 MED ORDER — TROSPIUM CHLORIDE 20 MG PO TABS
20.0000 mg | ORAL_TABLET | Freq: Two times a day (BID) | ORAL | 2 refills | Status: AC
  Filled 2024-05-26: qty 180, 90d supply, fill #0

## 2024-05-26 MED ORDER — TRAZODONE HCL 150 MG PO TABS
150.0000 mg | ORAL_TABLET | Freq: Every evening | ORAL | 3 refills | Status: AC
  Filled 2024-05-26: qty 90, 90d supply, fill #0

## 2024-05-26 MED ORDER — TAMSULOSIN HCL 0.4 MG PO CAPS
0.4000 mg | ORAL_CAPSULE | Freq: Every day | ORAL | 2 refills | Status: AC
  Filled 2024-05-26: qty 90, 90d supply, fill #0

## 2024-05-26 MED ORDER — GABAPENTIN 300 MG PO CAPS
600.0000 mg | ORAL_CAPSULE | Freq: Two times a day (BID) | ORAL | 3 refills | Status: AC
  Filled 2024-05-26: qty 180, 45d supply, fill #0

## 2024-05-26 MED ORDER — METFORMIN 1,000 MG TABLET
ORAL_TABLET | ORAL | 3 refills | 0.00000 days | Status: CP
Start: 2024-05-26 — End: ?

## 2024-05-26 MED ORDER — TROSPIUM 20 MG TABLET
ORAL_TABLET | Freq: Two times a day (BID) | ORAL | 2 refills | 90.00000 days | Status: CP
Start: 2024-05-26 — End: 2025-02-20

## 2024-05-26 MED ORDER — TRAZODONE 150 MG TABLET
ORAL_TABLET | Freq: Every evening | ORAL | 3 refills | 90.00000 days | Status: CP
Start: 2024-05-26 — End: ?

## 2024-05-26 MED ORDER — GABAPENTIN 300 MG CAPSULE
ORAL_CAPSULE | Freq: Two times a day (BID) | ORAL | 3 refills | 45.00000 days | Status: CP
Start: 2024-05-26 — End: ?

## 2024-05-26 MED ORDER — TAMSULOSIN 0.4 MG CAPSULE
ORAL_CAPSULE | Freq: Every day | ORAL | 2 refills | 90.00000 days | Status: CP
Start: 2024-05-26 — End: ?

## 2024-05-26 NOTE — Telephone Encounter (Signed)
 Copied from CRM #[id number]. Topic: Access To Clinicians - Req Clinic Call Back  >> May 26, 2024 11:51 AM Asia A wrote:  Christopher [name] contacted the clinic regarding the following:    - Patient wants all of his prescriptions from [facility] sent over to [facility], their phone [phone].    Please contact Christopher [name] at [phone] once all his prescriptions has been sent over to [facility].    Thanks,    Asia

## 2024-05-26 NOTE — Progress Notes (Signed)
 INTERIM HISTORY:  Christopher [name] returns today for follow up after undergoing a left [facility] arthroplasty on 04/21/2024.  He has been doing very well since his last visit on 05/16/2024.  At that visit, we placed him in a second thumb spica cast as his insurance would not cover OT until 2/1.  He has no particular concerns or questions today.  He denies any pain. He has a appointment with OT after this visit for construction of a hand based thumb spica splint and initiation of ROM.    PHYSICAL EXAMINATION:  EXTREMITIES:   Examination of the left upper extremity shows the incision is healed appropriately.  There is no erythema or drainage. He has normal sensation distally in the distribution of the dorsal radial sensory branch distal to the surgical site.  Sensation elsewhere is grossly normal.  His fingers are warm and well-perfused.  He is able to move his fingers with a DPC of 0.  He has appropriate postoperative swelling.     DIAGNOSTIC DATA:  None today.    ASSESSMENT:   Left thumb carpometacarpal arthroplasty, 04/21/2024.    PLAN: We had a thorough discussion today in clinic with the patient regarding his symptoms and clinical course.  He looks great.  His wound is healing per routine.  He will go to OT for construction of a thermoplastic thumb base spica splint after this visit.  He will begin working on range of motion of his thumb.  In one month he will begin some strengthening.  All of his questions were answered today.  We will see him back in 2 months for repeat evaluation.    Attending Attestation:  I saw and evaluated the patient, participating in the key portions of the service.     I determined the assessment and plan of care for the patient.  I reviewed and agree with the documented findings and plan in the resident's note above.  --J. Megan M. Jakie, MD  May 27, 2024 3:50 PM

## 2024-05-26 NOTE — Telephone Encounter (Signed)
Medication refilled. No further action required.

## 2024-05-26 NOTE — Telephone Encounter (Signed)
 Copied from CRM #[id number]. Topic: Access To Clinicians - Medication Refill  >> May 26, 2024 11:40 AM Dena MATSU wrote:      The patient is requesting the following:     Medication Name(s), Dose/Strengths, and Instructions: metFORMIN  (GLUCOPHAGE ) 1000 MG tablet [[id number]],  traZODone  (DESYREL ) 150 MG tablet [[id number]], sertraline  (ZOLOFT ) 100 MG tablet , omeprazole  (PRILOSEC) 20 MG capsule , gabapentin  (NEURONTIN ) 300 MG capsule,     Quantity for 3 month supply    Pharmacy name and address: [facility]  [address]  [place],  [place]  [address]   Get Driving DirectionsMain: [phone]          Coverage: yes, coverage is accurate on file.    Medication request turnaround time: 72 business hours. (Caller Notified)          Does the caller want to be contacted regarding this request? Yes. Please contact The patient by Cell Phone Telephone Information:  Mobile          [phone]

## 2024-05-26 NOTE — Progress Notes (Unsigned)
 [facility] CENTER   OUTPATIENT OCCUPATIONAL THERAPY    UPPER EXTREMITY                                         Patient Name: Christopher [name]  Date of Birth:08-24-1950  Date: 05/27/2024  Diagnosis: No diagnosis found.  Referring Provider: Delon Bouchard Terra Gander      Visits/Authorization  Visit #: Visit count could not be calculated. Make sure you are using a visit which is associated with an episode.  Completed/Authorized Visits: 0/1  Plan of Care Certification Dates:   05/27/24-08/24/24      Referral Information  Reason for referral: /p left [facility] arthroplasty   Onset of Symptoms: 04/21/24   Per Referring Provider's note: Findings reviewed with Dr. Gander who agrees with the below treatment plan  Patient understands importance of appropriate immobilization after surgery and discussed option of self-pay OT visit versus reapplication of cast and patient preferred reapplication of cast as he states that his insurance will cover occupational therapy starting on 05/25/2024.  Well molded short thumb spica cast applied in clinic today on the left upper extremity  -Advised OTC analgesic PRN pain  -Discussed treatment options and patient was amenable to the above plan and was instructed to call and be seen if there is any increasing pain or concerns.       OT EVALUATION ASSESSMENT:   74 y.o. year old with above diagnosis. *** Patient requires skilled Occupational Therapy services for decreased range of motion, decreased strength, orthotic fit/management, impaired daily activities of living as appropriate.     Prognosis: {REHAB Prognosis:[phone]} due to {Prognosis [phone]}  Communication preference: verbal, written, visual    COMPLEXITY  Low complexity: This patient demonstrates 1-3 performance deficits relating to physical, cognitive, and psychosocial skills resulting in activity limitations and/or participation restrictions.  This patient has no co-morbidities affecting occupational performance.  A problem focused assessment was performed.  Please refer to Current level of function section for further details.     Moderate complexity: This patient demonstrates 3-5 performance deficits relating to physical, cognitive and psychosocial skills that result in activity limitations and/or participation restrictions.  This patient may have comorbidities affecting occupational performance.  Please refer to Current level of function section for further details.     High complexity: This patient demonstrates 5 or more performance deficits relating to physical, cognitive and psychosocial skills that result in activity limitations and/or participation restrictions.  This patient has comorbidities that affect occupational performance.  Please refer to Current level of function section for further details.     GOALS    In *** visits the patient will:     Perform home exercise program with need for cuing to max IND with ADLs and IADLs.   Demonstrate independent donning and doffing and care of orthotic to max joint integrity necessary for ADL completion.   Achieve full composite fist to max IND with grasp and release of daily living tools  Achieve functional composite fist to max IND with grasp and release of daily living tools  Demonstrate active wrist extension/flexion of *** / *** to improve ability to manipulate items for self care and IADL tasks.   Demonstrate active elbow extension/flexion of 0/130 to improve ability to reach for ADL/IADL tools, open doors, and manipulate key items in their environment.   Demonstrate active forearm pronation/supination to 75/75 to maximize ability to  pour, type, carry items, and prepare meals.   Demonstrate active extension/flexion of the *** joint to *** / ***in order to maximize grasp  Improve grip strength to 65% of their contralateral side to max IND with I/ADLs, such as carrying/lifting heavy functional items.  Improve pinch strength to 65% of their contralateral side to maximize ability to hold and manipulate functional ADL/IADL items such as keys, silverware, and grooming tools.   Report =< 2/10 pain score with activities to max IND with I/ADLs, such as writing, lifting/carrying functional items.  Be independent with joint protection principles and use of adaptive equipment to be independent with I/ADLs.  Will demonstrate an 11 point  improvement on the QuickDash representing improved IND in ADL tasks.  (11 points = minimal detectable change)       OT  PLAN OF CARE:      Pt will participate in:  Self Care/Hometraining  Orthotic Fit/Management   Therapeutic Exercise  Therapeutic Activity   Neuromuscular Re-education  Ultrasound  Hot/Cold Pack  Electrical Stimulation  Iontophoresis  Orthotic/Prosthetic Measure and Fit   Joint Mobilization  Physical Performance Measure   Manual Therapy    Planned frequency and duration of treatment: {AMB REHAB THERAPEUTIC [phone]}/ {NUMBERS 1-20:19198} {AMB TIME UNITS REHAB:[phone]}. Plan will be adjusted as necessary.     Patient in agreement with plan of care?: Yes      PRECAUTIONS  Cancer: {YES/NO:21013:::1}  Metal implants:  {YES/NO:21013:::1}  Pacemaker:  {YES/NO:21013:::1}  Diabetes:  {YES/NO:21013:::1}  Latex allergy: {YES/NO:21013:::1}  Pregnancy: {YES/NO:21013:::1}      HEALING TIMELINE / POST OP PROTOCOL    Week 5: 2/2- PROM exercises are added to the MPJ and IPJ of the thumb, with the [facility] joint supported (manually or with an orthosis).     Week 6: 2/9- add isometric resistance for the APB/opponens. On rare occasion, it becomes necessary to add a custom-fabricated dynamic flexion orthosis for the MP and IP joint of the thumb. Any dynamic orthosis must be custom-fabricated to provide maximal support of the [facility] joint and provide the proper alignment and dynamic traction to the MPJ and IPJ. Typically, the orthosis is worn for 20-30 minute sessions 3-4 times a day. The patient is encouraged to begin weaning out of the short opponens orthosis during the day. This can be a gradual process over 2-4 weeks. To leave the orthosis off for light ADLs 3-4 times a day (<= 1 hour)is encouraged. Patients fitted with the wrist and thumb static orthosis at night are encouraged to do so for 8-10 weeks and gradually eliminate by 12 weeks postop. The hand-based custom-fabricated orthosis or a prefabricated neoprene prefabricated orthosis should be worn during the day with repetitive hand activity or activities requiring weighted resistance to the hand and/or upper extremity.    Week 8: 2/23-Gentle strengthening may be initiated when the patient reports concerns for hand and thumb strength. Soft putty can help build endurance in the hand. [Note: The preferred method for recapturing hand and thumb strength is through normal daily activity versus a focus on using putty or a hand exerciser.    Week 10-12: Normal use of hand-gradually    Protocol: [place]  Protocol    SUBJECTIVE:    Patient reports: ***    Prior OT Service: {YES/NO:21137}    Social history:    Occupation: ***   Hobbies/leisure: ***    PLOF:   ADLS: {fxl status:52127}   IADLS: {fxl status:52127}    CLOF:    ADLS: {fxl status:52127}  IADLS: {fxl status:52127}      Pain: {Pain Score:52422}    Sensation: {Sensation:[phone]}    OBJECTIVE:      Upper Extremity Function:    Shoulder:  {AROM/PROM:[phone]}  {Strength/MMT:51501}    Elbow:  {Elbow:[phone]}  {Strength/MMT:51501}    Hand Dominance: {Left right alternating :55426}               AROM (degrees) Date: ***  Right Date: ***  Left   Wrist     Extension/flexion     Radial/ulnar deviation     Forearm     Supination/pronation     Elbow     Ext/flex     Composite flex to DPC   (cm lack)     Index     Middle     Ring     Small     Digit Extension     Thumb  Opposition        Digit ROM Index finger Long finger Ring finger Small finger   MCP ext/flex       PIP ext/flex       DIP ext/flex       TAM total         Thumb ROM    MP ext/flex    IP ext/flex Radial abduction     Palmar abduction     TAM total        Grip and Pinch Strength Date:***  Right Date:***  Left   Gross Grip Strength (lbs)  Position 2     Trial 1     Trial 2     Trial 3          Average     Pinch Strength (lbs)     Lateral Pinch     3-point Pinch     Tip Pinch         Other notes: *** Testing deferred due to protocol / pain     Norms: It is the position of the [facility] that while validated and tested outcome measures are important in clinical testing formal grip/pinch strength is NOT an accurate measure of patient functioning. This is due to several factors including but not limited to hormonal fluctuations caused by natural aging, gender affirming care, the presence of underlying or undetected medial conditions, and the historically poor representation of several populations throughout normative testing. Therefore functional deficits should be prioritized when considering deficits or impairments.     {Hand OT Norms:[phone]}    OBSERVATION    Wound/Incision(s): {Exam; wound:13612} Incision covered with steri strips  Scar: {Scar:[phone]}    Edema: {AMB OT Zizfj:[id number]}    Educated pt on the need to perform active range of motion to allow adequate venous return and prevent stiffness.     FUNCTIONAL OUTCOME MEASURES    {Tests:[phone]}    Quick Dash     Rate from 1 (no difficulty) to 5 (unable)     1: Open a tight/new jar   ***    2: Do heavy household chores  ***    3: Carry a shopping bag or brief case  ***    4: Wash your back  ***    5: Use a knife to cut food  ***    6: Recreational activities which take some force or impact through arm/shoulder/hand  ***    Rate from 1 (not at all) to 5 (extremely)      7: In the past week to what extend did your arm/shoulder/hand interfere  with social activities  ***    Rate from 1 (not at all) to 5 (unable)      8: In the past week how limited were you in your work or regular daily activities because of your hand/shoulder/arm  ***    Rate from 1 (none) to 5 (extreme)     9:  Arm/shoulder/hand pain  ***    10: Tingling/pins and needles in arm/shoulder/hand  ***    Rate from 1 (no difficulty) to 5 (unable)      11: During the past week how much difficulty have you had sleeping because of your arm/shoulder/hand  ***                                                         Raw Total  ***                                     Percent (%) disability  ***    MDC for upper extremity musculoskeletal disorders ([place], 2010) = 11    TREATMENT:  Orthotic fit and training (*** mins)   Fabrication of a custom *** orthosis was completed as per MD order to address ***. Pt was educated in donning/doffing procedures, wear and care, skin care, and hygiene. Pt instructed to wear the orthosis ***. Pt provided with number for this clinic and instruction to reach out should they experience irritation of the skin, alteration of fit, or alternate splint concern. Pt verbalized understanding of all of the above.     Self Care/home training (*** minutes):  Therapist issued HEP with patient demonstration (see below) with handouts provided to the patient.   Educated pt on weightbearing precautions and healing time-line.    Home Program:   Apply low to moderate heat 10 min prior to exercises for improved tissue extensibility  Initiated home training    Patient Education:  Topics: home program, disease process  Education Provided to: {PATIENT/FAMILY/CAREGIVER/SO:21563}  Education Type: education, demonstration, literature  Response to education/teachback: verbal understanding received, return demonstration      TREATMENT RENDERED  Today's Charges (noted here with $$):                           Total Time:  *** minutes      I attest that I have reviewed the above information.  Signed: Donnice FORBES Mooring, OT  05/27/2024 4:43 PM    I reviewed the no-show/attendance policy with the patient and caregiver(s). The family is aware that they must call to cancel appointments more than 24 hours in advance. They are also aware that if they late cancel or no-show three times, we reserve the right to cancel their remaining appointments. This policy is in place to allow us  to best serve the needs of our caseload.    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    Corneal abrasion 1975    steel in right eye with abrasion and removal    Cubital tunnel syndrome on left 01/16/2019    Depression 2016    Diabetes mellitus (CMS-HCC)     Dysphagia     GERD (gastroesophageal reflux disease) 1982    Hypertension     Meningitis (HHS-HCC) 12/25/2015    Mixed hyperlipidemia     Myocardial infarction (CMS-HCC) 1986    Obesity 1986    first back surgery    Peptic ulceration    [2]   Past Surgical History:  Procedure Laterality Date    BACK SURGERY      CARDIAC CATHETERIZATION      STENTS PLACEMENT     CARDIAC SURGERY  2005    stents    CORONARY STENT PLACEMENT      ESOPHAGEAL DILATION      x 2    FOREIGN BODY REMOVAL Right about 20 years ago    HERNIA REPAIR      RIGHT INGUNIAL    JOINT REPLACEMENT Left     TOTAL SHOULDER    PR ARM/ELBOW TENDON LENGTHEN,SINGLE,EA Left 02/17/2019    Procedure: TENDON LENGTHENING UPPER ARM/ELBOW SNGL EA;  Surgeon: Delon Duwaine Terra Jakie, MD;  Location: ASC OR [facility];  Service: Orthopedics    PR ARTHRP INTERCARPAL/CARP/MTCRPL JT INTERPOSITION Left 04/21/2024    Procedure: ARTHROPLASTY, INTERCARPAL OR CARPOMETACARPAL JOINTS; INTERPOSITION (EG, TENDON);  Surgeon: Jakie Delon Duwaine Terra, MD;  Location: OR [facility];  Service: Orthopedics    PR CABG, ARTERIAL, SINGLE Midline 03/20/2022    Procedure: CORONARY ARTERY BYPASS, USING ARTERIAL GRAFT(S); SINGLE ARTERIAL GRAFT;  Surgeon: Ranelle Debby Bare, MD;  Location: MAIN OR [facility];  Service: Cardiac Surgery    PR CABG, ARTERY-VEIN, SINGLE Midline 03/20/2022    Procedure: CORONARY ARTERY BYPASS, USING VENOUS GRAFT(S) AND ARTERIAL GRAFT(S); SINGLE VEIN GRAFT;  Surgeon: Ranelle Debby Bare, MD;  Location: MAIN OR [facility];  Service: Cardiac Surgery    PR CATH PLACE/CORON ANGIO, IMG SUPER/INTERP,W LEFT HEART VENTRICULOGRAPHY N/A 02/01/2022    Procedure: Left Heart Catheterization;  Surgeon: Pia Sharper, MD;  Location: [facility] CATH;  Service: Cardiology    PR ENDOSCOPY W/VIDEO-ASST VEIN HARVEST,CABG Right 03/20/2022    Procedure: ENDOSCOPY, SURGICAL, INCLUDING VIDEO-ASSISTED HARVEST OF VEIN(S) FOR CORONARY ARTERY BYPASS PROCEDURE;  Surgeon: Ranelle Debby Bare, MD;  Location: MAIN OR [facility];  Service: Cardiac Surgery    PR OSTEOTOMY 1ST METATARSAL,BASE/SHAFT Left 07/20/2017    Procedure: OSTEOTOMY, W/WO LENGTHENING, SHORTENING OR ANGULAR CORRECTION, METATARSAL; FIRST METATARSAL;  Surgeon: Fonda Rosalynn Cooler, MD;  Location: ASC OR [facility];  Service: Ortho Foot & Ankle    PR OSTEOTOMY HEEL BONE Left 07/20/2017    Procedure: OSTEOTOMYMERL MC W/WO INT FIXA;  Surgeon: Fonda Rosalynn Cooler, MD;  Location: ASC OR [facility];  Service: Ortho Foot & Ankle    PR OSTEOTOMY METATARSAL (NOT 1ST) Left 06/06/2018    Procedure: OSTEOTOMY, W/WO LENGTHENING, SHORTENING OR ANGULAR CORRECTION, METATARSAL; OTHER THAN FIRST METATARSAL, EA;  Surgeon: Fonda Rosalynn Cooler, MD;  Location: ASC OR [facility];  Service: Orthopedics    PR PART REMV PHALANX OF TOE Left 06/06/2018    Procedure: PART  EXC BONE PHALANX TOE;  Surgeon: Fonda Rosalynn Cooler, MD;  Location: ASC OR [facility];  Service: Orthopedics    PR RECONSTR TOTAL SHOULDER IMPLANT Left 11/12/2018    Procedure: R28   ARTHROPLASTY, GLENOHUMERAL JOINT; TOTAL SHOULDER(GLENOID & PROXIMAL HUMERAL REPLACEMENT(EG, TOTAL SHOULDER);  Surgeon: Lamar Marsa Hamming, MD;  Location: [facility] OR [facility];  Service: Orthopedics    PR REMOVAL IMPLANT DEEP Left 11/12/2018    Procedure: REMOVE IMPLANT; DEEP SHOULDER;  Surgeon: Lamar Marsa Hamming, MD;  Location: [facility] OR [facility];  Service: Orthopedics    PR REPAIR BICEPS LONG TENDON Left 11/12/2018    Procedure: TENODESIS LONG TENDON BICEPS;  Surgeon: Lamar Marsa Hamming, MD;  Location: [facility] OR [facility];  Service: Orthopedics    PR REPAIR PERONEAL TENDONS,FIB OSTEOTMY Left 07/20/2017    Procedure: REPR DISLOC PERONEAL TENDONS; W/FIB OSTEOTOMY;  Surgeon: Fonda Rosalynn Cooler, MD;  Location: ASC OR [facility];  Service: Ortho Foot & Ankle    PR REVISE MEDIAN N/CARPAL TUNNEL SURG Left 02/17/2019    Procedure: R22 NEUROPLASTY AND/OR TRANSPOSITION; MEDIAN NERVE AT CARPAL TUNNEL;  Surgeon: Delon Bouchard Terra Gander, MD;  Location: ASC OR [facility];  Service: Orthopedics    PR REVISE ULNAR NERVE AT ELBOW Left 02/17/2019    Procedure: NEUROPLASTY AND/OR TRANSPOSITION; ULNAR NERVE AT ELBOW;  Surgeon: Delon Bouchard Terra Gander, MD;  Location: ASC OR [facility];  Service: Orthopedics    PR TRANSPLANT HAND TENDON Left 04/21/2024    Procedure: TRANSFER OR TRANSPLANT OF TENDON, CARPOMETACARPAL AREA OR DORSUM OF HAND; WITHOUT FREE GRAFT, EACH TENDON;  Surgeon: Gander Delon Bouchard Terra, MD;  Location: OR [facility] [place];  Service: Orthopedics    PR UP GI ENDOSCOPY,BALL DIL,30MM N/A 12/20/2023    Procedure: UGI ENDO; W/BALLOON DILAT ESOPHAGUS (<30MM DIAM);  Surgeon: Audrey Lacinda Hun, MD;  [facility];  Service: Gastroenterology    PR UPPER GI ENDOSCOPY,BIOPSY N/A 12/14/2023    Procedure: UGI ENDOSCOPY; WITH BIOPSY, SINGLE OR MULTIPLE;  Surgeon: Celestina Comer Norris, MD;  Location: [facility] PROCEDURES [facility]; Service: Gastroenterology    PR UPPER GI ENDOSCOPY,BIOPSY N/A 12/20/2023    Procedure: UGI ENDOSCOPY; WITH BIOPSY, SINGLE OR MULTIPLE;  Surgeon: Audrey Lacinda Hun, MD;  [facility]: [facility];  Service: Gastroenterology    SHOULDER SURGERY Bilateral     L TSA, R ROTATOR CUFF      SKIN BIOPSY      SPINE SURGERY  1986/1995/2000/2002    LUMBAR FUSION    stent     [3]   Current Outpatient Medications:     amlodipine  (NORVASC ) 10 MG tablet, Take 1 tablet (10 mg total) by mouth daily., Disp: 100 tablet, Rfl: 3    aspirin  (ECOTRIN) 81 MG tablet, Take 1 tablet (81 mg total) by mouth daily., Disp: , Rfl:     blood glucose control, low (TRUE METRIX LEVEL 1) Soln, USE FOR GLUCOMETER MACHINE AS DIRECTED, Disp: 1 each, Rfl: 3    evolocumab  (REPATHA  SURECLICK) 140 mg/mL PnIj, Inject the contents of one pen (140 mg) under the skin every fourteen (14) days., Disp: 6 mL, Rfl: 3    fluticasone  propionate (FLONASE ) 50 mcg/actuation nasal spray, 2 sprays into each nostril daily., Disp: 48 mL, Rfl: 3    gabapentin  (NEURONTIN ) 300 MG capsule, Take 2 capsules (600 mg total) by mouth two (2) times a day., Disp: 180 capsule, Rfl: 3    JARDIANCE  10 mg tablet, Take 1 tablet (10 mg total) by mouth daily., Disp: 90  tablet, Rfl: 3    metFORMIN  (GLUCOPHAGE ) 1000 MG tablet, TAKE 1 TABLET EVERY MORNING AND TAKE 1 TABLET EVERY EVENING (TAKE WITH MEALS), Disp: 180 tablet, Rfl: 3    metoPROLOL  tartrate (LOPRESSOR ) 25 MG tablet, TAKE 1 TABLET TWICE DAILY, Disp: 180 tablet, Rfl: 2    omeprazole  (PRILOSEC) 20 MG capsule, TAKE 1 CAPSULE (20 MG TOTAL) BY MOUTH DAILY., Disp: 90 capsule, Rfl: 3    ondansetron  (ZOFRAN ) 4 MG tablet, Take 1 tablet (4 mg total) by mouth every eight (8) hours as needed for nausea. (Patient not taking: Reported on 05/16/2024), Disp: 8 tablet, Rfl: 0    predniSONE  (DELTASONE ) 10 MG tablet, 1.5 tab po q day, Disp: 90 tablet, Rfl: 3    sertraline  (ZOLOFT ) 100 MG tablet, Take 1 tablet (100 mg total) by mouth daily., Disp: 90 tablet, Rfl: 1    tamsulosin  (FLOMAX ) 0.4 mg capsule, Take 1 capsule (0.4 mg total) by mouth daily., Disp: 90 capsule, Rfl: 2    traZODone  (DESYREL ) 150 MG tablet, Take 1 tablet (150 mg total) by mouth nightly., Disp: 90 tablet, Rfl: 3    trospium  (SANCTURA ) 20 mg tablet, Take 1 tablet (20 mg total) by mouth two (2) times a day., Disp: 180 tablet, Rfl: 2    TRUE METRIX GLUCOSE TEST STRIP Strp, CHECK BLOOD SUGAR AS DIRECTED ONCE A DAY AND FOR SYMPTOMS OF HIGH OR LOW BLOOD SUGAR., Disp: 100 strip, Rfl: 3    TRUEPLUS LANCETS 33 gauge Misc, TEST BLOOD SUGAR EVERY DAY, Disp: 100 each, Rfl: 3

## 2024-05-26 NOTE — Addendum Note (Signed)
 Addended by: KAROLINE RABON D on: 05/26/2024 02:24 PM     Modules accepted: Orders

## 2024-05-27 ENCOUNTER — Other Ambulatory Visit (HOSPITAL_COMMUNITY): Payer: Self-pay

## 2024-05-27 ENCOUNTER — Ambulatory Visit
Admit: 2024-05-27 | Discharge: 2024-05-28 | Payer: Medicare (Managed Care) | Attending: Orthopaedic Surgery | Primary: Orthopaedic Surgery

## 2024-05-27 DIAGNOSIS — M1812 Unilateral primary osteoarthritis of first carpometacarpal joint, left hand: Principal | ICD-10-CM

## 2024-05-27 NOTE — Progress Notes (Unsigned)
 [facility] HAND CENTER   OUTPATIENT OCCUPATIONAL THERAPY    UPPER EXTREMITY   INITIAL EVALUATION                                      Patient Name: Christopher [name]  Date of Birth:1951-01-16  Date: 05/27/2024  Diagnosis:   Encounter Diagnosis   Name Primary?    Arthritis of carpometacarpal [facility]) joint of left thumb      Referring Provider: Delon Bouchard Terra Gander      Visits/Authorization  Visit #: Visit count could not be calculated. Make sure you are using a visit which is associated with an episode.  Completed/Authorized Visits: 1/99  Plan of Care Certification Dates:   05/27/24-08/24/24      Referral Information  Reason for referral: /p left [facility] arthroplasty   Onset of Symptoms: 04/21/24   Per Referring Provider's note: Findings reviewed with Dr. Gander who agrees with the below treatment plan  Patient understands importance of appropriate immobilization after surgery and discussed option of self-pay OT visit versus reapplication of cast and patient preferred reapplication of cast as he states that his insurance will cover occupational therapy starting on 05/25/2024.  Well molded short thumb spica cast applied in clinic today on the left upper extremity  -Advised OTC analgesic PRN pain  -Discussed treatment options and patient was amenable to the above plan and was instructed to call and be seen if there is any increasing pain or concerns.       OT EVALUATION ASSESSMENT:   74 y.o. year old with above diagnosis. *** Patient requires skilled Occupational Therapy services for decreased range of motion, decreased strength, orthotic fit/management, impaired daily activities of living as appropriate.     Prognosis: {REHAB Prognosis:[phone]} due to {Prognosis Rationale:[phone]}  Communication preference: verbal, written, visual    COMPLEXITY  Low complexity: This patient demonstrates 1-3 performance deficits relating to physical, cognitive, and psychosocial skills resulting in activity limitations and/or participation restrictions.  This patient has no co-morbidities affecting occupational performance.  A problem focused assessment was performed.  Please refer to Current level of function section for further details.     Moderate complexity: This patient demonstrates 3-5 performance deficits relating to physical, cognitive and psychosocial skills that result in activity limitations and/or participation restrictions.  This patient may have comorbidities affecting occupational performance.  Please refer to Current level of function section for further details.     High complexity: This patient demonstrates 5 or more performance deficits relating to physical, cognitive and psychosocial skills that result in activity limitations and/or participation restrictions.  This patient has comorbidities that affect occupational performance.  Please refer to Current level of function section for further details.     GOALS    In *** visits the patient will:     Perform home exercise program with need for cuing to max IND with ADLs and IADLs.   Demonstrate independent donning and doffing and care of orthotic to max joint integrity necessary for ADL completion.   Achieve full composite fist to max IND with grasp and release of daily living tools  Achieve functional composite fist to max IND with grasp and release of daily living tools  Demonstrate active wrist extension/flexion of *** / *** to improve ability to manipulate items for self care and IADL tasks.   Demonstrate active elbow extension/flexion of 0/130 to improve ability to reach for ADL/IADL tools,  open doors, and manipulate key items in their environment.   Demonstrate active forearm pronation/supination to 75/75 to maximize ability to pour, type, carry items, and prepare meals.   Demonstrate active extension/flexion of the *** joint to *** / ***in order to maximize grasp  Improve grip strength to 65% of their contralateral side to max IND with I/ADLs, such as carrying/lifting heavy functional items.  Improve pinch strength to 65% of their contralateral side to maximize ability to hold and manipulate functional ADL/IADL items such as keys, silverware, and grooming tools.   Report =< 2/10 pain score with activities to max IND with I/ADLs, such as writing, lifting/carrying functional items.  Be independent with joint protection principles and use of adaptive equipment to be independent with I/ADLs.  Will demonstrate an 11 point  improvement on the QuickDash representing improved IND in ADL tasks.  (11 points = minimal detectable change)       OT  PLAN OF CARE:      Pt will participate in:  Self Care/Hometraining  Orthotic Fit/Management   Therapeutic Exercise  Therapeutic Activity   Neuromuscular Re-education  Ultrasound  Hot/Cold Pack  Electrical Stimulation  Iontophoresis  Orthotic/Prosthetic Measure and Fit   Joint Mobilization  Physical Performance Measure   Manual Therapy    Planned frequency and duration of treatment: {AMB REHAB THERAPEUTIC [phone]}/ {NUMBERS 1-20:19198} {AMB TIME UNITS REHAB:[phone]}. Plan will be adjusted as necessary.     Patient in agreement with plan of care?: Yes      PRECAUTIONS  Cancer: YES/NO: No.  Metal implants:  YES/NO: Yes.  Pacemaker:  YES/NO: No.  Diabetes:  YES/NO: Yes.  Latex allergy: YES/NO: No.  Pregnancy: YES/NO: No.      HEALING TIMELINE / POST OP PROTOCOL    Week 5: 2/2- PROM exercises are added to the MPJ and IPJ of the thumb, with the [facility] joint supported (manually or with an orthosis).     Week 6: 2/9- add isometric resistance for the APB/opponens. On rare occasion, it becomes necessary to add a custom-fabricated dynamic flexion orthosis for the MP and IP joint of the thumb. Any dynamic orthosis must be custom-fabricated to provide maximal support of the [facility] joint and provide the proper alignment and dynamic traction to the MPJ and IPJ. Typically, the orthosis is worn for 20-30 minute sessions 3-4 times a day. The patient is encouraged to begin weaning out of the short opponens orthosis during the day. This can be a gradual process over 2-4 weeks. To leave the orthosis off for light ADLs 3-4 times a day (<= 1 hour)is encouraged. Patients fitted with the wrist and thumb static orthosis at night are encouraged to do so for 8-10 weeks and gradually eliminate by 12 weeks postop. The hand-based custom-fabricated orthosis or a prefabricated neoprene prefabricated orthosis should be worn during the day with repetitive hand activity or activities requiring weighted resistance to the hand and/or upper extremity.    Week 8: 2/23-Gentle strengthening may be initiated when the patient reports concerns for hand and thumb strength. Soft putty can help build endurance in the hand. [Note: The preferred method for recapturing hand and thumb strength is through normal daily activity versus a focus on using putty or a hand exerciser.    Week 10-12: Normal use of hand-gradually    Protocol: [place]  Protocol    SUBJECTIVE:    Patient reports: he has to get back to normal. He usually beats physical therapy and gets his motion back before exercises  Prior OT Service: no    Social history:    Occupation: retired, does yard work, looking for a new job   Hobbies/leisure: likes to engineer, technical sales    PLOF:   ADLS: Independent   IADLS: Independent    CLOF:    ADLS: Independent   IADLS: Independent      Pain: 2/10    Sensation: intact to light touch:      OBJECTIVE:      Upper Extremity Function:    Shoulder:  WNL      Elbow:  WNL      Hand Dominance: right               AROM (degrees) Date:   Right Date: 05/27/24  Left   Wrist     Extension/flexion  50/35   Radial/ulnar deviation  20/25   Forearm     Supination/pronation  wnl   Elbow     Ext/flex     Composite flex to DPC   (cm lack)  Full arom   Index     Middle     Ring     Small     Digit Extension     Thumb  Opposition   To ring tip     Digit ROM Index finger Long finger Ring finger Small finger MCP ext/flex       PIP ext/flex       DIP ext/flex       TAM total         Thumb ROM    MP ext/flex    IP ext/flex    Radial abduction     Palmar abduction     TAM total        Grip and Pinch Strength Date:  Right Date:***  Left   Gross Grip Strength (lbs)  Position 2     Trial 1     Trial 2     Trial 3          Average     Pinch Strength (lbs)     Lateral Pinch     3-point Pinch     Tip Pinch         Other notes: *** Testing deferred due to protocol / pain     Norms: It is the position of the [facility] that while validated and tested outcome measures are important in clinical testing formal grip/pinch strength is NOT an accurate measure of patient functioning. This is due to several factors including but not limited to hormonal fluctuations caused by natural aging, gender affirming care, the presence of underlying or undetected medial conditions, and the historically poor representation of several populations throughout normative testing. Therefore functional deficits should be prioritized when considering deficits or impairments.     {Hand OT Norms:[phone]}    OBSERVATION    Wound/Incision(s): {Exam; wound:13612} Incision covered with steri strips  Scar: {[phone]}    Edema: {AMB OT Zizfj:[id number]}    Educated pt on the need to perform active range of motion to allow adequate venous return and prevent stiffness.     FUNCTIONAL OUTCOME MEASURES    {Tests:[phone]}    Quick Dash     Rate from 1 (no difficulty) to 5 (unable)     1: Open a tight/new jar   ***    2: Do heavy household chores  ***    3: Carry a shopping bag or brief case  ***    4: Wash your back  ***  5: Use a knife to cut food  ***    6: Recreational activities which take some force or impact through arm/shoulder/hand  ***    Rate from 1 (not at all) to 5 (extremely)      7: In the past week to what extend did your arm/shoulder/hand interfere with social activities  ***    Rate from 1 (not at all) to 5 (unable)      8: In the past week how limited were you in your work or regular daily activities because of your hand/shoulder/arm  ***    Rate from 1 (none) to 5 (extreme)     9:  Arm/shoulder/hand pain  ***    10: Tingling/pins and needles in arm/shoulder/hand  ***    Rate from 1 (no difficulty) to 5 (unable)      11: During the past week how much difficulty have you had sleeping because of your arm/shoulder/hand  ***                                                         Raw Total  ***                                     Percent (%) disability  ***    MDC for upper extremity musculoskeletal disorders ([place], 2010) = 11    TREATMENT:  Orthotic fit and training (*** mins)   Fabrication of a custom *** orthosis was completed as per MD order to address ***. Pt was educated in donning/doffing procedures, wear and care, skin care, and hygiene. Pt instructed to wear the orthosis ***. Pt provided with number for this clinic and instruction to reach out should they experience irritation of the skin, alteration of fit, or alternate splint concern. Pt verbalized understanding of all of the above.     Self Care/home training (*** minutes):  Therapist issued HEP with patient demonstration (see below) with handouts provided to the patient.   Educated pt on weightbearing precautions and healing time-line.    Home Program:   Apply low to moderate heat 10 min prior to exercises for improved tissue extensibility  Initiated home training    Patient Education:  Topics: home program, disease process  Education Provided to: {PATIENT/FAMILY/CAREGIVER/SO:21563}  Education Type: education, demonstration, literature  Response to education/teachback: verbal understanding received, return demonstration      TREATMENT RENDERED  Today's Charges (noted here with $$):                           Total Time:  *** minutes      I attest that I have reviewed the above information.  Signed: Arne LELON Close, OT  05/27/2024 2:21 PM    I reviewed the no-show/attendance policy with the patient and caregiver(s). The family is aware that they must call to cancel appointments more than 24 hours in advance. They are also aware that if they late cancel or no-show three times, we reserve the right to cancel their remaining appointments. This policy is in place to allow us  to best serve the needs of our caseload.    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    Corneal abrasion 1975    steel in right eye with abrasion and removal    Cubital tunnel syndrome on left 01/16/2019    Depression 2016    Diabetes mellitus (CMS-HCC)     Dysphagia     GERD (gastroesophageal reflux disease) 1982    Hypertension     Meningitis (HHS-HCC) 12/25/2015    Mixed hyperlipidemia     Myocardial infarction (CMS-HCC) 1986    Obesity 1986    first back surgery    Peptic ulceration    [2]   Past Surgical History:  Procedure Laterality Date    BACK SURGERY      CARDIAC CATHETERIZATION      STENTS PLACEMENT     CARDIAC SURGERY  2005    stents    CORONARY STENT PLACEMENT      ESOPHAGEAL DILATION      x 2    FOREIGN BODY REMOVAL Right about 20 years ago    HERNIA REPAIR      RIGHT INGUNIAL    JOINT REPLACEMENT Left     TOTAL SHOULDER    PR ARM/ELBOW TENDON LENGTHEN,SINGLE,EA Left 02/17/2019    Procedure: TENDON LENGTHENING UPPER ARM/ELBOW SNGL EA;  Surgeon: Delon Duwaine Terra Jakie, MD;  Location: ASC OR [facility];  Service: Orthopedics    PR ARTHRP INTERCARPAL/CARP/MTCRPL JT INTERPOSITION Left 04/21/2024    Procedure: ARTHROPLASTY, INTERCARPAL OR CARPOMETACARPAL JOINTS; INTERPOSITION (EG, TENDON);  Surgeon: Jakie Delon Duwaine Terra, MD;  Location: OR [facility];  Service: Orthopedics    PR CABG, ARTERIAL, SINGLE Midline 03/20/2022    Procedure: CORONARY ARTERY BYPASS, USING ARTERIAL GRAFT(S); SINGLE ARTERIAL GRAFT;  Surgeon: Ranelle Debby Bare, MD;  Location: MAIN OR [facility];  Service: Cardiac Surgery    PR CABG, ARTERY-VEIN, SINGLE Midline 03/20/2022    Procedure: CORONARY ARTERY BYPASS, USING VENOUS GRAFT(S) AND ARTERIAL GRAFT(S); SINGLE VEIN GRAFT;  Surgeon: Ranelle Debby Bare, MD;  Location: MAIN OR [facility];  Service: Cardiac Surgery    PR CATH PLACE/CORON ANGIO, IMG SUPER/INTERP,W LEFT HEART VENTRICULOGRAPHY N/A 02/01/2022    Procedure: Left Heart Catheterization;  Surgeon: Pia Sharper, MD;  Location: [facility] CATH;  Service: Cardiology    PR ENDOSCOPY W/VIDEO-ASST VEIN HARVEST,CABG Right 03/20/2022    Procedure: ENDOSCOPY, SURGICAL, INCLUDING VIDEO-ASSISTED HARVEST OF VEIN(S) FOR CORONARY ARTERY BYPASS PROCEDURE;  Surgeon: Ranelle Debby Bare, MD;  Location: MAIN OR [facility];  Service: Cardiac Surgery    PR OSTEOTOMY 1ST METATARSAL,BASE/SHAFT Left 07/20/2017    Procedure: OSTEOTOMY, W/WO LENGTHENING, SHORTENING OR ANGULAR CORRECTION, METATARSAL; FIRST METATARSAL;  Surgeon: Fonda Rosalynn Cooler, MD;  Location: ASC OR [facility];  Service: Ortho Foot & Ankle    PR OSTEOTOMY HEEL BONE Left 07/20/2017    Procedure: OSTEOTOMYMERL MC W/WO INT FIXA;  Surgeon: Fonda Rosalynn Cooler, MD;  Location: ASC OR [facility];  Service: Ortho Foot & Ankle  PR OSTEOTOMY METATARSAL (NOT 1ST) Left 06/06/2018    Procedure: OSTEOTOMY, W/WO LENGTHENING, SHORTENING OR ANGULAR CORRECTION, METATARSAL; OTHER THAN FIRST METATARSAL, EA;  Surgeon: Fonda Rosalynn Cooler, MD;  Location: ASC OR [facility];  Service: Orthopedics    PR PART REMV PHALANX OF TOE Left 06/06/2018    Procedure: PART EXC BONE PHALANX TOE; Surgeon: Fonda Rosalynn Cooler, MD;  Location: ASC OR [facility];  Service: Orthopedics    PR RECONSTR TOTAL SHOULDER IMPLANT Left 11/12/2018    Procedure: R28   ARTHROPLASTY, GLENOHUMERAL JOINT; TOTAL SHOULDER(GLENOID & PROXIMAL HUMERAL REPLACEMENT(EG, TOTAL SHOULDER);  Surgeon: Lamar Marsa Hamming, MD;  Location: [facility] OR [facility];  Service: Orthopedics    PR REMOVAL IMPLANT DEEP Left 11/12/2018    Procedure: REMOVE IMPLANT; DEEP SHOULDER;  Surgeon: Lamar Marsa Hamming, MD;  Location: [facility] OR [facility];  Service: Orthopedics    PR REPAIR BICEPS LONG TENDON Left 11/12/2018    Procedure: TENODESIS LONG TENDON BICEPS;  Surgeon: Lamar Marsa Hamming, MD;  Location: [facility] OR [facility];  Service: Orthopedics    PR REPAIR PERONEAL TENDONS,FIB OSTEOTMY Left 07/20/2017    Procedure: REPR DISLOC PERONEAL TENDONS; W/FIB OSTEOTOMY;  Surgeon: Fonda Rosalynn Cooler, MD;  Location: ASC OR [facility];  Service: Ortho Foot & Ankle    PR REVISE MEDIAN N/CARPAL TUNNEL SURG Left 02/17/2019    Procedure: R22 NEUROPLASTY AND/OR TRANSPOSITION; MEDIAN NERVE AT CARPAL TUNNEL;  Surgeon: Delon Bouchard Terra Gander, MD;  Location: ASC OR [facility];  Service: Orthopedics    PR REVISE ULNAR NERVE AT ELBOW Left 02/17/2019    Procedure: NEUROPLASTY AND/OR TRANSPOSITION; ULNAR NERVE AT ELBOW;  Surgeon: Delon Bouchard Terra Gander, MD;  Location: ASC OR [facility];  Service: Orthopedics    PR TRANSPLANT HAND TENDON Left 04/21/2024    Procedure: TRANSFER OR TRANSPLANT OF TENDON, CARPOMETACARPAL AREA OR DORSUM OF HAND; WITHOUT FREE GRAFT, EACH TENDON;  Surgeon: Gander Delon Bouchard Terra, MD;  Location: OR [facility];  Service: Orthopedics    PR UP GI ENDOSCOPY,BALL DIL,30MM N/A 12/20/2023    Procedure: UGI ENDO; W/BALLOON DILAT ESOPHAGUS (<30MM DIAM);  Surgeon: Audrey Lacinda Hun, MD;  [facility];  Service: Gastroenterology    PR UPPER GI ENDOSCOPY,BIOPSY N/A 12/14/2023    Procedure: UGI ENDOSCOPY; WITH BIOPSY, SINGLE OR MULTIPLE;  Surgeon: Celestina Comer Norris, MD;  Location: [facility] PROCEDURES [facility];  Service: Gastroenterology    PR UPPER GI ENDOSCOPY,BIOPSY N/A 12/20/2023    Procedure: UGI ENDOSCOPY; WITH BIOPSY, SINGLE OR MULTIPLE;  Surgeon: Audrey Lacinda Hun, MD;  [facility];  Service: Gastroenterology    SHOULDER SURGERY Bilateral     L TSA, R ROTATOR CUFF      SKIN BIOPSY      SPINE SURGERY  1986/1995/2000/2002    LUMBAR FUSION    stent     [3]   Current Outpatient Medications:     amlodipine  (NORVASC ) 10 MG tablet, Take 1 tablet (10 mg total) by mouth daily., Disp: 100 tablet, Rfl: 3    aspirin  (ECOTRIN) 81 MG tablet, Take 1 tablet (81 mg total) by mouth daily., Disp: , Rfl:     blood glucose control, low (TRUE METRIX LEVEL 1) Soln, USE FOR GLUCOMETER MACHINE AS DIRECTED, Disp: 1 each, Rfl: 3    evolocumab  (REPATHA  SURECLICK) 140 mg/mL PnIj, Inject the contents of one pen (140 mg) under the skin every fourteen (14) days., Disp: 6 mL, Rfl: 3    fluticasone  propionate (FLONASE ) 50 mcg/actuation nasal spray, 2  sprays into each nostril daily., Disp: 48 mL, Rfl: 3    gabapentin  (NEURONTIN ) 300 MG capsule, Take 2 capsules (600 mg total) by mouth two (2) times a day., Disp: 180 capsule, Rfl: 3    JARDIANCE  10 mg tablet, Take 1 tablet (10 mg total) by mouth daily., Disp: 90 tablet, Rfl: 3    metFORMIN  (GLUCOPHAGE ) 1000 MG tablet, TAKE 1 TABLET EVERY MORNING AND TAKE 1 TABLET EVERY EVENING (TAKE WITH MEALS), Disp: 180 tablet, Rfl: 3    metoPROLOL  tartrate (LOPRESSOR ) 25 MG tablet, TAKE 1 TABLET TWICE DAILY, Disp: 180 tablet, Rfl: 2    omeprazole  (PRILOSEC) 20 MG capsule, TAKE 1 CAPSULE (20 MG TOTAL) BY MOUTH DAILY., Disp: 90 capsule, Rfl: 3    ondansetron  (ZOFRAN ) 4 MG tablet, Take 1 tablet (4 mg total) by mouth every eight (8) hours as needed for nausea. (Patient not taking: Reported on 05/16/2024), Disp: 8 tablet, Rfl: 0    predniSONE  (DELTASONE ) 10 MG tablet, 1.5 tab po q day, Disp: 90 tablet, Rfl: 3    sertraline  (ZOLOFT ) 100 MG tablet, Take 1 tablet (100 mg total) by mouth daily., Disp: 90 tablet, Rfl: 1    tamsulosin  (FLOMAX ) 0.4 mg capsule, Take 1 capsule (0.4 mg total) by mouth daily., Disp: 90 capsule, Rfl: 2    traZODone  (DESYREL ) 150 MG tablet, Take 1 tablet (150 mg total) by mouth nightly., Disp: 90 tablet, Rfl: 3    trospium  (SANCTURA ) 20 mg tablet, Take 1 tablet (20 mg total) by mouth two (2) times a day., Disp: 180 tablet, Rfl: 2    TRUE METRIX GLUCOSE TEST STRIP Strp, CHECK BLOOD SUGAR AS DIRECTED ONCE A DAY AND FOR SYMPTOMS OF HIGH OR LOW BLOOD SUGAR., Disp: 100 strip, Rfl: 3    TRUEPLUS LANCETS 33 gauge Misc, TEST BLOOD SUGAR EVERY DAY, Disp: 100 each, Rfl: 3
